# Patient Record
Sex: Female | Born: 1980
Health system: Southern US, Community
[De-identification: ages and names within clinical notes are randomized; demographics above are authoritative.]

## PROBLEM LIST (undated history)

## (undated) DIAGNOSIS — K602 Anal fissure, unspecified: Secondary | ICD-10-CM

## (undated) DIAGNOSIS — I82409 Acute embolism and thrombosis of unspecified deep veins of unspecified lower extremity: Secondary | ICD-10-CM

## (undated) DIAGNOSIS — Z923 Personal history of irradiation: Secondary | ICD-10-CM

## (undated) DIAGNOSIS — C50919 Malignant neoplasm of unspecified site of unspecified female breast: Secondary | ICD-10-CM

## (undated) DIAGNOSIS — F419 Anxiety disorder, unspecified: Secondary | ICD-10-CM

## (undated) HISTORY — DX: Anxiety disorder, unspecified: F41.9

## (undated) HISTORY — PX: NO PAST SURGERIES: SHX2092

## (undated) HISTORY — DX: Anal fissure, unspecified: K60.2

## (undated) HISTORY — PX: BREAST LUMPECTOMY: SHX2

## (undated) HISTORY — DX: Malignant neoplasm of unspecified site of unspecified female breast: C50.919

---

## 2002-05-19 ENCOUNTER — Emergency Department (HOSPITAL_COMMUNITY): Admission: EM | Admit: 2002-05-19 | Discharge: 2002-05-19 | Payer: Self-pay | Admitting: Emergency Medicine

## 2002-05-19 ENCOUNTER — Encounter: Payer: Self-pay | Admitting: Emergency Medicine

## 2006-05-26 ENCOUNTER — Emergency Department (HOSPITAL_COMMUNITY): Admission: EM | Admit: 2006-05-26 | Discharge: 2006-05-26 | Payer: Self-pay | Admitting: Emergency Medicine

## 2009-04-02 ENCOUNTER — Encounter: Admission: RE | Admit: 2009-04-02 | Discharge: 2009-04-02 | Payer: Self-pay | Admitting: Family Medicine

## 2010-11-07 ENCOUNTER — Ambulatory Visit (HOSPITAL_COMMUNITY)
Admission: EM | Admit: 2010-11-07 | Discharge: 2010-11-07 | Disposition: A | Discharge status: Home / Self Care | Payer: Federal, State, Local not specified - PPO | Source: Ambulatory Visit | Attending: Family Medicine | Admitting: Family Medicine

## 2010-11-07 DIAGNOSIS — M79609 Pain in unspecified limb: Secondary | ICD-10-CM | POA: Insufficient documentation

## 2010-11-09 ENCOUNTER — Encounter (HOSPITAL_COMMUNITY): Payer: Federal, State, Local not specified - PPO

## 2012-05-15 ENCOUNTER — Other Ambulatory Visit: Payer: Self-pay | Admitting: Internal Medicine

## 2012-05-20 ENCOUNTER — Ambulatory Visit (INDEPENDENT_AMBULATORY_CARE_PROVIDER_SITE_OTHER): Payer: Federal, State, Local not specified - PPO | Admitting: Family Medicine

## 2012-05-20 ENCOUNTER — Ambulatory Visit: Payer: Federal, State, Local not specified - PPO

## 2012-05-20 VITALS — BP 110/72 | HR 89 | Temp 97.9°F | Resp 17 | Ht 62.0 in | Wt 170.0 lb

## 2012-05-20 DIAGNOSIS — R05 Cough: Secondary | ICD-10-CM

## 2012-05-20 DIAGNOSIS — R059 Cough, unspecified: Secondary | ICD-10-CM

## 2012-05-20 DIAGNOSIS — J40 Bronchitis, not specified as acute or chronic: Secondary | ICD-10-CM

## 2012-05-20 DIAGNOSIS — R0602 Shortness of breath: Secondary | ICD-10-CM

## 2012-05-20 DIAGNOSIS — J040 Acute laryngitis: Secondary | ICD-10-CM

## 2012-05-20 LAB — POCT CBC
Granulocyte percent: 58.9 %G (ref 37–80)
HCT, POC: 44.6 % (ref 37.7–47.9)
Hemoglobin: 13.9 g/dL (ref 12.2–16.2)
Lymph, poc: 4.8 — AB (ref 0.6–3.4)
MCH, POC: 28.5 pg (ref 27–31.2)
MCHC: 31.2 g/dL — AB (ref 31.8–35.4)
MCV: 91.3 fL (ref 80–97)
MID (cbc): 0.8 (ref 0–0.9)
MPV: 8.1 fL (ref 0–99.8)
POC Granulocyte: 8 — AB (ref 2–6.9)
POC LYMPH PERCENT: 35 %L (ref 10–50)
POC MID %: 6.1 %M (ref 0–12)
Platelet Count, POC: 520 10*3/uL — AB (ref 142–424)
RBC: 4.88 M/uL (ref 4.04–5.48)
RDW, POC: 13.8 %
WBC: 13.6 10*3/uL — AB (ref 4.6–10.2)

## 2012-05-20 LAB — D-DIMER, QUANTITATIVE: D-Dimer, Quant: 0.27 ug/mL-FEU (ref 0.00–0.48)

## 2012-05-20 MED ORDER — AZITHROMYCIN 500 MG PO TABS: 500.0000 mg | ORAL_TABLET | Freq: Every day | ORAL | Status: DC

## 2012-05-20 MED ORDER — HYDROCODONE-HOMATROPINE 5-1.5 MG/5ML PO SYRP: ORAL_SOLUTION | ORAL | Status: DC

## 2012-05-20 MED ORDER — ALBUTEROL SULFATE HFA 108 (90 BASE) MCG/ACT IN AERS: 2.0000 | INHALATION_SPRAY | RESPIRATORY_TRACT | Status: DC | PRN

## 2012-05-20 NOTE — Progress Notes (Signed)
Patient ID: Cheyenne [name] MRN: [id number], DOB: 12-11-1980, 31 y.o. Date of Encounter: 05/20/2012, 10:31 AM  Primary Physician: No primary provider on file.  Chief Complaint: URI, cough, and nasal congestion for three days  HPI: 31 y.o. year old female with history below presents with three day history of nasal congestion, laryngitis, and cough. Afebrile, no chills through out. Patient has recently flown to and from [facility] with each flight lasting about 2.5 hours prior to the development of her above symptoms. While she was in [facility] walking around she noticed that she was short of breath. She did not seek evaluation for this while there. Since returning from her trip her shortness of breath has improved. Her cough is mostly nonproductive, but sounds productive. It is worse at night when she lays down. She denies any wheezing, dyspnea, chest pain, palpitations, tachycardia, or edema. She did lose her voice several days prior, then this improved, but has once again decreased. She states that her worst symptoms are her decreased voice and cough.   Of note, in April 2012 she was evaluated in our office for a "knot of her right ankle and calf." At this time she was found to have a D-dimer of 2.12, and an LE doppler that indicated superficial thrombosis in the greater saphenous vein, beginning at the ankle and extending into the popliteal fossa. She was treated with ASA 81 mg. She does mention to me for the past 2 weeks, which is prior to her plane travel, that her right calf has been sore. No injury or trauma. No swelling or erythema. She does take OCP daily. She does have a remote history of smoking tobacco in her late teens until age 69, when she would smoke socially. She has not smoked since. No known history of cancer or surgical procedures.  Reviewed hypercoagulation panel done in 2012 that did not show identifiable cause.     No past medical history on file.   Home Meds: Prior to  Admission medications   Medication Sig Start Date End Date Taking? Authorizing Provider  guaiFENesin (MUCINEX) 600 MG 12 hr tablet Take 1,200 mg by mouth 2 (two) times daily.   Yes Historical Provider, MD  guaiFENesin (ROBITUSSIN) 100 MG/5ML liquid Take 200 mg by mouth 3 (three) times daily as needed.   Yes Historical Provider, MD  OCP         Allergies: No Known Allergies  History   Social History  . Marital Status: Single    Spouse Name: N/A    Number of Children: N/A  . Years of Education: N/A   Occupational History  . Not on file.   Social History Main Topics  . Smoking status: Never Smoker   . Smokeless tobacco: Not on file  . Alcohol Use: Not on file  . Drug Use: Not on file  . Sexually Active: Not on file   Other Topics Concern  . Not on file   Social History Narrative  . No narrative on file     Review of Systems: Constitutional: negative for chills, fever, night sweats, weight changes, or fatigue  HEENT: negative for vision changes, or hearing loss Cardiovascular: negative for chest pain or palpitations Respiratory: negative for hemoptysis, dyspnea, or wheezing Abdominal: negative for abdominal pain, nausea, vomiting, diarrhea, or constipation Dermatological: negative for rash Neurologic: negative for headache, dizziness, or syncope All other systems reviewed and are otherwise negative with the exception to those above and in the HPI.   Physical  Exam: Blood pressure 110/72, pulse 89, temperature 97.9 F (36.6 C), temperature source Oral, resp. rate 17, height 5\' 2"  (1.575 m), weight 170 lb (77.111 kg), SpO2 99.00%., Body mass index is 31.09 kg/(m^2). General: Well developed, well nourished, in no acute distress. Head: Normocephalic, atraumatic, eyes without discharge, sclera non-icteric, nares are without discharge. Bilateral auditory canals clear, TM's are without perforation, pearly grey and translucent with reflective cone of light bilaterally. Bilateral  maxillary sinus TTP. Oral cavity moist, posterior pharynx without exudate, erythema, peritonsillar abscess, or post nasal drip. Uvula midline.  Neck: Supple. No thyromegaly. Full ROM. No lymphadenopathy. Lungs: Clear bilaterally to auscultation without wheezes, rales, or rhonchi. Breathing is unlabored. Heart: RRR with S1 S2. No murmurs, rubs, or gallops appreciated. Msk:  Strength and tone normal for age. Extremities/Skin: Bilateral calves 41 cm 7 cm below patella. Bilateral calves supple. No cording. No erythema. Warm and dry. No clubbing or cyanosis. No edema. No rashes or suspicious lesions. Neuro: Alert and oriented X 3. Moves all extremities spontaneously. Gait is normal. CNII-XII grossly in tact. Psych:  Responds to questions appropriately with a normal affect.   Labs: Results for orders placed in visit on 05/20/12  POCT CBC      Component Value Range   WBC 13.6 (*) 4.6 - 10.2 K/uL   Lymph, poc 4.8 (*) 0.6 - 3.4   POC LYMPH PERCENT 35.0  10 - 50 %L   MID (cbc) 0.8  0 - 0.9   POC MID % 6.1  0 - 12 %M   POC Granulocyte 8.0 (*) 2 - 6.9   Granulocyte percent 58.9  37 - 80 %G   RBC 4.88  4.04 - 5.48 M/uL   Hemoglobin 13.9  12.2 - 16.2 g/dL   HCT, POC 96.0  45.4 - 47.9 %   MCV 91.3  80 - 97 fL   MCH, POC 28.5  27 - 31.2 pg   MCHC 31.2 (*) 31.8 - 35.4 g/dL   RDW, POC 09.8     Platelet Count, POC 520 (*) 142 - 424 K/uL   MPV 8.1  0 - 99.8 fL   D-dimer STAT with call report.  [facility] reading (PRIMARY) by  Dr. Patsy Lager. Negative   ASSESSMENT AND PLAN:  31 y.o. year old female with bronchitis, cough, and laryngitis. Rule out for DVT/PE. 1. Bronchitis/cough/laryngitis -Azithromycin 500 mg 1 po daily #5 no RF -Hycodan #4oz 1 tsp po q 4-6 hours prn cough no RF SED -Proventil 2 puffs inhaled q 4-6 hours prn #1 RF 1 -Voice rest -Mucinex -Rest  -Fluids -RTC precautions  2. Rule out DVT/PE -Check STAT D-dimer -If elevated patient will need to go the the emergency room for lower  extremity ultrasound/dopplers and chest CT. -She has been given RTC/ER/911 precautions -STAT D-dimer 0.27, gave patient results. She will continue above treatment plan. She has been given 911 precautions.   3. Patient discussed with Dr. Patsy Lager. Agrees with the above assessment and plan.    Signed, Eula Listen, PA-C 05/20/2012 10:31 AM

## 2012-10-02 ENCOUNTER — Telehealth: Payer: Self-pay

## 2012-10-02 NOTE — Telephone Encounter (Signed)
The following email was submitted to your website from [facility] I was prescribed medication for my bronchitis last time I vistited your office.  The doctor had told me if I needed a refill then I could just call and he could do it without me having to come back in.  Well I'm having trouble with my bronchitis again can I get this refilled?  [facility] [address] if he need to call it in. My telephone number is [phone]. Thanks in Advance.  Date of birth: Nov 26, 1980

## 2012-10-02 NOTE — Telephone Encounter (Signed)
Patient advised to try Mucinex bid for the cough for couple days and if this is not helpful she is to return to clinic.

## 2012-10-04 ENCOUNTER — Ambulatory Visit (INDEPENDENT_AMBULATORY_CARE_PROVIDER_SITE_OTHER): Payer: Federal, State, Local not specified - PPO | Admitting: Physician Assistant

## 2012-10-04 VITALS — BP 122/72 | HR 91 | Temp 98.4°F | Resp 16 | Ht 61.5 in | Wt 180.8 lb

## 2012-10-04 DIAGNOSIS — R062 Wheezing: Secondary | ICD-10-CM

## 2012-10-04 DIAGNOSIS — J209 Acute bronchitis, unspecified: Secondary | ICD-10-CM

## 2012-10-04 MED ORDER — AZITHROMYCIN 250 MG PO TABS: ORAL_TABLET | ORAL | Status: DC

## 2012-10-04 MED ORDER — PREDNISONE 20 MG PO TABS: ORAL_TABLET | ORAL | Status: DC

## 2012-10-04 MED ORDER — FLUCONAZOLE 150 MG PO TABS: 150.0000 mg | ORAL_TABLET | Freq: Once | ORAL | Status: DC

## 2012-10-04 MED ORDER — ALBUTEROL SULFATE (2.5 MG/3ML) 0.083% IN NEBU
2.5000 mg | INHALATION_SOLUTION | Freq: Once | RESPIRATORY_TRACT | Status: AC
  Administered 2012-10-04: 2.5 mg via RESPIRATORY_TRACT

## 2012-10-04 NOTE — Progress Notes (Signed)
  Subjective:    Patient ID: Cheyenne [name], female    DOB: May 11, 1981, 32 y.o.   MRN: [id number]  HPI 32 year old female presents with 1 week history of cough, nasal congestion, wheezing, and some SOB during coughing fits.  States she has a history of "bronchitis" and uses albuterol as needed but has not diagnosis of asthma.  She has been using her albuterol 1-2 times daily. This does seem to help slightly.  Denies fever, chills, nausea, vomiting, sore throat, otalgia, headache, or dizziness.   She is otherwise healthy with no other concerns today.      Review of Systems  Constitutional: Negative for fever and chills.  HENT: Positive for congestion and rhinorrhea. Negative for ear pain, sore throat and sinus pressure.   Respiratory: Positive for cough and wheezing. Negative for chest tightness.   Cardiovascular: Negative for chest pain.  Gastrointestinal: Negative for nausea, vomiting and abdominal pain.  Allergic/Immunologic: Positive for environmental allergies (pollen).  Neurological: Negative for dizziness and headaches.       Objective:   Physical Exam  Constitutional: She is oriented to person, place, and time. She appears well-developed and well-nourished.  HENT:  Head: Normocephalic and atraumatic.  Right Ear: Hearing, tympanic membrane, external ear and ear canal normal.  Left Ear: Hearing, tympanic membrane, external ear and ear canal normal.  Mouth/Throat: Uvula is midline, oropharynx is clear and moist and mucous membranes are normal. No oropharyngeal exudate.  Eyes: Conjunctivae are normal.  Neck: Normal range of motion. Neck supple.  Cardiovascular: Normal rate, regular rhythm and normal heart sounds.   Pulmonary/Chest: Effort normal. She has wheezes (slight wheezes).  Lymphadenopathy:    She has no cervical adenopathy.  Neurological: She is alert and oriented to person, place, and time.  Psychiatric: She has a normal mood and affect. Her behavior is normal.  Judgment and thought content normal.          Assessment & Plan:  1. Acute bronchitis - Plan: predniSONE (DELTASONE) 20 MG tablet, azithromycin (ZITHROMAX) 250 MG tablet  -Will go ahead and cover with Zpack  -Zyrtec daily to help with drainage  2. Wheezing - Plan: albuterol (PROVENTIL) (2.5 MG/3ML) 0.083% nebulizer solution 2.5 mg  -Continue albuterol q4-6 hours prn wheezing  -If increased use of albuterol, RTC for further evaluation   -Follow up if symptoms worsen or fail to improve.

## 2013-04-09 ENCOUNTER — Other Ambulatory Visit: Payer: Self-pay | Admitting: Physician Assistant

## 2013-04-10 ENCOUNTER — Ambulatory Visit: Payer: Federal, State, Local not specified - PPO

## 2013-04-10 ENCOUNTER — Ambulatory Visit (INDEPENDENT_AMBULATORY_CARE_PROVIDER_SITE_OTHER): Payer: Federal, State, Local not specified - PPO | Admitting: Internal Medicine

## 2013-04-10 VITALS — BP 110/62 | HR 86 | Temp 98.4°F | Resp 18 | Ht 62.0 in | Wt 187.0 lb

## 2013-04-10 DIAGNOSIS — R0781 Pleurodynia: Secondary | ICD-10-CM

## 2013-04-10 DIAGNOSIS — R05 Cough: Secondary | ICD-10-CM

## 2013-04-10 DIAGNOSIS — R059 Cough, unspecified: Secondary | ICD-10-CM

## 2013-04-10 DIAGNOSIS — J209 Acute bronchitis, unspecified: Secondary | ICD-10-CM

## 2013-04-10 DIAGNOSIS — R071 Chest pain on breathing: Secondary | ICD-10-CM

## 2013-04-10 DIAGNOSIS — D72829 Elevated white blood cell count, unspecified: Secondary | ICD-10-CM

## 2013-04-10 LAB — POCT CBC
HCT, POC: 42.6 % (ref 37.7–47.9)
Hemoglobin: 13.7 g/dL (ref 12.2–16.2)
Lymph, poc: 5.3 — AB (ref 0.6–3.4)
MCH, POC: 28.8 pg (ref 27–31.2)
MCHC: 32.2 g/dL (ref 31.8–35.4)
MCV: 89.6 fL (ref 80–97)
MPV: 8.6 fL (ref 0–99.8)
RBC: 4.76 M/uL (ref 4.04–5.48)
WBC: 14.8 10*3/uL — AB (ref 4.6–10.2)

## 2013-04-10 MED ORDER — AZITHROMYCIN 250 MG PO TABS: ORAL_TABLET | ORAL | Status: DC

## 2013-04-10 MED ORDER — HYDROCODONE-ACETAMINOPHEN 7.5-325 MG/15ML PO SOLN: 5.0000 mL | Freq: Four times a day (QID) | ORAL | Status: DC | PRN

## 2013-04-10 MED ORDER — IBUPROFEN 600 MG PO TABS: 600.0000 mg | ORAL_TABLET | Freq: Three times a day (TID) | ORAL | Status: DC | PRN

## 2013-04-10 NOTE — Progress Notes (Signed)
  Subjective:    Patient ID: Cheyenne [name], female    DOB: 1981-06-14, 32 y.o.   MRN: [id number]  HPI    Review of Systems     Objective:   Physical Exam        Assessment & Plan:

## 2013-04-10 NOTE — Patient Instructions (Signed)
Pleurisy Pleurisy is an inflammation and swelling of the lining of the lungs. It usually is the result of an underlying infection or other disease. Because of this inflammation, it hurts to breathe. It is aggravated by coughing or deep breathing. The primary goal in treating pleurisy is to diagnose and treat the condition that caused it.  HOME CARE INSTRUCTIONS   Only take over-the-counter or prescription medicines for pain, discomfort, or fever as directed by your caregiver.  If medications which kill germs (antibiotics) were prescribed, take the entire course. Even if you are feeling better, you need to take them.  Use a cool mist vaporizer to help loosen secretions. This is so the secretions can be coughed up more easily. SEEK MEDICAL CARE IF:   Your pain is not controlled with medication or is increasing.  You have an increase inpus like (purulent) secretions brought up with coughing. SEEK IMMEDIATE MEDICAL CARE IF:   You have blue or dark lips, fingernails, or toenails.  You begin coughing up blood.  You have increased difficulty breathing.  You have continuing pain unrelieved by medicine or lasting more than 1 week.  You have pain that radiates into your neck, arms, or jaw.  You develop increased shortness of breath or wheezing.  You develop a fever, rash, vomiting, fainting, or other serious complaints. Document Released: 07/19/2005 Document Revised: 10/11/2011 Document Reviewed: 02/17/2007 [facility] Patient Information 2014 [place], [place]. Bronchitis Bronchitis is the body's way of reacting to injury and/or infection (inflammation) of the bronchi. Bronchi are the air tubes that extend from the windpipe into the lungs. If the inflammation becomes severe, it may cause shortness of breath. CAUSES  Inflammation may be caused by:  A virus.  Germs (bacteria).  Dust.  Allergens.  Pollutants and many other irritants. The cells lining the bronchial tree are covered with  tiny hairs (cilia). These constantly beat upward, away from the lungs, toward the mouth. This keeps the lungs free of pollutants. When these cells become too irritated and are unable to do their job, mucus begins to develop. This causes the characteristic cough of bronchitis. The cough clears the lungs when the cilia are unable to do their job. Without either of these protective mechanisms, the mucus would settle in the lungs. Then you would develop pneumonia. Smoking is a common cause of bronchitis and can contribute to pneumonia. Stopping this habit is the single most important thing you can do to help yourself. TREATMENT   Your caregiver may prescribe an antibiotic if the cough is caused by bacteria. Also, medicines that open up your airways make it easier to breathe. Your caregiver may also recommend or prescribe an expectorant. It will loosen the mucus to be coughed up. Only take over-the-counter or prescription medicines for pain, discomfort, or fever as directed by your caregiver.  Removing whatever causes the problem (smoking, for example) is critical to preventing the problem from getting worse.  Cough suppressants may be prescribed for relief of cough symptoms.  Inhaled medicines may be prescribed to help with symptoms now and to help prevent problems from returning.  For those with recurrent (chronic) bronchitis, there may be a need for steroid medicines. SEEK IMMEDIATE MEDICAL CARE IF:   During treatment, you develop more pus-like mucus (purulent sputum).  You have a fever.  Your baby is older than 3 months with a rectal temperature of 102 F (38.9 C) or higher.  Your baby is 52 months old or younger with a rectal temperature of 100.4 F (  38 C) or higher.  You become progressively more ill.  You have increased difficulty breathing, wheezing, or shortness of breath. It is necessary to seek immediate medical care if you are elderly or sick from any other disease. MAKE SURE YOU:     Understand these instructions.  Will watch your condition.  Will get help right away if you are not doing well or get worse. Document Released: 07/19/2005 Document Revised: 10/11/2011 Document Reviewed: 05/28/2008 [facility] Patient Information 2014 [place], [place].

## 2013-04-10 NOTE — Progress Notes (Signed)
  Subjective:    Patient ID: Cheyenne [name], female    DOB: [birth year], 32 y.o.   MRN: [id number]  HPI Patient presents with congestion in chest.  Complains of pain in left side of chest for two days. Coughing last night, minimal sputum No smoke, hx of bronchospasm. No sob, doe but pain with full breath.   Review of Systems     Objective:   Physical Exam  Vitals reviewed. Constitutional: She is oriented to person, place, and time. She appears well-developed and well-nourished. No distress.  HENT:  Head: Normocephalic.  Right Ear: External ear normal.  Left Ear: External ear normal.  Nose: Mucosal edema and rhinorrhea present.  Mouth/Throat: Oropharynx is clear and moist.  Eyes: EOM are normal.  Neck: Normal range of motion.  Cardiovascular: Normal rate and normal heart sounds.   Pulmonary/Chest: Effort normal and breath sounds normal. No respiratory distress. She has no wheezes. She has no rales. She exhibits tenderness.  Musculoskeletal: Normal range of motion.  Neurological: She is alert and oriented to person, place, and time. She exhibits normal muscle tone. Coordination normal.  Psychiatric: She has a normal mood and affect.     Results for orders placed in visit on 04/10/13  POCT CBC      Result Value Range   WBC 14.8 (*) 4.6 - 10.2 K/uL   Lymph, poc 5.3 (*) 0.6 - 3.4   POC LYMPH PERCENT 35.6  10 - 50 %L   MID (cbc) 0.9  0 - 0.9   POC MID % 6.1  0 - 12 %M   POC Granulocyte 8.6 (*) 2 - 6.9   Granulocyte percent 58.3  37 - 80 %G   RBC 4.76  4.04 - 5.48 M/uL   Hemoglobin 13.7  12.2 - 16.2 g/dL   HCT, POC 82.9  56.2 - 47.9 %   MCV 89.6  80 - 97 fL   MCH, POC 28.8  27 - 31.2 pg   MCHC 32.2  31.8 - 35.4 g/dL   RDW, POC 13.0     Platelet Count, POC 436 (*) 142 - 424 K/uL   MPV 8.6  0 - 99.8 fL   [facility] reading (PRIMARY) by  Dr Perrin Maltese cxr clear, no pneumothorax, no infiltrate.       Assessment & Plan:  Pleurisy/Bronchitis Zpak/Lortab elix/Ibuprofen

## 2013-04-16 ENCOUNTER — Telehealth: Payer: Self-pay

## 2013-04-16 NOTE — Telephone Encounter (Signed)
PT STATES SHE WOULD LIKE TO HAVE ANOTHER KIND OF MEDICATION CALLED IN FOR HER BRONCHITIS PLEASE CALL [phone]   [facility] [address]

## 2013-04-16 NOTE — Telephone Encounter (Signed)
Noted.  Forward to Dr. Perrin Maltese, [place] Childes.

## 2013-04-16 NOTE — Telephone Encounter (Signed)
Pleurisy/Bronchitis  Zpak/Lortab elix/Ibuprofen Called her, she states she is improving but still coughing. I have advised her the Zpack will continue to work, even though she has finished the course. I have encouraged her to continue the Mucinex. She has plenty of cough meds. She is encouraged to give this more time and to come in if worse. She agrees. To you FYI

## 2013-04-22 NOTE — Telephone Encounter (Signed)
rtc 

## 2013-09-17 ENCOUNTER — Ambulatory Visit (INDEPENDENT_AMBULATORY_CARE_PROVIDER_SITE_OTHER): Payer: Federal, State, Local not specified - PPO | Admitting: Physician Assistant

## 2013-09-17 VITALS — BP 100/62 | HR 98 | Temp 98.0°F | Resp 18 | Ht 61.0 in | Wt 182.0 lb

## 2013-09-17 DIAGNOSIS — R0602 Shortness of breath: Secondary | ICD-10-CM

## 2013-09-17 DIAGNOSIS — B9789 Other viral agents as the cause of diseases classified elsewhere: Principal | ICD-10-CM

## 2013-09-17 DIAGNOSIS — J40 Bronchitis, not specified as acute or chronic: Secondary | ICD-10-CM

## 2013-09-17 DIAGNOSIS — R05 Cough: Secondary | ICD-10-CM

## 2013-09-17 DIAGNOSIS — J069 Acute upper respiratory infection, unspecified: Secondary | ICD-10-CM

## 2013-09-17 DIAGNOSIS — R059 Cough, unspecified: Secondary | ICD-10-CM

## 2013-09-17 MED ORDER — ALBUTEROL SULFATE HFA 108 (90 BASE) MCG/ACT IN AERS: 2.0000 | INHALATION_SPRAY | RESPIRATORY_TRACT | Status: DC | PRN

## 2013-09-17 MED ORDER — HYDROCOD POLST-CHLORPHEN POLST 10-8 MG/5ML PO LQCR: 5.0000 mL | Freq: Two times a day (BID) | ORAL | Status: DC | PRN

## 2013-09-17 MED ORDER — MUCINEX DM MAXIMUM STRENGTH 60-1200 MG PO TB12: 1.0000 | ORAL_TABLET | Freq: Two times a day (BID) | ORAL | Status: DC

## 2013-09-17 NOTE — Progress Notes (Signed)
   Subjective:    Patient ID: Cheyenne [name], female    DOB: 21-Dec-1980, 33 y.o.   MRN: [id number]  HPI Patient reports a 4 day history of non-productive cough, chest congestion, shortness of breath, rhinorrhea, headache, chills, and fatigue.  She denies post nasal drip, fever, eye drainage, ear pain/pressure, sore throat, sinus pressure/pain.  She reports a history of bronchitis, usually gets it once or twice per year, last bout Sept 2014.  She denies a history of asthma but reports wheezing and SOB with respiratory illnesses, no problems otherwise.  She has been taking mucinex, hycodan and albuterol.  She has only taken the albuterol once and does not feel it helped.  She is taking the mucinex with sutafed.  She is out of hycodan   Review of Systems  Constitutional: Positive for chills and fatigue. Negative for fever and diaphoresis.  HENT: Positive for rhinorrhea. Negative for congestion, ear discharge, ear pain, postnasal drip, sinus pressure and sore throat.   Respiratory: Positive for cough (non-productive) and shortness of breath. Negative for chest tightness and wheezing.   Gastrointestinal: Negative.   Musculoskeletal: Negative for myalgias.  Neurological: Positive for headaches.      Objective:   Physical Exam  Constitutional: She is oriented to person, place, and time. She appears well-developed and well-nourished. No distress.  HENT:  Head: Normocephalic and atraumatic.  Right Ear: Tympanic membrane normal.  Left Ear: Tympanic membrane normal.  Nose: Mucosal edema and rhinorrhea present. Right sinus exhibits no maxillary sinus tenderness and no frontal sinus tenderness. Left sinus exhibits no maxillary sinus tenderness and no frontal sinus tenderness.  Mouth/Throat: Oropharynx is clear and moist.  Eyes: Conjunctivae and EOM are normal. Pupils are equal, round, and reactive to light.  Neck: Normal range of motion. Neck supple.  Cardiovascular: Normal rate, regular rhythm  and normal heart sounds.   Pulmonary/Chest: Effort normal and breath sounds normal. No accessory muscle usage. No respiratory distress. She has no wheezes. She has no rhonchi. She has no rales.  Lymphadenopathy:       Head (right side): No tonsillar, no preauricular, no posterior auricular and no occipital adenopathy present.       Head (left side): No tonsillar, no preauricular, no posterior auricular and no occipital adenopathy present.  Neurological: She is alert and oriented to person, place, and time.  Skin: Skin is warm and dry.  Psychiatric: She has a normal mood and affect. Her behavior is normal. Judgment and thought content normal.       Assessment & Plan:   1. Viral URI with cough -   - Dextromethorphan-Guaifenesin (MUCINEX DM MAXIMUM STRENGTH) 60-1200 MG TB12; Take 1 tablet by mouth every 12 (twelve) hours.  Dispense: 14 each; Refill: 1  2. Shortness of breath  - albuterol (PROVENTIL HFA;VENTOLIN HFA) 108 (90 BASE) MCG/ACT inhaler; Inhale 2 puffs into the lungs every 4 (four) hours as needed for wheezing.  Dispense: 1 Inhaler; Refill: 1  3. Cough  - chlorpheniramine-HYDROcodone (TUSSIONEX PENNKINETIC ER) 10-8 MG/5ML LQCR; Take 5 mLs by mouth every 12 (twelve) hours as needed for cough (cough).  Dispense: 70 mL; Refill: 0 - albuterol (PROVENTIL HFA;VENTOLIN HFA) 108 (90 BASE) MCG/ACT inhaler; Inhale 2 puffs into the lungs every 4 (four) hours as needed for wheezing.  Dispense: 1 Inhaler; Refill: 1  History of bronchitis and reactive airway disease.  No evidence at this time of infection.  Will treat symptomatically.

## 2013-09-18 ENCOUNTER — Telehealth: Payer: Self-pay

## 2013-09-18 NOTE — Progress Notes (Signed)
I was directly involved with the patient's care and agree with the physical, diagnosis and treatment plan.  She states that she is mainly here because she cannot sleep due to the cough and she ran out of her cough meds last pm.  I agree with symptomatic care unless her symptoms change and then she will contact the office for further instruction.

## 2013-09-18 NOTE — Telephone Encounter (Signed)
Patient needs a return to work note. States she would like to be at least through tomorrow.  [phone]

## 2013-09-19 NOTE — Telephone Encounter (Signed)
Spoke to patient, printed out of work note, mailing to home address per patient request.

## 2013-09-20 NOTE — Telephone Encounter (Signed)
Pt would now like for the note to be faxed. attn Mrs.Corinna Capra (time keeper will put this in her file) Fax: [phone]

## 2013-09-20 NOTE — Telephone Encounter (Signed)
Spoke to patient and informed her the work note had already been mailed to her home per her request,  she understood and said thank you.

## 2014-01-08 ENCOUNTER — Ambulatory Visit (INDEPENDENT_AMBULATORY_CARE_PROVIDER_SITE_OTHER): Payer: Federal, State, Local not specified - PPO | Admitting: Family Medicine

## 2014-01-08 VITALS — BP 120/72 | HR 100 | Temp 98.8°F | Resp 18 | Ht 61.5 in | Wt 187.6 lb

## 2014-01-08 DIAGNOSIS — L500 Allergic urticaria: Secondary | ICD-10-CM

## 2014-01-08 DIAGNOSIS — L259 Unspecified contact dermatitis, unspecified cause: Secondary | ICD-10-CM

## 2014-01-08 DIAGNOSIS — L239 Allergic contact dermatitis, unspecified cause: Secondary | ICD-10-CM

## 2014-01-08 MED ORDER — RANITIDINE HCL 150 MG PO TABS: 150.0000 mg | ORAL_TABLET | Freq: Two times a day (BID) | ORAL | Status: DC

## 2014-01-08 MED ORDER — HYDROXYZINE HCL 25 MG PO TABS: 12.5000 mg | ORAL_TABLET | Freq: Three times a day (TID) | ORAL | Status: DC | PRN

## 2014-01-08 MED ORDER — CETIRIZINE HCL 10 MG PO TABS: 10.0000 mg | ORAL_TABLET | Freq: Every day | ORAL | Status: DC

## 2014-01-08 NOTE — Progress Notes (Signed)
Subjective:   This chart was scribed for Delman Cheadle, MD, by Neta Ehlers, ED Scribe. This patient's care was started at 9:47 am.    Patient ID: Cheyenne [name], female    DOB: [birth year], 33 y.o.   MRN: [id number]  Chief Complaint  Patient presents with  . Dermatitis    arms and legs, redness and itching x 3 days    HPI  HPI Comments: Cheyenne [name] is a 33 y.o. female who presents to [facility] complaining of a rash which began approximately three days ago and which has been associated with itching. The rash is to her upper and lower extremities bilaterally. She has used both oral and topical Benadryl without resolution. The pt denies the rash to her back or neck. She reports she recently returned from a visit to [place], and she voices a h/o similar symptoms the last time she visited [place]; she stayed in a hotel in [place]. She used sun-block in [place] only to her extremities,  And she states she occasionally uses body wash to her extremities only.   Past Medical History  Diagnosis Date  . Anxiety     Current Outpatient Prescriptions on File Prior to Visit  Medication Sig Dispense Refill  . albuterol (PROVENTIL HFA;VENTOLIN HFA) 108 (90 BASE) MCG/ACT inhaler Inhale 2 puffs into the lungs every 4 (four) hours as needed for wheezing.  1 Inhaler  1  . guaiFENesin (MUCINEX) 600 MG 12 hr tablet Take 1,200 mg by mouth 2 (two) times daily.      Marland Kitchen ibuprofen (ADVIL,MOTRIN) 600 MG tablet Take 1 tablet (600 mg total) by mouth every 8 (eight) hours as needed for pain.  30 tablet  0  . PRESCRIPTION MEDICATION Birth control pill       No current facility-administered medications on file prior to visit.   No Known Allergies   Review of Systems  Constitutional: Negative for fever, chills and diaphoresis.  Musculoskeletal: Negative for arthralgias and joint swelling.  Skin: Positive for color change and rash. Negative for pallor and wound.  Allergic/Immunologic: Positive for environmental  allergies. Negative for food allergies and immunocompromised state.  Hematological: Negative for adenopathy. Does not bruise/bleed easily.  Psychiatric/Behavioral: Positive for sleep disturbance.   Triage Vitals: BP 120/72  Pulse 100  Temp(Src) 98.8 F (37.1 C) (Oral)  Resp 18  Ht 5' 1.5" (1.562 m)  Wt 187 lb 9.6 oz (85.095 kg)  BMI 34.88 kg/m2  SpO2 99%     Objective:   Physical Exam  Nursing note and vitals reviewed. Constitutional: She is oriented to person, place, and time. She appears well-developed and well-nourished. No distress.  HENT:  Head: Normocephalic and atraumatic.  Eyes: Conjunctivae and EOM are normal.  Neck: Normal range of motion. No tracheal deviation present.  Cardiovascular: Normal rate, regular rhythm, S1 normal, S2 normal, normal heart sounds and intact distal pulses.  Exam reveals no friction rub.   No murmur heard. Pulmonary/Chest: Effort normal and breath sounds normal. No respiratory distress. She has no decreased breath sounds. She has no wheezes. She has no rhonchi. She has no rales.  Musculoskeletal: Normal range of motion.  Neurological: She is alert and oriented to person, place, and time.  Skin: Skin is warm and dry. There is erythema.  Urticarial, erythematous papules most on left medial shin with multiple excoriations over anterior shins.  Blanching, macular, erythematous lesions, potentially urticarial, over right temple, chin, and neck.  Minimal lesions to anterior lower extremities.   Psychiatric: She has  a normal mood and affect. Her behavior is normal.      Assessment & Plan:   Informed pt to utilize below medications for a minimum of two weeks until the urticaria is completely resolved. Provided pt refills to start in case of recurrent. Also advised pt to use ice/topical Benadryl as needed.   Allergic urticaria  Allergic dermatitis  Meds ordered this encounter  Medications  . cetirizine (ZYRTEC) 10 MG tablet    Sig: Take 1 tablet  (10 mg total) by mouth daily.    Dispense:  30 tablet    Refill:  1  . ranitidine (ZANTAC) 150 MG tablet    Sig: Take 1 tablet (150 mg total) by mouth 2 (two) times daily.    Dispense:  60 tablet    Refill:  1  . hydrOXYzine (ATARAX/VISTARIL) 25 MG tablet    Sig: Take 0.5-1 tablets (12.5-25 mg total) by mouth every 8 (eight) hours as needed for itching.    Dispense:  60 tablet    Refill:  1    I personally performed the services described in this documentation, which was scribed in my presence. The recorded information has been reviewed and considered, and addended by me as needed.  Delman Cheadle, MD MPH

## 2014-01-08 NOTE — Patient Instructions (Signed)
Hives Hives are itchy, red, swollen areas of the skin. They can vary in size and location on your body. Hives can come and go for hours or several days (acute hives) or for several weeks (chronic hives). Hives do not spread from person to person (noncontagious). They may get worse with scratching, exercise, and emotional stress. CAUSES   Allergic reaction to food, additives, or drugs.  Infections, including the common cold.  Illness, such as vasculitis, lupus, or thyroid disease.  Exposure to sunlight, heat, or cold.  Exercise.  Stress.  Contact with chemicals. SYMPTOMS   Red or white swollen patches on the skin. The patches may change size, shape, and location quickly and repeatedly.  Itching.  Swelling of the hands, feet, and face. This may occur if hives develop deeper in the skin. DIAGNOSIS  Your caregiver can usually tell what is wrong by performing a physical exam. Skin or blood tests may also be done to determine the cause of your hives. In some cases, the cause cannot be determined. TREATMENT  Mild cases usually get better with medicines such as antihistamines. Severe cases may require an emergency epinephrine injection. If the cause of your hives is known, treatment includes avoiding that trigger.  HOME CARE INSTRUCTIONS   Avoid causes that trigger your hives.  Take antihistamines as directed by your caregiver to reduce the severity of your hives. Non-sedating or low-sedating antihistamines are usually recommended. Do not drive while taking an antihistamine.  Take any other medicines prescribed for itching as directed by your caregiver.  Wear loose-fitting clothing.  Keep all follow-up appointments as directed by your caregiver. SEEK MEDICAL CARE IF:   You have persistent or severe itching that is not relieved with medicine.  You have painful or swollen joints. SEEK IMMEDIATE MEDICAL CARE IF:   You have a fever.  Your tongue or lips are swollen.  You have  trouble breathing or swallowing.  You feel tightness in the throat or chest.  You have abdominal pain. These problems may be the first sign of a life-threatening allergic reaction. Call your local emergency services (911 in [place] MAKE SURE YOU:   Understand these instructions.  Will watch your condition.  Will get help right away if you are not doing well or get worse. Document Released: 07/19/2005 Document Revised: 01/18/2012 Document Reviewed: 10/12/2011 [facility] Patient Information 2014 [place]. Contact Dermatitis Contact dermatitis is a reaction to certain substances that touch the skin. Contact dermatitis can be either irritant contact dermatitis or allergic contact dermatitis. Irritant contact dermatitis does not require previous exposure to the substance for a reaction to occur.Allergic contact dermatitis only occurs if you have been exposed to the substance before. Upon a repeat exposure, your body reacts to the substance.  CAUSES  Many substances can cause contact dermatitis. Irritant dermatitis is most commonly caused by repeated exposure to mildly irritating substances, such as:  Makeup.  Soaps.  Detergents.  Bleaches.  Acids.  Metal salts, such as nickel. Allergic contact dermatitis is most commonly caused by exposure to:  Poisonous plants.  Chemicals (deodorants, shampoos).  Jewelry.  Latex.  Neomycin in triple antibiotic cream.  Preservatives in products, including clothing. SYMPTOMS  The area of skin that is exposed may develop:  Dryness or flaking.  Redness.  Cracks.  Itching.  Pain or a burning sensation.  Blisters. With allergic contact dermatitis, there may also be swelling in areas such as the eyelids, mouth, or genitals.  DIAGNOSIS  Your caregiver can usually tell what  the problem is by doing a physical exam. In cases where the cause is uncertain and an allergic contact dermatitis is suspected, a patch skin test may be  performed to help determine the cause of your dermatitis. TREATMENT Treatment includes protecting the skin from further contact with the irritating substance by avoiding that substance if possible. Barrier creams, powders, and gloves may be helpful. Your caregiver may also recommend:  Steroid creams or ointments applied 2 times daily. For best results, soak the rash area in cool water for 20 minutes. Then apply the medicine. Cover the area with a plastic wrap. You can store the steroid cream in the refrigerator for a "chilly" effect on your rash. That may decrease itching. Oral steroid medicines may be needed in more severe cases.  Antibiotics or antibacterial ointments if a skin infection is present.  Antihistamine lotion or an antihistamine taken by mouth to ease itching.  Lubricants to keep moisture in your skin.  Burow's solution to reduce redness and soreness or to dry a weeping rash. Mix one packet or tablet of solution in 2 cups cool water. Dip a clean washcloth in the mixture, wring it out a bit, and put it on the affected area. Leave the cloth in place for 30 minutes. Do this as often as possible throughout the day.  Taking several cornstarch or baking soda baths daily if the area is too large to cover with a washcloth. Harsh chemicals, such as alkalis or acids, can cause skin damage that is like a burn. You should flush your skin for 15 to 20 minutes with cold water after such an exposure. You should also seek immediate medical care after exposure. Bandages (dressings), antibiotics, and pain medicine may be needed for severely irritated skin.  HOME CARE INSTRUCTIONS  Avoid the substance that caused your reaction.  Keep the area of skin that is affected away from hot water, soap, sunlight, chemicals, acidic substances, or anything else that would irritate your skin.  Do not scratch the rash. Scratching may cause the rash to become infected.  You may take cool baths to help stop the  itching.  Only take over-the-counter or prescription medicines as directed by your caregiver.  See your caregiver for follow-up care as directed to make sure your skin is healing properly. SEEK MEDICAL CARE IF:   Your condition is not better after 3 days of treatment.  You seem to be getting worse.  You see signs of infection such as swelling, tenderness, redness, soreness, or warmth in the affected area.  You have any problems related to your medicines. Document Released: 07/16/2000 Document Revised: 10/11/2011 Document Reviewed: 12/22/2010 [facility] Patient Information 2014 [place], [place].

## 2014-03-14 ENCOUNTER — Ambulatory Visit (INDEPENDENT_AMBULATORY_CARE_PROVIDER_SITE_OTHER): Payer: Federal, State, Local not specified - PPO | Admitting: Family Medicine

## 2014-03-14 VITALS — BP 122/82 | HR 89 | Temp 98.5°F | Resp 18 | Ht 61.5 in | Wt 193.4 lb

## 2014-03-14 DIAGNOSIS — J0100 Acute maxillary sinusitis, unspecified: Secondary | ICD-10-CM

## 2014-03-14 DIAGNOSIS — J029 Acute pharyngitis, unspecified: Secondary | ICD-10-CM

## 2014-03-14 LAB — POCT RAPID STREP A (OFFICE): Rapid Strep A Screen: NEGATIVE

## 2014-03-14 MED ORDER — FLUCONAZOLE 150 MG PO TABS: 150.0000 mg | ORAL_TABLET | Freq: Once | ORAL | Status: DC

## 2014-03-14 MED ORDER — HYDROCOD POLST-CHLORPHEN POLST 10-8 MG/5ML PO LQCR: 5.0000 mL | Freq: Two times a day (BID) | ORAL | Status: DC | PRN

## 2014-03-14 MED ORDER — AMOXICILLIN 500 MG PO CAPS: 1000.0000 mg | ORAL_CAPSULE | Freq: Three times a day (TID) | ORAL | Status: DC

## 2014-03-14 MED ORDER — MAGIC MOUTHWASH W/LIDOCAINE: 10.0000 mL | ORAL | Status: DC | PRN

## 2014-03-14 NOTE — Progress Notes (Signed)
Subjective:  This chart was scribed for Delman Cheadle, MD by Ladene Artist, ED Scribe. The patient was seen in room 1. Patient's care was started at 10:22 AM.   Patient ID: Cheyenne [name], female    DOB: Nov 26, 1980, 33 y.o.   MRN: [id number]  Chief Complaint  Patient presents with  . Sore Throat    Says the back of her throat feels tight and swollen, since sunday night  . Cough   HPI HPI Comments  Cheyenne [name] is a 33 y.o. female who presents to the [facility] complaining of constant sore throat onset 4 days ago. Pt states that she returned from [place] 4 days ago before symptoms began. She reports associated dry cough, itchy throat, rhinorrhea, L upper gum pain prior to departure, HA that has resolved, R ear pain that has resolved, 1 episode of SOB 2 days ago. She denies fever, chills, sinus pressure, tinnitus. Pt reports sleep disturbances due to dental pain. She has tried Mucinex, Fast Relief sinus medication, Claritin with no relief. She has also tried hydrocodone cough syrup with temporary relief.   Dentist: [facility]; next appointment today, 03/14/14, at 4 PM for a follow-up  Past Medical History  Diagnosis Date  . Anxiety    Current Outpatient Prescriptions on File Prior to Visit  Medication Sig Dispense Refill  . albuterol (PROVENTIL HFA;VENTOLIN HFA) 108 (90 BASE) MCG/ACT inhaler Inhale 2 puffs into the lungs every 4 (four) hours as needed for wheezing.  1 Inhaler  1  . ibuprofen (ADVIL,MOTRIN) 600 MG tablet Take 1 tablet (600 mg total) by mouth every 8 (eight) hours as needed for pain.  30 tablet  0   No current facility-administered medications on file prior to visit.   No Known Allergies  Review of Systems  Constitutional: Negative for fever and chills.  HENT: Positive for dental problem, ear pain (resolved), rhinorrhea and sore throat. Negative for sinus pressure and tinnitus.   Respiratory: Positive for cough and shortness of breath.     Neurological: Positive for headaches (resolved).  Psychiatric/Behavioral: Positive for sleep disturbance.   Triage Vitals: BP 122/82  Pulse 89  Temp(Src) 98.5 F (36.9 C) (Oral)  Resp 18  Ht 5' 1.5" (1.562 m)  Wt 193 lb 6.4 oz (87.726 kg)  BMI 35.96 kg/m2  SpO2 98%    Objective:   Physical Exam  Nursing note and vitals reviewed. Constitutional: She is oriented to person, place, and time. She appears well-developed and well-nourished.  HENT:  Head: Normocephalic and atraumatic.  Nose: Mucosal edema present. Left sinus exhibits maxillary sinus tenderness (tender to palpation).  Mouth/Throat: No oropharyngeal exudate, posterior oropharyngeal edema or posterior oropharyngeal erythema.  Ears: TMs not visible due to cerumen Mouth/Throat: L tonsil at 2+, R at 1+  Eyes: Conjunctivae and EOM are normal.  Neck: Neck supple. No mass and no thyromegaly present.  Cardiovascular: Normal rate, regular rhythm, S1 normal, S2 normal and normal heart sounds.   Pulmonary/Chest: Effort normal and breath sounds normal.  Musculoskeletal: Normal range of motion.  Lymphadenopathy:       Head (right side): Submandibular adenopathy present. No preauricular and no posterior auricular adenopathy present.       Head (left side): Submandibular adenopathy present. No preauricular and no posterior auricular adenopathy present.    She has cervical adenopathy (anterior, bilaterally).       Right cervical: No posterior cervical adenopathy present.      Left cervical: No posterior cervical adenopathy  present.       Right: No supraclavicular adenopathy present.       Left: No supraclavicular adenopathy present.  Neurological: She is alert and oriented to person, place, and time.  Skin: Skin is warm and dry.  Psychiatric: She has a normal mood and affect. Her behavior is normal.   Results for orders placed in visit on 03/14/14  POCT RAPID STREP A (OFFICE)      Result Value Ref Range   Rapid Strep A Screen  Negative  Negative      Assessment & Plan:   Acute pharyngitis, unspecified pharyngitis type - Plan: POCT rapid strep A, Throat culture ([facility])  Acute maxillary sinusitis, recurrence not specified Does get yeast infections after antibiotics so requested a diflucan. Sent to pharmacy.   Meds ordered this encounter  Medications  . guaiFENesin (MUCINEX) 600 MG 12 hr tablet    Sig: Take by mouth 2 (two) times daily.  Marland Kitchen PRESCRIPTION MEDICATION    Sig: Birth control  . amoxicillin (AMOXIL) 500 MG capsule    Sig: Take 2 capsules (1,000 mg total) by mouth 3 (three) times daily.    Dispense:  60 capsule    Refill:  0  . chlorpheniramine-HYDROcodone (TUSSIONEX PENNKINETIC ER) 10-8 MG/5ML LQCR    Sig: Take 5 mLs by mouth every 12 (twelve) hours as needed.    Dispense:  120 mL    Refill:  0  . Alum & Mag Hydroxide-Simeth (MAGIC MOUTHWASH W/LIDOCAINE) SOLN    Sig: Take 10 mLs by mouth every 2 (two) hours as needed for mouth pain.    Dispense:  360 mL    Refill:  0    Use pharmacy formulary and mix 2:1 w/ viscous lidocaine, thanks.  . fluconazole (DIFLUCAN) 150 MG tablet    Sig: Take 1 tablet (150 mg total) by mouth once. After antibiotic course is complete. Repeat if needed if symptoms persist for >3d.    Dispense:  1 tablet    Refill:  1    I personally performed the services described in this documentation, which was scribed in my presence. The recorded information has been reviewed and considered, and addended by me as needed.  Delman Cheadle, MD MPH

## 2014-03-14 NOTE — Patient Instructions (Signed)

## 2014-03-16 LAB — CULTURE, GROUP A STREP: Organism ID, Bacteria: NORMAL

## 2014-03-18 ENCOUNTER — Telehealth: Payer: Self-pay

## 2014-03-18 NOTE — Telephone Encounter (Signed)
If the antibiotic is not working, that means that the infection is likely viral - an antibiotic will not help congestion at all - treats bacteria but will not help decrease the mucous at all. Esp since no bacteria was found in throat. If she is getting worse, may need a cortisone shot or a course of prednisone to improve swelling, pressure, congestion, pain but need to be seen in office for this since it can suppress the immune system.

## 2014-03-18 NOTE — Telephone Encounter (Signed)
Pt states her "sinus" area where the Dr. Lillette Boxer on her is swollen and sore. She states that she has more congestion in her nasal area. Her throat is better. She feels like she is getting worse instead of better. She will try Musinex today. She has not been using it since her visit here. She wants to see about using a different abx. She does not feel the one prescribed it working. Advised pt to give the abx a little time to work. She states she only has 5 days left and is not working. She wants something else called out.

## 2014-03-18 NOTE — Telephone Encounter (Signed)
Pt states she is not much better,L side of face hurts and is swollen,please advise   Best phone [address]

## 2014-03-19 NOTE — Telephone Encounter (Signed)
Spoke with pt. She will RTC 

## 2014-04-12 ENCOUNTER — Other Ambulatory Visit: Payer: Self-pay | Admitting: Obstetrics and Gynecology

## 2014-04-12 DIAGNOSIS — N63 Unspecified lump in unspecified breast: Secondary | ICD-10-CM

## 2014-04-16 ENCOUNTER — Ambulatory Visit
Admission: RE | Admit: 2014-04-16 | Discharge: 2014-04-16 | Disposition: A | Discharge status: Home / Self Care | Payer: Federal, State, Local not specified - PPO | Source: Ambulatory Visit | Attending: Obstetrics and Gynecology | Admitting: Obstetrics and Gynecology

## 2014-04-16 ENCOUNTER — Encounter (INDEPENDENT_AMBULATORY_CARE_PROVIDER_SITE_OTHER): Payer: Self-pay

## 2014-04-16 DIAGNOSIS — N63 Unspecified lump in unspecified breast: Secondary | ICD-10-CM

## 2014-08-15 ENCOUNTER — Ambulatory Visit (INDEPENDENT_AMBULATORY_CARE_PROVIDER_SITE_OTHER): Payer: Federal, State, Local not specified - PPO | Admitting: Family Medicine

## 2014-08-15 ENCOUNTER — Ambulatory Visit (INDEPENDENT_AMBULATORY_CARE_PROVIDER_SITE_OTHER): Payer: Federal, State, Local not specified - PPO

## 2014-08-15 VITALS — BP 112/60 | HR 79 | Temp 98.6°F | Resp 16 | Ht 61.5 in | Wt 197.0 lb

## 2014-08-15 DIAGNOSIS — S90122A Contusion of left lesser toe(s) without damage to nail, initial encounter: Secondary | ICD-10-CM

## 2014-08-15 DIAGNOSIS — M79675 Pain in left toe(s): Secondary | ICD-10-CM

## 2014-08-15 NOTE — Progress Notes (Signed)
[facility] [address], [place] [address] [phone]  Date:  08/15/2014   Name:  Cheyenne [name]   DOB:  1980-11-20   MRN:  [id number]  PCP:  No PCP Per Patient    Chief Complaint: Toe Pain   History of Present Illness:  Cheyenne [name] is a 34 y.o. very pleasant female patient who presents with the following:  She is here today with a right 4th toe injury that occurred a couple of days ago- she walked into a suitcase.  LMP- she uses continuouus OCP.    She is otherwise unhurt Wonders if the toe is broken- it is still sore  There are no active problems to display for this patient.   Past Medical History  Diagnosis Date  . Anxiety     History reviewed. No pertinent past surgical history.  History  Substance Use Topics  . Smoking status: Never Smoker   . Smokeless tobacco: Not on file  . Alcohol Use: 0.5 oz/week    1 drink(s) per week    Family History  Problem Relation Age of Onset  . Hypertension Mother     No Known Allergies  Medication list has been reviewed and updated.  Current Outpatient Prescriptions on File Prior to Visit  Medication Sig Dispense Refill  . albuterol (PROVENTIL HFA;VENTOLIN HFA) 108 (90 BASE) MCG/ACT inhaler Inhale 2 puffs into the lungs every 4 (four) hours as needed for wheezing. 1 Inhaler 1  . fluconazole (DIFLUCAN) 150 MG tablet Take 1 tablet (150 mg total) by mouth once. After antibiotic course is complete. Repeat if needed if symptoms persist for >3d. 1 tablet 1  . guaiFENesin (MUCINEX) 600 MG 12 hr tablet Take by mouth 2 (two) times daily.    Marland Kitchen ibuprofen (ADVIL,MOTRIN) 600 MG tablet Take 1 tablet (600 mg total) by mouth every 8 (eight) hours as needed for pain. 30 tablet 0  . PRESCRIPTION MEDICATION Birth control    . Alum & Mag Hydroxide-Simeth (MAGIC MOUTHWASH W/LIDOCAINE) SOLN Take 10 mLs by mouth every 2 (two) hours as needed for mouth pain. (Patient not taking: Reported on 08/15/2014) 360 mL 0  .  amoxicillin (AMOXIL) 500 MG capsule Take 2 capsules (1,000 mg total) by mouth 3 (three) times daily. (Patient not taking: Reported on 08/15/2014) 60 capsule 0  . chlorpheniramine-HYDROcodone (TUSSIONEX PENNKINETIC ER) 10-8 MG/5ML LQCR Take 5 mLs by mouth every 12 (twelve) hours as needed. (Patient not taking: Reported on 08/15/2014) 120 mL 0   No current facility-administered medications on file prior to visit.    Review of Systems:  As per HPI- otherwise negative.   Physical Examination: Filed Vitals:   08/15/14 1046  BP: 112/60  Pulse: 79  Temp: 98.6 F (37 C)  Resp: 16   Filed Vitals:   08/15/14 1046  Height: 5' 1.5" (1.562 m)  Weight: 197 lb (89.359 kg)   Body mass index is 36.62 kg/(m^2). Ideal Body Weight: Weight in (lb) to have BMI = 25: 134.2  GEN: WDWN, NAD, Non-toxic, A & O x 3, obese, looks well HEENT: Atraumatic, Normocephalic. Neck supple. No masses, No LAD. Ears and Nose: No external deformity. CV: RRR, No M/G/R. No JVD. No thrill. No extra heart sounds. PULM: CTA B, no wheezes, crackles, rhonchi. No retractions. No resp. distress. No accessory muscle use. EXTR: No c/c/e NEURO Normal gait.  PSYCH: Normally interactive. Conversant. Not depressed or anxious appearing.  Calm demeanor.  Left foot:left 4th toe is bruised and  tender. Mild tenderness over dorsum of foot.  Toe is NV intact  [facility] reading (PRIMARY) by  Dr. Lorelei Pont. Left foot: possible non- displaced fracture of distal phalanx of 4th toe.    LEFT FOOT - COMPLETE 3+ VIEW  COMPARISON: None.  FINDINGS: There is an oblique, nondisplaced fracture through the base of the fourth toe distal phalanx which involves the articular surface. There is no dislocation. Bone mineralization appears normal. Joint spaces are preserved. No radiopaque foreign body.  IMPRESSION: Nondisplaced fracture of the fourth toe distal phalanx.  Buddy taped 3rd and 4th toes  Assessment and Plan: Pain in toe of left foot -  Plan: DG Foot Complete Left  Contusion, toe, left, initial encounter - Plan: DG Foot Complete Left  Possible non- displaced fracture of distal left 4the toe.  Buddy taped and placed in post-op shoe.  I will give her a call with OR report.  Called around 4:45 pm- indeed there is a fracture.  No change to plan. She will follow- up if pain not better in the next 2-3 weeks- Sooner if worse.     Signed Lamar Blinks, MD

## 2014-08-15 NOTE — Patient Instructions (Signed)
I will call with your x-ray reportt.  There may be a mild break in your toe.  "buddy tape" it to the next toe and wear a supportive shoe or your velcro shoe Let me know if your toe is not less painful soon!

## 2014-12-24 ENCOUNTER — Ambulatory Visit (INDEPENDENT_AMBULATORY_CARE_PROVIDER_SITE_OTHER): Payer: Federal, State, Local not specified - PPO | Admitting: Physician Assistant

## 2014-12-24 ENCOUNTER — Encounter: Payer: Self-pay | Admitting: Physician Assistant

## 2014-12-24 VITALS — BP 112/80 | HR 87 | Temp 98.8°F | Resp 16 | Ht 61.25 in | Wt 210.0 lb

## 2014-12-24 DIAGNOSIS — Z23 Encounter for immunization: Secondary | ICD-10-CM | POA: Diagnosis not present

## 2014-12-24 DIAGNOSIS — Z1329 Encounter for screening for other suspected endocrine disorder: Secondary | ICD-10-CM | POA: Diagnosis not present

## 2014-12-24 DIAGNOSIS — Z1389 Encounter for screening for other disorder: Secondary | ICD-10-CM

## 2014-12-24 DIAGNOSIS — R0602 Shortness of breath: Secondary | ICD-10-CM | POA: Diagnosis not present

## 2014-12-24 DIAGNOSIS — R05 Cough: Secondary | ICD-10-CM

## 2014-12-24 DIAGNOSIS — Z0000 Encounter for general adult medical examination without abnormal findings: Secondary | ICD-10-CM | POA: Diagnosis not present

## 2014-12-24 DIAGNOSIS — F411 Generalized anxiety disorder: Secondary | ICD-10-CM | POA: Diagnosis not present

## 2014-12-24 DIAGNOSIS — R059 Cough, unspecified: Secondary | ICD-10-CM

## 2014-12-24 DIAGNOSIS — Z130 Encounter for screening for diseases of the blood and blood-forming organs and certain disorders involving the immune mechanism: Secondary | ICD-10-CM | POA: Diagnosis not present

## 2014-12-24 DIAGNOSIS — Z13220 Encounter for screening for lipoid disorders: Secondary | ICD-10-CM

## 2014-12-24 DIAGNOSIS — R062 Wheezing: Secondary | ICD-10-CM

## 2014-12-24 DIAGNOSIS — R3915 Urgency of urination: Secondary | ICD-10-CM

## 2014-12-24 LAB — POCT URINALYSIS DIPSTICK
BILIRUBIN UA: NEGATIVE
Glucose, UA: NEGATIVE
Ketones, UA: NEGATIVE
Leukocytes, UA: NEGATIVE
Nitrite, UA: NEGATIVE
PROTEIN UA: 100
Spec Grav, UA: 1.025
UROBILINOGEN UA: 0.2
pH, UA: 6

## 2014-12-24 LAB — POCT UA - MICROSCOPIC ONLY
Casts, Ur, LPF, POC: NEGATIVE
Crystals, Ur, HPF, POC: NEGATIVE
Epithelial cells, urine per micros: NEGATIVE
Mucus, UA: NEGATIVE
Yeast, UA: NEGATIVE

## 2014-12-24 LAB — COMPREHENSIVE METABOLIC PANEL
ALBUMIN: 3.8 g/dL (ref 3.5–5.2)
ALT: 18 U/L (ref 0–35)
AST: 16 U/L (ref 0–37)
Alkaline Phosphatase: 62 U/L (ref 39–117)
BUN: 15 mg/dL (ref 6–23)
CHLORIDE: 105 meq/L (ref 96–112)
CO2: 23 mEq/L (ref 19–32)
CREATININE: 0.83 mg/dL (ref 0.50–1.10)
Calcium: 8.6 mg/dL (ref 8.4–10.5)
GLUCOSE: 96 mg/dL (ref 70–99)
Potassium: 4.3 mEq/L (ref 3.5–5.3)
SODIUM: 138 meq/L (ref 135–145)
TOTAL PROTEIN: 6.6 g/dL (ref 6.0–8.3)
Total Bilirubin: 0.4 mg/dL (ref 0.2–1.2)

## 2014-12-24 LAB — CBC
HCT: 39.2 % (ref 36.0–46.0)
HEMOGLOBIN: 12.6 g/dL (ref 12.0–15.0)
MCH: 27.3 pg (ref 26.0–34.0)
MCHC: 32.1 g/dL (ref 30.0–36.0)
MCV: 84.8 fL (ref 78.0–100.0)
MPV: 9.3 fL (ref 8.6–12.4)
PLATELETS: 433 10*3/uL — AB (ref 150–400)
RBC: 4.62 MIL/uL (ref 3.87–5.11)
RDW: 14 % (ref 11.5–15.5)
WBC: 11 10*3/uL — ABNORMAL HIGH (ref 4.0–10.5)

## 2014-12-24 LAB — LIPID PANEL
CHOLESTEROL: 192 mg/dL (ref 0–200)
HDL: 46 mg/dL (ref >=46)
LDL Cholesterol: 125 mg/dL — ABNORMAL HIGH (ref 0–99)
Total CHOL/HDL Ratio: 4.2 Ratio
Triglycerides: 104 mg/dL (ref <150)
VLDL: 21 mg/dL (ref 0–40)

## 2014-12-24 LAB — TSH: TSH: 2.501 u[IU]/mL (ref 0.350–4.500)

## 2014-12-24 MED ORDER — ALPRAZOLAM 0.5 MG PO TABS: 0.5000 mg | ORAL_TABLET | Freq: Every evening | ORAL | Status: DC | PRN

## 2014-12-24 MED ORDER — ALBUTEROL SULFATE HFA 108 (90 BASE) MCG/ACT IN AERS: 2.0000 | INHALATION_SPRAY | RESPIRATORY_TRACT | Status: DC | PRN

## 2014-12-24 NOTE — Progress Notes (Signed)
   Subjective:    Patient ID: Cheyenne [name], female    DOB: 07-19-1981, 34 y.o.   MRN: [id number]  HPI    Review of Systems  Constitutional: Positive for fatigue.  HENT: Negative.   Eyes: Positive for redness and itching.  Respiratory: Positive for shortness of breath.   Cardiovascular: Negative.   Gastrointestinal: Negative.   Endocrine: Negative.   Genitourinary: Negative.   Musculoskeletal: Negative.   Skin: Negative.   Allergic/Immunologic: Positive for environmental allergies.  Neurological: Negative.   Hematological: Negative.   Psychiatric/Behavioral: Negative.        Objective:   Physical Exam        Assessment & Plan:

## 2014-12-24 NOTE — Patient Instructions (Signed)
We are checking labs today and will be in touch with you with those results.  You received a TDap vaccine today. We checked your lung function today while you're not sick to establish a baseline.  We flushed out your left ear today.  I've refilled your xanax and albuterol today. Remember there are better benzo medications that are better for the long term than xanax.  Please come back to see Cheyenne [name] with any other issues.   Health Maintenance Adopting a healthy lifestyle and getting preventive care can go a long way to promote health and wellness. Talk with your health care provider about what schedule of regular examinations is right for you. This is a good chance for you to check in with your provider about disease prevention and staying healthy. In between checkups, there are plenty of things you can do on your own. Experts have done a lot of research about which lifestyle changes and preventive measures are most likely to keep you healthy. Ask your health care provider for more information. WEIGHT AND DIET  Eat a healthy diet  Be sure to include plenty of vegetables, fruits, low-fat dairy products, and lean protein.  Do not eat a lot of foods high in solid fats, added sugars, or salt.  Get regular exercise. This is one of the most important things you can do for your health.  Most adults should exercise for at least 150 minutes each week. The exercise should increase your heart rate and make you sweat (moderate-intensity exercise).  Most adults should also do strengthening exercises at least twice a week. This is in addition to the moderate-intensity exercise.  Maintain a healthy weight  Body mass index (BMI) is a measurement that can be used to identify possible weight problems. It estimates body fat based on height and weight. Your health care provider can help determine your BMI and help you achieve or maintain a healthy weight.  For females 40 years of age and older:   A BMI below  18.5 is considered underweight.  A BMI of 18.5 to 24.9 is normal.  A BMI of 25 to 29.9 is considered overweight.  A BMI of 30 and above is considered obese.  Watch levels of cholesterol and blood lipids  You should start having your blood tested for lipids and cholesterol at 33 years of age, then have this test every 5 years.  You may need to have your cholesterol levels checked more often if:  Your lipid or cholesterol levels are high.  You are older than 33 years of age.  You are at high risk for heart disease.  CANCER SCREENING   Lung Cancer  Lung cancer screening is recommended for adults 18-68 years old who are at high risk for lung cancer because of a history of smoking.  A yearly low-dose CT scan of the lungs is recommended for people who:  Currently smoke.  Have quit within the past 15 years.  Have at least a 30-pack-year history of smoking. A pack year is smoking an average of one pack of cigarettes a day for 1 year.  Yearly screening should continue until it has been 15 years since you quit.  Yearly screening should stop if you develop a health problem that would prevent you from having lung cancer treatment.  Breast Cancer  Practice breast self-awareness. This means understanding how your breasts normally appear and feel.  It also means doing regular breast self-exams. Let your health care provider know about any changes,  no matter how small.  If you are in your 20s or 30s, you should have a clinical breast exam (CBE) by a health care provider every 1-3 years as part of a regular health exam.  If you are 87 or older, have a CBE every year. Also consider having a breast X-ray (mammogram) every year.  If you have a family history of breast cancer, talk to your health care provider about genetic screening.  If you are at high risk for breast cancer, talk to your health care provider about having an MRI and a mammogram every year.  Breast cancer gene (BRCA)  assessment is recommended for women who have family members with BRCA-related cancers. BRCA-related cancers include:  Breast.  Ovarian.  Tubal.  Peritoneal cancers.  Results of the assessment will determine the need for genetic counseling and BRCA1 and BRCA2 testing. Cervical Cancer Routine pelvic examinations to screen for cervical cancer are no longer recommended for nonpregnant women who are considered low risk for cancer of the pelvic organs (ovaries, uterus, and vagina) and who do not have symptoms. A pelvic examination may be necessary if you have symptoms including those associated with pelvic infections. Ask your health care provider if a screening pelvic exam is right for you.   The Pap test is the screening test for cervical cancer for women who are considered at risk.  If you had a hysterectomy for a problem that was not cancer or a condition that could lead to cancer, then you no longer need Pap tests.  If you are older than 65 years, and you have had normal Pap tests for the past 10 years, you no longer need to have Pap tests.  If you have had past treatment for cervical cancer or a condition that could lead to cancer, you need Pap tests and screening for cancer for at least 20 years after your treatment.  If you no longer get a Pap test, assess your risk factors if they change (such as having a new sexual partner). This can affect whether you should start being screened again.  Some women have medical problems that increase their chance of getting cervical cancer. If this is the case for you, your health care provider may recommend more frequent screening and Pap tests.  The human papillomavirus (HPV) test is another test that may be used for cervical cancer screening. The HPV test looks for the virus that can cause cell changes in the cervix. The cells collected during the Pap test can be tested for HPV.  The HPV test can be used to screen women 64 years of age and older.  Getting tested for HPV can extend the interval between normal Pap tests from three to five years.  An HPV test also should be used to screen women of any age who have unclear Pap test results.  After 33 years of age, women should have HPV testing as often as Pap tests.  Colorectal Cancer  This type of cancer can be detected and often prevented.  Routine colorectal cancer screening usually begins at 33 years of age and continues through 33 years of age.  Your health care provider may recommend screening at an earlier age if you have risk factors for colon cancer.  Your health care provider may also recommend using home test kits to check for hidden blood in the stool.  A small camera at the end of a tube can be used to examine your colon directly (sigmoidoscopy or colonoscopy). This  is done to check for the earliest forms of colorectal cancer.  Routine screening usually begins at age 9.  Direct examination of the colon should be repeated every 5-10 years through 33 years of age. However, you may need to be screened more often if early forms of precancerous polyps or small growths are found. Skin Cancer  Check your skin from head to toe regularly.  Tell your health care provider about any new moles or changes in moles, especially if there is a change in a mole's shape or color.  Also tell your health care provider if you have a mole that is larger than the size of a pencil eraser.  Always use sunscreen. Apply sunscreen liberally and repeatedly throughout the day.  Protect yourself by wearing long sleeves, pants, a wide-brimmed hat, and sunglasses whenever you are outside. HEART DISEASE, DIABETES, AND HIGH BLOOD PRESSURE   Have your blood pressure checked at least every 1-2 years. High blood pressure causes heart disease and increases the risk of stroke.  If you are between 19 years and 34 years old, ask your health care provider if you should take aspirin to prevent  strokes.  Have regular diabetes screenings. This involves taking a blood sample to check your fasting blood sugar level.  If you are at a normal weight and have a low risk for diabetes, have this test once every three years after 33 years of age.  If you are overweight and have a high risk for diabetes, consider being tested at a younger age or more often. PREVENTING INFECTION  Hepatitis B  If you have a higher risk for hepatitis B, you should be screened for this virus. You are considered at high risk for hepatitis B if:  You were born in a country where hepatitis B is common. Ask your health care provider which countries are considered high risk.  Your parents were born in a high-risk country, and you have not been immunized against hepatitis B (hepatitis B vaccine).  You have HIV or AIDS.  You use needles to inject street drugs.  You live with someone who has hepatitis B.  You have had sex with someone who has hepatitis B.  You get hemodialysis treatment.  You take certain medicines for conditions, including cancer, organ transplantation, and autoimmune conditions. Hepatitis C  Blood testing is recommended for:  Everyone born from [birth year] through 1964.  Anyone with known risk factors for hepatitis C. Sexually transmitted infections (STIs)  You should be screened for sexually transmitted infections (STIs) including gonorrhea and chlamydia if:  You are sexually active and are younger than 33 years of age.  You are older than 33 years of age and your health care provider tells you that you are at risk for this type of infection.  Your sexual activity has changed since you were last screened and you are at an increased risk for chlamydia or gonorrhea. Ask your health care provider if you are at risk.  If you do not have HIV, but are at risk, it may be recommended that you take a prescription medicine daily to prevent HIV infection. This is called pre-exposure prophylaxis  (PrEP). You are considered at risk if:  You are sexually active and do not regularly use condoms or know the HIV status of your partner(s).  You take drugs by injection.  You are sexually active with a partner who has HIV. Talk with your health care provider about whether you are at high risk of being  infected with HIV. If you choose to begin PrEP, you should first be tested for HIV. You should then be tested every 3 months for as long as you are taking PrEP.  PREGNANCY   If you are premenopausal and you may become pregnant, ask your health care provider about preconception counseling.  If you may become pregnant, take 400 to 800 micrograms (mcg) of folic acid every day.  If you want to prevent pregnancy, talk to your health care provider about birth control (contraception). OSTEOPOROSIS AND MENOPAUSE   Osteoporosis is a disease in which the bones lose minerals and strength with aging. This can result in serious bone fractures. Your risk for osteoporosis can be identified using a bone density scan.  If you are 53 years of age or older, or if you are at risk for osteoporosis and fractures, ask your health care provider if you should be screened.  Ask your health care provider whether you should take a calcium or vitamin D supplement to lower your risk for osteoporosis.  Menopause may have certain physical symptoms and risks.  Hormone replacement therapy may reduce some of these symptoms and risks. Talk to your health care provider about whether hormone replacement therapy is right for you.  HOME CARE INSTRUCTIONS   Schedule regular health, dental, and eye exams.  Stay current with your immunizations.   Do not use any tobacco products including cigarettes, chewing tobacco, or electronic cigarettes.  If you are pregnant, do not drink alcohol.  If you are breastfeeding, limit how much and how often you drink alcohol.  Limit alcohol intake to no more than 1 drink per day for  nonpregnant women. One drink equals 12 ounces of beer, 5 ounces of wine, or 1 ounces of hard liquor.  Do not use street drugs.  Do not share needles.  Ask your health care provider for help if you need support or information about quitting drugs.  Tell your health care provider if you often feel depressed.  Tell your health care provider if you have ever been abused or do not feel safe at home. Document Released: 02/01/2011 Document Revised: 12/03/2013 Document Reviewed: 06/20/2013 [facility] Patient Information 2015 [place], [place]. This information is not intended to replace advice given to you by your health care provider. Make sure you discuss any questions you have with your health care provider.

## 2014-12-24 NOTE — Progress Notes (Signed)
Subjective:    Patient ID: Cheyenne [name], female    DOB: 1980/10/14, 34 y.o.   MRN: [id number]  Chief Complaint  Patient presents with  . Annual Exam    no pap  . Medication Refill    Albuterol inh   There are no active problems to display for this patient.  Prior to Admission medications   Medication Sig Start Date End Date Taking? Authorizing Provider  albuterol (PROVENTIL HFA;VENTOLIN HFA) 108 (90 BASE) MCG/ACT inhaler Inhale 2 puffs into the lungs every 4 (four) hours as needed for wheezing. 12/24/14  Yes Raelyn Ensign, PA  ALPRAZolam (XANAX) 0.5 MG tablet Take 1 tablet (0.5 mg total) by mouth at bedtime as needed for anxiety. 12/24/14  Yes Luisantonio Adinolfi, PA  desonide (DESOWEN) 0.05 % lotion Apply topically 2 (two) times daily.   Yes Historical Provider, MD  guaiFENesin (MUCINEX) 600 MG 12 hr tablet Take by mouth 2 (two) times daily.   Yes Historical Provider, MD  ibuprofen (ADVIL,MOTRIN) 600 MG tablet Take 1 tablet (600 mg total) by mouth every 8 (eight) hours as needed for pain. 04/10/13  Yes Jonita Albee, MD  Norethindrone-Eth Estradiol (NORTREL 0.5/35, 28, PO) Take by mouth.   Yes Historical Provider, MD   Medications, allergies, past medical history, surgical history, family history, social history and problem list reviewed and updated.  HPI  3 yof presents for cpe.   Dentist: Sees them 1-2 x year. Eye: Sees them yearly, wears eye glasses. Diet: No specific diet. Exercise: None. Due to lack of interest. Drugs, cigs: None. Alcohol: Socially.   Denies any issues, pains, complaints. She does mention slight increase urinary urgency while at work but thinks this may be due to her drinking lots of water/caffeine and not getting many bathroom breaks. Denies dysuria, freq.   Has hx freq bronchitis infxns. Has never been a smoker. Works indoors. She uses albuterol occasionally when has bronchitis attacks. Approx every 1-2 months. She does not have a known hx asthma. Does not feel  like she wheezes btwn bronchitis episodes.   She asks for xanax refill. Has been on for anxiety for several yrs. Initially prescribed from psych yrs ago. Has been being refilled by her gyn. Most of her anxiety is through her job at [employer]. Denies anxiety most days of the week, denies it affecting her daily life.   Unsure of last tdap vaccine.  Not due for pap, had normal pap earlier this year with her gyn. Had abn mass left breast with gyn --> normal mammo earlier this year per pt. Pt states she had neg std screening at gyn earlier this year, declines today.   Review of Systems Fatigue: states this has been present past few days since working in yard Eye redness, eye itching, seasonal allergies: Every year around this time. Takes claritin daily.  Otherwise negative per ros sheet.      Objective:   Physical Exam  Constitutional: She is oriented to person, place, and time. She appears well-developed and well-nourished.  Non-toxic appearance. She does not have a sickly appearance. She does not appear ill. No distress.  BP 112/80 mmHg  Pulse 87  Temp(Src) 98.8 F (37.1 C) (Oral)  Resp 16  Ht 5' 1.25" (1.556 m)  Wt 210 lb (95.255 kg)  BMI 39.34 kg/m2  SpO2 98%   HENT:  Right Ear: Tympanic membrane normal.  Left Ear: Tympanic membrane normal.  Mouth/Throat: Uvula is midline and oropharynx is clear and moist.  Left ear canal impacted. Flushed in clinic --> TM normal afterward.   Eyes: Conjunctivae and EOM are normal. Pupils are equal, round, and reactive to light.  Neck: Trachea normal and normal range of motion. Carotid bruit is not present. No thyroid mass and no thyromegaly present.  Cardiovascular: Normal rate, regular rhythm and normal heart sounds.  Exam reveals no gallop.   No murmur heard. Pulses:      Dorsalis pedis pulses are 2+ on the right side, and 2+ on the left side.       Posterior tibial pulses are 2+ on the right side, and 2+ on the left side.    Pulmonary/Chest: Effort normal and breath sounds normal. She has no decreased breath sounds. She has no wheezes. She has no rhonchi. She has no rales.  Abdominal: Soft. Normal appearance and bowel sounds are normal. There is no hepatosplenomegaly. There is no tenderness. There is no rigidity, no rebound, no guarding, no CVA tenderness, no tenderness at McBurney's point and negative Murphy's sign.  Musculoskeletal: Normal range of motion.  Lymphadenopathy:       Head (right side): No submental, no submandibular and no tonsillar adenopathy present.       Head (left side): No submental, no submandibular and no tonsillar adenopathy present.    She has no cervical adenopathy.  Neurological: She is alert and oriented to person, place, and time. She has normal strength. No cranial nerve deficit or sensory deficit. She displays a negative Romberg sign.  Skin: Skin is warm and dry. No rash noted.  Psychiatric: She has a normal mood and affect. Her speech is normal and behavior is normal.   Spirometry in clinic: FVC 101% pred FEV1 102% pred FEV1/FVC 101%     Assessment & Plan:   16 yof presents for cpe.   Annual physical exam --normal exam, vitals --no pap due today, no breast exam as she had at gyn few months ago followed by normal mammo  Wheezing - Plan: PFT PULM FXN SPIROMETRY (94010), albuterol (PROVENTIL HFA;VENTOLIN HFA) 108 (90 BASE) MCG/ACT inhaler --hx freq bronchitis infxns, hx intermittent albuterol usage, no formal diagnosis of asthma --lung sounds normal in clinic but she feels she has been wheezing at home past few days --spirometry normal today, can serve as baseline for future --albuterol refilled  Anxiety state - Plan: ALPRAZolam (XANAX) 0.5 MG tablet --refilled xanax for pt --discussed option of switching to klonopin in future, she is agreeable to this  Need for Tdap vaccination - Plan: Tdap vaccine greater than or equal to 7yo IM  Screening for deficiency anemia -  Plan: CBC  Urinary urgency - Plan: POCT urinalysis dipstick, POCT UA - Microscopic Only  Screening for nephropathy - Plan: Comprehensive metabolic panel  Screening for thyroid disorder - Plan: TSH  Screening for hypercholesterolemia - Plan: Lipid panel   Donnajean Lopes, PA-C Physician Assistant-Certified [facility]  12/24/2014 9:37 PM

## 2014-12-28 ENCOUNTER — Encounter: Payer: Self-pay | Admitting: Physician Assistant

## 2014-12-28 DIAGNOSIS — J309 Allergic rhinitis, unspecified: Secondary | ICD-10-CM

## 2014-12-30 MED ORDER — CETIRIZINE HCL 10 MG PO TABS: 10.0000 mg | ORAL_TABLET | Freq: Every day | ORAL | Status: DC

## 2015-01-03 ENCOUNTER — Encounter: Payer: Self-pay | Admitting: Physician Assistant

## 2015-04-22 ENCOUNTER — Ambulatory Visit (INDEPENDENT_AMBULATORY_CARE_PROVIDER_SITE_OTHER): Payer: Federal, State, Local not specified - PPO | Admitting: Family Medicine

## 2015-04-22 VITALS — BP 134/86 | HR 98 | Temp 98.0°F | Resp 17 | Ht <= 58 in | Wt 209.0 lb

## 2015-04-22 DIAGNOSIS — J029 Acute pharyngitis, unspecified: Secondary | ICD-10-CM

## 2015-04-22 LAB — POCT RAPID STREP A (OFFICE): RAPID STREP A SCREEN: NEGATIVE

## 2015-04-22 MED ORDER — FIRST-DUKES MOUTHWASH MT SUSP: 5.0000 mL | OROMUCOSAL | Status: DC | PRN

## 2015-04-22 NOTE — Patient Instructions (Signed)
Your rapid strep test is negative, we will do a culture to double check You can take ibuprofen 400-600 mg every 8 hours for pain   Pharyngitis Pharyngitis is redness, pain, and swelling (inflammation) of your pharynx.  CAUSES  Pharyngitis is usually caused by infection. Most of the time, these infections are from viruses (viral) and are part of a cold. However, sometimes pharyngitis is caused by bacteria (bacterial). Pharyngitis can also be caused by allergies. Viral pharyngitis may be spread from person to person by coughing, sneezing, and personal items or utensils (cups, forks, spoons, toothbrushes). Bacterial pharyngitis may be spread from person to person by more intimate contact, such as kissing.  SIGNS AND SYMPTOMS  Symptoms of pharyngitis include:   Sore throat.   Tiredness (fatigue).   Low-grade fever.   Headache.  Joint pain and muscle aches.  Skin rashes.  Swollen lymph nodes.  Plaque-like film on throat or tonsils (often seen with bacterial pharyngitis). DIAGNOSIS  Your health care provider will ask you questions about your illness and your symptoms. Your medical history, along with a physical exam, is often all that is needed to diagnose pharyngitis. Sometimes, a rapid strep test is done. Other lab tests may also be done, depending on the suspected cause.  TREATMENT  Viral pharyngitis will usually get better in 3-4 days without the use of medicine. Bacterial pharyngitis is treated with medicines that kill germs (antibiotics).  HOME CARE INSTRUCTIONS   Drink enough water and fluids to keep your urine clear or pale yellow.   Only take over-the-counter or prescription medicines as directed by your health care provider:   If you are prescribed antibiotics, make sure you finish them even if you start to feel better.   Do not take aspirin.   Get lots of rest.   Gargle with 8 oz of salt water ( tsp of salt per 1 qt of water) as often as every 1-2 hours to  soothe your throat.   Throat lozenges (if you are not at risk for choking) or sprays may be used to soothe your throat. SEEK MEDICAL CARE IF:   You have large, tender lumps in your neck.  You have a rash.  You cough up green, yellow-brown, or bloody spit. SEEK IMMEDIATE MEDICAL CARE IF:   Your neck becomes stiff.  You drool or are unable to swallow liquids.  You vomit or are unable to keep medicines or liquids down.  You have severe pain that does not go away with the use of recommended medicines.  You have trouble breathing (not caused by a stuffy nose). MAKE SURE YOU:   Understand these instructions.  Will watch your condition.  Will get help right away if you are not doing well or get worse. Document Released: 07/19/2005 Document Revised: 05/09/2013 Document Reviewed: 03/26/2013 [facility] Patient Information 2015 [place], [place]. This information is not intended to replace advice given to you by your health care provider. Make sure you discuss any questions you have with your health care provider.

## 2015-04-22 NOTE — Progress Notes (Signed)
   Subjective:    Patient ID: Cheyenne [name], female    DOB: 10/26/1980, 34 y.o.   MRN: [id number]  HPI Patient awoke this morning at 1 am with a sore, swollen throat. No runny  Nose, no ear pain, no cough, + sneezing, no fever. Took Benadryl at 1 am and it helped some, but after it wore off, she had more pain. Was able to eat a twinkie and drink liquids today. No sick contacts.   Past Medical History  Diagnosis Date  . Anxiety   No past surgical history on file. Family History  Problem Relation Age of Onset  . Hypertension Mother    Social History  Substance Use Topics  . Smoking status: Never Smoker   . Smokeless tobacco: None  . Alcohol Use: 0.6 oz/week    1 Standard drinks or equivalent per week     Comment: social drinker, 1 a month    Review of Systems Per HPI    Objective:   Physical Exam  Constitutional: She is oriented to person, place, and time. She appears well-developed and well-nourished. No distress.  HENT:  Head: Normocephalic and atraumatic.  Right Ear: Tympanic membrane, external ear and ear canal normal.  Left Ear: Tympanic membrane, external ear and ear canal normal.  Nose: Nose normal.  Mouth/Throat: No oropharyngeal exudate, posterior oropharyngeal edema or posterior oropharyngeal erythema.    Eyes: Conjunctivae are normal.  Cardiovascular: Normal rate, regular rhythm and normal heart sounds.   Pulmonary/Chest: Effort normal and breath sounds normal.  Lymphadenopathy:    She has no cervical adenopathy.  Neurological: She is alert and oriented to person, place, and time.  Skin: Skin is warm and dry. She is not diaphoretic.  Psychiatric: She has a normal mood and affect. Her behavior is normal. Judgment and thought content normal.  Vitals reviewed.  BP 134/86 mmHg  Pulse 98  Temp(Src) 98 F (36.7 C) (Oral)  Resp 17  Ht 1' (0.305 m)  Wt 209 lb (94.802 kg)  BMI 1019.10 kg/m2  SpO2 99% Wt Readings from Last 3 Encounters:  04/22/15 209 lb  (94.802 kg)  12/24/14 210 lb (95.255 kg)  08/15/14 197 lb (89.359 kg)   Results for orders placed or performed in visit on 04/22/15  POCT rapid strep A  Result Value Ref Range   Rapid Strep A Screen Negative Negative      Assessment & Plan:  1. Acute pharyngitis, unspecified pharyngitis type - suspect viral cause with negative rapid strep - Provided written and verbal information regarding diagnosis and treatment. - POCT rapid strep A - Culture, Group A Strep - Diphenhyd-Hydrocort-Nystatin (FIRST-DUKES MOUTHWASH) SUSP; Use as directed 5 mLs in the mouth or throat every 2 (two) hours as needed.  Dispense: 237 mL; Refill: 0 - RTC precautions- fever, worsening pain, SOB, difficulty swallowing liquids or solids, no improvement in 4-5 days.   Clarene Reamer, FNP-BC  [facility], [facility]  04/24/2015 9:51 PM

## 2015-04-23 ENCOUNTER — Encounter: Payer: Self-pay | Admitting: Family Medicine

## 2015-04-24 ENCOUNTER — Encounter: Payer: Self-pay | Admitting: Family Medicine

## 2015-04-25 ENCOUNTER — Encounter: Payer: Self-pay | Admitting: Family Medicine

## 2015-04-25 LAB — CULTURE, GROUP A STREP: ORGANISM ID, BACTERIA: NORMAL

## 2015-04-26 ENCOUNTER — Other Ambulatory Visit: Payer: Self-pay | Admitting: Family Medicine

## 2015-04-26 MED ORDER — DESONIDE 0.05 % EX LOTN: TOPICAL_LOTION | Freq: Two times a day (BID) | CUTANEOUS | Status: DC

## 2015-05-06 ENCOUNTER — Encounter: Payer: Self-pay | Admitting: Family Medicine

## 2015-05-07 ENCOUNTER — Other Ambulatory Visit: Payer: Self-pay | Admitting: Family Medicine

## 2015-05-07 MED ORDER — DESONIDE 0.05 % EX LOTN: TOPICAL_LOTION | Freq: Two times a day (BID) | CUTANEOUS | Status: DC

## 2015-06-19 ENCOUNTER — Encounter: Payer: Self-pay | Admitting: Family Medicine

## 2015-06-21 ENCOUNTER — Encounter: Payer: Self-pay | Admitting: Family Medicine

## 2015-09-01 ENCOUNTER — Ambulatory Visit (INDEPENDENT_AMBULATORY_CARE_PROVIDER_SITE_OTHER): Payer: Federal, State, Local not specified - PPO | Admitting: Family Medicine

## 2015-09-01 ENCOUNTER — Telehealth: Payer: Self-pay

## 2015-09-01 ENCOUNTER — Encounter: Payer: Self-pay | Admitting: Family Medicine

## 2015-09-01 VITALS — BP 119/74 | HR 87 | Temp 98.4°F | Resp 16 | Ht 63.0 in | Wt 205.0 lb

## 2015-09-01 DIAGNOSIS — F411 Generalized anxiety disorder: Secondary | ICD-10-CM | POA: Diagnosis not present

## 2015-09-01 DIAGNOSIS — R05 Cough: Secondary | ICD-10-CM | POA: Diagnosis not present

## 2015-09-01 DIAGNOSIS — R059 Cough, unspecified: Secondary | ICD-10-CM

## 2015-09-01 DIAGNOSIS — J069 Acute upper respiratory infection, unspecified: Secondary | ICD-10-CM

## 2015-09-01 DIAGNOSIS — R110 Nausea: Secondary | ICD-10-CM

## 2015-09-01 MED ORDER — HYDROCODONE-HOMATROPINE 5-1.5 MG/5ML PO SYRP: ORAL_SOLUTION | ORAL | Status: DC

## 2015-09-01 MED ORDER — ONDANSETRON 4 MG PO TBDP: 4.0000 mg | ORAL_TABLET | Freq: Three times a day (TID) | ORAL | Status: DC | PRN

## 2015-09-01 MED ORDER — ALPRAZOLAM 0.5 MG PO TABS: 0.5000 mg | ORAL_TABLET | Freq: Two times a day (BID) | ORAL | Status: DC | PRN

## 2015-09-01 NOTE — Patient Instructions (Signed)
Saline nasal spray atleast 4 times per day, over the counter mucinex or mucinex DM during the day for cough, hydrocodone cough syrup at nigh if needed.  Drink plenty of fluids.   Return to clinic if worsening.  Schedule appointment to discuss anxiety and xanax further and to establish with new primary provider to fill this medicine in the future.  Return to the clinic or go to the nearest emergency room if any of your symptoms worsen or new symptoms occur.  Upper Respiratory Infection, Adult Most upper respiratory infections (URIs) are a viral infection of the air passages leading to the lungs. A URI affects the nose, throat, and upper air passages. The most common type of URI is nasopharyngitis and is typically referred to as "the common cold." URIs run their course and usually go away on their own. Most of the time, a URI does not require medical attention, but sometimes a bacterial infection in the upper airways can follow a viral infection. This is called a secondary infection. Sinus and middle ear infections are common types of secondary upper respiratory infections. Bacterial pneumonia can also complicate a URI. A URI can worsen asthma and chronic obstructive pulmonary disease (COPD). Sometimes, these complications can require emergency medical care and may be life threatening.  CAUSES Almost all URIs are caused by viruses. A virus is a type of germ and can spread from one person to another.  RISKS FACTORS You may be at risk for a URI if:   You smoke.   You have chronic heart or lung disease.  You have a weakened defense (immune) system.   You are very young or very old.   You have nasal allergies or asthma.  You work in crowded or poorly ventilated areas.  You work in health care facilities or schools. SIGNS AND SYMPTOMS  Symptoms typically develop 2-3 days after you come in contact with a cold virus. Most viral URIs last 7-10 days. However, viral URIs from the influenza virus  (flu virus) can last 14-18 days and are typically more severe. Symptoms may include:   Runny or stuffy (congested) nose.   Sneezing.   Cough.   Sore throat.   Headache.   Fatigue.   Fever.   Loss of appetite.   Pain in your forehead, behind your eyes, and over your cheekbones (sinus pain).  Muscle aches.  DIAGNOSIS  Your health care provider may diagnose a URI by:  Physical exam.  Tests to check that your symptoms are not due to another condition such as:  Strep throat.  Sinusitis.  Pneumonia.  Asthma. TREATMENT  A URI goes away on its own with time. It cannot be cured with medicines, but medicines may be prescribed or recommended to relieve symptoms. Medicines may help:  Reduce your fever.  Reduce your cough.  Relieve nasal congestion. HOME CARE INSTRUCTIONS   Take medicines only as directed by your health care provider.   Gargle warm saltwater or take cough drops to comfort your throat as directed by your health care provider.  Use a warm mist humidifier or inhale steam from a shower to increase air moisture. This may make it easier to breathe.  Drink enough fluid to keep your urine clear or pale yellow.   Eat soups and other clear broths and maintain good nutrition.   Rest as needed.   Return to work when your temperature has returned to normal or as your health care provider advises. You may need to stay home longer  to avoid infecting others. You can also use a face mask and careful hand washing to prevent spread of the virus.  Increase the usage of your inhaler if you have asthma.   Do not use any tobacco products, including cigarettes, chewing tobacco, or electronic cigarettes. If you need help quitting, ask your health care provider. PREVENTION  The best way to protect yourself from getting a cold is to practice good hygiene.   Avoid oral or hand contact with people with cold symptoms.   Wash your hands often if contact occurs.   There is no clear evidence that vitamin C, vitamin E, echinacea, or exercise reduces the chance of developing a cold. However, it is always recommended to get plenty of rest, exercise, and practice good nutrition.  SEEK MEDICAL CARE IF:   You are getting worse rather than better.   Your symptoms are not controlled by medicine.   You have chills.  You have worsening shortness of breath.  You have brown or red mucus.  You have yellow or brown nasal discharge.  You have pain in your face, especially when you bend forward.  You have a fever.  You have swollen neck glands.  You have pain while swallowing.  You have white areas in the back of your throat. SEEK IMMEDIATE MEDICAL CARE IF:   You have severe or persistent:  Headache.  Ear pain.  Sinus pain.  Chest pain.  You have chronic lung disease and any of the following:  Wheezing.  Prolonged cough.  Coughing up blood.  A change in your usual mucus.  You have a stiff neck.  You have changes in your:  Vision.  Hearing.  Thinking.  Mood. MAKE SURE YOU:   Understand these instructions.  Will watch your condition.  Will get help right away if you are not doing well or get worse.   This information is not intended to replace advice given to you by your health care provider. Make sure you discuss any questions you have with your health care provider.   Document Released: 01/12/2001 Document Revised: 12/03/2014 Document Reviewed: 10/24/2013 Elsevier Interactive Patient Education 2016 [place] for Prescribing Controlled Substances (Revised 06/2012) 1. Prescriptions for controlled substances will be filled by ONE provider at [facility] with whom you have established and developed a plan for your care, including follow-up. 2. You are encouraged to schedule an appointment with your prescriber at our appointment center for follow-up visits whenever possible. 3. If you request a prescription  for the controlled substance while at [facility] for an acute problem (with someone other than your regular prescriber), you MAY be given a ONE-TIME prescription for a 30-day supply of the controlled substance, to allow time for you to return to see your regular prescriber for additional prescriptions.

## 2015-09-01 NOTE — Progress Notes (Signed)
Subjective:  By signing my name below, I, Moises Blood, attest that this documentation has been prepared under the direction and in the presence of Merri Ray, MD. Electronically Signed: Moises Blood, [place]. 09/01/2015 , 9:05 AM .  Patient was seen in Room 26 .   Patient ID: Cheyenne [name], female    DOB: September 30, 1980, 35 y.o.   MRN: [id number] Chief Complaint  Patient presents with  . Cough    x 3 days  . Sore Throat   HPI Cheyenne [name] is a 35 y.o. female  Pt states that she started feeling sick 3 days. She notes having a root canal done 5-6 days ago and her face swelled up afterwards. She went back to her dentist and was given clindamycin 300mg  qid 3 days ago. She started feeling dizzy, nauseous, fatigue and cold symptoms which include sore throat, cough and congestion after taking the medication. She mentions cough keeping her up at night. She denies taking medication for this. She denies vomiting, fever and anymore facial swelling. She notes having bronchitis in the past.   Xanax She takes xanax 0.5mg  for anxiety. She states taking it as needed when she goes to work due to anxiety. This was prescribed by Araceli Bouche, PA. On average, she takes 3 tablets a week. She informs that she's almost out of this medication.   There are no active problems to display for this patient.  Past Medical History  Diagnosis Date  . Anxiety    No past surgical history on file. No Known Allergies Prior to Admission medications   Medication Sig Start Date End Date Taking? Authorizing Provider  albuterol (PROVENTIL HFA;VENTOLIN HFA) 108 (90 BASE) MCG/ACT inhaler Inhale 2 puffs into the lungs every 4 (four) hours as needed for wheezing. 12/24/14   Araceli Bouche, PA  ALPRAZolam Duanne Moron) 0.5 MG tablet Take 1 tablet (0.5 mg total) by mouth at bedtime as needed for anxiety. 12/24/14   Araceli Bouche, PA  cetirizine (ZYRTEC) 10 MG tablet Take 1 tablet (10 mg total) by mouth daily. 12/30/14   Todd  McVeigh, PA  desonide (DESOWEN) 0.05 % lotion Apply topically 2 (two) times daily. 05/07/15   Elby Beck, FNP  Diphenhyd-Hydrocort-Nystatin (FIRST-DUKES MOUTHWASH) SUSP Use as directed 5 mLs in the mouth or throat every 2 (two) hours as needed. 04/22/15   Elby Beck, FNP  guaiFENesin (MUCINEX) 600 MG 12 hr tablet Take by mouth 2 (two) times daily.    Historical Provider, MD  ibuprofen (ADVIL,MOTRIN) 600 MG tablet Take 1 tablet (600 mg total) by mouth every 8 (eight) hours as needed for pain. 04/10/13   Orma Flaming, MD  Norethindrone-Eth Estradiol (NORTREL 0.5/35, 28, PO) Take by mouth.    Historical Provider, MD   Social History   Social History  . Marital Status: Single    Spouse Name: N/A  . Number of Children: N/A  . Years of Education: N/A   Occupational History  . Claims Rep    Social History Main Topics  . Smoking status: Never Smoker   . Smokeless tobacco: Not on file  . Alcohol Use: 0.6 oz/week    1 Standard drinks or equivalent per week     Comment: social drinker, 1 a month  . Drug Use: No  . Sexual Activity: Yes   Other Topics Concern  . Not on file   Social History Narrative   Married. Education: [employer]. Exercise: No.   Review of Systems  Constitutional: Positive for fatigue. Negative  for fever and chills.  HENT: Positive for congestion and sore throat. Negative for dental problem and facial swelling.   Respiratory: Positive for cough.   Gastrointestinal: Positive for nausea. Negative for vomiting, diarrhea and constipation.  Neurological: Positive for dizziness. Negative for light-headedness.       Objective:   Physical Exam  Constitutional: She is oriented to person, place, and time. She appears well-developed and well-nourished. No distress.  HENT:  Head: Normocephalic and atraumatic.  Right Ear: Hearing, tympanic membrane, external ear and ear canal normal.  Left Ear: Hearing, tympanic membrane, external ear and ear canal normal.  Nose:  Nose normal.  Mouth/Throat: Oropharynx is clear and moist. No oropharyngeal exudate.  Slight cerumen in right canal, but TM was visualized pearly gray  Eyes: Conjunctivae and EOM are normal. Pupils are equal, round, and reactive to light.  Cardiovascular: Normal rate, regular rhythm, normal heart sounds and intact distal pulses.   No murmur heard. Pulmonary/Chest: Effort normal and breath sounds normal. No respiratory distress. She has no wheezes. She has no rhonchi.  Lymphadenopathy:    She has no cervical adenopathy.  Neurological: She is alert and oriented to person, place, and time.  Skin: Skin is warm and dry. No rash noted.  Psychiatric: She has a normal mood and affect. Her behavior is normal.  Vitals reviewed.   Filed Vitals:   09/01/15 0851  BP: 119/74  Pulse: 87  Temp: 98.4 F (36.9 C)  Resp: 16  Height: 5\' 3"  (1.6 m)  Weight: 205 lb (92.987 kg)  SpO2: 97%      Assessment & Plan:   Cheyenne [name] is a 35 y.o. female Anxiety state - Plan: ALPRAZolam (XANAX) 0.5 MG tablet  - intermittent xanax use. refilled temporarily, but discussed need for follow up to discuss further.    Acute upper respiratory infection - Plan: HYDROcodone-homatropine (HYCODAN) 5-1.5 MG/5ML syrup Cough - Plan: HYDROcodone-homatropine (HYCODAN) 5-1.5 MG/5ML syrup  - suspected viral URI. Reassuring exam.  Already on Clindamycin for dental issue.   -mucinex, saline ns, hycodan cough syrup and sx care discussed. rtc precautions.   Nausea without vomiting - Plan: ondansetron (ZOFRAN ODT) 4 MG disintegrating tablet  -may be due to clindamycin. Improved. zofran if needed short course, but if worsens/persists - call dentist to discuss further.   Meds ordered this encounter  Medications  . HYDROcodone-homatropine (HYCODAN) 5-1.5 MG/5ML syrup    Sig: 31m by mouth a bedtime as needed for cough.    Dispense:  120 mL    Refill:  0  . ondansetron (ZOFRAN ODT) 4 MG disintegrating tablet    Sig: Take 1  tablet (4 mg total) by mouth every 8 (eight) hours as needed for nausea or vomiting.    Dispense:  5 tablet    Refill:  0  . ALPRAZolam (XANAX) 0.5 MG tablet    Sig: Take 1 tablet (0.5 mg total) by mouth 2 (two) times daily as needed for anxiety.    Dispense:  10 tablet    Refill:  0   Patient Instructions  Saline nasal spray atleast 4 times per day, over the counter mucinex or mucinex DM during the day for cough, hydrocodone cough syrup at nigh if needed.  Drink plenty of fluids.   Return to clinic if worsening.  Schedule appointment to discuss anxiety and xanax further and to establish with new primary provider to fill this medicine in the future.  Return to the clinic or go to the nearest emergency room  if any of your symptoms worsen or new symptoms occur.  Upper Respiratory Infection, Adult Most upper respiratory infections (URIs) are a viral infection of the air passages leading to the lungs. A URI affects the nose, throat, and upper air passages. The most common type of URI is nasopharyngitis and is typically referred to as "the common cold." URIs run their course and usually go away on their own. Most of the time, a URI does not require medical attention, but sometimes a bacterial infection in the upper airways can follow a viral infection. This is called a secondary infection. Sinus and middle ear infections are common types of secondary upper respiratory infections. Bacterial pneumonia can also complicate a URI. A URI can worsen asthma and chronic obstructive pulmonary disease (COPD). Sometimes, these complications can require emergency medical care and may be life threatening.  CAUSES Almost all URIs are caused by viruses. A virus is a type of germ and can spread from one person to another.  RISKS FACTORS You may be at risk for a URI if:   You smoke.   You have chronic heart or lung disease.  You have a weakened defense (immune) system.   You are very young or very old.    You have nasal allergies or asthma.  You work in crowded or poorly ventilated areas.  You work in health care facilities or schools. SIGNS AND SYMPTOMS  Symptoms typically develop 2-3 days after you come in contact with a cold virus. Most viral URIs last 7-10 days. However, viral URIs from the influenza virus (flu virus) can last 14-18 days and are typically more severe. Symptoms may include:   Runny or stuffy (congested) nose.   Sneezing.   Cough.   Sore throat.   Headache.   Fatigue.   Fever.   Loss of appetite.   Pain in your forehead, behind your eyes, and over your cheekbones (sinus pain).  Muscle aches.  DIAGNOSIS  Your health care provider may diagnose a URI by:  Physical exam.  Tests to check that your symptoms are not due to another condition such as:  Strep throat.  Sinusitis.  Pneumonia.  Asthma. TREATMENT  A URI goes away on its own with time. It cannot be cured with medicines, but medicines may be prescribed or recommended to relieve symptoms. Medicines may help:  Reduce your fever.  Reduce your cough.  Relieve nasal congestion. HOME CARE INSTRUCTIONS   Take medicines only as directed by your health care provider.   Gargle warm saltwater or take cough drops to comfort your throat as directed by your health care provider.  Use a warm mist humidifier or inhale steam from a shower to increase air moisture. This may make it easier to breathe.  Drink enough fluid to keep your urine clear or pale yellow.   Eat soups and other clear broths and maintain good nutrition.   Rest as needed.   Return to work when your temperature has returned to normal or as your health care provider advises. You may need to stay home longer to avoid infecting others. You can also use a face mask and careful hand washing to prevent spread of the virus.  Increase the usage of your inhaler if you have asthma.   Do not use any tobacco products,  including cigarettes, chewing tobacco, or electronic cigarettes. If you need help quitting, ask your health care provider. PREVENTION  The best way to protect yourself from getting a cold is to practice good  hygiene.   Avoid oral or hand contact with people with cold symptoms.   Wash your hands often if contact occurs.  There is no clear evidence that vitamin C, vitamin E, echinacea, or exercise reduces the chance of developing a cold. However, it is always recommended to get plenty of rest, exercise, and practice good nutrition.  SEEK MEDICAL CARE IF:   You are getting worse rather than better.   Your symptoms are not controlled by medicine.   You have chills.  You have worsening shortness of breath.  You have brown or red mucus.  You have yellow or brown nasal discharge.  You have pain in your face, especially when you bend forward.  You have a fever.  You have swollen neck glands.  You have pain while swallowing.  You have white areas in the back of your throat. SEEK IMMEDIATE MEDICAL CARE IF:   You have severe or persistent:  Headache.  Ear pain.  Sinus pain.  Chest pain.  You have chronic lung disease and any of the following:  Wheezing.  Prolonged cough.  Coughing up blood.  A change in your usual mucus.  You have a stiff neck.  You have changes in your:  Vision.  Hearing.  Thinking.  Mood. MAKE SURE YOU:   Understand these instructions.  Will watch your condition.  Will get help right away if you are not doing well or get worse.   This information is not intended to replace advice given to you by your health care provider. Make sure you discuss any questions you have with your health care provider.   Document Released: 01/12/2001 Document Revised: 12/03/2014 Document Reviewed: 10/24/2013 Elsevier Interactive Patient Education 2016 [place] for Prescribing Controlled Substances (Revised  06/2012) 1. Prescriptions for controlled substances will be filled by ONE provider at [facility] with whom you have established and developed a plan for your care, including follow-up. 2. You are encouraged to schedule an appointment with your prescriber at our appointment center for follow-up visits whenever possible. 3. If you request a prescription for the controlled substance while at [facility] for an acute problem (with someone other than your regular prescriber), you MAY be given a ONE-TIME prescription for a 30-day supply of the controlled substance, to allow time for you to return to see your regular prescriber for additional prescriptions.        I personally performed the services described in this documentation, which was scribed in my presence. The recorded information has been reviewed and considered, and addended by me as needed.

## 2015-09-01 NOTE — Telephone Encounter (Signed)
Letter done

## 2015-09-01 NOTE — Telephone Encounter (Signed)
Pt was seen today and forgot to get a work note for being out today and would like to have it mailed to her   Best number [phone]

## 2015-09-12 ENCOUNTER — Other Ambulatory Visit: Payer: Self-pay | Admitting: Obstetrics

## 2015-11-06 DIAGNOSIS — S9031XA Contusion of right foot, initial encounter: Secondary | ICD-10-CM | POA: Diagnosis not present

## 2015-11-16 ENCOUNTER — Encounter: Payer: Self-pay | Admitting: Family Medicine

## 2015-11-16 DIAGNOSIS — J309 Allergic rhinitis, unspecified: Secondary | ICD-10-CM

## 2015-11-17 ENCOUNTER — Other Ambulatory Visit: Payer: Self-pay | Admitting: Family Medicine

## 2015-11-22 MED ORDER — HYDROXYZINE HCL 25 MG PO TABS: 25.0000 mg | ORAL_TABLET | Freq: Three times a day (TID) | ORAL | Status: DC | PRN

## 2015-11-22 MED ORDER — CETIRIZINE HCL 10 MG PO TABS: 10.0000 mg | ORAL_TABLET | Freq: Every day | ORAL | Status: DC

## 2015-11-28 DIAGNOSIS — S96819A Strain of other specified muscles and tendons at ankle and foot level, unspecified foot, initial encounter: Secondary | ICD-10-CM | POA: Diagnosis not present

## 2015-12-11 DIAGNOSIS — J018 Other acute sinusitis: Secondary | ICD-10-CM | POA: Diagnosis not present

## 2015-12-30 DIAGNOSIS — K080 Exfoliation of teeth due to systemic causes: Secondary | ICD-10-CM | POA: Diagnosis not present

## 2016-01-09 DIAGNOSIS — S96811D Strain of other specified muscles and tendons at ankle and foot level, right foot, subsequent encounter: Secondary | ICD-10-CM | POA: Diagnosis not present

## 2016-01-14 ENCOUNTER — Encounter: Payer: Self-pay | Admitting: Family Medicine

## 2016-03-30 DIAGNOSIS — L810 Postinflammatory hyperpigmentation: Secondary | ICD-10-CM | POA: Diagnosis not present

## 2016-03-30 DIAGNOSIS — R21 Rash and other nonspecific skin eruption: Secondary | ICD-10-CM | POA: Diagnosis not present

## 2016-06-04 ENCOUNTER — Ambulatory Visit (INDEPENDENT_AMBULATORY_CARE_PROVIDER_SITE_OTHER): Payer: Federal, State, Local not specified - PPO | Admitting: Sports Medicine

## 2016-06-04 ENCOUNTER — Encounter (INDEPENDENT_AMBULATORY_CARE_PROVIDER_SITE_OTHER): Payer: Self-pay | Admitting: Sports Medicine

## 2016-06-04 VITALS — BP 135/82 | HR 99 | Ht 63.0 in | Wt 200.0 lb

## 2016-06-04 DIAGNOSIS — M722 Plantar fascial fibromatosis: Secondary | ICD-10-CM | POA: Diagnosis not present

## 2016-06-04 NOTE — Patient Instructions (Signed)
[email or web address]

## 2016-06-04 NOTE — Progress Notes (Signed)
Cheyenne [name] - 35 y.o. female MRN [id number]  Date of birth: 04-27-1981  Office Visit Note: Visit Date: 06/04/2016 PCP: No PCP Per Patient Referred by: No ref. provider found  Subjective: Chief Complaint  Patient presents with  . Left Foot - Pain  . Follow-up   HPI: Patient states she is here for an injection in left foot maybe?  Having problem on/off, but not hurting severely today however it was yesterday. To pain is focally located along the plantar aspect of the heel. She responded well previously to an injection on the right & would like this performed on the left. She has tried Alfredson exercises but has backed off on the slightly..    ROS Otherwise per HPI.  Assessment & Plan: Visit Diagnoses:  1. Plantar fasciitis, left     Plan: Findings:  Left-sided plantar fascial injection performed today patient's request. Continue Alfredson exercises, compression & cushioning.    Meds & Orders: No orders of the defined types were placed in this encounter.   Orders Placed This Encounter  Procedures  . Foot Injection    Follow-up: Return if symptoms worsen or fail to improve.   Procedures: Plantar Fasciitis Date/Time: 06/04/2016 4:28 PM Performed by: Teresa Coombs D Authorized by: Teresa Coombs D   Indications:  Fasciitis and pain Condition: Plantar Fasciitis   Location: left plantar fascia muscle   Needle Size:  22 G Approach: medial. Medications:  1 mL bupivacaine 0.5 %; 1 mL lidocaine 1 %; 40 mg methylPREDNISolone acetate 40 MG/ML Comments: The patient's clinical condition is marked by substantial pain and/or significant functional disability. Other conservative therapy has not provided relief, is contraindicated, or not appropriate. There is a reasonable likelihood that injection will significantly improve the patient's pain and/or functional impairment.  After discussing the risks, benefits and expected outcomes of the injection and all questions were reviewed  and answered, the patient wished to undergo the above named procedure.  Verbal consent was obtained. The target sight was prepped with alcohol scrub. Local anesthesia was obtained with ethyl chloride.  Under real-time ultrasound guidance, Injection of the target structure was performed using the above needle and medications under sterile technique. Band-Aid was applied. The patient tolerated this procedure well with no immediate complications. Post injection instructions were provided.        No notes on file   Clinical History: No specialty comments available.  She reports that she has never smoked. She does not have any smokeless tobacco history on file. No results for input(s): HGBA1C, LABURIC in the last 8760 hours.  Objective:  VS:  HT:5\' 3"  (160 cm)   WT:200 lb (90.7 kg)  BMI:35.5    BP:135/82  HR:99bpm  TEMP: ( )  RESP:  Physical Exam  Ortho Exam Imaging: No results found.  Past Medical/Family/Surgical/Social History: Medications & Allergies reviewed per EMR There are no active problems to display for this patient.  Past Medical History:  Diagnosis Date  . Anxiety    Family History  Problem Relation Age of Onset  . Hypertension Mother    No past surgical history on file. Social History   Occupational History  . Claims Rep    Social History Main Topics  . Smoking status: Never Smoker  . Smokeless tobacco: Not on file  . Alcohol use 0.6 oz/week    1 Standard drinks or equivalent per week     Comment: social drinker, 1 a month  . Drug use: No  . Sexual activity: Yes

## 2016-06-07 ENCOUNTER — Telehealth (INDEPENDENT_AMBULATORY_CARE_PROVIDER_SITE_OTHER): Payer: Self-pay | Admitting: Sports Medicine

## 2016-06-07 NOTE — Telephone Encounter (Signed)
Patient wants to know if she can get a  Muscle relaxer to help with the tightness in her left calf muscle.  Pharmacy: [place]: [phone]

## 2016-06-08 MED ORDER — LIDOCAINE HCL 1 % IJ SOLN
1.0000 mL | INTRAMUSCULAR | Status: AC | PRN
  Administered 2016-06-04: 1 mL

## 2016-06-08 MED ORDER — BUPIVACAINE HCL 0.5 % IJ SOLN
1.0000 mL | INTRAMUSCULAR | Status: AC | PRN
  Administered 2016-06-04: 1 mL

## 2016-06-08 MED ORDER — METHYLPREDNISOLONE ACETATE 40 MG/ML IJ SUSP
40.0000 mg | INTRAMUSCULAR | Status: AC | PRN
  Administered 2016-06-04: 40 mg

## 2016-07-28 DIAGNOSIS — K080 Exfoliation of teeth due to systemic causes: Secondary | ICD-10-CM | POA: Diagnosis not present

## 2016-08-06 DIAGNOSIS — K080 Exfoliation of teeth due to systemic causes: Secondary | ICD-10-CM | POA: Diagnosis not present

## 2016-08-26 DIAGNOSIS — Z113 Encounter for screening for infections with a predominantly sexual mode of transmission: Secondary | ICD-10-CM | POA: Diagnosis not present

## 2016-08-26 DIAGNOSIS — Z6838 Body mass index (BMI) 38.0-38.9, adult: Secondary | ICD-10-CM | POA: Diagnosis not present

## 2016-08-26 DIAGNOSIS — Z1159 Encounter for screening for other viral diseases: Secondary | ICD-10-CM | POA: Diagnosis not present

## 2016-08-26 DIAGNOSIS — Z114 Encounter for screening for human immunodeficiency virus [HIV]: Secondary | ICD-10-CM | POA: Diagnosis not present

## 2016-08-26 DIAGNOSIS — Z1151 Encounter for screening for human papillomavirus (HPV): Secondary | ICD-10-CM | POA: Diagnosis not present

## 2016-08-26 DIAGNOSIS — Z01419 Encounter for gynecological examination (general) (routine) without abnormal findings: Secondary | ICD-10-CM | POA: Diagnosis not present

## 2016-08-26 DIAGNOSIS — Z319 Encounter for procreative management, unspecified: Secondary | ICD-10-CM | POA: Diagnosis not present

## 2016-10-18 DIAGNOSIS — J310 Chronic rhinitis: Secondary | ICD-10-CM | POA: Diagnosis not present

## 2017-01-27 DIAGNOSIS — K080 Exfoliation of teeth due to systemic causes: Secondary | ICD-10-CM | POA: Diagnosis not present

## 2017-01-28 ENCOUNTER — Other Ambulatory Visit: Payer: Self-pay | Admitting: Family Medicine

## 2017-01-28 DIAGNOSIS — J309 Allergic rhinitis, unspecified: Secondary | ICD-10-CM

## 2017-02-01 ENCOUNTER — Other Ambulatory Visit: Payer: Self-pay | Admitting: Family Medicine

## 2017-02-01 DIAGNOSIS — J309 Allergic rhinitis, unspecified: Secondary | ICD-10-CM

## 2017-04-15 ENCOUNTER — Other Ambulatory Visit: Payer: Self-pay | Admitting: Family Medicine

## 2017-04-15 DIAGNOSIS — J309 Allergic rhinitis, unspecified: Secondary | ICD-10-CM

## 2017-07-08 DIAGNOSIS — H40033 Anatomical narrow angle, bilateral: Secondary | ICD-10-CM | POA: Diagnosis not present

## 2017-07-08 DIAGNOSIS — H00021 Hordeolum internum right upper eyelid: Secondary | ICD-10-CM | POA: Diagnosis not present

## 2017-08-08 DIAGNOSIS — K080 Exfoliation of teeth due to systemic causes: Secondary | ICD-10-CM | POA: Diagnosis not present

## 2017-08-30 DIAGNOSIS — Z6839 Body mass index (BMI) 39.0-39.9, adult: Secondary | ICD-10-CM | POA: Diagnosis not present

## 2017-08-30 DIAGNOSIS — Z01419 Encounter for gynecological examination (general) (routine) without abnormal findings: Secondary | ICD-10-CM | POA: Diagnosis not present

## 2018-01-20 DIAGNOSIS — K1121 Acute sialoadenitis: Secondary | ICD-10-CM | POA: Diagnosis not present

## 2018-02-13 DIAGNOSIS — K080 Exfoliation of teeth due to systemic causes: Secondary | ICD-10-CM | POA: Diagnosis not present

## 2018-03-17 ENCOUNTER — Ambulatory Visit (INDEPENDENT_AMBULATORY_CARE_PROVIDER_SITE_OTHER): Payer: Federal, State, Local not specified - PPO | Admitting: Family Medicine

## 2018-03-17 ENCOUNTER — Encounter (INDEPENDENT_AMBULATORY_CARE_PROVIDER_SITE_OTHER): Payer: Self-pay | Admitting: Family Medicine

## 2018-03-17 ENCOUNTER — Ambulatory Visit (INDEPENDENT_AMBULATORY_CARE_PROVIDER_SITE_OTHER): Payer: Self-pay

## 2018-03-17 DIAGNOSIS — M79671 Pain in right foot: Secondary | ICD-10-CM

## 2018-03-17 MED ORDER — VITAMIN D-3 125 MCG (5000 UT) PO TABS: 1.0000 | ORAL_TABLET | Freq: Every day | ORAL | 3 refills | Status: DC

## 2018-03-17 MED ORDER — ETODOLAC 500 MG PO TABS: 500.0000 mg | ORAL_TABLET | Freq: Two times a day (BID) | ORAL | 3 refills | Status: DC | PRN

## 2018-03-17 NOTE — Progress Notes (Signed)
   Office Visit Note   Patient: Cheyenne [name]           Date of Birth: 10/14/1980           MRN: [id number] Visit Date: 03/17/2018 Requested by: No referring provider defined for this encounter. PCP: Patient, No Pcp Per  Subjective: Chief Complaint  Patient presents with  . Right Foot - Pain    NKI - pain top of foot intermittently over the past 2 weeks    HPI: She is a 37 year old with right foot pain.  Lateral pain for the past couple weeks, no definite injury.  Pain with weightbearing or pivoting.  She has not noticed any swelling, numbness or tingling.  No change in her activity level or foot care.  She has a history of plantar fasciitis on the left, no right-sided problems.              ROS: Otherwise negative.  Objective: Vital Signs: There were no vitals taken for this visit.  Physical Exam:  Right foot: No swelling or bruising.  Point tender at the distal fourth metatarsal.  No pain with flexion or extension of the toes against resistance.  No pain on the plantar aspect of her foot.  Imaging: 3 view right foot x-rays show no significant arthritis, no obvious stress fracture.  Assessment & Plan: 1.  Right foot pain, concerning for fourth metatarsal stress fracture -Activity modification, anti-inflammatories and vitamin D.  X-rays again in 2 to 3 weeks if pain has persisted.   Follow-Up Instructions: No follow-ups on file.     Procedures: None today.   PMFS History: There are no active problems to display for this patient.  Past Medical History:  Diagnosis Date  . Anxiety     Family History  Problem Relation Age of Onset  . Hypertension Mother     History reviewed. No pertinent surgical history. Social History   Occupational History  . Occupation: Claims Rep  Tobacco Use  . Smoking status: Never Smoker  Substance and Sexual Activity  . Alcohol use: Yes    Alcohol/week: 1.0 standard drinks    Types: 1 Standard drinks or equivalent per week   Comment: social drinker, 1 a month  . Drug use: No  . Sexual activity: Yes

## 2018-04-24 ENCOUNTER — Ambulatory Visit (INDEPENDENT_AMBULATORY_CARE_PROVIDER_SITE_OTHER): Payer: Federal, State, Local not specified - PPO | Admitting: Family Medicine

## 2018-04-24 ENCOUNTER — Ambulatory Visit (INDEPENDENT_AMBULATORY_CARE_PROVIDER_SITE_OTHER): Payer: Self-pay

## 2018-04-24 ENCOUNTER — Encounter (INDEPENDENT_AMBULATORY_CARE_PROVIDER_SITE_OTHER): Payer: Self-pay | Admitting: Family Medicine

## 2018-04-24 ENCOUNTER — Telehealth (INDEPENDENT_AMBULATORY_CARE_PROVIDER_SITE_OTHER): Payer: Self-pay | Admitting: Family Medicine

## 2018-04-24 DIAGNOSIS — M79671 Pain in right foot: Secondary | ICD-10-CM | POA: Diagnosis not present

## 2018-04-24 NOTE — Telephone Encounter (Signed)
Patient left a voicemail - said she was seen today, she is requesting copies of her x-rays to be emailed if possible ? Email: [email or web address]  Patients # [phone]

## 2018-04-24 NOTE — Progress Notes (Signed)
   Office Visit Note   Patient: Cheyenne [name]           Date of Birth: 04/28/1981           MRN: [id number] Visit Date: 04/24/2018 Requested by: No referring provider defined for this encounter. PCP: Patient, No Pcp Per  Subjective: Chief Complaint  Patient presents with  . Right Foot - Follow-up    HPI: She is here with persistent right foot pain.  No change since last visit.  Pain across the dorsum of her foot.  Hurts her mostly when walking.              ROS: Noncontributory  Objective: Vital Signs: There were no vitals taken for this visit.  Physical Exam:  Right foot: No bruising or swelling.  Today is tender at the distal fourth metatarsal, midshaft fifth metatarsal, and distal second metatarsal from the dorsal approach.  No significant plantar pain.  No pain with flexion or extension of the toes against resistance.  Imaging: 3 view right foot x-rays: Normal anatomic alignment, no obvious stress fracture.    Assessment & Plan: 1.  Persistent right foot pain, almost 2 months, etiology uncertain. -Fracture boot during activity for the next few weeks.  If still having pain at that point, then MRI scan.   Follow-Up Instructions: No follow-ups on file.       Procedures: None today.   PMFS History: There are no active problems to display for this patient.  Past Medical History:  Diagnosis Date  . Anxiety     Family History  Problem Relation Age of Onset  . Hypertension Mother     No past surgical history on file. Social History   Occupational History  . Occupation: Claims Rep  Tobacco Use  . Smoking status: Never Smoker  Substance and Sexual Activity  . Alcohol use: Yes    Alcohol/week: 1.0 standard drinks    Types: 1 Standard drinks or equivalent per week    Comment: social drinker, 1 a month  . Drug use: No  . Sexual activity: Yes

## 2018-04-25 NOTE — Telephone Encounter (Signed)
Can you call patient to discuss options?  CD vs paper copy. Thanks

## 2018-04-25 NOTE — Telephone Encounter (Signed)
I called patient and discussed options for obtaining her x-rays. Patient wanted paper copies mailed to her house. I placed printed copies in the box on 9/24 to be put in mail.

## 2018-04-27 ENCOUNTER — Encounter (INDEPENDENT_AMBULATORY_CARE_PROVIDER_SITE_OTHER): Payer: Self-pay | Admitting: Family Medicine

## 2018-04-27 MED ORDER — TIZANIDINE HCL 2 MG PO TABS: 2.0000 mg | ORAL_TABLET | Freq: Every evening | ORAL | 1 refills | Status: DC | PRN

## 2018-04-27 MED ORDER — IBUPROFEN 800 MG PO TABS: 800.0000 mg | ORAL_TABLET | Freq: Three times a day (TID) | ORAL | 3 refills | Status: DC | PRN

## 2018-04-28 ENCOUNTER — Other Ambulatory Visit (INDEPENDENT_AMBULATORY_CARE_PROVIDER_SITE_OTHER): Payer: Self-pay | Admitting: Family Medicine

## 2018-04-28 DIAGNOSIS — M79671 Pain in right foot: Secondary | ICD-10-CM

## 2018-05-02 DIAGNOSIS — R600 Localized edema: Secondary | ICD-10-CM | POA: Insufficient documentation

## 2018-05-05 ENCOUNTER — Telehealth (INDEPENDENT_AMBULATORY_CARE_PROVIDER_SITE_OTHER): Payer: Self-pay

## 2018-05-05 NOTE — Telephone Encounter (Signed)
-----   Message from Eunice Blase, MD sent at 05/05/2018  7:44 AM EDT ----- Regarding: MRI approval Pt is asking about status of MRI scheduling/approval.

## 2018-05-05 NOTE — Telephone Encounter (Signed)
Talked with patient and she has an appointment on Saturday, 05/13/2018 for MRI.

## 2018-05-13 ENCOUNTER — Ambulatory Visit
Admission: RE | Admit: 2018-05-13 | Discharge: 2018-05-13 | Disposition: A | Discharge status: Home / Self Care | Payer: Federal, State, Local not specified - PPO | Source: Ambulatory Visit | Attending: Family Medicine | Admitting: Family Medicine

## 2018-05-13 DIAGNOSIS — M79671 Pain in right foot: Secondary | ICD-10-CM

## 2018-05-13 DIAGNOSIS — M7989 Other specified soft tissue disorders: Secondary | ICD-10-CM | POA: Diagnosis not present

## 2018-05-15 ENCOUNTER — Telehealth (INDEPENDENT_AMBULATORY_CARE_PROVIDER_SITE_OTHER): Payer: Self-pay | Admitting: Family Medicine

## 2018-05-15 ENCOUNTER — Encounter (INDEPENDENT_AMBULATORY_CARE_PROVIDER_SITE_OTHER): Payer: Self-pay | Admitting: Family Medicine

## 2018-05-15 NOTE — Telephone Encounter (Signed)
MRI does not show a stress fracture or anything that might require surgery.  There is some mild soft tissue swelling, but nothing to really explain the pain.

## 2018-05-19 ENCOUNTER — Other Ambulatory Visit: Payer: Self-pay

## 2018-05-19 ENCOUNTER — Encounter: Payer: Self-pay | Admitting: Physician Assistant

## 2018-05-19 ENCOUNTER — Ambulatory Visit: Payer: Federal, State, Local not specified - PPO | Admitting: Physician Assistant

## 2018-05-19 VITALS — BP 127/83 | HR 80 | Temp 98.2°F | Resp 16 | Ht 61.0 in | Wt 217.8 lb

## 2018-05-19 DIAGNOSIS — M79671 Pain in right foot: Secondary | ICD-10-CM | POA: Diagnosis not present

## 2018-05-19 DIAGNOSIS — K5900 Constipation, unspecified: Secondary | ICD-10-CM

## 2018-05-19 MED ORDER — POLYETHYLENE GLYCOL 3350 17 GM/SCOOP PO POWD: 17.0000 g | Freq: Two times a day (BID) | ORAL | 1 refills | Status: DC | PRN

## 2018-05-19 NOTE — Progress Notes (Signed)
Cheyenne [name]  MRN: [id number] DOB: 10-27-1980  PCP: Patient, No Pcp Per  Subjective:  Pt is a 37 year old female who presents to clinic for several complaints.   Right foot pain, per pt had an MRI on last Saturday, diagnosed with" fluid on foot". foot is still hurting. She was told that she needs to go to PT, referral has already been made. She is using ice. Feels about 70% improvement compared to 2 months ago. She would like second opinion.   Abdominal pain lower under the left side. Hurts when she moves. "feels like gas" No pain while remaining still. She endorses mild constipation recently. "I guess I have been eating more cheeseburgers" No exercise since foot started hurting two months ago.  Denies fever, chills, nausea, vomiting, diarrhea.   Review of Systems  Constitutional: Negative for appetite change, chills, diaphoresis, fatigue and fever.  Gastrointestinal: Positive for abdominal pain (left lower), blood in stool and constipation. Negative for anal bleeding, diarrhea, nausea, rectal pain and vomiting.  Musculoskeletal: Positive for arthralgias (right foot). Negative for gait problem and joint swelling.  Skin: Negative.     There are no active problems to display for this patient.   Current Outpatient Medications on File Prior to Visit  Medication Sig Dispense Refill  . ALAYCEN 1/35 tablet TAKE 1 (ONE) TABLET DAILY, CONTINUOUSLY  4  . albuterol (PROVENTIL HFA;VENTOLIN HFA) 108 (90 BASE) MCG/ACT inhaler Inhale 2 puffs into the lungs every 4 (four) hours as needed for wheezing. 1 Inhaler 2  . ALPRAZolam (XANAX) 0.5 MG tablet Take 1 tablet (0.5 mg total) by mouth 2 (two) times daily as needed for anxiety. 10 tablet 0  . Cholecalciferol (VITAMIN D-3) 5000 units TABS Take 1 tablet by mouth daily. 90 tablet 3  . desonide (DESOWEN) 0.05 % lotion Apply topically 2 (two) times daily. 118 mL 1  . etodolac (LODINE) 500 MG tablet Take 1 tablet (500 mg total) by mouth 2 (two) times  daily as needed. 60 tablet 3  . ibuprofen (ADVIL,MOTRIN) 800 MG tablet Take 1 tablet (800 mg total) by mouth every 8 (eight) hours as needed. 90 tablet 3  . tiZANidine (ZANAFLEX) 2 MG tablet Take 1-2 tablets (2-4 mg total) by mouth at bedtime as needed for muscle spasms. 60 tablet 1  . cetirizine (ZYRTEC) 10 MG tablet Take 1 tablet (10 mg total) by mouth daily. 30 tablet 11  . Diphenhyd-Hydrocort-Nystatin (FIRST-DUKES MOUTHWASH) SUSP Use as directed 5 mLs in the mouth or throat every 2 (two) hours as needed. (Patient not taking: Reported on 06/04/2016) 237 mL 0  . guaiFENesin (MUCINEX) 600 MG 12 hr tablet Take by mouth 2 (two) times daily.    Marland Kitchen HYDROcodone-homatropine (HYCODAN) 5-1.5 MG/5ML syrup 24m by mouth a bedtime as needed for cough. (Patient not taking: Reported on 06/04/2016) 120 mL 0  . hydrOXYzine (ATARAX/VISTARIL) 25 MG tablet Take 1 tablet (25 mg total) by mouth 3 (three) times daily as needed for itching. (Patient not taking: Reported on 06/04/2016) 30 tablet 2  . ibuprofen (ADVIL,MOTRIN) 600 MG tablet Take 1 tablet (600 mg total) by mouth every 8 (eight) hours as needed for pain. 30 tablet 0  . Norethindrone-Eth Estradiol (NORTREL 0.5/35, 28, PO) Take by mouth.    . ondansetron (ZOFRAN ODT) 4 MG disintegrating tablet Take 1 tablet (4 mg total) by mouth every 8 (eight) hours as needed for nausea or vomiting. (Patient not taking: Reported on 06/04/2016) 5 tablet 0   No current facility-administered  medications on file prior to visit.     No Known Allergies   Objective:  BP 127/83 (BP Location: Right Arm, Patient Position: Sitting, Cuff Size: Large)   Pulse 80   Temp 98.2 F (36.8 C) (Oral)   Resp 16   Ht 5\' 1"  (1.549 m)   Wt 217 lb 12.8 oz (98.8 kg)   SpO2 99%   BMI 41.15 kg/m   Physical Exam  Constitutional: She appears well-developed and well-nourished.  Non-toxic appearance. She does not appear ill.  Genitourinary: Rectal exam shows external hemorrhoid (6 o'clock position.  non thrombosed). Rectal exam shows no fissure and no tenderness.  Musculoskeletal:       Right foot: There is tenderness (mild) and swelling (mild). There is normal range of motion, no bony tenderness, no crepitus and no deformity.       Feet:  Vitals reviewed.  XR right foot 04/24/2018  Normal anatomic alignment, no obvious stress fracture.   MR right foot 05/13/2018 IMPRESSION: 1. Mild dorsolateral soft tissue swelling. No acute abnormality or explanation for the patient's pain. Assessment and Plan :  1. Constipation, unspecified constipation type - Pt endorses left lower quadrant abdominal pain x 2 days. PE is unremarkable. No red flags. Vitals are stable. Not suspicious for diverticulitis or appy. Plan to treat for constipation. Advised exercise, fiber and fluids. RTC if no improvement or worsening symptoms, consider imaging - polyethylene glycol powder (GLYCOLAX/MIRALAX) powder; Take 17 g by mouth 2 (two) times daily as needed.  Dispense: 578 g; Refill: 1  2. Right foot pain - Ambulatory referral to [place], PA-C  [facility] 05/19/2018 9:54 AM  Please note: Portions of this report may have been transcribed using dragon voice recognition software. Every effort was made to ensure accuracy; however, inadvertent computerized transcription errors may be present.

## 2018-05-19 NOTE — Patient Instructions (Addendum)
You will receive a phone call to schedule an appointment with sports medicine for your foot.    For gentle treatment of constipation: Use Miralax 1-2 capfuls a day until your stools are soft and regular and then decrease the usage - you can use this daily   For constipation  Look up "6 Reasons to Care about [place]" at [employer] (WindowBlog.ch)    1) Water: Make sure you are drinking enough water daily - about 1-3 liters. 2) Fiber: Make sure you are getting enough fiber in your diet - this will make you regular - you can eat high fiber foods or use metamucil as a supplement - it is really important to drink enough water when using fiber supplements. Foods that have a lot of fiber include vegetables, fruits, beans, nuts, oatmeal, and some breads and cereals. You can tell how much fiber is in a food by reading the nutrition label. Doctors recommend eating 25 to 36 grams of fiber each day. 3) Fitness: Increasing your physical activity will help increase the natural movement of your bowels. Try to get 20-30 minutes of exercise daily.  4) Use a 9" stool in front of your toilet to rest your feet on while you have a bowel movement. This will loosen your rectal muscles to help your stool come out easier and prevent straining.   ?Refrain from straining or lingering (eg, reading) on the toilet.  ?If possible, patients should avoid medications that can cause constipation or diarrhea. ?Limit intake of fatty foods and alcohol, which can exacerbate constipation.   Thank you for coming in today. I hope you feel we met your needs.  Feel free to call PCP if you have any questions or further requests.  Please consider signing up for MyChart if you do not already have it, as this is a great way to communicate with me.  Best,  Whitney McVey, PA-C   IF you received an x-ray today, you will receive an invoice from [facility]. Please contact [facility]  Radiology at [phone] with questions or concerns regarding your invoice.   IF you received labwork today, you will receive an invoice from [facility]. Please contact [facility] at [phone] with questions or concerns regarding your invoice.   Our billing staff will not be able to assist you with questions regarding bills from these companies.  You will be contacted with the lab results as soon as they are available. The fastest way to get your results is to activate your My Chart account. Instructions are located on the last page of this paperwork. If you have not heard from [place] regarding the results in 2 weeks, please contact this office.

## 2018-05-22 ENCOUNTER — Other Ambulatory Visit (INDEPENDENT_AMBULATORY_CARE_PROVIDER_SITE_OTHER): Payer: Self-pay | Admitting: Family Medicine

## 2018-05-24 ENCOUNTER — Other Ambulatory Visit: Payer: Self-pay | Admitting: Physician Assistant

## 2018-05-24 DIAGNOSIS — R05 Cough: Secondary | ICD-10-CM

## 2018-05-24 DIAGNOSIS — R059 Cough, unspecified: Secondary | ICD-10-CM

## 2018-05-24 DIAGNOSIS — R0602 Shortness of breath: Secondary | ICD-10-CM

## 2018-05-24 DIAGNOSIS — R062 Wheezing: Secondary | ICD-10-CM

## 2018-05-24 MED ORDER — ALBUTEROL SULFATE HFA 108 (90 BASE) MCG/ACT IN AERS: 2.0000 | INHALATION_SPRAY | RESPIRATORY_TRACT | 2 refills | Status: DC | PRN

## 2018-05-26 ENCOUNTER — Encounter: Payer: Self-pay | Admitting: General Practice

## 2018-05-28 ENCOUNTER — Other Ambulatory Visit (INDEPENDENT_AMBULATORY_CARE_PROVIDER_SITE_OTHER): Payer: Self-pay | Admitting: Family Medicine

## 2018-05-29 ENCOUNTER — Other Ambulatory Visit (INDEPENDENT_AMBULATORY_CARE_PROVIDER_SITE_OTHER): Payer: Self-pay | Admitting: Family Medicine

## 2018-05-29 MED ORDER — TIZANIDINE HCL 2 MG PO TABS: 2.0000 mg | ORAL_TABLET | Freq: Every evening | ORAL | 1 refills | Status: DC | PRN

## 2018-06-01 ENCOUNTER — Encounter: Payer: Self-pay | Admitting: Physician Assistant

## 2018-06-02 ENCOUNTER — Ambulatory Visit: Payer: Federal, State, Local not specified - PPO | Admitting: Family Medicine

## 2018-06-05 ENCOUNTER — Other Ambulatory Visit: Payer: Self-pay | Admitting: Physician Assistant

## 2018-06-05 DIAGNOSIS — R21 Rash and other nonspecific skin eruption: Secondary | ICD-10-CM

## 2018-06-05 MED ORDER — DESONIDE 0.05 % EX LOTN: TOPICAL_LOTION | Freq: Two times a day (BID) | CUTANEOUS | 1 refills | Status: DC

## 2018-06-08 DIAGNOSIS — M79671 Pain in right foot: Secondary | ICD-10-CM | POA: Diagnosis not present

## 2018-06-09 ENCOUNTER — Encounter: Payer: Self-pay | Admitting: Physician Assistant

## 2018-06-12 ENCOUNTER — Other Ambulatory Visit: Payer: Self-pay | Admitting: Physician Assistant

## 2018-06-12 DIAGNOSIS — R21 Rash and other nonspecific skin eruption: Secondary | ICD-10-CM

## 2018-06-12 DIAGNOSIS — M79671 Pain in right foot: Secondary | ICD-10-CM | POA: Diagnosis not present

## 2018-06-12 MED ORDER — DESONIDE 0.05 % EX CREA: TOPICAL_CREAM | Freq: Two times a day (BID) | CUTANEOUS | 1 refills | Status: AC

## 2018-06-14 ENCOUNTER — Ambulatory Visit: Payer: Federal, State, Local not specified - PPO | Admitting: Sports Medicine

## 2018-06-14 ENCOUNTER — Ambulatory Visit: Payer: Self-pay

## 2018-06-14 ENCOUNTER — Encounter: Payer: Self-pay | Admitting: Sports Medicine

## 2018-06-14 VITALS — BP 122/82 | HR 80 | Ht 61.0 in | Wt 214.8 lb

## 2018-06-14 DIAGNOSIS — M25571 Pain in right ankle and joints of right foot: Secondary | ICD-10-CM | POA: Diagnosis not present

## 2018-06-14 DIAGNOSIS — M79671 Pain in right foot: Secondary | ICD-10-CM

## 2018-06-14 NOTE — Progress Notes (Signed)
Cheyenne [name]. , [facility]  Cheyenne [name] - 37 y.o. female MRN [id number]  Date of birth: 03-Jun-1981  Visit Date: 06/14/2018  PCP: Patient, No Pcp Per   Referred by: No ref. provider found   Scribe(s) for today's visit: Josepha Pigg, CMA  SUBJECTIVE:  Cheyenne [name] is here for Follow-up (L foot pain - last seen at [facility] in 2017. )   HPI: 06/14/2018: Her L foot pain symptoms INITIALLY: Began about 3 months ago and MOI is unknown.  Described as moderate burning and cramping, nonradiating Worsened with prolonged periods of standing, standing on toes Improved with rest sometimes.  Additional associated symptoms include: Pain started as cramping on the top of the foot. Pain comes and goes. Pain is located on the top of the foot and lateral aspect of the foot. She has noticed increased tightness in the calf. She notes some redness but thinks that's because they've been "working on it". She denies increased warmth. She has hx of DVT in R leg. She does note some swelling in the foot but denies swelling around the ankle.     At this time symptoms show no change compared to onset. She has been to PT, first visit was this past Thursday, [facility]. She has tried icing her foot, rolling her foot and leg with a tennis ball, and stretching the calves. She gets some relief after using tennis ball for her leg. She has taken IBU 800 mg and Tizanidine with minimal relief. She has taken Etodolac with no relief. She is also taking Vit D 5,000 IU daily. She has also tried rest and elevation with no relief.   She has XR x 2 of the L foot and MRI 05/13/2018.    REVIEW OF SYSTEMS: Denies night time disturbances. Denies fevers, chills, or night sweats. Denies unexplained weight loss. Denies personal history of cancer. Denies changes in bowel or bladder habits. Denies recent unreported falls. Denies new or  worsening dyspnea or wheezing. Denies headaches or dizziness.  Denies numbness, tingling or weakness in the extremities.  Denies dizziness or presyncopal episodes Reports lower extremity edema    HISTORY:  Prior history reviewed and updated per electronic medical record.  Social History   Occupational History  . Occupation: Claims Rep  Tobacco Use  . Smoking status: Never Smoker  . Smokeless tobacco: Never Used  Substance and Sexual Activity  . Alcohol use: Yes    Alcohol/week: 1.0 standard drinks    Types: 1 Standard drinks or equivalent per week    Comment: social drinker, 1 a month  . Drug use: No  . Sexual activity: Yes   Social History   Social History Narrative   Married. Education: [employer]. Exercise: No.     DATA OBTAINED & REVIEWED:  No results for input(s): HGBA1C, LABURIC, CREATINE in the last 8760 hours. No problems updated. Marland Kitchen MRI of the foot on 05/13/2018: Effectively normal with only a small amount of subcutaneous edema.   OBJECTIVE:  VS:  HT:5\' 1"  (154.9 cm)   WT:214 lb 12.8 oz (97.4 kg)  BMI:40.61    BP:122/82  HR:80bpm  TEMP: ( )  RESP:98 %   PHYSICAL EXAM: CONSTITUTIONAL: Well-developed, Well-nourished and In no acute distress PSYCHIATRIC: Alert & appropriately interactive. and Not depressed or anxious appearing. RESPIRATORY: No increased work of breathing and Trachea Midline EYES: Pupils are equal., EOM intact without nystagmus. and No scleral icterus.  VASCULAR EXAM: Warm  and well perfused Pulses: DP Pulses: Bilaterally normal and symmetric PT Pulses: Bilaterally normal and symmetric Edema: 1+ pitting edema But focally along the medial aspect of the tibia and foot.  No focal calf swelling.  Calves are symmetric. NEURO: unremarkable  MSK Exam: Right leg  Well aligned, large soft tissue envelope. No overlying skin changes. No focal bony tenderness   RANGE OF MOTION & STRENGTH  Normal, non-painful Ankle dorsiflexion and plantarflexion.   Knee flexion extension   SPECIALITY TESTING:  No significant pain with straight leg raise or popliteal compression test.  Lower externally strength is intact.  No significant pain with calf squeeze.  Ligamentously stable knee and normal ligamentously stable ankle with some midfoot stiffness that articulates spontaneously with midfoot motion.     ASSESSMENT   1. Arthralgia of right foot   2. Right foot pain     PLAN:  Pertinent additional documentation may be included in corresponding procedure notes, imaging studies, problem based documentation and patient instructions.  Procedures:  . None  Medications:  No orders of the defined types were placed in this encounter.  Discussion/Instructions: No problem-specific Assessment & Plan notes found for this encounter.  . No obvious findings of pain and swelling but does appear that she has a distribution that is likely reflective of a lymphatic injury at the midportion of the shin which could be secondary to indirect trauma to this area such as kicking her shin while active or walking.  Given the overall reassuring findings and slow but steady improvement with physical therapy recommend she continue doing these treatments and follow-up for reevaluation. . Compression socks will be beneficial to helping with the pain and swelling. . Continue working on body habitus . Discussed red flag symptoms that warrant earlier emergent evaluation and patient voices understanding. . Activity modifications and the importance of avoiding exacerbating activities (limiting pain to no more than a 4 / 10 during or following activity) recommended and discussed. . >50% of this 25 minutes minute visit spent in direct patient counseling and/or coordination of care. Discussion was focused on education regarding the in discussing the pathoetiology and anticipated clinical course of the above condition.   Follow-up:  . Return in about 4 weeks (around 07/12/2018) for  repeat clinical exam.  . If any lack of improvement: consider further diagnostic evaluation with Formal venous Doppler if any worsening pain or swelling     CMA/ATC served as scribe during this visit. History, Physical, and Plan performed by medical provider. Documentation and orders reviewed and attested to.      Gerda Diss, [facility] Physician

## 2018-06-14 NOTE — Progress Notes (Signed)
LIMITED MSK ULTRASOUND OF Right tibia Images were obtained and interpreted by myself, Teresa Coombs, DO  Images have been saved and stored to PACS system. Images obtained on: GE S7 Ultrasound machine  FINDINGS:   Small amount of subcutaneous edema just distal to the middle of the tibia along the medial border tracking down into the dorsum of the foot.  At the level severely has been varicose again on tissues show no overt vascular findings.  At the level of the knee the popliteal vessels are compressible and patent.  Otherwise no focal findings appreciated.  IMPRESSION:  1. Subcutaneous edema tracking from the middle of the medial tibia possibly reflective of direct trauma with lymphatic damage without evidence of obvious blood clot or lymphadenopathy.

## 2018-06-14 NOTE — Patient Instructions (Addendum)
Look into getting compression socks over-the-counter to be worn when you are awake.  Should continue to improve over the next several weeks.  Also

## 2018-06-15 DIAGNOSIS — M79671 Pain in right foot: Secondary | ICD-10-CM | POA: Diagnosis not present

## 2018-07-12 ENCOUNTER — Encounter: Payer: Self-pay | Admitting: Sports Medicine

## 2018-07-12 ENCOUNTER — Ambulatory Visit: Payer: Federal, State, Local not specified - PPO | Admitting: Sports Medicine

## 2018-07-12 VITALS — BP 120/74 | HR 108 | Ht 61.0 in | Wt 215.6 lb

## 2018-07-12 DIAGNOSIS — M25571 Pain in right ankle and joints of right foot: Secondary | ICD-10-CM

## 2018-07-12 DIAGNOSIS — M79671 Pain in right foot: Secondary | ICD-10-CM | POA: Diagnosis not present

## 2018-08-21 DIAGNOSIS — K080 Exfoliation of teeth due to systemic causes: Secondary | ICD-10-CM | POA: Diagnosis not present

## 2018-09-08 NOTE — Progress Notes (Signed)
Cheyenne [name]. [place], [place] at [facility] [phone]  Cheyenne [name] - 38 y.o. female MRN [id number]  Date of birth: 05/24/1981  Visit Date: 07/12/2018  PCP: Patient, No Pcp Per   Referred by: No ref. provider found  SUBJECTIVE:   Chief Complaint  Patient presents with  . f/u R foot pain    Sx are improving. She stopped PT around Thanksgiving. She has been doing HEP and rolling feet. Takes IBU prn. D/c Vit D and Tizanidine. She has been wearing compression with some relief.     HPI: Patient is here for follow-up of her right foot.  She overall is happy with her progress and has been able to discontinue medications.  She has been working with Rexene Agent previously but has been able to progress to just doing home exercises.  She has been rolling her feet and performing calf stretching using compression socks and ibuprofen.  Symptoms are minimal at this time.  REVIEW OF SYSTEMS: No significant nighttime awakenings due to this issue. Denies fevers, chills, recent weight gain or weight loss.  No night sweats.  Pt denies any change in bowel or bladder habits, muscle weakness, numbness or falls associated with this pain. Otherwise 12 point review of systems performed and is negative   HISTORY:  Prior history reviewed and updated per electronic medical record.  There are no active problems to display for this patient.  Social History   Occupational History  . Occupation: Claims Rep  Tobacco Use  . Smoking status: Never Smoker  . Smokeless tobacco: Never Used  Substance and Sexual Activity  . Alcohol use: Yes    Alcohol/week: 1.0 standard drinks    Types: 1 Standard drinks or equivalent per week    Comment: social drinker, 1 a month  . Drug use: No  . Sexual activity: Yes   Social History   Social History Narrative   Married. Education: [employer]. Exercise: No.     OBJECTIVE:  VS:  HT:5\' 1"  (154.9 cm)   WT:215 lb 9.6 oz  (97.8 kg)  BMI:40.76    BP:120/74  HR:(!) 108bpm  TEMP: ( )  RESP:97 %   PHYSICAL EXAM: Adult female.  In no acute distress.  Alert and appropriate. She has minimal pain with palpation of the plantar aspect of her foot.  She has minimal pain palpation around the ankle.  There is no appreciable swelling.  Intrinsic foot and ankle strength is intact.   ASSESSMENT:   1. Arthralgia of right foot   2. Right foot pain     PROCEDURES:  None  PLAN:  Pertinent additional documentation may be included in corresponding procedure notes, imaging studies, problem based documentation and patient instructions.  No problem-specific Assessment & Plan notes found for this encounter.   Overall she has made good progress.  No focal symptoms at this time.  I will have her continue with the home therapeutic exercises to resolution and intermittently to reduce her risk of recurrence.  Continue previously prescribed home exercise program.   Activity modifications and the importance of avoiding exacerbating activities (limiting pain to no more than a 4 / 10 during or following activity) recommended and discussed.  Discussed red flag symptoms that warrant earlier emergent evaluation and patient voices understanding.   Return if symptoms worsen or fail to improve.          Cheyenne [name], [facility] Phys[facility]

## 2018-09-18 DIAGNOSIS — Z6841 Body Mass Index (BMI) 40.0 and over, adult: Secondary | ICD-10-CM | POA: Diagnosis not present

## 2018-09-18 DIAGNOSIS — Z01419 Encounter for gynecological examination (general) (routine) without abnormal findings: Secondary | ICD-10-CM | POA: Diagnosis not present

## 2018-09-18 DIAGNOSIS — Z1151 Encounter for screening for human papillomavirus (HPV): Secondary | ICD-10-CM | POA: Diagnosis not present

## 2018-10-13 ENCOUNTER — Encounter: Payer: Self-pay | Admitting: Physician Assistant

## 2018-10-16 ENCOUNTER — Other Ambulatory Visit: Payer: Self-pay | Admitting: *Deleted

## 2018-10-16 DIAGNOSIS — R05 Cough: Secondary | ICD-10-CM

## 2018-10-16 DIAGNOSIS — Z9109 Other allergy status, other than to drugs and biological substances: Secondary | ICD-10-CM

## 2018-10-16 DIAGNOSIS — R059 Cough, unspecified: Secondary | ICD-10-CM

## 2018-10-16 MED ORDER — CETIRIZINE HCL 10 MG PO TABS: 10.0000 mg | ORAL_TABLET | Freq: Every day | ORAL | 0 refills | Status: DC

## 2018-10-18 ENCOUNTER — Ambulatory Visit: Payer: Self-pay

## 2018-10-18 NOTE — Telephone Encounter (Signed)
Patient called in with c/o "wheezing, SOB." She says "right now, I'm fine. No problems. I just have bronchitis and use inhalers. The inhalers seem to make my chest tight when I used it, so I didn't know if it was a reaction or not. I sent a MyChart message to the office and was told by Almyra Free to schedule an appointment to be seen." I asked when did the breathing problem start, she says "a couple of days ago and it was moderate when it happens." I asked about other symptoms, she denies fever, cough, chest pain, runny nose, dizziness. I asked about travel and exposure, she says "I have not traveled and have not been around anyone who has, that I know of, and not exposed to anyone with coronavirus, that I know of." According to protocol, see PCP within 3 days, appointment scheduled for tomorrow, 10/19/18 at 1500 with Dr. Nolon Rod, care advice given, patient verbalized understanding.  Reason for Disposition . [1] MODERATE longstanding difficulty breathing (e.g., speaks in phrases, SOB even at rest, pulse 100-120) AND [2] SAME as normal  Answer Assessment - Initial Assessment Questions 1. RESPIRATORY STATUS: "Describe your breathing?" (e.g., wheezing, shortness of breath, unable to speak, severe coughing)      Wheezing, SOB (none right now) 2. ONSET: "When did this breathing problem begin?"      A couple of days ago 3. PATTERN "Does the difficult breathing come and go, or has it been constant since it started?"      Comes and goes 4. SEVERITY: "How bad is your breathing?" (e.g., mild, moderate, severe)    - MILD: No SOB at rest, mild SOB with walking, speaks normally in sentences, can lay down, no retractions, pulse < 100.    - MODERATE: SOB at rest, SOB with minimal exertion and prefers to sit, cannot lie down flat, speaks in phrases, mild retractions, audible wheezing, pulse 100-120.    - SEVERE: Very SOB at rest, speaks in single words, struggling to breathe, sitting hunched forward, retractions, pulse >  120      Moderate when it happens, but no SOB at present 5. RECURRENT SYMPTOM: "Have you had difficulty breathing before?" If so, ask: "When was the last time?" and "What happened that time?"      Yes, bronchitis on inhalers prn 6. CARDIAC HISTORY: "Do you have any history of heart disease?" (e.g., heart attack, angina, bypass surgery, angioplasty)      No 7. LUNG HISTORY: "Do you have any history of lung disease?"  (e.g., pulmonary embolus, asthma, emphysema)     Bronchitis 8. CAUSE: "What do you think is causing the breathing problem?"      Bronchitis flaring up 9. OTHER SYMPTOMS: "Do you have any other symptoms? (e.g., dizziness, runny nose, cough, chest pain, fever)     No right now, did have a stuffy nose 10. PREGNANCY: "Is there any chance you are pregnant?" "When was your last menstrual period?"       No, on continuous birth control 87. TRAVEL: "Have you traveled out of the country in the last month?" (e.g., travel history, exposures)       No, no exposure to COVID-19 or high risk areas  Protocols used: BREATHING DIFFICULTY-A-AH

## 2018-10-19 ENCOUNTER — Other Ambulatory Visit: Payer: Self-pay

## 2018-10-19 ENCOUNTER — Ambulatory Visit: Payer: BLUE CROSS/BLUE SHIELD | Admitting: Family Medicine

## 2018-10-19 ENCOUNTER — Encounter: Payer: Self-pay | Admitting: Family Medicine

## 2018-10-19 VITALS — BP 118/78 | HR 105 | Temp 98.6°F | Resp 16 | Ht 61.0 in | Wt 219.0 lb

## 2018-10-19 DIAGNOSIS — J069 Acute upper respiratory infection, unspecified: Secondary | ICD-10-CM | POA: Diagnosis not present

## 2018-10-19 DIAGNOSIS — R05 Cough: Secondary | ICD-10-CM | POA: Diagnosis not present

## 2018-10-19 DIAGNOSIS — R062 Wheezing: Secondary | ICD-10-CM | POA: Diagnosis not present

## 2018-10-19 DIAGNOSIS — R059 Cough, unspecified: Secondary | ICD-10-CM

## 2018-10-19 MED ORDER — CARBAMIDE PEROXIDE 6.5 % OT SOLN: 5.0000 [drp] | Freq: Two times a day (BID) | OTIC | 0 refills | Status: DC

## 2018-10-19 MED ORDER — AZITHROMYCIN 250 MG PO TABS: ORAL_TABLET | ORAL | 0 refills | Status: DC

## 2018-10-19 NOTE — Progress Notes (Signed)
Established Patient Office Visit  Subjective:  Patient ID: Cheyenne [name], female    DOB: [birth year]  Age: 38 y.o. MRN: [id number]  CC:  Chief Complaint  Patient presents with  . Cough    with some SHOB x 1 week     HPI Cheyenne [name] presents for   Patient reports that she she was coughing for the last 4 days but has not coughed today She reports that she has been wheezing intermittently every day She denies a history of asthma She denies fevers She reports feeling shortness of breath when she coughs She states that the cough is dry She denies recent travel She denies sick contacts She denies nausea or vomiting  No rashes  She sleeps with pillow to prop up her head She takes otc allergy med and an albuterol inhaler that she was prescribed from previous episode of bronchitis. Last episode was 1-2 years ago. She has not had any water to drink today She used her albuterol about an hour ago    Past Medical History:  Diagnosis Date  . Anxiety     History reviewed. No pertinent surgical history.  Family History  Problem Relation Age of Onset  . Hypertension Mother     Social History   Socioeconomic History  . Marital status: Single    Spouse name: Not on file  . Number of children: Not on file  . Years of education: Not on file  . Highest education level: Not on file  Occupational History  . Occupation: Claims Rep  Social Needs  . Financial resource strain: Not on file  . Food insecurity:    Worry: Not on file    Inability: Not on file  . Transportation needs:    Medical: Not on file    Non-medical: Not on file  Tobacco Use  . Smoking status: Never Smoker  . Smokeless tobacco: Never Used  Substance and Sexual Activity  . Alcohol use: Yes    Alcohol/week: 1.0 standard drinks    Types: 1 Standard drinks or equivalent per week    Comment: social drinker, 1 a month  . Drug use: No  . Sexual activity: Yes  Lifestyle  . Physical activity:    Days  per week: Not on file    Minutes per session: Not on file  . Stress: Not on file  Relationships  . Social connections:    Talks on phone: Not on file    Gets together: Not on file    Attends religious service: Not on file    Active member of club or organization: Not on file    Attends meetings of clubs or organizations: Not on file    Relationship status: Not on file  . Intimate partner violence:    Fear of current or ex partner: Not on file    Emotionally abused: Not on file    Physically abused: Not on file    Forced sexual activity: Not on file  Other Topics Concern  . Not on file  Social History Narrative   Married. Education: [employer]. Exercise: No.    Outpatient Medications Prior to Visit  Medication Sig Dispense Refill  . ALAYCEN 1/35 tablet TAKE 1 (ONE) TABLET DAILY, CONTINUOUSLY  4  . albuterol (PROVENTIL HFA;VENTOLIN HFA) 108 (90 Base) MCG/ACT inhaler Inhale 2 puffs into the lungs every 4 (four) hours as needed for wheezing. 1 Inhaler 2  . ALPRAZolam (XANAX) 0.5 MG tablet Take 1 tablet (0.5 mg total) by mouth  2 (two) times daily as needed for anxiety. 10 tablet 0  . cetirizine (ZYRTEC) 10 MG tablet Take 1 tablet (10 mg total) by mouth daily. 90 tablet 0  . desonide (DESOWEN) 0.05 % cream Apply topically 2 (two) times daily. 30 g 1  . ibuprofen (ADVIL,MOTRIN) 800 MG tablet Take 1 tablet (800 mg total) by mouth every 8 (eight) hours as needed. 90 tablet 3  . polyethylene glycol powder (GLYCOLAX/MIRALAX) powder Take 17 g by mouth 2 (two) times daily as needed. 578 g 1  . Cholecalciferol (VITAMIN D-3) 5000 units TABS Take 1 tablet by mouth daily. 90 tablet 3   No facility-administered medications prior to visit.     No Known Allergies  ROS Review of Systems Review of Systems  Constitutional: Negative for activity change, appetite change, chills and fever.  HENT: Negative for congestion, nosebleeds, trouble swallowing and voice change.   Respiratory: Negative for  cough, shortness of breath and wheezing.   Gastrointestinal: Negative for diarrhea, nausea and vomiting.  Genitourinary: Negative for difficulty urinating, dysuria, flank pain and hematuria.  Musculoskeletal: Negative for back pain, joint swelling and neck pain.  Neurological: Negative for dizziness, speech difficulty, light-headedness and numbness.  See HPI. All other review of systems negative.     Objective:    Physical Exam  BP 118/78   Pulse (!) 105   Temp 98.6 F (37 C) (Oral)   Resp 16   Ht 5\' 1"  (1.549 m)   Wt 219 lb (99.3 kg)   SpO2 98%   BMI 41.38 kg/m  Wt Readings from Last 3 Encounters:  10/19/18 219 lb (99.3 kg)  07/12/18 215 lb 9.6 oz (97.8 kg)  06/14/18 214 lb 12.8 oz (97.4 kg)   General: alert, oriented, in NAD Head: normocephalic, atraumatic, no sinus tenderness Eyes: EOM intact, no scleral icterus or conjunctival injection Ears: tm obscured bilaterally by impacted cerumen Nose: mucosa nonerythematous, nonedematous Throat: no pharyngeal exudate or erythema Lymph: no posterior auricular, submental or cervical lymph adenopathy Heart: no murmurs, sinus tachycardia Lungs: clear to auscultation bilaterally, no wheezing    Health Maintenance Due  Topic Date Due  . HIV Screening  08/15/1996  . PAP SMEAR-Modifier  09/02/2017    There are no preventive care reminders to display for this patient.  Lab Results  Component Value Date   TSH 2.501 12/24/2014   Lab Results  Component Value Date   WBC 11.0 (H) 12/24/2014   HGB 12.6 12/24/2014   HCT 39.2 12/24/2014   MCV 84.8 12/24/2014   PLT 433 (H) 12/24/2014   Lab Results  Component Value Date   NA 138 12/24/2014   K 4.3 12/24/2014   CO2 23 12/24/2014   GLUCOSE 96 12/24/2014   BUN 15 12/24/2014   CREATININE 0.83 12/24/2014   BILITOT 0.4 12/24/2014   ALKPHOS 62 12/24/2014   AST 16 12/24/2014   ALT 18 12/24/2014   PROT 6.6 12/24/2014   ALBUMIN 3.8 12/24/2014   CALCIUM 8.6 12/24/2014   Lab  Results  Component Value Date   CHOL 192 12/24/2014   Lab Results  Component Value Date   HDL 46 12/24/2014   Lab Results  Component Value Date   LDLCALC 125 (H) 12/24/2014   Lab Results  Component Value Date   TRIG 104 12/24/2014   Lab Results  Component Value Date   CHOLHDL 4.2 12/24/2014   No results found for: HGBA1C    Assessment & Plan:   Problem List Items Addressed This  Visit    None    Visit Diagnoses    Cough    -  Primary   Wheezing       Acute URI         Acute Upper Respiratory infection Discussed that she should work from home if she can Will give zpak to take should her symptoms progress to become more of a acute bronchitis/sinusitis She was given a handout to describe symptoms She should follow up prn Symptomatic treatment advised   No orders of the defined types were placed in this encounter.   Follow-up: No follow-ups on file.    Forrest Moron, MD

## 2018-10-19 NOTE — Patient Instructions (Addendum)
If you have lab work done today you will be contacted with your lab results within the next 2 weeks.  If you have not heard from [place] then please contact us. The fastest way to get your results is to register for My Chart.   IF you received an x-ray today, you will receive an invoice from [facility]. Please contact [facility] at [phone] with questions or concerns regarding your invoice.   IF you received labwork today, you will receive an invoice from [place]. Please contact [facility] at [phone] with questions or concerns regarding your invoice.   Our billing staff will not be able to assist you with questions regarding bills from these companies.  You will be contacted with the lab results as soon as they are available. The fastest way to get your results is to activate your My Chart account. Instructions are located on the last page of this paperwork. If you have not heard from [place] regarding the results in 2 weeks, please contact this office.      Upper Respiratory Infection, Adult An upper respiratory infection (URI) is a common viral infection of the nose, throat, and upper air passages that lead to the lungs. The most common type of URI is the common cold. URIs usually get better on their own, without medical treatment. What are the causes? A URI is caused by a virus. You may catch a virus by:  Breathing in droplets from an infected person's cough or sneeze.  Touching something that has been exposed to the virus (contaminated) and then touching your mouth, nose, or eyes. What increases the risk? You are more likely to get a URI if:  You are very young or very old.  It is autumn or winter.  You have close contact with others, such as at a daycare, school, or health care facility.  You smoke.  You have long-term (chronic) heart or lung disease.  You have a weakened disease-fighting (immune) system.  You have nasal allergies or asthma.  You  are experiencing a lot of stress.  You work in an area that has poor air circulation.  You have poor nutrition. What are the signs or symptoms? A URI usually involves some of the following symptoms:  Runny or stuffy (congested) nose.  Sneezing.  Cough.  Sore throat.  Headache.  Fatigue.  Fever.  Loss of appetite.  Pain in your forehead, behind your eyes, and over your cheekbones (sinus pain).  Muscle aches.  Redness or irritation of the eyes.  Pressure in the ears or face. How is this diagnosed? This condition may be diagnosed based on your medical history and symptoms, and a physical exam. Your health care provider may use a cotton swab to take a mucus sample from your nose (nasal swab). This sample can be tested to determine what virus is causing the illness. How is this treated? URIs usually get better on their own within 7-10 days. You can take steps at home to relieve your symptoms. Medicines cannot cure URIs, but your health care provider may recommend certain medicines to help relieve symptoms, such as:  Over-the-counter cold medicines.  Cough suppressants. Coughing is a type of defense against infection that helps to clear the respiratory system, so take these medicines only as recommended by your health care provider.  Fever-reducing medicines. Follow these instructions at home: Activity  Rest as needed.  If you have a fever, stay home from work or school until your fever is  gone or until your health care provider says you are no longer contagious. Your health care provider may have you wear a face mask to prevent your infection from spreading. Relieving symptoms  Gargle with a salt-water mixture 3-4 times a day or as needed. To make a salt-water mixture, completely dissolve -1 tsp of salt in 1 cup of warm water.  Use a cool-mist humidifier to add moisture to the air. This can help you breathe more easily. Eating and drinking   Drink enough fluid to  keep your urine pale yellow.  Eat soups and other clear broths. General instructions   Take over-the-counter and prescription medicines only as told by your health care provider. These include cold medicines, fever reducers, and cough suppressants.  Do not use any products that contain nicotine or tobacco, such as cigarettes and e-cigarettes. If you need help quitting, ask your health care provider.  Stay away from secondhand smoke.  Stay up to date on all immunizations, including the yearly (annual) flu vaccine.  Keep all follow-up visits as told by your health care provider. This is important. How to prevent the spread of infection to others   URIs can be passed from person to person (are contagious). To prevent the infection from spreading: ? Wash your hands often with soap and water. If soap and water are not available, use hand sanitizer. ? Avoid touching your mouth, face, eyes, or nose. ? Cough or sneeze into a tissue or your sleeve or elbow instead of into your hand or into the air. Contact a health care provider if:  You are getting worse instead of better.  You have a fever or chills.  Your mucus is brown or red.  You have yellow or brown discharge coming from your nose.  You have pain in your face, especially when you bend forward.  You have swollen neck glands.  You have pain while swallowing.  You have white areas in the back of your throat. Get help right away if:  You have shortness of breath that gets worse.  You have severe or persistent: ? Headache. ? Ear pain. ? Sinus pain. ? Chest pain.  You have chronic lung disease along with any of the following: ? Wheezing. ? Prolonged cough. ? Coughing up blood. ? A change in your usual mucus.  You have a stiff neck.  You have changes in your: ? Vision. ? Hearing. ? Thinking. ? Mood. Summary  An upper respiratory infection (URI) is a common infection of the nose, throat, and upper air passages  that lead to the lungs.  A URI is caused by a virus.  URIs usually get better on their own within 7-10 days.  Medicines cannot cure URIs, but your health care provider may recommend certain medicines to help relieve symptoms. This information is not intended to replace advice given to you by your health care provider. Make sure you discuss any questions you have with your health care provider. Document Released: 01/12/2001 Document Revised: 03/04/2017 Document Reviewed: 03/04/2017 Elsevier Interactive Patient Education  2019 [employer]  Bronchospasm, Adult  Bronchospasm is a tightening of the airways going into the lungs. During an episode, it may be harder to breathe. You may cough, and you may make a whistling sound when you breathe (wheeze). This condition often affects people with asthma. What are the causes? This condition is caused by swelling and irritation in the airways. It can be triggered by:  An infection (common).  Seasonal allergies.  An  allergic reaction.  Exercise.  Irritants. These include pollution, cigarette smoke, strong odors, aerosol sprays, and paint fumes.  Weather changes. Winds increase molds and pollens in the air. Cold air may cause swelling.  Stress and emotional upset. What are the signs or symptoms? Symptoms of this condition include:  Wheezing. If the episode was triggered by an allergy, wheezing may start right away or hours later.  Nighttime coughing.  Frequent or severe coughing with a simple cold.  Chest tightness.  Shortness of breath.  Decreased ability to exercise. How is this diagnosed? This condition is usually diagnosed with a review of your medical history and a physical exam. Tests, such as lung function tests, are sometimes done to look for other conditions. The need for a chest X-ray depends on where the wheezing occurs and whether it is the first time you have wheezed. How is this treated? This condition may be treated  with:  Inhaled medicines. These open up the airways and help you breathe. They can be taken with an inhaler or a nebulizer device.  Corticosteroid medicines. These may be given for severe bronchospasm, usually when it is associated with asthma.  Avoiding triggers, such as irritants, infection, or allergies. Follow these instructions at home: Medicines  Take over-the-counter and prescription medicines only as told by your health care provider.  If you need to use an inhaler or nebulizer to take your medicine, ask your health care provider to explain how to use it correctly. If you were given a spacer, always use it with your inhaler. Lifestyle  Reduce the number of triggers in your home. To do this: ? Change your heating and air conditioning filter at least once a month. ? Limit your use of fireplaces and wood stoves. ? Do not smoke. Do not allow smoking in your home. ? Avoid using perfumes and fragrances. ? Get rid of pests, such as roaches and mice, and their droppings. ? Remove any mold from your home. ? Keep your house clean and dust free. Use unscented cleaning products. ? Replace carpet with wood, tile, or vinyl flooring. Carpet can trap dander and dust. ? Use allergy-proof pillows, mattress covers, and box spring covers. ? Wash bed sheets and blankets every week in hot water. Dry them in a dryer. ? Use blankets that are made of polyester or cotton. ? Wash your hands often. ? Do not allow pets in your bedroom.  Avoid breathing in cold air when you exercise. General instructions  Have a plan for seeking medical care. Know when to call your health care provider and local emergency services, and where to get emergency care.  Stay up to date on your immunizations.  When you have an episode of bronchospasm, stay calm. Try to relax and breathe more slowly.  If you have asthma, make sure you have an asthma action plan.  Keep all follow-up visits as told by your health care  provider. This is important. Contact a health care provider if:  You have muscle aches.  You have chest pain.  The mucus that you cough up (sputum) changes from clear or white to yellow, green, gray, or bloody.  You have a fever.  Your sputum gets thicker. Get help right away if:  Your wheezing and coughing get worse, even after you take your prescribed medicines.  It gets even harder to breathe.  You develop severe chest pain. Summary  Bronchospasm is a tightening of the airways going into the lungs.  During an episode of  bronchospasm, you may have a harder time breathing. You may cough and make a whistling sound when you breathe (wheeze).  Avoid exposure to triggers such as smoke, dust, mold, animal dander, and fragrances.  When you have an episode of bronchospasm, stay calm. Try to relax and breathe more slowly. This information is not intended to replace advice given to you by your health care provider. Make sure you discuss any questions you have with your health care provider. Document Released: 07/22/2003 Document Revised: 07/15/2016 Document Reviewed: 07/15/2016 Elsevier Interactive Patient Education  2019 Else[employer] Inc.  Sinusitis, Adult Sinusitis is inflammation of your sinuses. Sinuses are hollow spaces in the bones around your face. Your sinuses are located:  Around your eyes.  In the middle of your forehead.  Behind your nose.  In your cheekbones. Mucus normally drains out of your sinuses. When your nasal tissues become inflamed or swollen, mucus can become trapped or blocked. This allows bacteria, viruses, and fungi to grow, which leads to infection. Most infections of the sinuses are caused by a virus. Sinusitis can develop quickly. It can last for up to 4 weeks (acute) or for more than 12 weeks (chronic). Sinusitis often develops after a cold. What are the causes? This condition is caused by anything that creates swelling in the sinuses or stops mucus from  draining. This includes:  Allergies.  Asthma.  Infection from bacteria or viruses.  Deformities or blockages in your nose or sinuses.  Abnormal growths in the nose (nasal polyps).  Pollutants, such as chemicals or irritants in the air.  Infection from fungi (rare). What increases the risk? You are more likely to develop this condition if you:  Have a weak body defense system (immune system).  Do a lot of swimming or diving.  Overuse nasal sprays.  Smoke. What are the signs or symptoms? The main symptoms of this condition are pain and a feeling of pressure around the affected sinuses. Other symptoms include:  Stuffy nose or congestion.  Thick drainage from your nose.  Swelling and warmth over the affected sinuses.  Headache.  Upper toothache.  A cough that may get worse at night.  Extra mucus that collects in the throat or the back of the nose (postnasal drip).  Decreased sense of smell and taste.  Fatigue.  A fever.  Sore throat.  Bad breath. How is this diagnosed? This condition is diagnosed based on:  Your symptoms.  Your medical history.  A physical exam.  Tests to find out if your condition is acute or chronic. This may include: ? Checking your nose for nasal polyps. ? Viewing your sinuses using a device that has a light (endoscope). ? Testing for allergies or bacteria. ? Imaging tests, such as an MRI or CT scan. In rare cases, a bone biopsy may be done to rule out more serious types of fungal sinus disease. How is this treated? Treatment for sinusitis depends on the cause and whether your condition is chronic or acute.  If caused by a virus, your symptoms should go away on their own within 10 days. You may be given medicines to relieve symptoms. They include: ? Medicines that shrink swollen nasal passages (topical intranasal decongestants). ? Medicines that treat allergies (antihistamines). ? A spray that eases inflammation of the nostrils  (topical intranasal corticosteroids). ? Rinses that help get rid of thick mucus in your nose (nasal saline washes).  If caused by bacteria, your health care provider may recommend waiting to see if your  symptoms improve. Most bacterial infections will get better without antibiotic medicine. You may be given antibiotics if you have: ? A severe infection. ? A weak immune system.  If caused by narrow nasal passages or nasal polyps, you may need to have surgery. Follow these instructions at home: Medicines  Take, use, or apply over-the-counter and prescription medicines only as told by your health care provider. These may include nasal sprays.  If you were prescribed an antibiotic medicine, take it as told by your health care provider. Do not stop taking the antibiotic even if you start to feel better. Hydrate and humidify   Drink enough fluid to keep your urine pale yellow. Staying hydrated will help to thin your mucus.  Use a cool mist humidifier to keep the humidity level in your home above 50%.  Inhale steam for 10-15 minutes, 3-4 times a day, or as told by your health care provider. You can do this in the bathroom while a hot shower is running.  Limit your exposure to cool or dry air. Rest  Rest as much as possible.  Sleep with your head raised (elevated).  Make sure you get enough sleep each night. General instructions   Apply a warm, moist washcloth to your face 3-4 times a day or as told by your health care provider. This will help with discomfort.  Wash your hands often with soap and water to reduce your exposure to germs. If soap and water are not available, use hand sanitizer.  Do not smoke. Avoid being around people who are smoking (secondhand smoke).  Keep all follow-up visits as told by your health care provider. This is important. Contact a health care provider if:  You have a fever.  Your symptoms get worse.  Your symptoms do not improve within 10 days. Get  help right away if:  You have a severe headache.  You have persistent vomiting.  You have severe pain or swelling around your face or eyes.  You have vision problems.  You develop confusion.  Your neck is stiff.  You have trouble breathing. Summary  Sinusitis is soreness and inflammation of your sinuses. Sinuses are hollow spaces in the bones around your face.  This condition is caused by nasal tissues that become inflamed or swollen. The swelling traps or blocks the flow of mucus. This allows bacteria, viruses, and fungi to grow, which leads to infection.  If you were prescribed an antibiotic medicine, take it as told by your health care provider. Do not stop taking the antibiotic even if you start to feel better.  Keep all follow-up visits as told by your health care provider. This is important. This information is not intended to replace advice given to you by your health care provider. Make sure you discuss any questions you have with your health care provider. Document Released: 07/19/2005 Document Revised: 12/19/2017 Document Reviewed: 12/19/2017 Elsevier Interactive Patient Education  2019 [employer].

## 2018-11-06 ENCOUNTER — Telehealth (INDEPENDENT_AMBULATORY_CARE_PROVIDER_SITE_OTHER): Payer: Self-pay | Admitting: Radiology

## 2018-11-06 MED ORDER — IBUPROFEN 800 MG PO TABS: 800.0000 mg | ORAL_TABLET | Freq: Three times a day (TID) | ORAL | 3 refills | Status: DC | PRN

## 2018-11-06 NOTE — Telephone Encounter (Signed)
Sent!

## 2018-11-06 NOTE — Telephone Encounter (Signed)
Pharmacy request   Ibuprofen 800mg  tablet Qty.90 3 refills Sig: take 1 tablet by mouth every 8 hrs as needed  Pharmacy comments  this prescription is valid only if transmitted by means of a facsimile machine

## 2018-12-12 ENCOUNTER — Other Ambulatory Visit: Payer: Self-pay | Admitting: Physician Assistant

## 2018-12-12 DIAGNOSIS — R21 Rash and other nonspecific skin eruption: Secondary | ICD-10-CM

## 2018-12-15 DIAGNOSIS — M62838 Other muscle spasm: Secondary | ICD-10-CM | POA: Diagnosis not present

## 2018-12-15 DIAGNOSIS — M549 Dorsalgia, unspecified: Secondary | ICD-10-CM | POA: Diagnosis not present

## 2018-12-19 ENCOUNTER — Ambulatory Visit: Payer: Federal, State, Local not specified - PPO | Admitting: Family Medicine

## 2019-02-12 ENCOUNTER — Encounter: Payer: Self-pay | Admitting: Family Medicine

## 2019-02-12 ENCOUNTER — Ambulatory Visit (INDEPENDENT_AMBULATORY_CARE_PROVIDER_SITE_OTHER): Payer: Federal, State, Local not specified - PPO | Admitting: Family Medicine

## 2019-02-12 ENCOUNTER — Other Ambulatory Visit: Payer: Self-pay

## 2019-02-12 DIAGNOSIS — E6609 Other obesity due to excess calories: Secondary | ICD-10-CM

## 2019-02-12 DIAGNOSIS — L304 Erythema intertrigo: Secondary | ICD-10-CM | POA: Diagnosis not present

## 2019-02-12 DIAGNOSIS — R609 Edema, unspecified: Secondary | ICD-10-CM

## 2019-02-12 NOTE — Progress Notes (Signed)
Virtual Visit via Video Note  I connected with Cheyenne [name] on 02/12/19 at  4:30 PM EDT by a video enabled telemedicine application and verified that I am speaking with the correct person using two identifiers.  Location patient: home Location provider:work or home office Persons participating in the virtual visit: patient, provider  I discussed the limitations of evaluation and management by telemedicine and the availability of in person appointments. The patient expressed understanding and agreed to proceed.   HPI: Pt is a 38 yo female with plantar fasciitis seen today for est. Care.  Previously seen at [facility].  Acute concern: -notes swelling in feet and L hands after walking -may occur 1 hr after walking. -has spider veins on legs -legs gets tight, occassionally has pain -hands feel tight, rings difficult to get off. -has compression socks, but notes edema worse if wears them prior to walking. -denies joint pain, fever, SOB, CP, h/o autoimmune d/o -mom, sister, and grandmother have "issues with swelling". -pt seen by [facility], Dr. Paulla Fore for L foot pain.  Went to PT. -pt eating fast food  Chafing: -noticed last wk on thighs -caused abrasion of skin.   -used neosporin.  Allergies: NKDA  Past Surgical Hx: none  Social Hx:  Pt is married to her wife, Natividad Brood.  Pt works at the [employer].  Pt endorses social EtOH use.  Pt denies tobacco and drug use.  Family Medical Hx: Mom- LE edema, OA, DM, HTN Dad- HTN Sis-edema, DM MGM-edema, DM, HTN,   ROS: See pertinent positives and negatives per HPI.  Past Medical History:  Diagnosis Date  . Anxiety     No past surgical history on file.  Family History  Problem Relation Age of Onset  . Hypertension Mother      Current Outpatient Medications:  .  ALAYCEN 1/35 tablet, TAKE 1 (ONE) TABLET DAILY, CONTINUOUSLY, Disp: , Rfl: 4 .  albuterol (PROVENTIL HFA;VENTOLIN HFA) 108 (90 Base) MCG/ACT  inhaler, Inhale 2 puffs into the lungs every 4 (four) hours as needed for wheezing., Disp: 1 Inhaler, Rfl: 2 .  ALPRAZolam (XANAX) 0.5 MG tablet, Take 1 tablet (0.5 mg total) by mouth 2 (two) times daily as needed for anxiety., Disp: 10 tablet, Rfl: 0 .  azithromycin (ZITHROMAX) 250 MG tablet, Take 2 tabs on day 1, then one tablet each day after, Disp: 6 tablet, Rfl: 0 .  carbamide peroxide (DEBROX) 6.5 % OTIC solution, Place 5 drops into both ears 2 (two) times daily., Disp: 15 mL, Rfl: 0 .  cetirizine (ZYRTEC) 10 MG tablet, Take 1 tablet (10 mg total) by mouth daily., Disp: 90 tablet, Rfl: 0 .  desonide (DESOWEN) 0.05 % cream, Apply topically 2 (two) times daily., Disp: 30 g, Rfl: 1 .  ibuprofen (ADVIL,MOTRIN) 800 MG tablet, Take 1 tablet (800 mg total) by mouth every 8 (eight) hours as needed., Disp: 90 tablet, Rfl: 3 .  polyethylene glycol powder (GLYCOLAX/MIRALAX) powder, Take 17 g by mouth 2 (two) times daily as needed., Disp: 578 g, Rfl: 1  EXAM:  VITALS per patient if applicable:  GENERAL: alert, oriented, appears well and in no acute distress  HEENT: atraumatic, conjunctiva clear, no obvious abnormalities on inspection of external nose and ears  NECK: normal movements of the head and neck  LUNGS: on inspection no signs of respiratory distress, breathing rate appears normal, no obvious gross SOB, gasping or wheezing  CV: no obvious cyanosis  MS: moves all visible extremities without noticeable abnormality  PSYCH/NEURO: pleasant and cooperative, no obvious depression or anxiety, speech and thought processing grossly intact  ASSESSMENT AND PLAN:  Discussed the following assessment and plan:  Peripheral edema  -discussed various causes. -will obtain labs.   -Given claudication consider referral to vascular for ABIs to r/o PVD -discussed lifestyle modifications: sodium reduction, elevating LEs, TED hose, compression socks -consider autoimmune w/up  - Plan: Comprehensive  metabolic panel, CBC with Differential/Platelet, Brain Natriuretic Peptide, Hemoglobin A1c, Lipid panel  Chafing -discussed weight loss, avoiding tight clothing -ok to use ice for pain, edema  Obesity due to excess calories, unspecified classification, unspecified whether serious comorbidity present  -discussed lifestyle modifications - Plan: Hemoglobin A1c, Lipid panel  F/u prn in the next 2 wks   I discussed the assessment and treatment plan with the patient. The patient was provided an opportunity to ask questions and all were answered. The patient agreed with the plan and demonstrated an understanding of the instructions.   The patient was advised to call back or seek an in-person evaluation if the symptoms worsen or if the condition fails to improve as anticipated.  Billie Ruddy, MD

## 2019-02-27 ENCOUNTER — Other Ambulatory Visit: Payer: Self-pay

## 2019-02-27 ENCOUNTER — Other Ambulatory Visit (INDEPENDENT_AMBULATORY_CARE_PROVIDER_SITE_OTHER): Payer: Federal, State, Local not specified - PPO

## 2019-02-27 ENCOUNTER — Telehealth: Payer: Self-pay | Admitting: Family Medicine

## 2019-02-27 DIAGNOSIS — R600 Localized edema: Secondary | ICD-10-CM

## 2019-02-27 DIAGNOSIS — R609 Edema, unspecified: Secondary | ICD-10-CM

## 2019-02-27 DIAGNOSIS — E6609 Other obesity due to excess calories: Secondary | ICD-10-CM | POA: Diagnosis not present

## 2019-02-27 LAB — CBC WITH DIFFERENTIAL/PLATELET
Basophils Absolute: 0.1 10*3/uL (ref 0.0–0.1)
Basophils Relative: 1.1 % (ref 0.0–3.0)
Eosinophils Absolute: 0.3 10*3/uL (ref 0.0–0.7)
Eosinophils Relative: 3.4 % (ref 0.0–5.0)
HCT: 37.9 % (ref 36.0–46.0)
Hemoglobin: 12.5 g/dL (ref 12.0–15.0)
Lymphocytes Relative: 36.2 % (ref 12.0–46.0)
Lymphs Abs: 3.7 10*3/uL (ref 0.7–4.0)
MCHC: 33 g/dL (ref 30.0–36.0)
MCV: 87 fl (ref 78.0–100.0)
Monocytes Absolute: 0.6 10*3/uL (ref 0.1–1.0)
Monocytes Relative: 5.6 % (ref 3.0–12.0)
Neutro Abs: 5.5 10*3/uL (ref 1.4–7.7)
Neutrophils Relative %: 53.7 % (ref 43.0–77.0)
Platelets: 405 10*3/uL — ABNORMAL HIGH (ref 150.0–400.0)
RBC: 4.36 Mil/uL (ref 3.87–5.11)
RDW: 13.9 % (ref 11.5–15.5)
WBC: 10.2 10*3/uL (ref 4.0–10.5)

## 2019-02-27 LAB — LIPID PANEL
Cholesterol: 204 mg/dL — ABNORMAL HIGH (ref 0–200)
HDL: 48 mg/dL (ref >39.00)
LDL Cholesterol: 141 mg/dL — ABNORMAL HIGH (ref 0–99)
NonHDL: 156.33
Total CHOL/HDL Ratio: 4
Triglycerides: 76 mg/dL (ref 0.0–149.0)
VLDL: 15.2 mg/dL (ref 0.0–40.0)

## 2019-02-27 LAB — COMPREHENSIVE METABOLIC PANEL
ALT: 34 U/L (ref 0–35)
AST: 27 U/L (ref 0–37)
Albumin: 4.1 g/dL (ref 3.5–5.2)
Alkaline Phosphatase: 84 U/L (ref 39–117)
BUN: 12 mg/dL (ref 6–23)
CO2: 29 mEq/L (ref 19–32)
Calcium: 9.3 mg/dL (ref 8.4–10.5)
Chloride: 103 mEq/L (ref 96–112)
Creatinine, Ser: 0.84 mg/dL (ref 0.40–1.20)
GFR: 92.04 mL/min (ref >60.00)
Glucose, Bld: 101 mg/dL — ABNORMAL HIGH (ref 70–99)
Potassium: 3.7 mEq/L (ref 3.5–5.1)
Sodium: 139 mEq/L (ref 135–145)
Total Bilirubin: 0.4 mg/dL (ref 0.2–1.2)
Total Protein: 7.3 g/dL (ref 6.0–8.3)

## 2019-02-27 LAB — HEMOGLOBIN A1C: Hgb A1c MFr Bld: 5.9 % (ref 4.6–6.5)

## 2019-02-27 LAB — BRAIN NATRIURETIC PEPTIDE: Pro B Natriuretic peptide (BNP): 9 pg/mL (ref 0.0–100.0)

## 2019-02-27 NOTE — Telephone Encounter (Signed)
Dr. Banks please advise  

## 2019-02-27 NOTE — Telephone Encounter (Signed)
The patient came in today for labs and was thinking that she was supposed to have Blood Pressure cuffs put on her legs while she was her today. Cheyenne [name] and I did not see that in the notes. I suggested that she can do a virtual visit with Dr. Volanda Napoleon and talk with her about the blood pressure cuffs on her legs and she stated that she does not have a blood pressure. Is it possible to send in a Rx to her Pharmacy for a blood pressure cuff?  Please advise

## 2019-02-28 NOTE — Telephone Encounter (Signed)
Discussed placing referral to vascular for ABIs.  Ok with rx for bp cuff, however not sure if insurance will cover costs.

## 2019-03-05 ENCOUNTER — Encounter: Payer: Self-pay | Admitting: Family Medicine

## 2019-03-06 ENCOUNTER — Other Ambulatory Visit: Payer: Self-pay | Admitting: *Deleted

## 2019-03-06 DIAGNOSIS — R609 Edema, unspecified: Secondary | ICD-10-CM

## 2019-03-06 MED ORDER — BLOOD PRESSURE CUFF MISC: 1.0000 [IU] | 0 refills | Status: DC | PRN

## 2019-03-08 ENCOUNTER — Encounter: Payer: Self-pay | Admitting: Family Medicine

## 2019-04-03 NOTE — Telephone Encounter (Signed)
Edema on legs added to pt family history

## 2019-04-04 ENCOUNTER — Encounter: Payer: Self-pay | Admitting: Family Medicine

## 2019-04-11 NOTE — Telephone Encounter (Signed)
LVM for pt with Dr Volanda Napoleon advise

## 2019-05-22 ENCOUNTER — Encounter: Payer: Self-pay | Admitting: Family Medicine

## 2019-05-28 NOTE — Telephone Encounter (Signed)
Please advise if pt needs to set up a visit

## 2019-06-22 NOTE — Telephone Encounter (Signed)
Left a detailed voicemail for pt to return my call in the office and schedule a virtual/in office visit for her issue with Hemorrhoids. I also sent a MyChart message to pt

## 2019-08-06 ENCOUNTER — Encounter: Payer: Self-pay | Admitting: Family Medicine

## 2019-08-06 ENCOUNTER — Ambulatory Visit (INDEPENDENT_AMBULATORY_CARE_PROVIDER_SITE_OTHER): Payer: Federal, State, Local not specified - PPO | Admitting: Family Medicine

## 2019-08-06 ENCOUNTER — Other Ambulatory Visit: Payer: Self-pay

## 2019-08-06 DIAGNOSIS — M722 Plantar fascial fibromatosis: Secondary | ICD-10-CM | POA: Insufficient documentation

## 2019-08-06 DIAGNOSIS — M542 Cervicalgia: Secondary | ICD-10-CM | POA: Diagnosis not present

## 2019-08-06 DIAGNOSIS — F419 Anxiety disorder, unspecified: Secondary | ICD-10-CM | POA: Insufficient documentation

## 2019-08-06 MED ORDER — TIZANIDINE HCL 2 MG PO TABS: 2.0000 mg | ORAL_TABLET | Freq: Four times a day (QID) | ORAL | 1 refills | Status: DC | PRN

## 2019-08-06 MED ORDER — NABUMETONE 750 MG PO TABS: 750.0000 mg | ORAL_TABLET | Freq: Two times a day (BID) | ORAL | 6 refills | Status: DC | PRN

## 2019-08-06 NOTE — Progress Notes (Signed)
   Office Visit Note   Patient: Cheyenne [name]           Date of Birth: 08/02/1981           MRN: [id number] Visit Date: 08/06/2019 Requested by: Billie Ruddy, MD [place],   [id number] PCP: Billie Ruddy, MD  Subjective: Chief Complaint  Patient presents with  . Neck - Pain    Pain started in the posterior neck 2 months ago. NKI, but types all day. No longer hurts now (x approximately 1 month).   . Right Upper Arm - Pain    Pain in the upper arm started  1 month ago (after the neck stopped hurting). Tingling pain is constant x 2 weeks. Pain in the right hand with writing/holding a fork. No weakness/no dropping objects. Right-hand dominant.    HPI: She is here with neck and right arm pain.  Symptoms started about 2 months ago in the posterior neck, and then 1 month ago from the trapezius down into the arm.  She gets a tingling pain, no weakness.  She has tried ibuprofen with minimal relief.  She is right-hand dominant.               ROS: No fever or chills.  All other systems were reviewed and are negative.  Objective: Vital Signs: There were no vitals taken for this visit.  Physical Exam:  General:  Alert and oriented, in no acute distress. Pulm:  Breathing unlabored. Psy:  Normal mood, congruent affect. Skin: No rash on the skin. Neck: She has good range of motion but some pain with looking upward.  Spurling's test negative.  Deltoid, rotator cuff, biceps, triceps, wrist and intrinsic hand strength are normal.  She has positive Tinel's at the carpal tunnel but negative Phalen's test.  Imaging: None today.  Assessment & Plan: 1.  Neck and right arm pain with radicular symptoms, nonfocal neurologic exam. -Trial of Relafen and Zanaflex as needed.  She is not interested in physical therapy right now but that would be an option if she fails to improve.  X-rays and MRI scan if she fails conservative management.     Procedures: No procedures  performed  No notes on file     PMFS History: Patient Active Problem List   Diagnosis Date Noted  . Anxiety 08/06/2019  . Plantar fasciitis 08/06/2019  . Fluid collection (edema) in the arms, legs, hands and feet 05/02/2018   Past Medical History:  Diagnosis Date  . Anxiety     Family History  Problem Relation Age of Onset  . Hypertension Mother   . Edema Mother     History reviewed. No pertinent surgical history. Social History   Occupational History  . Occupation: Claims Rep  Tobacco Use  . Smoking status: Never Smoker  . Smokeless tobacco: Never Used  Substance and Sexual Activity  . Alcohol use: Yes    Alcohol/week: 1.0 standard drinks    Types: 1 Standard drinks or equivalent per week    Comment: social drinker, 1 a month  . Drug use: No  . Sexual activity: Yes

## 2019-08-26 ENCOUNTER — Encounter: Payer: Self-pay | Admitting: Family Medicine

## 2019-08-27 MED ORDER — DICLOFENAC SODIUM 75 MG PO TBEC: 75.0000 mg | DELAYED_RELEASE_TABLET | Freq: Two times a day (BID) | ORAL | 3 refills | Status: DC | PRN

## 2019-08-28 MED ORDER — PREDNISONE 10 MG PO TABS: ORAL_TABLET | ORAL | 0 refills | Status: DC

## 2019-08-28 NOTE — Addendum Note (Signed)
Addended by: Hortencia Pilar on: 08/28/2019 07:58 AM   Modules accepted: Orders

## 2019-09-07 ENCOUNTER — Encounter: Payer: Self-pay | Admitting: Family Medicine

## 2019-09-07 DIAGNOSIS — M542 Cervicalgia: Secondary | ICD-10-CM

## 2019-10-04 ENCOUNTER — Other Ambulatory Visit: Payer: Self-pay

## 2019-10-04 ENCOUNTER — Encounter: Payer: Self-pay | Admitting: Physical Therapy

## 2019-10-04 ENCOUNTER — Ambulatory Visit (INDEPENDENT_AMBULATORY_CARE_PROVIDER_SITE_OTHER): Payer: Federal, State, Local not specified - PPO | Admitting: Physical Therapy

## 2019-10-04 DIAGNOSIS — M542 Cervicalgia: Secondary | ICD-10-CM | POA: Diagnosis not present

## 2019-10-04 DIAGNOSIS — Z6841 Body Mass Index (BMI) 40.0 and over, adult: Secondary | ICD-10-CM | POA: Diagnosis not present

## 2019-10-04 DIAGNOSIS — Z1151 Encounter for screening for human papillomavirus (HPV): Secondary | ICD-10-CM | POA: Diagnosis not present

## 2019-10-04 DIAGNOSIS — M6281 Muscle weakness (generalized): Secondary | ICD-10-CM

## 2019-10-04 DIAGNOSIS — L709 Acne, unspecified: Secondary | ICD-10-CM | POA: Diagnosis not present

## 2019-10-04 DIAGNOSIS — Z01419 Encounter for gynecological examination (general) (routine) without abnormal findings: Secondary | ICD-10-CM | POA: Diagnosis not present

## 2019-10-04 DIAGNOSIS — M79601 Pain in right arm: Secondary | ICD-10-CM | POA: Diagnosis not present

## 2019-10-04 NOTE — Therapy (Signed)
[facility] Physical Therapy [address] [place], [place], [address] Phone: [phone]   Fax:  [phone]  Physical Therapy Evaluation  Patient Details  Name: Cheyenne [name] MRN: [id number] Date of Birth: 1980-08-11 Referring Provider (PT): Eunice Blase, MD   Encounter Date: 10/04/2019  PT End of Session - 10/04/19 1500    Visit Number  1    Number of Visits  12    Date for PT Re-Evaluation  11/15/19    PT Start Time  [id number]    PT Stop Time  1450    PT Time Calculation (min)  45 min    Activity Tolerance  Patient tolerated treatment well    Behavior During Therapy  [facility] for tasks assessed/performed       Past Medical History:  Diagnosis Date  . Anxiety     History reviewed. No pertinent surgical history.  There were no vitals filed for this visit.   Subjective Assessment - 10/04/19 1407    Subjective  Pain started in the posterior neck 2 months ago. NKI, but types all day.She gets a tingling pain, with some weakness in her Rt hand with opening jars.   She has tried meds with minimal relief.  She is right-hand dominant. She is unsure if the neck pain is connected to the hand as sometimes the pain runs down her arm and sometimes she only feels it in the hand. She does not know any certain movements that provoke her hand numbness but does report if it is cold or she lays on that side it happens more    Diagnostic tests  no recent imaging    Patient Stated Goals  get rid of the pain    Currently in Pain?  Yes    Pain Score  2     Pain Location  Neck    Pain Orientation  Posterior   around C7-T1   Pain Descriptors / Indicators  Aching;Tingling    Pain Type  Acute pain    Pain Radiating Towards  into her left hand    Pain Onset  More than a month ago    Pain Frequency  Intermittent    Pain Relieving Factors  nothing         [facility] PT Assessment - 10/04/19 0001      Assessment   Medical Diagnosis  cervicalgia with Left radiculopathy    Referring Provider (PT)  Hilts, Michael, MD    Onset Date/Surgical Date  --   2 month onset of pain   Hand Dominance  Right    Next MD Visit  nothing scheduled    Prior Therapy  none recent      Precautions   Precautions  None      Restrictions   Weight Bearing Restrictions  No      Balance Screen   Has the patient fallen in the past 6 months  No      [place] residence      Prior Function   Level of Independence  Independent    Vocation  Full time employment    Vocation Requirements  typeing    Leisure  yard yardwork      Cognition   Overall Cognitive Status  Within Functional Limits for tasks assessed      Sensation   Light Touch  Appears Intact   equal on both sides with dermatomes     Coordination   Gross Motor Movements are Fluid  and Coordinated  Yes      Posture/Postural Control   Posture Comments  rounded shoulders, Thoracic kyphosis      ROM / Strength   AROM / PROM / Strength  AROM;Strength      AROM   Overall AROM Comments  shoulder ROM WFL    AROM Assessment Site  Cervical    Cervical Flexion  75%    Cervical Extension  WNL    Cervical - Right Side Bend  50%    Cervical - Left Side Bend  50%    Cervical - Right Rotation  50%    Cervical - Left Rotation  50%      Strength   Overall Strength Comments  UE strength 5/5 bilat except for grip strength limited to 38.5 lbs on Rt compared to 55 lbs on Lt and she is Rt hand dominant      Palpation   Palpation comment  TTP C7-T2 and this caused Rt UE radicular symptoms      Special Tests   Other special tests  + spurlings test, radiculopathy with cervical PAM testing C7-T2, radiculopathy with manual traction, negative Upper limb tension testing      Transfers   Transfers  Independent with all Transfers                Objective measurements completed on examination: See above findings.      [facility] Adult PT Treatment/Exercise - 10/04/19 0001       Modalities   Modalities  Moist Heat      Moist Heat Therapy   Number Minutes Moist Heat  10 Minutes    Moist Heat Location  Cervical;Shoulder   with education            PT Education - 10/04/19 1500    Education Details  HEP, POC, exam findings    Person(s) Educated  Patient    Methods  Explanation;Demonstration;Verbal cues;Handout    Comprehension  Verbalized understanding;Need further instruction          PT Long Term Goals - 10/04/19 1509      PT LONG TERM GOAL #1   Title  Pt will be I and compliant with HEP. (target for all goals 6 weeks 11/15/19)    Time  6    Period  Weeks    Status  New      PT LONG TERM GOAL #2   Title  Pt will improve cervical ROM to WNL    Status  New      PT LONG TERM GOAL #3   Title  Pt will no longer report radicular symptoms in her Rt hand more than 10-20% of the time    Status  New      PT LONG TERM GOAL #4   Title  Pt will improve Rt grip strength to at least 50 lbs.    Status  New             Plan - 10/04/19 1501    Clinical Impression Statement  Pt presents with subacute cervicagia with Rt UE radiculopathy likely due to bulging disc. She has overall decreased grip strength in her Rt hand, decreased neck ROM, and increased pain in her neck and down her Rt arm in radial nerve distribution. She will benefit from skilled PT to address her deficits. If she does not improve in 2- 3 weeks will refer back to MD for further evaluation/diagnostic testing    Examination-Activity Limitations  Lift;Sleep;Carry;Reach Overhead  Examination-Participation Restrictions  Cleaning;Driving;Yard Work    [employer] officer  Evolving/Moderate complexity    Clinical Decision Making  Moderate    Rehab Potential  Good    PT Frequency  2x / week    PT Duration  6 weeks    PT Treatment/Interventions  Cryotherapy;Electrical Stimulation;Iontophoresis 4mg /ml Dexamethasone;Moist Heat;Traction;Ultrasound;Therapeutic  activities;Therapeutic exercise;Neuromuscular re-education;Manual techniques;Passive range of motion;Dry needling;Joint Manipulations    PT Next Visit Plan  review and update HEP PRN    PT Home Exercise Plan  Access Code: [email or web address]    Consulted and Agree with Plan of Care  Patient       Patient will benefit from skilled therapeutic intervention in order to improve the following deficits and impairments:  Decreased activity tolerance, Decreased mobility, Decreased range of motion, Decreased strength, Increased muscle spasms, Pain, Postural dysfunction  Visit Diagnosis: Cervicalgia  Pain in right arm  Muscle weakness (generalized)     Problem List Patient Active Problem List   Diagnosis Date Noted  . Anxiety 08/06/2019  . Plantar fasciitis 08/06/2019  . Fluid collection (edema) in the arms, legs, hands and feet 05/02/2018    Debbe Odea, PT,DPT 10/04/2019, 3:11 PM  [facility] Physical Therapy [address] [place], [place], [address] Phone: [phone]   Fax:  [phone]  Name: Cheyenne [name] MRN: [id number] Date of Birth: April 27, 1981

## 2019-10-04 NOTE — Addendum Note (Signed)
Addended by: Debbe Odea on: 10/04/2019 03:12 PM   Modules accepted: Orders

## 2019-10-04 NOTE — Patient Instructions (Signed)
Access Code: [email or web address]  URL: [email or web address]  Date: 10/04/2019  Prepared by: Elsie Ra   Exercises  Seated Levator Scapulae Stretch - 3 sets - 30 hold - 2x daily - 6x weekly  Seated Cervical Sidebending Stretch - 3 sets - 30 hold - 2x daily - 6x weekly  Seated Cervical Retraction - 10 reps - 3 sets - 2x daily - 6x weekly  Seated Thoracic Extension with Hands Behind Neck - 10 reps - 1-2 sets - 5 sec hold - 2x daily - 6x weekly  Doorway Pec Stretch at 90 Degrees Abduction - 3 sets - 30 hold - 2x daily - 6x weekly  Standing Shoulder Row with Anchored Resistance - 10 reps - 2-3 sets - 2x daily - 6x weekly

## 2019-10-16 ENCOUNTER — Other Ambulatory Visit: Payer: Self-pay

## 2019-10-16 ENCOUNTER — Encounter: Payer: Self-pay | Admitting: Physical Therapy

## 2019-10-16 ENCOUNTER — Ambulatory Visit: Payer: Federal, State, Local not specified - PPO | Admitting: Physical Therapy

## 2019-10-16 DIAGNOSIS — M6281 Muscle weakness (generalized): Secondary | ICD-10-CM | POA: Diagnosis not present

## 2019-10-16 DIAGNOSIS — M542 Cervicalgia: Secondary | ICD-10-CM

## 2019-10-16 DIAGNOSIS — M79601 Pain in right arm: Secondary | ICD-10-CM

## 2019-10-16 NOTE — Therapy (Signed)
[facility] Physical Therapy [address] [place], [place], [address] Phone: [phone]   Fax:  [phone]  Physical Therapy Treatment  Patient Details  Name: Cheyenne [name] MRN: [id number] Date of Birth: March 31, 1981 Referring Provider (PT): Eunice Blase, MD   Encounter Date: 10/16/2019  PT End of Session - 10/16/19 1305    Visit Number  2    Number of Visits  12    Date for PT Re-Evaluation  11/15/19    PT Start Time  0930    PT Stop Time  1010    PT Time Calculation (min)  40 min    Activity Tolerance  Patient tolerated treatment well    Behavior During Therapy  [facility] for tasks assessed/performed       Past Medical History:  Diagnosis Date  . Anxiety     History reviewed. No pertinent surgical history.  There were no vitals filed for this visit.  Subjective Assessment - 10/16/19 0932    Subjective  feels 2 exercises increase numbness in Rt hand - expressed upper trap and levator stretch    Diagnostic tests  no recent imaging    Patient Stated Goals  get rid of the pain    Currently in Pain?  No/denies   just tingling this morning - none currently   Pain Onset  More than a month ago                       [facility] Adult PT Treatment/Exercise - 10/16/19 0934      Exercises   Exercises  Neck      Neck Exercises: Machines for Strengthening   UBE (Upper Arm Bike)  L2.5 x 5 min (2.5' each direction)      Neck Exercises: Seated   Neck Retraction  10 reps;10 secs    Other Seated Exercise  thoracic extensioin 10 x 5 sec hold      Manual Therapy   Manual Therapy  Joint mobilization;Soft tissue mobilization    Manual therapy comments  skilled palpation and monitoring of soft tissue during DN    Joint Mobilization  CPA grades 1-2 C5-7    Soft tissue mobilization  bil cervical paraspinals into upper traps      Neck Exercises: Stretches   Other Neck Stretches  mid doorway stretch 3x30 sec       Trigger Point Dry Needling -  10/16/19 1304    Consent Given?  Yes    Education Handout Provided  Yes    Muscles Treated Head and Neck  Splenius capitus;Semispinalis capitus;Cervical multifidi    Splenius capitus Response  Twitch reponse elicited;Palpable increased muscle length    Semispinalis capitus Response  Twitch reponse elicited;Palpable increased muscle length    Cervical multifidi Response  Twitch reponse elicited;Palpable increased muscle length                PT Long Term Goals - 10/04/19 1509      PT LONG TERM GOAL #1   Title  Pt will be I and compliant with HEP. (target for all goals 6 weeks 11/15/19)    Time  6    Period  Weeks    Status  New      PT LONG TERM GOAL #2   Title  Pt will improve cervical ROM to WNL    Status  New      PT LONG TERM GOAL #3   Title  Pt will no longer report radicular symptoms in  her Rt hand more than 10-20% of the time    Status  New      PT LONG TERM GOAL #4   Title  Pt will improve Rt grip strength to at least 50 lbs.    Status  New            Plan - 10/16/19 1305    Clinical Impression Statement  Pt with decreased symptoms following DN and manual therapy today.  Advised to hold off on exercises that increase numbness for now, and will monitor for when we can work these back into HEP.  No goals met as only 2nd visit.    Examination-Activity Limitations  Lift;Sleep;Carry;Reach Overhead    Examination-Participation Restrictions  Cleaning;Driving;Yard Work    Stability/Clinical Decision Making  Evolving/Moderate complexity    Rehab Potential  Good    PT Frequency  2x / week    PT Duration  6 weeks    PT Treatment/Interventions  Cryotherapy;Electrical Stimulation;Iontophoresis 66m/ml Dexamethasone;Moist Heat;Traction;Ultrasound;Therapeutic activities;Therapeutic exercise;Neuromuscular re-education;Manual techniques;Passive range of motion;Dry needling;Joint Manipulations    PT Next Visit Plan  assess response to DN, manual/modalities/DN PRN, consider  traction?    PT Home Exercise Plan  Access Code: [email or web address]    Consulted and Agree with Plan of Care  Patient       Patient will benefit from skilled therapeutic intervention in order to improve the following deficits and impairments:  Decreased activity tolerance, Decreased mobility, Decreased range of motion, Decreased strength, Increased muscle spasms, Pain, Postural dysfunction  Visit Diagnosis: Cervicalgia  Pain in right arm  Muscle weakness (generalized)     Problem List Patient Active Problem List   Diagnosis Date Noted  . Anxiety 08/06/2019  . Plantar fasciitis 08/06/2019  . Fluid collection (edema) in the arms, legs, hands and feet 05/02/2018      SLaureen Abrahams PT, DPT 10/16/19 1:09 PM     [facility] [address] [place] [address]Phone: [phone]  Fax:  [phone] Name: Cheyenne BeaudinMRN: [id number] of Birth: 103/04/1981

## 2019-10-16 NOTE — Patient Instructions (Signed)

## 2019-10-19 ENCOUNTER — Encounter: Payer: Self-pay | Admitting: Rehabilitative and Restorative Service Providers"

## 2019-10-19 ENCOUNTER — Other Ambulatory Visit: Payer: Self-pay

## 2019-10-19 ENCOUNTER — Ambulatory Visit: Payer: Federal, State, Local not specified - PPO | Admitting: Rehabilitative and Restorative Service Providers"

## 2019-10-19 DIAGNOSIS — M542 Cervicalgia: Secondary | ICD-10-CM | POA: Diagnosis not present

## 2019-10-19 DIAGNOSIS — M6281 Muscle weakness (generalized): Secondary | ICD-10-CM | POA: Diagnosis not present

## 2019-10-19 DIAGNOSIS — M79601 Pain in right arm: Secondary | ICD-10-CM

## 2019-10-19 NOTE — Therapy (Signed)
[facility] Physical Therapy [address] [place], [place], [address] Phone: [phone]   Fax:  [phone]  Physical Therapy Treatment  Patient Details  Name: Cheyenne [name] MRN: [id number] Date of Birth: [birth year] Referring Provider (PT): Eunice Blase, MD   Encounter Date: 10/19/2019  PT End of Session - 10/19/19 1520    Visit Number  3    Number of Visits  12    Date for PT Re-Evaluation  11/15/19    PT Start Time  [id number]    PT Stop Time  1528    PT Time Calculation (min)  41 min    Activity Tolerance  Patient tolerated treatment well    Behavior During Therapy  [facility] for tasks assessed/performed       Past Medical History:  Diagnosis Date  . Anxiety     History reviewed. No pertinent surgical history.  There were no vitals filed for this visit.  Subjective Assessment - 10/19/19 1458    Subjective  Pt. indicated feeling progress in symptoms since last visit.  Some mild complaints upon arrival indicated in tingling.    Diagnostic tests  no recent imaging    Patient Stated Goals  get rid of the pain    Currently in Pain?  Yes    Pain Score  4     Pain Location  Arm    Pain Orientation  Right    Pain Descriptors / Indicators  Tingling    Pain Onset  More than a month ago                       [facility] Adult PT Treatment/Exercise - 10/19/19 0001      Neck Exercises: Machines for Strengthening   UBE (Upper Arm Bike)  Lvl 1.5 3 mins each way, then 2 mins each way after manual      Neck Exercises: Standing   Other Standing Exercises  Tband rows, gh ext green 3 x 10 each      Neck Exercises: Seated   Other Seated Exercise  cross arm stretch 15 sec x 5 Rt UE      Moist Heat Therapy   Number Minutes Moist Heat  5 Minutes   c exercise   Moist Heat Location  Shoulder      Manual Therapy   Soft tissue mobilization  Active compression to Rt infraspintus       Trigger Point Dry Needling - 10/19/19 0001    Consent Given?  Yes     Education Handout Provided  Previously provided    Muscles Treated Upper Quadrant  Infraspinatus   Rt   Infraspinatus Response  Twitch response elicited           PT Education - 10/19/19 1459    Education Details  Cues for intervention at times.    Person(s) Educated  Patient    Methods  Explanation;Demonstration    Comprehension  Verbalized understanding          PT Long Term Goals - 10/04/19 1509      PT LONG TERM GOAL #1   Title  Pt will be I and compliant with HEP. (target for all goals 6 weeks 11/15/19)    Time  6    Period  Weeks    Status  New      PT LONG TERM GOAL #2   Title  Pt will improve cervical ROM to WNL    Status  New  PT LONG TERM GOAL #3   Title  Pt will no longer report radicular symptoms in her Rt hand more than 10-20% of the time    Status  New      PT LONG TERM GOAL #4   Title  Pt will improve Rt grip strength to at least 50 lbs.    Status  New            Plan - 10/19/19 1519    Clinical Impression Statement  Indications of concordant symptoms from Rt infraspintus to Rt UE at this time.  Pt. reported mild reduction in symptoms post manual intervention today.  Continued skilled PT services advised.    Examination-Activity Limitations  Lift;Sleep;Carry;Reach Overhead    Examination-Participation Restrictions  Cleaning;Driving;Yard Work    Stability/Clinical Decision Making  Evolving/Moderate complexity    Rehab Potential  Good    PT Frequency  2x / week    PT Duration  6 weeks    PT Treatment/Interventions  Cryotherapy;Electrical Stimulation;Iontophoresis 4mg /ml Dexamethasone;Moist Heat;Traction;Ultrasound;Therapeutic activities;Therapeutic exercise;Neuromuscular re-education;Manual techniques;Passive range of motion;Dry needling;Joint Manipulations    PT Next Visit Plan  Reassess and utlize DN to affected areas, continue to improve postural control.    PT Home Exercise Plan  Access Code: [email or web address]    Consulted and Agree with Plan  of Care  Patient       Patient will benefit from skilled therapeutic intervention in order to improve the following deficits and impairments:  Decreased activity tolerance, Decreased mobility, Decreased range of motion, Decreased strength, Increased muscle spasms, Pain, Postural dysfunction  Visit Diagnosis: Cervicalgia  Pain in right arm  Muscle weakness (generalized)     Problem List Patient Active Problem List   Diagnosis Date Noted  . Anxiety 08/06/2019  . Plantar fasciitis 08/06/2019  . Fluid collection (edema) in the arms, legs, hands and feet 05/02/2018   Scot Jun, PT, DPT, OCS, ATC 10/19/19  3:23 PM    [facility] [address] [place], [place], [address] Phone: [phone]   Fax:  [phone]  Name: Cheyenne [name] MRN: [id number] Date of Birth: 21-May-1981

## 2019-10-22 ENCOUNTER — Encounter: Payer: Federal, State, Local not specified - PPO | Admitting: Physical Therapy

## 2019-10-24 ENCOUNTER — Ambulatory Visit: Payer: Federal, State, Local not specified - PPO | Admitting: Rehabilitative and Restorative Service Providers"

## 2019-10-24 ENCOUNTER — Other Ambulatory Visit: Payer: Self-pay

## 2019-10-24 DIAGNOSIS — M542 Cervicalgia: Secondary | ICD-10-CM

## 2019-10-24 DIAGNOSIS — M79601 Pain in right arm: Secondary | ICD-10-CM | POA: Diagnosis not present

## 2019-10-24 DIAGNOSIS — M6281 Muscle weakness (generalized): Secondary | ICD-10-CM

## 2019-10-24 NOTE — Therapy (Addendum)
[facility] Physical Therapy [address] [place], [place], [address] Phone: [phone]   Fax:  [phone]  Physical Therapy Treatment/Discharge  Patient Details  Name: Cheyenne [name] MRN: [id number] Date of Birth: 06-Dec-1980 Referring Provider (PT): Eunice Blase, MD   Encounter Date: 10/24/2019  PT End of Session - 10/24/19 1448    Visit Number  4    Number of Visits  12    Date for PT Re-Evaluation  11/15/19    PT Start Time  2423    PT Stop Time  1524    PT Time Calculation (min)  39 min    Activity Tolerance  Patient tolerated treatment well    Behavior During Therapy  [facility] for tasks assessed/performed       Past Medical History:  Diagnosis Date  . Anxiety     No past surgical history on file.  There were no vitals filed for this visit.                    [facility] Adult PT Treatment/Exercise - 10/24/19 0001      Neck Exercises: Machines for Strengthening   UBE (Upper Arm Bike)  lvl 1.5 5 mins fwd/back each      Neck Exercises: Standing   Other Standing Exercises  Tband rows, gh ext green 3 x 10 each    Other Standing Exercises  UE ER in neutral c green band 3 x 10 bilateral      Neck Exercises: Seated   Other Seated Exercise  cross arm stretch 15 sec x 5 Rt UE      Moist Heat Therapy   Number Minutes Moist Heat  5 Minutes   c stretches   Moist Heat Location  Shoulder      Manual Therapy   Soft tissue mobilization  Active compression to Rt infraspintus       Trigger Point Dry Needling - 10/24/19 0001    Consent Given?  Yes    Education Handout Provided  Previously provided    Muscles Treated Upper Quadrant  Infraspinatus    Infraspinatus Response  Twitch response elicited                PT Long Term Goals - 10/04/19 1509      PT LONG TERM GOAL #1   Title  Pt will be I and compliant with HEP. (target for all goals 6 weeks 11/15/19)    Time  6    Period  Weeks    Status  New      PT LONG TERM GOAL  #2   Title  Pt will improve cervical ROM to WNL    Status  New      PT LONG TERM GOAL #3   Title  Pt will no longer report radicular symptoms in her Rt hand more than 10-20% of the time    Status  New      PT LONG TERM GOAL #4   Title  Pt will improve Rt grip strength to at least 50 lbs.    Status  New              Patient will benefit from skilled therapeutic intervention in order to improve the following deficits and impairments:     Visit Diagnosis: Cervicalgia  Pain in right arm  Muscle weakness (generalized)     Problem List Patient Active Problem List   Diagnosis Date Noted  . Anxiety 08/06/2019  . Plantar fasciitis 08/06/2019  .  Fluid collection (edema) in the arms, legs, hands and feet 05/02/2018   Scot Jun, PT, DPT, OCS, ATC 10/24/19  3:16 PM   PHYSICAL THERAPY DISCHARGE SUMMARY  Visits from Start of Care: 4  Current functional level related to goals / functional outcomes: See note   Remaining deficits: See note   Education / Equipment: hep Plan: Patient agrees to discharge.  Patient goals were partially met. Patient is being discharged due to not returning since the last visit.  ?????      Scot Jun, PT, DPT, OCS, ATC 12/27/19  9:20 AM      [facility] Physical Therapy [address] [place], [place], [address] Phone: [phone]   Fax:  [phone]  Name: Cheyenne [name] MRN: [id number] Date of Birth: 1980/10/17

## 2019-10-29 ENCOUNTER — Encounter: Payer: Federal, State, Local not specified - PPO | Admitting: Physical Therapy

## 2019-10-31 ENCOUNTER — Encounter: Payer: Federal, State, Local not specified - PPO | Admitting: Physical Therapy

## 2019-11-05 ENCOUNTER — Encounter: Payer: Federal, State, Local not specified - PPO | Admitting: Rehabilitative and Restorative Service Providers"

## 2019-11-07 ENCOUNTER — Encounter: Payer: Federal, State, Local not specified - PPO | Admitting: Rehabilitative and Restorative Service Providers"

## 2019-11-12 ENCOUNTER — Encounter: Payer: Federal, State, Local not specified - PPO | Admitting: Rehabilitative and Restorative Service Providers"

## 2019-11-14 ENCOUNTER — Encounter: Payer: Federal, State, Local not specified - PPO | Admitting: Rehabilitative and Restorative Service Providers"

## 2020-01-24 ENCOUNTER — Telehealth: Payer: Self-pay | Admitting: Family Medicine

## 2020-01-24 NOTE — Telephone Encounter (Signed)
Pharmacy called and is needing a refill on this medication:  cetirizine (ZYRTEC) 10 MG tablet [[id number]]  Please advise.

## 2020-01-24 NOTE — Telephone Encounter (Signed)
Pharmacy has been contacted on the denied request and a message has been sent to the pt to schedule an appt for more refills.

## 2020-02-17 ENCOUNTER — Encounter: Payer: Self-pay | Admitting: Family Medicine

## 2020-02-17 DIAGNOSIS — M542 Cervicalgia: Secondary | ICD-10-CM

## 2020-02-18 NOTE — Addendum Note (Signed)
Addended by: Hortencia Pilar on: 02/18/2020 12:54 PM   Modules accepted: Orders

## 2020-03-21 ENCOUNTER — Telehealth: Payer: Self-pay | Admitting: Family Medicine

## 2020-03-21 ENCOUNTER — Ambulatory Visit
Admission: RE | Admit: 2020-03-21 | Discharge: 2020-03-21 | Disposition: A | Discharge status: Home / Self Care | Payer: Federal, State, Local not specified - PPO | Source: Ambulatory Visit | Attending: Family Medicine | Admitting: Family Medicine

## 2020-03-21 DIAGNOSIS — M542 Cervicalgia: Secondary | ICD-10-CM

## 2020-03-21 DIAGNOSIS — M4802 Spinal stenosis, cervical region: Secondary | ICD-10-CM | POA: Diagnosis not present

## 2020-03-21 DIAGNOSIS — M5020 Other cervical disc displacement, unspecified cervical region: Secondary | ICD-10-CM

## 2020-03-21 NOTE — Telephone Encounter (Signed)
MRI shows a right-sided disc herniation at C6-7 which is compressing the right C7 nerve root.

## 2020-03-21 NOTE — Addendum Note (Signed)
Addended by: Hortencia Pilar on: 03/21/2020 03:05 PM   Modules accepted: Orders

## 2020-03-25 ENCOUNTER — Other Ambulatory Visit: Payer: Self-pay | Admitting: Physical Medicine and Rehabilitation

## 2020-03-25 DIAGNOSIS — F411 Generalized anxiety disorder: Secondary | ICD-10-CM

## 2020-03-25 MED ORDER — DIAZEPAM 5 MG PO TABS
ORAL_TABLET | ORAL | 0 refills | Status: DC
Start: 2020-03-25 — End: 2020-04-23

## 2020-03-25 NOTE — Progress Notes (Signed)
Pre-procedure diazepam ordered for pre-operative anxiety.  

## 2020-04-01 ENCOUNTER — Other Ambulatory Visit: Payer: Self-pay

## 2020-04-02 ENCOUNTER — Encounter: Payer: Self-pay | Admitting: Gastroenterology

## 2020-04-02 ENCOUNTER — Encounter: Payer: Self-pay | Admitting: Family Medicine

## 2020-04-02 ENCOUNTER — Ambulatory Visit (INDEPENDENT_AMBULATORY_CARE_PROVIDER_SITE_OTHER): Payer: Federal, State, Local not specified - PPO | Admitting: Family Medicine

## 2020-04-02 VITALS — BP 116/78 | HR 80 | Temp 98.1°F | Ht 61.0 in | Wt 188.8 lb

## 2020-04-02 DIAGNOSIS — Z0000 Encounter for general adult medical examination without abnormal findings: Secondary | ICD-10-CM | POA: Diagnosis not present

## 2020-04-02 DIAGNOSIS — M5412 Radiculopathy, cervical region: Secondary | ICD-10-CM

## 2020-04-02 DIAGNOSIS — E782 Mixed hyperlipidemia: Secondary | ICD-10-CM

## 2020-04-02 DIAGNOSIS — R600 Localized edema: Secondary | ICD-10-CM | POA: Diagnosis not present

## 2020-04-02 DIAGNOSIS — K649 Unspecified hemorrhoids: Secondary | ICD-10-CM

## 2020-04-02 DIAGNOSIS — L910 Hypertrophic scar: Secondary | ICD-10-CM

## 2020-04-02 DIAGNOSIS — R7303 Prediabetes: Secondary | ICD-10-CM

## 2020-04-02 DIAGNOSIS — F419 Anxiety disorder, unspecified: Secondary | ICD-10-CM | POA: Diagnosis not present

## 2020-04-02 DIAGNOSIS — L819 Disorder of pigmentation, unspecified: Secondary | ICD-10-CM | POA: Diagnosis not present

## 2020-04-02 MED ORDER — HYDROCORTISONE (PERIANAL) 2.5 % EX CREA: 1.0000 "application " | TOPICAL_CREAM | Freq: Two times a day (BID) | CUTANEOUS | 0 refills | Status: DC

## 2020-04-02 MED ORDER — HYDROQUINONE 4 % EX CREA: TOPICAL_CREAM | CUTANEOUS | 0 refills | Status: DC

## 2020-04-02 NOTE — Progress Notes (Signed)
Subjective:     Cheyenne [name] is a 39 y.o. female and is here for a comprehensive physical exam. The patient reports problems - Skin changes, hemorrhoids, anxiety, RUE numbness/tingling. Patient notes mild discoloration of b/l ankles. Patient also with dark marks on lower extremities 2/2 picking at sores and bumps. Patient states in the past she was given a cream to start with an H and would like a refill to help fade the spots. Patient also notes a bump on right upper thigh that has been there for a while and will not go away. Patient seen by Dr. Derry Lory and Dr. Ernestina Patches, Ortho for cervical radiculopathy. Patient states she was told she has a bulging disc at C6-7 which is causing RUE numbness/tingling. Patient has steroid injection tomorrow. Was advised she may need surgery. Patient inquires about second opinion.  Patient mentions history of hemorrhoids with bleeding. Blood on tissue and in stools. Patient tried OTC Preparation H and wipes. Denies constipation. Drinking 6-8 bottles of water per day. Walking for exercise per wife. In the past took MiraLAX.  Patient notes increased anxiety. Was given a prescription for Xanax by previous providers which she takes infrequently. Patient notes increased anxiety while at work. Currently working from home which helps. Patient tried counseling in the past but did not feel like she benefited from it. Patient denies issues with sleep or energy.  Patient endorses Pap 09/2019. Patient worried about hemoglobin A1c. Previously noted is prediabetic. Endorses increased sweets and carbohydrate intake.  Social History   Socioeconomic History  . Marital status: Married    Spouse name: Not on file  . Number of children: Not on file  . Years of education: Not on file  . Highest education level: Not on file  Occupational History  . Occupation: Claims Rep  Tobacco Use  . Smoking status: Never Smoker  . Smokeless tobacco: Never Used  Substance and Sexual Activity  .  Alcohol use: Yes    Alcohol/week: 1.0 standard drink    Types: 1 Standard drinks or equivalent per week    Comment: social drinker, 1 a month  . Drug use: No  . Sexual activity: Yes  Other Topics Concern  . Not on file  Social History Narrative   Married. Education: [employer]. Exercise: No.   Social Determinants of Health   Financial Resource Strain:   . Difficulty of Paying Living Expenses: Not on file  Food Insecurity:   . Worried About Charity fundraiser in the Last Year: Not on file  . Ran Out of Food in the Last Year: Not on file  Transportation Needs:   . Lack of Transportation (Medical): Not on file  . Lack of Transportation (Non-Medical): Not on file  Physical Activity:   . Days of Exercise per Week: Not on file  . Minutes of Exercise per Session: Not on file  Stress:   . Feeling of Stress : Not on file  Social Connections:   . Frequency of Communication with Friends and Family: Not on file  . Frequency of Social Gatherings with Friends and Family: Not on file  . Attends Religious Services: Not on file  . Active Member of Clubs or Organizations: Not on file  . Attends Archivist Meetings: Not on file  . Marital Status: Not on file  Intimate Partner Violence:   . Fear of Current or Ex-Partner: Not on file  . Emotionally Abused: Not on file  . Physically Abused: Not on file  . Sexually  Abused: Not on file   Health Maintenance  Topic Date Due  . Hepatitis C Screening  Never done  . COVID-19 Vaccine (1) Never done  . HIV Screening  Never done  . INFLUENZA VACCINE  03/02/2020  . PAP SMEAR-Modifier  09/27/2022  . TETANUS/TDAP  12/23/2024    The following portions of the patient's history were reviewed and updated as appropriate: allergies, current medications, past family history, past medical history, past social history, past surgical history and problem list.  Review of Systems Pertinent items noted in HPI and remainder of comprehensive ROS otherwise  negative.   Objective:    BP 116/78 (BP Location: Left Arm, Patient Position: Sitting, Cuff Size: Large)   Pulse 80   Temp 98.1 F (36.7 C) (Oral)   Ht _0  (1.549 m)   Wt 188 lb 12.8 oz (85.6 kg)   LMP 11/29/2019 (Approximate)   SpO2 97%   BMI 35.67 kg/m  General appearance: alert, cooperative and no distress Head: Normocephalic, without obvious abnormality, atraumatic Eyes: conjunctivae/corneas clear. PERRL, EOM's intact. Fundi benign. Ears: normal TM's and external ear canals both ears Nose: Nares normal. Septum midline. Mucosa normal. No drainage or sinus tenderness. Throat: lips, mucosa, and tongue normal; teeth and gums normal Neck: no adenopathy, no carotid bruit, no JVD, supple, symmetrical, trachea midline and thyroid not enlarged, symmetric, no tenderness/mass/nodules Lungs: clear to auscultation bilaterally Heart: regular rate and rhythm, S1, S2 normal, no murmur, click, rub or gallop Abdomen: soft, non-tender; bowel sounds normal; no masses,  no organomegaly Extremities: extremities normal, atraumatic, no cyanosis or edema Pulses: 2+ and symmetric Skin: Skin color, texture, turgor normal.  Warm, dry, intact.  Hyperpigmentation of bilateral lower extremities/scarring.  Mild hyperpigmentation/chronic edematous skin changes in bilateral ankles.  A keloid less than 1 cm on left forearm and anterior right upper thigh Lymph nodes: Cervical, supraclavicular, and axillary nodes normal. Neurologic: Alert and oriented X 3, normal strength and tone. Normal symmetric reflexes. Normal coordination and gait    Assessment:    Healthy female exam with multiple complaints     Plan:     Anticipatory guidance given including wearing seatbelts, smoke detectors in the home, increasing physical activity, increasing p.o. intake of water and vegetables. -will obtain labs -Pap done 09/2019 per pt -will obtain labs -given handout -next CPE in 1 yr. See After Visit Summary for Counseling  Recommendations    Keloid -two lesions, one on left forearm and right upper thigh -Pt advised to avoid picking/irritating skin  Hyperpigmentation of skin  -2/2 patient picking at skin -Consider counseling - Plan: hydroquinone 4 % cream  Bilateral lower extremity edema  -Likely 2/2 gravity dependent edema -Discussed supportive care including elevating lower extremities, reducing sodium intake. -Consider compression socks - Plan: CMP with eGFR([facility])  Anxiety -PHQ-9 score 0 -GAD-7 score 6 -Patient strongly encouraged to consider counseling -Given handout  - Plan: TSH, T4, free, hydrocortisone (ANUSOL-HC) 2.5 % rectal cream  Hemorrhoids, unspecified hemorrhoid type  -Discussed supportive care -Given precautions -given handout - Plan: Ambulatory referral to Gastroenterology, hydrocortisone (ANUSOL-HC) 2.5 % rectal cream  Mixed hyperlipidemia  - Plan: Lipid panel  Prediabetes  -Hemoglobin A1c 5.9% on 02/27/2019 -Lifestyle modification strongly encouraged - Plan: Hemoglobin A1c  Cervical radiculopathy -Continue injections -Continue follow-up with Dr. Ernestina Patches and Dr. Junius Roads - Plan: Vitamin B12, Folate  F/u prn  Grier Mitts, MD

## 2020-04-02 NOTE — Patient Instructions (Addendum)
Preventive Care 21-39 Years Old, Female Preventive care refers to visits with your health care provider and lifestyle choices that can promote health and wellness. This includes:  A yearly physical exam. This may also be called an annual well check.  Regular dental visits and eye exams.  Immunizations.  Screening for certain conditions.  Healthy lifestyle choices, such as eating a healthy diet, getting regular exercise, not using drugs or products that contain nicotine and tobacco, and limiting alcohol use. What can I expect for my preventive care visit? Physical exam Your health care provider will check your:  Height and weight. This may be used to calculate body mass index (BMI), which tells if you are at a healthy weight.  Heart rate and blood pressure.  Skin for abnormal spots. Counseling Your health care provider may ask you questions about your:  Alcohol, tobacco, and drug use.  Emotional well-being.  Home and relationship well-being.  Sexual activity.  Eating habits.  Work and work environment.  Method of birth control.  Menstrual cycle.  Pregnancy history. What immunizations do I need?  Influenza (flu) vaccine  This is recommended every year. Tetanus, diphtheria, and pertussis (Tdap) vaccine  You may need a Td booster every 10 years. Varicella (chickenpox) vaccine  You may need this if you have not been vaccinated. Human papillomavirus (HPV) vaccine  If recommended by your health care provider, you may need three doses over 6 months. Measles, mumps, and rubella (MMR) vaccine  You may need at least one dose of MMR. You may also need a second dose. Meningococcal conjugate (MenACWY) vaccine  One dose is recommended if you are age 19-21 years and a first-year college student living in a residence hall, or if you have one of several medical conditions. You may also need additional booster doses. Pneumococcal conjugate (PCV13) vaccine  You may need  this if you have certain conditions and were not previously vaccinated. Pneumococcal polysaccharide (PPSV23) vaccine  You may need one or two doses if you smoke cigarettes or if you have certain conditions. Hepatitis A vaccine  You may need this if you have certain conditions or if you travel or work in places where you may be exposed to hepatitis A. Hepatitis B vaccine  You may need this if you have certain conditions or if you travel or work in places where you may be exposed to hepatitis B. Haemophilus influenzae type b (Hib) vaccine  You may need this if you have certain conditions. You may receive vaccines as individual doses or as more than one vaccine together in one shot (combination vaccines). Talk with your health care provider about the risks and benefits of combination vaccines. What tests do I need?  Blood tests  Lipid and cholesterol levels. These may be checked every 5 years starting at age 20.  Hepatitis C test.  Hepatitis B test. Screening  Diabetes screening. This is done by checking your blood sugar (glucose) after you have not eaten for a while (fasting).  Sexually transmitted disease (STD) testing.  BRCA-related cancer screening. This may be done if you have a family history of breast, ovarian, tubal, or peritoneal cancers.  Pelvic exam and Pap test. This may be done every 3 years starting at age 21. Starting at age 30, this may be done every 5 years if you have a Pap test in combination with an HPV test. Talk with your health care provider about your test results, treatment options, and if necessary, the need for more tests.   Follow these instructions at home: Eating and drinking   Eat a diet that includes fresh fruits and vegetables, whole grains, lean protein, and low-fat dairy.  Take vitamin and mineral supplements as recommended by your health care provider.  Do not drink alcohol if: ? Your health care provider tells you not to drink. ? You are  pregnant, may be pregnant, or are planning to become pregnant.  If you drink alcohol: ? Limit how much you have to 0-1 drink a day. ? Be aware of how much alcohol is in your drink. In the [place] one drink equals one 12 oz bottle of beer (355 mL), one 5 oz glass of wine (148 mL), or one 1 oz glass of hard liquor (44 mL). Lifestyle  Take daily care of your teeth and gums.  Stay active. Exercise for at least 30 minutes on 5 or more days each week.  Do not use any products that contain nicotine or tobacco, such as cigarettes, e-cigarettes, and chewing tobacco. If you need help quitting, ask your health care provider.  If you are sexually active, practice safe sex. Use a condom or other form of birth control (contraception) in order to prevent pregnancy and STIs (sexually transmitted infections). If you plan to become pregnant, see your health care provider for a preconception visit. What's next?  Visit your health care provider once a year for a well check visit.  Ask your health care provider how often you should have your eyes and teeth checked.  Stay up to date on all vaccines. This information is not intended to replace advice given to you by your health care provider. Make sure you discuss any questions you have with your health care provider. Document Revised: 03/30/2018 Document Reviewed: 03/30/2018 Elsevier Patient Education  2020 [place].  Prediabetes Eating Plan Prediabetes is a condition that causes blood sugar (glucose) levels to be higher than normal. This increases the risk for developing diabetes. In order to prevent diabetes from developing, your health care provider may recommend a diet and other lifestyle changes to help you:  Control your blood glucose levels.  Improve your cholesterol levels.  Manage your blood pressure. Your health care provider may recommend working with a diet and nutrition specialist (dietitian) to make a meal plan that is best for  you. What are tips for following this plan? Lifestyle  Set weight loss goals with the help of your health care team. It is recommended that most people with prediabetes lose 7% of their current body weight.  Exercise for at least 30 minutes at least 5 days a week.  Attend a support group or seek ongoing support from a mental health counselor.  Take over-the-counter and prescription medicines only as told by your health care provider. Reading food labels  Read food labels to check the amount of fat, salt (sodium), and sugar in prepackaged foods. Avoid foods that have: ? Saturated fats. ? Trans fats. ? Added sugars.  Avoid foods that have more than 300 milligrams (mg) of sodium per serving. Limit your daily sodium intake to less than 2,300 mg each day. Shopping  Avoid buying pre-made and processed foods. Cooking  Cook with olive oil. Do not use butter, lard, or ghee.  Bake, broil, grill, or boil foods. Avoid frying. Meal planning   Work with your dietitian to develop an eating plan that is right for you. This may include: ? Tracking how many calories you take in. Use a food diary, notebook, or mobile application  to track what you eat at each meal. ? Using the glycemic index (GI) to plan your meals. The index tells you how quickly a food will raise your blood glucose. Choose low-GI foods. These foods take a longer time to raise blood glucose.  Consider following a Mediterranean diet. This diet includes: ? Several servings each day of fresh fruits and vegetables. ? Eating fish at least twice a week. ? Several servings each day of whole grains, beans, nuts, and seeds. ? Using olive oil instead of other fats. ? Moderate alcohol consumption. ? Eating small amounts of red meat and whole-fat dairy.  If you have high blood pressure, you may need to limit your sodium intake or follow a diet such as the DASH eating plan. DASH is an eating plan that aims to lower high blood  pressure. What foods are recommended? The items listed below may not be a complete list. Talk with your dietitian about what dietary choices are best for you. Grains Whole grains, such as whole-wheat or whole-grain breads, crackers, cereals, and pasta. Unsweetened oatmeal. Bulgur. Barley. Quinoa. Brown rice. Corn or whole-wheat flour tortillas or taco shells. Vegetables Lettuce. Spinach. Peas. Beets. Cauliflower. Cabbage. Broccoli. Carrots. Tomatoes. Squash. Eggplant. Herbs. Peppers. Onions. Cucumbers. Brussels sprouts. Fruits Berries. Bananas. Apples. Oranges. Grapes. Papaya. Mango. Pomegranate. Kiwi. Grapefruit. Cherries. Meats and other protein foods Seafood. Poultry without skin. Lean cuts of pork and beef. Tofu. Eggs. Nuts. Beans. Dairy Low-fat or fat-free dairy products, such as yogurt, cottage cheese, and cheese. Beverages Water. Tea. Coffee. Sugar-free or diet soda. Seltzer water. Lowfat or no-fat milk. Milk alternatives, such as soy or almond milk. Fats and oils Olive oil. Canola oil. Sunflower oil. Grapeseed oil. Avocado. Walnuts. Sweets and desserts Sugar-free or low-fat pudding. Sugar-free or low-fat ice cream and other frozen treats. Seasoning and other foods Herbs. Sodium-free spices. Mustard. Relish. Low-fat, low-sugar ketchup. Low-fat, low-sugar barbecue sauce. Low-fat or fat-free mayonnaise. What foods are not recommended? The items listed below may not be a complete list. Talk with your dietitian about what dietary choices are best for you. Grains Refined white flour and flour products, such as bread, pasta, snack foods, and cereals. Vegetables Canned vegetables. Frozen vegetables with butter or cream sauce. Fruits Fruits canned with syrup. Meats and other protein foods Fatty cuts of meat. Poultry with skin. Breaded or fried meat. Processed meats. Dairy Full-fat yogurt, cheese, or milk. Beverages Sweetened drinks, such as sweet iced tea and soda. Fats and  oils Butter. Lard. Ghee. Sweets and desserts Baked goods, such as cake, cupcakes, pastries, cookies, and cheesecake. Seasoning and other foods Spice mixes with added salt. Ketchup. Barbecue sauce. Mayonnaise. Summary  To prevent diabetes from developing, you may need to make diet and other lifestyle changes to help control blood sugar, improve cholesterol levels, and manage your blood pressure.  Set weight loss goals with the help of your health care team. It is recommended that most people with prediabetes lose 7 percent of their current body weight.  Consider following a Mediterranean diet that includes plenty of fresh fruits and vegetables, whole grains, beans, nuts, seeds, fish, lean meat, low-fat dairy, and healthy oils. This information is not intended to replace advice given to you by your health care provider. Make sure you discuss any questions you have with your health care provider. Document Revised: 11/10/2018 Document Reviewed: 09/22/2016 Elsevier Patient Education  2020 [place].  Nonsurgical Procedures for Hemorrhoids  Nonsurgical procedures can be used to treat hemorrhoids. Hemorrhoids are swollen veins  that are inside the rectum (internal hemorrhoids) or around the anus (external hemorrhoids). They are caused by increased pressure in the anal area. This pressure may result from straining to have a bowel movement (constipation), diarrhea, pregnancy, obesity, anal sex, or sitting for long periods of time. Hemorrhoids can cause symptoms such as pain and bleeding. Various procedures may be done if diet changes, lifestyle changes, and other home treatments do not help your symptoms. Some of these procedures do not involve surgery. Tell a health care provider about:  Any allergies you have.  All medicines you are taking, including vitamins, herbs, eye drops, creams, and over-the-counter medicines.  Any problems you or family members have had with anesthetic  medicines.  Any blood disorders you have.  Any surgeries you have had.  Any medical conditions you have.  Whether you are pregnant or may be pregnant. What are the risks? Generally, this is a safe procedure. However, problems may occur, including:  Infection.  Bleeding.  Pain. What happens before the procedure?  Ask your health care provider about: ? Changing or stopping your regular medicines. This is especially important if you are taking diabetes medicines or blood thinners. ? Taking medicines such as aspirin and ibuprofen. These medicines can thin your blood. Do not take these medicines unless your health care provider tells you to take them. ? Taking over-the-counter medicines, vitamins, herbs, and supplements.  Follow instructions from your health care provider about eating or drinking restrictions.  You may need to have a procedure to examine the inside of your colon with a scope (colonoscopy). Your health care provider may do this to make sure that there are no other causes for your bleeding or pain. What happens during the procedure?   Your health care provider will clean your rectal area with a rinsing solution.  A lubricating jelly may be placed into your rectum. The jelly may contain a medicine to numb the area (local anesthetic).  Your health care provider will insert a short scope (anoscope) into your rectum to examine the hemorrhoids.  One of the following techniques will be used: ? Rubber band ligation. Your health care provider will place medical instruments through the scope to put rubber bands around the base of your hemorrhoids. The bands will cut off the blood supply to the hemorrhoids. The hemorrhoids will fall off after several days. ? Sclerotherapy. Your health care provider will inject medicine through the scope into your hemorrhoids. This will cause them to shrink and dry up. ? Infrared coagulation. Your health care provider will shine a type of light  through the scope onto your hemorrhoids. This light will generate energy (infrared radiation). It will cause the hemorrhoids to scar and then fall off. Each of these procedures may vary among health care providers and hospitals. What happens after the procedure?  You will be monitored to make sure that you have no bleeding.  Return to your normal activities as told by your health care provider. Summary  Hemorrhoids are swollen veins that are inside the rectum (internal hemorrhoids) or around the anus (external hemorrhoids).  Nonsurgical procedures can be used to treat hemorrhoids.  Rubber band ligation, sclerotherapy, or infrared coagulation may be used if dietary and lifestyle changes do not cause your hemorrhoids to go away.  Before the procedure, ask your health care provider about changing or stopping your regular medicines. This information is not intended to replace advice given to you by your health care provider. Make sure you discuss any questions  you have with your health care provider. Document Revised: 12/27/2018 Document Reviewed: 01/09/2018 Elsevier Patient Education  [place], Adult After being diagnosed with an anxiety disorder, you may be relieved to know why you have felt or behaved a certain way. You may also feel overwhelmed about the treatment ahead and what it will mean for your life. With care and support, you can manage this condition and recover from it. How to manage lifestyle changes Managing stress and anxiety  Stress is your body's reaction to life changes and events, both good and bad. Most stress will last just a few hours, but stress can be ongoing and can lead to more than just stress. Although stress can play a major role in anxiety, it is not the same as anxiety. Stress is usually caused by something external, such as a deadline, test, or competition. Stress normally passes after the triggering event has ended.  Anxiety is  caused by something internal, such as imagining a terrible outcome or worrying that something will go wrong that will devastate you. Anxiety often does not go away even after the triggering event is over, and it can become long-term (chronic) worry. It is important to understand the differences between stress and anxiety and to manage your stress effectively so that it does not lead to an anxious response. Talk with your health care provider or a counselor to learn more about reducing anxiety and stress. He or she may suggest tension reduction techniques, such as:  Music therapy. This can include creating or listening to music that you enjoy and that inspires you.  Mindfulness-based meditation. This involves being aware of your normal breaths while not trying to control your breathing. It can be done while sitting or walking.  Centering prayer. This involves focusing on a word, phrase, or sacred image that means something to you and brings you peace.  Deep breathing. To do this, expand your stomach and inhale slowly through your nose. Hold your breath for 3-5 seconds. Then exhale slowly, letting your stomach muscles relax.  Self-talk. This involves identifying thought patterns that lead to anxiety reactions and changing those patterns.  Muscle relaxation. This involves tensing muscles and then relaxing them. Choose a tension reduction technique that suits your lifestyle and personality. These techniques take time and practice. Set aside 5-15 minutes a day to do them. Therapists can offer counseling and training in these techniques. The training to help with anxiety may be covered by some insurance plans. Other things you can do to manage stress and anxiety include:  Keeping a stress/anxiety diary. This can help you learn what triggers your reaction and then learn ways to manage your response.  Thinking about how you react to certain situations. You may not be able to control everything, but you can  control your response.  Making time for activities that help you relax and not feeling guilty about spending your time in this way.  Visual imagery and yoga can help you stay calm and relax.  Medicines Medicines can help ease symptoms. Medicines for anxiety include:  Anti-anxiety drugs.  Antidepressants. Medicines are often used as a primary treatment for anxiety disorder. Medicines will be prescribed by a health care provider. When used together, medicines, psychotherapy, and tension reduction techniques may be the most effective treatment. Relationships Relationships can play a big part in helping you recover. Try to spend more time connecting with trusted friends and family members. Consider going to couples counseling, taking family education  classes, or going to family therapy. Therapy can help you and others better understand your condition. How to recognize changes in your anxiety Everyone responds differently to treatment for anxiety. Recovery from anxiety happens when symptoms decrease and stop interfering with your daily activities at home or work. This may mean that you will start to:  Have better concentration and focus. Worry will interfere less in your daily thinking.  Sleep better.  Be less irritable.  Have more energy.  Have improved memory. It is important to recognize when your condition is getting worse. Contact your health care provider if your symptoms interfere with home or work and you feel like your condition is not improving. Follow these instructions at home: Activity  Exercise. Most adults should do the following: ? Exercise for at least 150 minutes each week. The exercise should increase your heart rate and make you sweat (moderate-intensity exercise). ? Strengthening exercises at least twice a week.  Get the right amount and quality of sleep. Most adults need 7-9 hours of sleep each night. Lifestyle   Eat a healthy diet that includes plenty of  vegetables, fruits, whole grains, low-fat dairy products, and lean protein. Do not eat a lot of foods that are high in solid fats, added sugars, or salt.  Make choices that simplify your life.  Do not use any products that contain nicotine or tobacco, such as cigarettes, e-cigarettes, and chewing tobacco. If you need help quitting, ask your health care provider.  Avoid caffeine, alcohol, and certain over-the-counter cold medicines. These may make you feel worse. Ask your pharmacist which medicines to avoid. General instructions  Take over-the-counter and prescription medicines only as told by your health care provider.  Keep all follow-up visits as told by your health care provider. This is important. Where to find support You can get help and support from these sources:  Self-help groups.  Online and [employer].  A trusted spiritual leader.  Couples counseling.  Family education classes.  Family therapy. Where to find more information You may find that joining a support group helps you deal with your anxiety. The following sources can help you locate counselors or support groups near you:  [place]: www.mentalhealthamerica.net  Anxiety and Depression Association of [place] (ADAA): https://www.clark.net/  National Alliance on Mental Illness (NAMI): www.nami.org Contact a health care provider if you:  Have a hard time staying focused or finishing daily tasks.  Spend many hours a day feeling worried about everyday life.  Become exhausted by worry.  Start to have headaches, feel tense, or have nausea.  Urinate more than normal.  Have diarrhea. Get help right away if you have:  A racing heart and shortness of breath.  Thoughts of hurting yourself or others. If you ever feel like you may hurt yourself or others, or have thoughts about taking your own life, get help right away. You can go to your nearest emergency department or call:  Your local  emergency services (911 in the [place]  A suicide crisis helpline, such as the [place] at (785)170-7819. This is open 24 hours a day. Summary  Taking steps to learn and use tension reduction techniques can help calm you and help prevent triggering an anxiety reaction.  When used together, medicines, psychotherapy, and tension reduction techniques may be the most effective treatment.  Family, friends, and partners can play a big part in helping you recover from an anxiety disorder. This information is not intended to replace advice given  to you by your health care provider. Make sure you discuss any questions you have with your health care provider. Document Revised: 12/19/2018 Document Reviewed: 12/19/2018 Elsevier Patient Education  [place].  Hemorrhoids Hemorrhoids are swollen veins that may develop:  In the butt (rectum). These are called internal hemorrhoids.  Around the opening of the butt (anus). These are called external hemorrhoids. Hemorrhoids can cause pain, itching, or bleeding. Most of the time, they do not cause serious problems. They usually get better with diet changes, lifestyle changes, and other home treatments. What are the causes? This condition may be caused by:  Having trouble pooping (constipation).  Pushing hard (straining) to poop.  Watery poop (diarrhea).  Pregnancy.  Being very overweight (obese).  Sitting for long periods of time.  Heavy lifting or other activity that causes you to strain.  Anal sex.  Riding a bike for a long period of time. What are the signs or symptoms? Symptoms of this condition include:  Pain.  Itching or soreness in the butt.  Bleeding from the butt.  Leaking poop.  Swelling in the area.  One or more lumps around the opening of your butt. How is this diagnosed? A doctor can often diagnose this condition by looking at the affected area. The doctor may also:  Do an exam  that involves feeling the area with a gloved hand (digital rectal exam).  Examine the area inside your butt using a small tube (anoscope).  Order blood tests. This may be done if you have lost a lot of blood.  Have you get a test that involves looking inside the colon using a flexible tube with a camera on the end (sigmoidoscopy or colonoscopy). How is this treated? This condition can usually be treated at home. Your doctor may tell you to change what you eat, make lifestyle changes, or try home treatments. If these do not help, procedures can be done to remove the hemorrhoids or make them smaller. These may involve:  Placing rubber bands at the base of the hemorrhoids to cut off their blood supply.  Injecting medicine into the hemorrhoids to shrink them.  Shining a type of light energy onto the hemorrhoids to cause them to fall off.  Doing surgery to remove the hemorrhoids or cut off their blood supply. Follow these instructions at home: Eating and drinking   Eat foods that have a lot of fiber in them. These include whole grains, beans, nuts, fruits, and vegetables.  Ask your doctor about taking products that have added fiber (fibersupplements).  Reduce the amount of fat in your diet. You can do this by: ? Eating low-fat dairy products. ? Eating less red meat. ? Avoiding processed foods.  Drink enough fluid to keep your pee (urine) pale yellow. Managing pain and swelling   Take a warm-water bath (sitz bath) for 20 minutes to ease pain. Do this 3-4 times a day. You may do this in a bathtub or using a portable sitz bath that fits over the toilet.  If told, put ice on the painful area. It may be helpful to use ice between your warm baths. ? Put ice in a plastic bag. ? Place a towel between your skin and the bag. ? Leave the ice on for 20 minutes, 2-3 times a day. General instructions  Take over-the-counter and prescription medicines only as told by your doctor. ? Medicated  creams and medicines may be used as told.  Exercise often. Ask your doctor how much  and what kind of exercise is best for you.  Go to the bathroom when you have the urge to poop. Do not wait.  Avoid pushing too hard when you poop.  Keep your butt dry and clean. Use wet toilet paper or moist towelettes after pooping.  Do not sit on the toilet for a long time.  Keep all follow-up visits as told by your doctor. This is important. Contact a doctor if you:  Have pain and swelling that do not get better with treatment or medicine.  Have trouble pooping.  Cannot poop.  Have pain or swelling outside the area of the hemorrhoids. Get help right away if you have:  Bleeding that will not stop. Summary  Hemorrhoids are swollen veins in the butt or around the opening of the butt.  They can cause pain, itching, or bleeding.  Eat foods that have a lot of fiber in them. These include whole grains, beans, nuts, fruits, and vegetables.  Take a warm-water bath (sitz bath) for 20 minutes to ease pain. Do this 3-4 times a day. This information is not intended to replace advice given to you by your health care provider. Make sure you discuss any questions you have with your health care provider. Document Revised: 07/27/2018 Document Reviewed: 12/08/2017 Elsevier Patient Education  [place].

## 2020-04-03 ENCOUNTER — Ambulatory Visit: Payer: Self-pay

## 2020-04-03 ENCOUNTER — Encounter: Payer: Self-pay | Admitting: Family Medicine

## 2020-04-03 ENCOUNTER — Other Ambulatory Visit: Payer: Self-pay

## 2020-04-03 ENCOUNTER — Encounter: Payer: Self-pay | Admitting: Physical Medicine and Rehabilitation

## 2020-04-03 ENCOUNTER — Ambulatory Visit (INDEPENDENT_AMBULATORY_CARE_PROVIDER_SITE_OTHER): Payer: Federal, State, Local not specified - PPO | Admitting: Physical Medicine and Rehabilitation

## 2020-04-03 VITALS — BP 127/80 | HR 89

## 2020-04-03 DIAGNOSIS — M5010 Cervical disc disorder with radiculopathy, unspecified cervical region: Secondary | ICD-10-CM

## 2020-04-03 DIAGNOSIS — M5412 Radiculopathy, cervical region: Secondary | ICD-10-CM | POA: Diagnosis not present

## 2020-04-03 LAB — CBC WITH DIFFERENTIAL/PLATELET
Absolute Monocytes: 440 cells/uL (ref 200–950)
Basophils Absolute: 66 cells/uL (ref 0–200)
Basophils Relative: 0.6 %
Eosinophils Absolute: 264 cells/uL (ref 15–500)
Eosinophils Relative: 2.4 %
HCT: 40.1 % (ref 35.0–45.0)
Hemoglobin: 13 g/dL (ref 11.7–15.5)
Lymphs Abs: 3377 cells/uL (ref 850–3900)
MCH: 28.6 pg (ref 27.0–33.0)
MCHC: 32.4 g/dL (ref 32.0–36.0)
MCV: 88.1 fL (ref 80.0–100.0)
MPV: 10.5 fL (ref 7.5–12.5)
Monocytes Relative: 4 %
Neutro Abs: 6853 cells/uL (ref 1500–7800)
Neutrophils Relative %: 62.3 %
Platelets: 466 10*3/uL — ABNORMAL HIGH (ref 140–400)
RBC: 4.55 10*6/uL (ref 3.80–5.10)
RDW: 12.7 % (ref 11.0–15.0)
Total Lymphocyte: 30.7 %
WBC: 11 10*3/uL — ABNORMAL HIGH (ref 3.8–10.8)

## 2020-04-03 LAB — COMPLETE METABOLIC PANEL WITH GFR
AG Ratio: 1.4 (calc) (ref 1.0–2.5)
ALT: 17 U/L (ref 6–29)
AST: 17 U/L (ref 10–30)
Albumin: 4.1 g/dL (ref 3.6–5.1)
Alkaline phosphatase (APISO): 64 U/L (ref 31–125)
BUN: 10 mg/dL (ref 7–25)
CO2: 25 mmol/L (ref 20–32)
Calcium: 9.2 mg/dL (ref 8.6–10.2)
Chloride: 103 mmol/L (ref 98–110)
Creat: 0.88 mg/dL (ref 0.50–1.10)
GFR, Est African American: 97 mL/min/{1.73_m2} (ref >=60)
GFR, Est Non African American: 83 mL/min/{1.73_m2} (ref >=60)
Globulin: 3 g/dL (calc) (ref 1.9–3.7)
Glucose, Bld: 93 mg/dL (ref 65–99)
Potassium: 4.1 mmol/L (ref 3.5–5.3)
Sodium: 137 mmol/L (ref 135–146)
Total Bilirubin: 0.8 mg/dL (ref 0.2–1.2)
Total Protein: 7.1 g/dL (ref 6.1–8.1)

## 2020-04-03 LAB — HEMOGLOBIN A1C
Hgb A1c MFr Bld: 5.6 % of total Hgb (ref <5.7)
Mean Plasma Glucose: 114 (calc)
eAG (mmol/L): 6.3 (calc)

## 2020-04-03 LAB — LIPID PANEL
Cholesterol: 213 mg/dL — ABNORMAL HIGH (ref <200)
HDL: 46 mg/dL — ABNORMAL LOW (ref >=50)
LDL Cholesterol (Calc): 140 mg/dL (calc) — ABNORMAL HIGH
Non-HDL Cholesterol (Calc): 167 mg/dL (calc) — ABNORMAL HIGH (ref <130)
Total CHOL/HDL Ratio: 4.6 (calc) (ref <5.0)
Triglycerides: 144 mg/dL (ref <150)

## 2020-04-03 LAB — VITAMIN B12: Vitamin B-12: 326 pg/mL (ref 200–1100)

## 2020-04-03 LAB — TSH: TSH: 2.17 mIU/L

## 2020-04-03 LAB — T4, FREE: Free T4: 1.3 ng/dL (ref 0.8–1.8)

## 2020-04-03 LAB — FOLATE: Folate: 12.5 ng/mL

## 2020-04-03 MED ORDER — METHYLPREDNISOLONE ACETATE 80 MG/ML IJ SUSP
80.0000 mg | Freq: Once | INTRAMUSCULAR | Status: AC
  Administered 2020-04-03: 80 mg

## 2020-04-03 NOTE — Progress Notes (Signed)
Pt state that her right shoulder pain. Pt state it hurts more when her laying down for bed. Pt state switching position helps with the pain.  Numeric Pain Rating Scale and Functional Assessment Average Pain 3   In the last MONTH (on 0-10 scale) has pain interfered with the following?  1. General activity like being  able to carry out your everyday physical activities such as walking, climbing stairs, carrying groceries, or moving a chair?  Rating(7)   +Driver, -BT, -Dye Allergies.

## 2020-04-03 NOTE — Progress Notes (Signed)
Cheyenne [name] - 39 y.o. female MRN [id number]  Date of birth: [birth year]  Office Visit Note: Visit Date: 04/03/2020 PCP: Billie Ruddy, MD Referred by: Billie Ruddy, MD  Subjective: Chief Complaint  Patient presents with  . Right Shoulder - Pain   HPI:  Cheyenne [name] is a 39 y.o. female who comes in today at the request of Dr. Eunice Blase for planned Right C7-T1 Cervical epidural steroid injection with fluoroscopic guidance.  The patient has failed conservative care including home exercise, medications, time and activity modification.  This injection will be diagnostic and hopefully therapeutic.  Please see requesting physician notes for further details and justification.  MRI reviewed with images and spine model.  MRI reviewed in the note below.    ROS Otherwise per HPI.  Assessment & Plan: Visit Diagnoses:  1. Cervical radiculopathy   2. Cervical disc disorder with radiculopathy     Plan: No additional findings.   Meds & Orders:  Meds ordered this encounter  Medications  . methylPREDNISolone acetate (DEPO-MEDROL) injection 80 mg    Orders Placed This Encounter  Procedures  . XR C-ARM NO REPORT  . Epidural Steroid injection    Follow-up: Return in about 2 weeks (around 04/17/2020) for Possible repeat.   Procedures: No procedures performed  Cervical Epidural Steroid Injection - Interlaminar Approach with Fluoroscopic Guidance  Patient: Cheyenne [name]      Date of Birth: 26-Jun-1981 MRN: [id number] PCP: Billie Ruddy, MD      Visit Date: 04/03/2020   Universal Protocol:    Date/Time: 09/02/218:17 AM  Consent Given By: the patient  Position: PRONE  Additional Comments: Vital signs were monitored before and after the procedure. Patient was prepped and draped in the usual sterile fashion. The correct patient, procedure, and site was verified.   Injection Procedure Details:  Procedure Site One Meds Administered:  Meds ordered this  encounter  Medications  . methylPREDNISolone acetate (DEPO-MEDROL) injection 80 mg     Laterality: Right  Location/Site: C7-T1  Needle size: 20 G  Needle type: Touhy  Needle Placement: Paramedian epidural space  Findings:  -Comments: Excellent flow of contrast into the epidural space.  Procedure Details: Using a paramedian approach from the side mentioned above, the region overlying the inferior lamina was localized under fluoroscopic visualization and the soft tissues overlying this structure were infiltrated with 4 ml. of 1% Lidocaine without Epinephrine. A # 20 gauge, Tuohy needle was inserted into the epidural space using a paramedian approach.  The epidural space was localized using loss of resistance along with lateral and contralateral oblique bi-planar fluoroscopic views.  After negative aspirate for air, blood, and CSF, a 2 ml. volume of Isovue-250 was injected into the epidural space and the flow of contrast was observed. Radiographs were obtained for documentation purposes.   The injectate was administered into the level noted above.  Additional Comments:  The patient tolerated the procedure well Dressing: 2 x 2 sterile gauze and Band-Aid    Post-procedure details: Patient was observed during the procedure. Post-procedure instructions were reviewed.  Patient left the clinic in stable condition.     Clinical History: Neck pain with right shoulder and arm pain  EXAM: MRI CERVICAL SPINE WITHOUT CONTRAST  TECHNIQUE: Multiplanar, multisequence MR imaging of the cervical spine was performed. No intravenous contrast was administered.  COMPARISON:  None.  FINDINGS: Alignment: Normal  Vertebrae: Normal bone marrow.  Negative for fracture or mass.  Cord: Normal spinal cord signal. No cord lesion  or cord compression.  Posterior Fossa, vertebral arteries, paraspinal tissues: Negative  Disc levels:  C2-3: Negative  C3-4: Mild disc degeneration  and mild uncinate spurring. No significant stenosis  C4-5: Negative  C5-6: Mild disc degeneration and mild uncinate spurring. Mild right foraminal narrowing.  C6-7: Moderate to large disc protrusion on the right. Probable extruded disc fragment extending into the foramen with compression of the right C7 nerve root. Mild associated uncinate spurring. Mild cord flattening on the right and mild spinal stenosis.  C7-T1: Negative  IMPRESSION: Moderate to large extruded disc fragment on the right at C6-7 extending into the foramen and compressing the right C7 nerve root. Mild spinal stenosis.   Electronically Signed   By: Franchot Gallo M.D.   On: 03/21/2020 13:11     Objective:  VS:  HT:    WT:   BMI:     BP:127/80  HR:89bpm  TEMP: ( )  RESP:  Physical Exam Vitals and nursing note reviewed.  Constitutional:      General: She is not in acute distress.    Appearance: Normal appearance. She is not ill-appearing.  HENT:     Head: Normocephalic and atraumatic.     Right Ear: External ear normal.     Left Ear: External ear normal.  Eyes:     Extraocular Movements: Extraocular movements intact.  Cardiovascular:     Rate and Rhythm: Normal rate.     Pulses: Normal pulses.  Musculoskeletal:     Cervical back: Tenderness present. No rigidity.     Right lower leg: No edema.     Left lower leg: No edema.     Comments: Patient has good strength in the upper extremities including 5 out of 5 strength in wrist extension long finger flexion and APB.  There is no atrophy of the hands intrinsically.  There is a negative Hoffmann's test.   Lymphadenopathy:     Cervical: No cervical adenopathy.  Skin:    Findings: No erythema, lesion or rash.  Neurological:     General: No focal deficit present.     Mental Status: She is alert and oriented to person, place, and time.     Sensory: No sensory deficit.     Motor: No weakness or abnormal muscle tone.     Coordination:  Coordination normal.  Psychiatric:        Mood and Affect: Mood normal.        Behavior: Behavior normal.      Imaging: No results found.

## 2020-04-03 NOTE — Procedures (Signed)
Cervical Epidural Steroid Injection - Interlaminar Approach with Fluoroscopic Guidance  Patient: Cheyenne [name]      Date of Birth: 29-May-1981 MRN: [id number] PCP: Billie Ruddy, MD      Visit Date: 04/03/2020   Universal Protocol:    Date/Time: 09/02/218:17 AM  Consent Given By: the patient  Position: PRONE  Additional Comments: Vital signs were monitored before and after the procedure. Patient was prepped and draped in the usual sterile fashion. The correct patient, procedure, and site was verified.   Injection Procedure Details:  Procedure Site One Meds Administered:  Meds ordered this encounter  Medications  . methylPREDNISolone acetate (DEPO-MEDROL) injection 80 mg     Laterality: Right  Location/Site: C7-T1  Needle size: 20 G  Needle type: Touhy  Needle Placement: Paramedian epidural space  Findings:  -Comments: Excellent flow of contrast into the epidural space.  Procedure Details: Using a paramedian approach from the side mentioned above, the region overlying the inferior lamina was localized under fluoroscopic visualization and the soft tissues overlying this structure were infiltrated with 4 ml. of 1% Lidocaine without Epinephrine. A # 20 gauge, Tuohy needle was inserted into the epidural space using a paramedian approach.  The epidural space was localized using loss of resistance along with lateral and contralateral oblique bi-planar fluoroscopic views.  After negative aspirate for air, blood, and CSF, a 2 ml. volume of Isovue-250 was injected into the epidural space and the flow of contrast was observed. Radiographs were obtained for documentation purposes.   The injectate was administered into the level noted above.  Additional Comments:  The patient tolerated the procedure well Dressing: 2 x 2 sterile gauze and Band-Aid    Post-procedure details: Patient was observed during the procedure. Post-procedure instructions were reviewed.  Patient  left the clinic in stable condition.

## 2020-04-04 MED ORDER — IBUPROFEN 800 MG PO TABS: 800.0000 mg | ORAL_TABLET | Freq: Three times a day (TID) | ORAL | 3 refills | Status: DC | PRN

## 2020-04-21 ENCOUNTER — Encounter: Payer: Self-pay | Admitting: Physical Medicine and Rehabilitation

## 2020-04-21 ENCOUNTER — Telehealth: Payer: Self-pay | Admitting: Physical Medicine and Rehabilitation

## 2020-04-21 NOTE — Telephone Encounter (Signed)
Pt called stating on her EOB her injection with Dr.Newton from 21-Apr-2020 was coded as a surgery and the pt would like a CB to further discuss wether this is correct or not.  [phone]

## 2020-04-21 NOTE — Telephone Encounter (Signed)
Can you check this? 

## 2020-04-22 ENCOUNTER — Other Ambulatory Visit: Payer: Self-pay | Admitting: Physical Medicine and Rehabilitation

## 2020-04-23 ENCOUNTER — Other Ambulatory Visit: Payer: Self-pay

## 2020-04-23 ENCOUNTER — Ambulatory Visit (INDEPENDENT_AMBULATORY_CARE_PROVIDER_SITE_OTHER): Payer: Federal, State, Local not specified - PPO | Admitting: Physical Medicine and Rehabilitation

## 2020-04-23 ENCOUNTER — Ambulatory Visit: Payer: Self-pay

## 2020-04-23 ENCOUNTER — Encounter: Payer: Self-pay | Admitting: Physical Medicine and Rehabilitation

## 2020-04-23 VITALS — BP 131/80 | HR 99

## 2020-04-23 DIAGNOSIS — M5010 Cervical disc disorder with radiculopathy, unspecified cervical region: Secondary | ICD-10-CM | POA: Diagnosis not present

## 2020-04-23 DIAGNOSIS — M5412 Radiculopathy, cervical region: Secondary | ICD-10-CM

## 2020-04-23 MED ORDER — METHYLPREDNISOLONE ACETATE 80 MG/ML IJ SUSP
80.0000 mg | Freq: Once | INTRAMUSCULAR | Status: AC
  Administered 2020-04-23: 80 mg

## 2020-04-23 NOTE — Telephone Encounter (Signed)
Please advise 

## 2020-04-23 NOTE — Progress Notes (Signed)
Pt state neck pain. Pt state sitting in a chair she feel little pain. Pt state when she takes her blood pressure on her left arm she feels tingling in her right arm. Pt has hx of inj on 04/03/20 pt state that the inj was great but lately she has been feel tingling here and there.  Numeric Pain Rating Scale and Functional Assessment Average Pain 2   In the last MONTH (on 0-10 scale) has pain interfered with the following?  1. General activity like being  able to carry out your everyday physical activities such as walking, climbing stairs, carrying groceries, or moving a chair?  Rating(5)   +Driver, -BT, -Dye Allergies.

## 2020-04-28 NOTE — Progress Notes (Signed)
Cheyenne [name] - 39 y.o. female MRN [id number]  Date of birth: 09/08/1980  Office Visit Note: Visit Date: 04/23/2020 PCP: Billie Ruddy, MD Referred by: Billie Ruddy, MD  Subjective: Chief Complaint  Patient presents with  . Neck - Pain   HPI:  Cheyenne [name] is a 39 y.o. female who comes in today for planned repeat Right C7-T1 Cervical epidural steroid injection with fluoroscopic guidance.  The patient has failed conservative care including home exercise, medications, time and activity modification.  This injection will be diagnostic and hopefully therapeutic.  Please see requesting physician notes for further details and justification. Patient received more than 50% pain relief from prior injection.   Referring: Dr. Eunice Blase   Patient is still having symptoms likely from this fairly large disc protrusion with may be extruded fragment on the right.  This is at C6-7.  She did well with epidural injection a few weeks ago on 9-21.  Her average pain is dropped quite a bit but she still having symptoms and we are going to repeat the injection x1.  Will probably just watch her and she can follow-up with Dr. Eunice Blase for more conservative continued care.  Depending on flareups obviously repeating the injection can be done.  If just not obtaining long-term relief she may wish to undergo ACDF.  ROS Otherwise per HPI.  Assessment & Plan: Visit Diagnoses:  1. Cervical radiculopathy   2. Cervical disc disorder with radiculopathy     Plan: No additional findings.   Meds & Orders:  Meds ordered this encounter  Medications  . methylPREDNISolone acetate (DEPO-MEDROL) injection 80 mg    Orders Placed This Encounter  Procedures  . XR C-ARM NO REPORT  . Epidural Steroid injection    Follow-up: Return if symptoms worsen or fail to improve.   Procedures: No procedures performed  Cervical Epidural Steroid Injection - Interlaminar Approach with Fluoroscopic  Guidance  Patient: Cheyenne [name]      Date of Birth: April 04, 1981 MRN: [id number] PCP: Billie Ruddy, MD      Visit Date: 04/23/2020   Universal Protocol:    Date/Time: 09/27/215:47 AM  Consent Given By: the patient  Position: PRONE  Additional Comments: Vital signs were monitored before and after the procedure. Patient was prepped and draped in the usual sterile fashion. The correct patient, procedure, and site was verified.   Injection Procedure Details:  Procedure Site One Meds Administered:  Meds ordered this encounter  Medications  . methylPREDNISolone acetate (DEPO-MEDROL) injection 80 mg     Laterality: Right  Location/Site: C7-T1  Needle size: 20 G  Needle type: Touhy  Needle Placement: Paramedian epidural space  Findings:  -Comments: Excellent flow of contrast into the epidural space.  Procedure Details: Using a paramedian approach from the side mentioned above, the region overlying the inferior lamina was localized under fluoroscopic visualization and the soft tissues overlying this structure were infiltrated with 4 ml. of 1% Lidocaine without Epinephrine. A # 20 gauge, Tuohy needle was inserted into the epidural space using a paramedian approach.  The epidural space was localized using loss of resistance along with lateral and contralateral oblique bi-planar fluoroscopic views.  After negative aspirate for air, blood, and CSF, a 2 ml. volume of Isovue-250 was injected into the epidural space and the flow of contrast was observed. Radiographs were obtained for documentation purposes.   The injectate was administered into the level noted above.  Additional Comments:  The patient tolerated the procedure well Dressing:  2 x 2 sterile gauze and Band-Aid    Post-procedure details: Patient was observed during the procedure. Post-procedure instructions were reviewed.  Patient left the clinic in stable condition.     Clinical History: Neck pain with  right shoulder and arm pain  EXAM: MRI CERVICAL SPINE WITHOUT CONTRAST  TECHNIQUE: Multiplanar, multisequence MR imaging of the cervical spine was performed. No intravenous contrast was administered.  COMPARISON:  None.  FINDINGS: Alignment: Normal  Vertebrae: Normal bone marrow.  Negative for fracture or mass.  Cord: Normal spinal cord signal. No cord lesion or cord compression.  Posterior Fossa, vertebral arteries, paraspinal tissues: Negative  Disc levels:  C2-3: Negative  C3-4: Mild disc degeneration and mild uncinate spurring. No significant stenosis  C4-5: Negative  C5-6: Mild disc degeneration and mild uncinate spurring. Mild right foraminal narrowing.  C6-7: Moderate to large disc protrusion on the right. Probable extruded disc fragment extending into the foramen with compression of the right C7 nerve root. Mild associated uncinate spurring. Mild cord flattening on the right and mild spinal stenosis.  C7-T1: Negative  IMPRESSION: Moderate to large extruded disc fragment on the right at C6-7 extending into the foramen and compressing the right C7 nerve root. Mild spinal stenosis.   Electronically Signed   By: Franchot Gallo M.D.   On: 03/21/2020 13:11     Objective:  VS:  HT:    WT:   BMI:     BP:131/80  HR:99bpm  TEMP: ( )  RESP:  Physical Exam Vitals and nursing note reviewed.  Constitutional:      General: She is not in acute distress.    Appearance: Normal appearance. She is not ill-appearing.  HENT:     Head: Normocephalic and atraumatic.     Right Ear: External ear normal.     Left Ear: External ear normal.  Eyes:     Extraocular Movements: Extraocular movements intact.  Cardiovascular:     Rate and Rhythm: Normal rate.     Pulses: Normal pulses.  Musculoskeletal:     Cervical back: Tenderness present. No rigidity.     Right lower leg: No edema.     Left lower leg: No edema.     Comments: Patient has good  strength in the upper extremities including 5 out of 5 strength in wrist extension long finger flexion and APB.  There is no atrophy of the hands intrinsically.  There is a negative Hoffmann's test.   Lymphadenopathy:     Cervical: No cervical adenopathy.  Skin:    Findings: No erythema, lesion or rash.  Neurological:     General: No focal deficit present.     Mental Status: She is alert and oriented to person, place, and time.     Sensory: Sensory deficit present.     Motor: No weakness or abnormal muscle tone.     Coordination: Coordination normal.  Psychiatric:        Mood and Affect: Mood normal.        Behavior: Behavior normal.      Imaging: No results found.

## 2020-04-28 NOTE — Procedures (Signed)
Cervical Epidural Steroid Injection - Interlaminar Approach with Fluoroscopic Guidance  Patient: Cheyenne [name]      Date of Birth: 1980/08/10 MRN: [id number] PCP: Billie Ruddy, MD      Visit Date: 04/23/2020   Universal Protocol:    Date/Time: 09/27/215:47 AM  Consent Given By: the patient  Position: PRONE  Additional Comments: Vital signs were monitored before and after the procedure. Patient was prepped and draped in the usual sterile fashion. The correct patient, procedure, and site was verified.   Injection Procedure Details:  Procedure Site One Meds Administered:  Meds ordered this encounter  Medications  . methylPREDNISolone acetate (DEPO-MEDROL) injection 80 mg     Laterality: Right  Location/Site: C7-T1  Needle size: 20 G  Needle type: Touhy  Needle Placement: Paramedian epidural space  Findings:  -Comments: Excellent flow of contrast into the epidural space.  Procedure Details: Using a paramedian approach from the side mentioned above, the region overlying the inferior lamina was localized under fluoroscopic visualization and the soft tissues overlying this structure were infiltrated with 4 ml. of 1% Lidocaine without Epinephrine. A # 20 gauge, Tuohy needle was inserted into the epidural space using a paramedian approach.  The epidural space was localized using loss of resistance along with lateral and contralateral oblique bi-planar fluoroscopic views.  After negative aspirate for air, blood, and CSF, a 2 ml. volume of Isovue-250 was injected into the epidural space and the flow of contrast was observed. Radiographs were obtained for documentation purposes.   The injectate was administered into the level noted above.  Additional Comments:  The patient tolerated the procedure well Dressing: 2 x 2 sterile gauze and Band-Aid    Post-procedure details: Patient was observed during the procedure. Post-procedure instructions were reviewed.  Patient  left the clinic in stable condition.

## 2020-04-30 ENCOUNTER — Ambulatory Visit: Payer: Federal, State, Local not specified - PPO | Admitting: Physical Medicine and Rehabilitation

## 2020-05-27 ENCOUNTER — Telehealth: Payer: Self-pay | Admitting: Physical Medicine and Rehabilitation

## 2020-06-02 ENCOUNTER — Ambulatory Visit: Payer: Federal, State, Local not specified - PPO | Admitting: Gastroenterology

## 2020-06-02 ENCOUNTER — Encounter: Payer: Self-pay | Admitting: Gastroenterology

## 2020-06-02 VITALS — BP 116/68 | HR 88 | Ht 62.0 in | Wt 188.0 lb

## 2020-06-02 DIAGNOSIS — K625 Hemorrhage of anus and rectum: Secondary | ICD-10-CM | POA: Diagnosis not present

## 2020-06-02 DIAGNOSIS — K602 Anal fissure, unspecified: Secondary | ICD-10-CM

## 2020-06-02 MED ORDER — DILTIAZEM GEL 2 %: CUTANEOUS | 0 refills | Status: DC

## 2020-06-02 NOTE — Patient Instructions (Addendum)
We have sent a prescription for Diltiazem 2% gel to [facility] for you. Using your index finger, you should apply a small amount of medication inside the rectum up to your first knuckle/joint twice daily x 4 weeks.  [facility]'s information is below: Address: [address], [place], [place] [address]  [phone]  *Please DO NOT go directly from our office to pick up this medication! Give the pharmacy 1 day to process the prescription as this is compounded and takes time to make. ___________________________________________________________ Please follow up with Dr Havery Moros on 07/08/20 @ 11:20 am ___________________________________________________________  We have given you some samples of [employer] to take. ____________________________________________________________ If you are age 39 or younger, your body mass index should be between 19-25. Your Body mass index is 34.39 kg/m. If this is out of the aformentioned range listed, please consider follow up with your Primary Care Provider.  ___________________________________________________________ Due to recent changes in healthcare laws, you may see the results of your imaging and laboratory studies on MyChart before your provider has had a chance to review them.  We understand that in some cases there may be results that are confusing or concerning to you. Not all laboratory results come back in the same time frame and the provider may be waiting for multiple results in order to interpret others.  Please give [place] 48 hours in order for your provider to thoroughly review all the results before contacting the office for clarification of your results.

## 2020-06-02 NOTE — Progress Notes (Signed)
HPI :  39 year old female without any significant medical history, referred here by Dr. Grier Mitts for rectal bleeding.  She states for the past year she has had intermittent blood in her stool as well as on the toilet paper.  She notices blood in the stool infrequently, however she mostly has blood noted on the toilet paper when wiping herself after bowel movement.  She denies any perianal pain or rectal pain when this happens.  She denies any constipation or diarrhea.  No straining.  She denies any eating problems.  No family history of colon cancer or IBD.  She states she may have had a colonoscopy as a child but it was a very long time ago.  She is not really been on any bowel regimen.  She has been given some Anusol cream for which she is taken for the past month or so which has not helped at all.  Otherwise denies any complaints today.    Past Medical History:  Diagnosis Date  . Anxiety      Past Surgical History:  Procedure Laterality Date  . NO PAST SURGERIES     Family History  Problem Relation Age of Onset  . Hypertension Mother   . Edema Mother   . Diabetes Mother   . Colon cancer Neg Hx   . Esophageal cancer Neg Hx   . Rectal cancer Neg Hx    Social History   Tobacco Use  . Smoking status: Never Smoker  . Smokeless tobacco: Never Used  Vaping Use  . Vaping Use: Never used  Substance Use Topics  . Alcohol use: Yes    Alcohol/week: 1.0 standard drink    Types: 1 Standard drinks or equivalent per week    Comment: social drinker, 1 a month  . Drug use: No   Current Outpatient Medications  Medication Sig Dispense Refill  . ALAYCEN 1/35 tablet TAKE 1 (ONE) TABLET DAILY, CONTINUOUSLY  4  . albuterol (PROVENTIL HFA;VENTOLIN HFA) 108 (90 Base) MCG/ACT inhaler Inhale 2 puffs into the lungs every 4 (four) hours as needed for wheezing. 1 Inhaler 2  . ALPRAZolam (XANAX) 0.5 MG tablet Take 1 tablet (0.5 mg total) by mouth 2 (two) times daily as needed for anxiety.  10 tablet 0  . carbamide peroxide (DEBROX) 6.5 % OTIC solution Place 5 drops into both ears 2 (two) times daily. 15 mL 0  . cetirizine (ZYRTEC) 10 MG tablet Take 1 tablet (10 mg total) by mouth daily. 90 tablet 0  . desonide (DESOWEN) 0.05 % cream Apply topically 2 (two) times daily. 30 g 1  . hydrocortisone (ANUSOL-HC) 2.5 % rectal cream Place 1 application rectally 2 (two) times daily. 30 g 0  . hydroquinone 4 % cream APPLY TO DARK SPOTS AT BEDTIME 28.35 g 0  . ibuprofen (ADVIL) 800 MG tablet Take 1 tablet (800 mg total) by mouth every 8 (eight) hours as needed. 90 tablet 3  . polyethylene glycol powder (GLYCOLAX/MIRALAX) powder Take 17 g by mouth 2 (two) times daily as needed. 578 g 1   No current facility-administered medications for this visit.   No Known Allergies   Review of Systems: All systems reviewed and negative except where noted in HPI.   Lab Results  Component Value Date   WBC 11.0 (H) 04/02/2020   HGB 13.0 04/02/2020   HCT 40.1 04/02/2020   MCV 88.1 04/02/2020   PLT 466 (H) 04/02/2020    Lab Results  Component Value Date  CREATININE 0.88 04/02/2020   BUN 10 04/02/2020   NA 137 04/02/2020   K 4.1 04/02/2020   CL 103 04/02/2020   CO2 25 04/02/2020    Lab Results  Component Value Date   ALT 17 04/02/2020   AST 17 04/02/2020   ALKPHOS 84 02/27/2019   BILITOT 0.8 04/02/2020     Physical Exam: BP 116/68   Pulse 88   Ht 5\' 2"  (1.575 m)   Wt 188 lb (85.3 kg)   BMI 34.39 kg/m  Constitutional: Pleasant,well-developed, female in no acute distress. HEENT: Normocephalic and atraumatic. Conjunctivae are normal. No scleral icterus. Neck supple.  Cardiovascular: Normal rate, regular rhythm.  Pulmonary/chest: Effort normal and breath sounds normal. No wheezing, rales or rhonchi. Abdominal: Soft, nondistended, nontender.  There are no masses palpable. No hepatomegaly. DRE - CMA Dotty Smith as standby - anterior midline anal fissure, no mass lesions, anoscopy  deferred due to obvious fissure Extremities: no edema Lymphadenopathy: No cervical adenopathy noted. Neurological: Alert and oriented to person place and time. Skin: Skin is warm and dry. No rashes noted. Psychiatric: Normal mood and affect. Behavior is normal.   ASSESSMENT AND PLAN: 39 year old female here for new patient assessment the following:  Rectal bleeding / Anal fissure -she has a clear fissure on DRE today which is the likely cause of her symptoms, interestingly she does not have much pain with this which is a bit unusual.  I discussed treatment options with her.  Recommend topical diltiazem ointment, apply pea-sized amount 3 times a day for the next 4 weeks or so.  Also recommend daily fiber supplementation.  I would like to see her back in the office in 6 weeks or so for reassessment, scheduled this for her today.  If her symptoms resolve with this intervention then we can likely monitor moving forward.  If she still has symptoms or if the fissure fails to heal then we will recommend a colonoscopy to further evaluate it and ensure nothing else going on.  She agreed with the plan, all questions answered.  Rossmoyne Cellar, MD [facility]  CC: Billie Ruddy, MD

## 2020-07-08 ENCOUNTER — Encounter: Payer: Self-pay | Admitting: Gastroenterology

## 2020-07-08 ENCOUNTER — Ambulatory Visit: Payer: Federal, State, Local not specified - PPO | Admitting: Gastroenterology

## 2020-07-08 ENCOUNTER — Other Ambulatory Visit: Payer: Self-pay | Admitting: Gastroenterology

## 2020-07-08 VITALS — BP 130/60 | HR 84 | Ht 61.0 in | Wt 195.4 lb

## 2020-07-08 DIAGNOSIS — K602 Anal fissure, unspecified: Secondary | ICD-10-CM

## 2020-07-08 DIAGNOSIS — K625 Hemorrhage of anus and rectum: Secondary | ICD-10-CM

## 2020-07-08 MED ORDER — FIBER CHOICE 1.5 G PO CHEW: CHEWABLE_TABLET | ORAL | Status: DC

## 2020-07-08 MED ORDER — SUTAB 1479-225-188 MG PO TABS: 1.0000 | ORAL_TABLET | Freq: Once | ORAL | 0 refills | Status: AC

## 2020-07-08 NOTE — Patient Instructions (Addendum)
  If you are age 38 or younger, your body mass index should be between 19-25. Your Body mass index is 36.92 kg/m. If this is out of the aformentioned range listed, please consider follow up with your Primary Care Provider.   You have been scheduled for a colonoscopy. Please follow written instructions given to you at your visit today.  Please pick up your prep supplies at the pharmacy within the next 1-3 days. If you use inhalers (even only as needed), please bring them with you on the day of your procedure.  We have given you samples of the following medication to take: Fiber Choice Chews: Take as directed   Continue diltiazem. Please let us know if you need a refill:  [phone].   Thank you for entrusting me with your care and for choosing [facility], Dr. Efland Cellar

## 2020-07-08 NOTE — Progress Notes (Signed)
HPI :  39 year old female here for a follow-up visit for history of rectal bleeding with anal fissure.  I saw her at the beginning of November with intermittent rectal bleeding.  She had a large anterior midline anal fissure at that time.  We gave her topical diltiazem ointment with lidocaine to use 3 times a day and asked her to follow-up with [place].  We also recommended a daily fiber supplement.  She states she has been using the ointment for the most part 3 times daily along with taking a daily fiber supplement.  She states her bleeding is significantly better.  She has no obvious blood in the toilet but continues to have some blood in the toilet paper at times with wiping.  Symptoms overall improved but not yet resolved.  She denies any rectal pains.  No abdominal pains.  Denies any straining for the most part with her bowel habits, that is pretty rare.  Again no family history of colon cancer or IBD.  She previously was given a trial of Anusol cream which did not help at all prior to her visit with me.   Past Medical History:  Diagnosis Date  . Anal fissure   . Anxiety      Past Surgical History:  Procedure Laterality Date  . NO PAST SURGERIES     Family History  Problem Relation Age of Onset  . Hypertension Mother   . Edema Mother   . Diabetes Mother   . Colon cancer Neg Hx   . Esophageal cancer Neg Hx   . Rectal cancer Neg Hx    Social History   Tobacco Use  . Smoking status: Never Smoker  . Smokeless tobacco: Never Used  Vaping Use  . Vaping Use: Never used  Substance Use Topics  . Alcohol use: Yes    Alcohol/week: 1.0 standard drink    Types: 1 Standard drinks or equivalent per week    Comment: social drinker, 1 a month  . Drug use: No   Current Outpatient Medications  Medication Sig Dispense Refill  . ALAYCEN 1/35 tablet TAKE 1 (ONE) TABLET DAILY, CONTINUOUSLY  4  . albuterol (PROVENTIL HFA;VENTOLIN HFA) 108 (90 Base) MCG/ACT inhaler Inhale 2 puffs into the lungs  every 4 (four) hours as needed for wheezing. 1 Inhaler 2  . ALPRAZolam (XANAX) 0.5 MG tablet Take 1 tablet (0.5 mg total) by mouth 2 (two) times daily as needed for anxiety. 10 tablet 0  . carbamide peroxide (DEBROX) 6.5 % OTIC solution Place 5 drops into both ears 2 (two) times daily. 15 mL 0  . cetirizine (ZYRTEC) 10 MG tablet Take 1 tablet (10 mg total) by mouth daily. 90 tablet 0  . desonide (DESOWEN) 0.05 % cream Apply topically 2 (two) times daily. 30 g 1  . diltiazem 2 % GEL Using your index finger, apply a small amount of medication inside the rectum up to your first knuckle/joint three times daily x 4 weeks or until healed. 30 g 0  . hydroquinone 4 % cream APPLY TO DARK SPOTS AT BEDTIME 28.35 g 0  . ibuprofen (ADVIL) 800 MG tablet Take 1 tablet (800 mg total) by mouth every 8 (eight) hours as needed. 90 tablet 3  . polyethylene glycol powder (GLYCOLAX/MIRALAX) powder Take 17 g by mouth 2 (two) times daily as needed. 578 g 1   No current facility-administered medications for this visit.   No Known Allergies   Review of Systems: All systems reviewed and negative  except where noted in HPI.   Lab Results  Component Value Date   WBC 11.0 (H) 04/02/2020   HGB 13.0 04/02/2020   HCT 40.1 04/02/2020   MCV 88.1 04/02/2020   PLT 466 (H) 04/02/2020    Lab Results  Component Value Date   CREATININE 0.88 04/02/2020   BUN 10 04/02/2020   NA 137 04/02/2020   K 4.1 04/02/2020   CL 103 04/02/2020   CO2 25 04/02/2020    Lab Results  Component Value Date   ALT 17 04/02/2020   AST 17 04/02/2020   ALKPHOS 84 02/27/2019   BILITOT 0.8 04/02/2020     Physical Exam: BP 130/60 (BP Location: Left Arm, Patient Position: Sitting, Cuff Size: Normal)   Pulse 84   Ht 5\' 1"  (1.549 m) Comment: height measured without shoes  Wt 195 lb 6 oz (88.6 kg)   BMI 36.92 kg/m  Constitutional: Pleasant,well-developed, female in no acute distress. DRE - CMA Tia Alert as standby - large anterior  midline anal fissure with some purulence noted in the area but no obvious fistula, is not improved compared to prior exam, looks the same Neurological: Alert and oriented to person place and time. Skin: Skin is warm and dry. No rashes noted. Psychiatric: Normal mood and affect. Behavior is normal.   ASSESSMENT AND PLAN: 39 year old female here for reassessment the following issues:  Rectal bleeding / anal fissure - patient had an obvious anal fissure at the last office visit thought to be the cause of her bleeding symptoms.  Treated with diltiazem ointment and fiber supplementation.  While she symptomatically has had less bleeding, symptoms have not completely resolved.  On exam today she has a large anterior midline anal fissure with some purulent drainage but no obvious fistulous tract.  It does not look improved at all compared to the last exam.  I discussed the situation with her.  I think she warrants an endoscopic evaluation at this time given persistent fissure despite appropriate therapy, ensure no evidence of Crohn's disease and exclude other rectal pathology.  We discussed whether or not she wanted to have a full colonoscopy versus flexible sigmoidoscopy.  After discussion of options and risks and benefits of each, her preference is to do full colonoscopy with ileal intubation.  She will continue fiber supplementation and diltiazem ointment 3 times daily in the interim as this has provided some benefit symptomatically.  Will await her colonoscopy with further recommendations.  If she develops pain, purulent drainage, symptoms of abscess/fistula etc., she will contact me in the interim.  She agreed with the plan, further recommendations pending result.  Shipman Cellar, MD [facility]

## 2020-07-31 DIAGNOSIS — Z20822 Contact with and (suspected) exposure to covid-19: Secondary | ICD-10-CM | POA: Diagnosis not present

## 2020-08-25 ENCOUNTER — Other Ambulatory Visit: Payer: Self-pay | Admitting: Gastroenterology

## 2020-08-25 ENCOUNTER — Ambulatory Visit (AMBULATORY_SURGERY_CENTER): Payer: Federal, State, Local not specified - PPO | Admitting: Gastroenterology

## 2020-08-25 ENCOUNTER — Other Ambulatory Visit: Payer: Self-pay

## 2020-08-25 ENCOUNTER — Encounter: Payer: Self-pay | Admitting: Gastroenterology

## 2020-08-25 VITALS — BP 130/83 | HR 81 | Temp 97.1°F | Resp 19 | Ht 61.0 in | Wt 195.0 lb

## 2020-08-25 DIAGNOSIS — K648 Other hemorrhoids: Secondary | ICD-10-CM | POA: Diagnosis not present

## 2020-08-25 DIAGNOSIS — D125 Benign neoplasm of sigmoid colon: Secondary | ICD-10-CM | POA: Diagnosis not present

## 2020-08-25 DIAGNOSIS — K602 Anal fissure, unspecified: Secondary | ICD-10-CM

## 2020-08-25 DIAGNOSIS — K625 Hemorrhage of anus and rectum: Secondary | ICD-10-CM | POA: Diagnosis not present

## 2020-08-25 MED ORDER — SODIUM CHLORIDE 0.9 % IV SOLN: 500.0000 mL | Freq: Once | INTRAVENOUS | Status: DC

## 2020-08-25 MED ORDER — AMBULATORY NON FORMULARY MEDICATION: 1 refills | Status: DC

## 2020-08-25 NOTE — Progress Notes (Signed)
Report to PACU, RN, vss, BBS= Clear.  

## 2020-08-25 NOTE — Progress Notes (Signed)
Medical history reviewed with no changes since P.V. VS assessed by J.K, RN 

## 2020-08-25 NOTE — Op Note (Signed)
[facility] Patient Name: Cheyenne [name] Procedure Date: 08/25/2020 3:11 PM MRN: [id number] Endoscopist: Viviann Spare P. Adela Lank , MD Age: 40 Referring MD:  Date of Birth: [birth year] Gender: Female Account #: [id number] Procedure:                Colonoscopy Indications:              Rectal bleeding, history of anal fissure which has                            persisted despite Diltiazem ointment Medicines:                Monitored Anesthesia Care Procedure:                Pre-Anesthesia Assessment:                           - Prior to the procedure, a History and Physical                            was performed, and patient medications and                            allergies were reviewed. The patient's tolerance of                            previous anesthesia was also reviewed. The risks                            and benefits of the procedure and the sedation                            options and risks were discussed with the patient.                            All questions were answered, and informed consent                            was obtained. Prior Anticoagulants: The patient has                            taken no previous anticoagulant or antiplatelet                            agents. ASA Grade Assessment: II - A patient with                            mild systemic disease. After reviewing the risks                            and benefits, the patient was deemed in                            satisfactory condition to undergo the procedure.  After obtaining informed consent, the colonoscope                            was passed under direct vision. Throughout the                            procedure, the patient's blood pressure, pulse, and                            oxygen saturations were monitored continuously. The                            Olympus PCF-H190DL (#[id number]) Colonoscope was                            introduced  through the anus and advanced to the the                            terminal ileum, with identification of the                            appendiceal orifice and IC valve. The colonoscopy                            was performed without difficulty. The patient                            tolerated the procedure well. The quality of the                            bowel preparation was good. The terminal ileum,                            ileocecal valve, appendiceal orifice, and rectum                            were photographed. Scope In: 3:28:00 PM Scope Out: 3:43:25 PM Scope Withdrawal Time: 0 hours 13 minutes 8 seconds  Total Procedure Duration: 0 hours 15 minutes 25 seconds  Findings:                 An anal fissure was found on perianal exam in the                            anterior midline area.                           The terminal ileum appeared normal.                           A 3 mm polyp was found in the sigmoid colon. The                            polyp was sessile. The polyp was removed with a  cold snare. Resection and retrieval were complete.                           Internal hemorrhoids were found during                            retroflexion. The hemorrhoids were small.                           The exam was otherwise without abnormality. Complications:            No immediate complications. Estimated blood loss:                            Minimal. Estimated Blood Loss:     Estimated blood loss was minimal. Impression:               - Anterior midline anal fissure found on perianal                            exam.                           - The examined portion of the ileum was normal.                           - One 3 mm polyp in the sigmoid colon, removed with                            a cold snare. Resected and retrieved.                           - Internal hemorrhoids.                           - The examination was otherwise  normal.                           No evidence of Crohn's disease. Persistent anal                            fissure is likely cause of rectal bleeding, no                            other pathology appreciated. Recommendation:           - Patient has a contact number available for                            emergencies. The signs and symptoms of potential                            delayed complications were discussed with the                            patient. Return to normal activities tomorrow.  Written discharge instructions were provided to the                            patient.                           - Resume previous diet.                           - Continue present medications.                           - Switch from diltiazem ointment to 0.125% topical                            nitroglycerin ointment PR three times daily                           - Await pathology results.                           - If symptomatic fissure persists despite topical                            nitroglycerin will consider surgical evaluation Siarra Gilkerson P. Rishith Siddoway, MD 08/25/2020 3:50:26 PM This report has been signed electronically.

## 2020-08-25 NOTE — Progress Notes (Signed)
Called to room to assist during endoscopic procedure.  Patient ID and intended procedure confirmed with present staff. Received instructions for my participation in the procedure from the performing physician.  

## 2020-08-25 NOTE — Patient Instructions (Signed)
YOU HAD AN ENDOSCOPIC PROCEDURE TODAY AT THE [facility]:   Refer to the procedure report that was given to you for any specific questions about what was found during the examination.  If the procedure report does not answer your questions, please call your gastroenterologist to clarify.  If you requested that your care partner not be given the details of your procedure findings, then the procedure report has been included in a sealed envelope for you to review at your convenience later.  YOU SHOULD EXPECT: Some feelings of bloating in the abdomen. Passage of more gas than usual.  Walking can help get rid of the air that was put into your GI tract during the procedure and reduce the bloating. If you had a lower endoscopy (such as a colonoscopy or flexible sigmoidoscopy) you may notice spotting of blood in your stool or on the toilet paper. If you underwent a bowel prep for your procedure, you may not have a normal bowel movement for a few days.  Please Note:  You might notice some irritation and congestion in your nose or some drainage.  This is from the oxygen used during your procedure.  There is no need for concern and it should clear up in a day or so.  SYMPTOMS TO REPORT IMMEDIATELY:   Following lower endoscopy (colonoscopy or flexible sigmoidoscopy):  Excessive amounts of blood in the stool  Significant tenderness or worsening of abdominal pains  Swelling of the abdomen that is new, acute  Fever of 100F or higher   Following upper endoscopy (EGD)  Vomiting of blood or coffee ground material  New chest pain or pain under the shoulder blades  Painful or persistently difficult swallowing  New shortness of breath  Fever of 100F or higher  Black, tarry-looking stools  For urgent or emergent issues, a gastroenterologist can be reached at any hour by calling [phone]. Do not use MyChart messaging for urgent concerns.    DIET:  We do recommend a small meal at first, but  then you may proceed to your regular diet.  Drink plenty of fluids but you should avoid alcoholic beverages for 24 hours.  ACTIVITY:  You should plan to take it easy for the rest of today and you should NOT DRIVE or use heavy machinery until tomorrow (because of the sedation medicines used during the test).    FOLLOW UP: Our staff will call the number listed on your records 48-72 hours following your procedure to check on you and address any questions or concerns that you may have regarding the information given to you following your procedure. If we do not reach you, we will leave a message.  We will attempt to reach you two times.  During this call, we will ask if you have developed any symptoms of COVID 19. If you develop any symptoms (ie: fever, flu-like symptoms, shortness of breath, cough etc.) before then, please call [phone].  If you test positive for Covid 19 in the 2 weeks post procedure, please call and report this information to us.    If any biopsies were taken you will be contacted by phone or by letter within the next 1-3 weeks.  Please call us at [phone] if you have not heard about the biopsies in 3 weeks.    SIGNATURES/CONFIDENTIALITY: You and/or your care partner have signed paperwork which will be entered into your electronic medical record.  These signatures attest to the fact that that the information above on   your After Visit Summary has been reviewed and is understood.  Full responsibility of the confidentiality of this discharge information lies with you and/or your care-partner. 

## 2020-08-27 ENCOUNTER — Telehealth: Payer: Self-pay | Admitting: *Deleted

## 2020-08-27 NOTE — Telephone Encounter (Signed)
  Follow up Call-  Call back number 08/25/2020  Post procedure Call Back phone  # [phone]  Permission to leave phone message Yes  Some recent data might be hidden     Patient questions:  Do you have a fever, pain , or abdominal swelling? No. Pain Score  0 *  Have you tolerated food without any problems? Yes.    Have you been able to return to your normal activities? Yes.    Do you have any questions about your discharge instructions: Diet   No. Medications  No. Follow up visit  No.  Do you have questions or concerns about your Care? Yes.     Patient feels as if she was dehydrated yesterday and was asking if it was "normal to not be peeing as much as normal". Patient instructed to increase fluid intake and to call back within 24 hours, if problem persist.   Actions: * If pain score is 4 or above: No action needed, pain <4.     1. Have you developed a fever since your procedure? no  2.   Have you had an respiratory symptoms (SOB or cough) since your procedure? no  3.   Have you tested positive for COVID 19 since your procedure no  4.   Have you had any family members/close contacts diagnosed with the COVID 19 since your procedure?  no   If yes to any of these questions please route to Joylene John, RN and Joella Prince, RN

## 2020-09-15 ENCOUNTER — Encounter: Payer: Self-pay | Admitting: Family Medicine

## 2020-10-30 ENCOUNTER — Encounter: Payer: Self-pay | Admitting: Physical Medicine and Rehabilitation

## 2020-10-30 ENCOUNTER — Other Ambulatory Visit: Payer: Self-pay | Admitting: Physical Medicine and Rehabilitation

## 2020-10-30 MED ORDER — CYCLOBENZAPRINE HCL 10 MG PO TABS: 10.0000 mg | ORAL_TABLET | Freq: Three times a day (TID) | ORAL | 0 refills | Status: DC | PRN

## 2020-10-30 NOTE — Progress Notes (Signed)
Rx for refill Flexeril

## 2020-11-10 NOTE — Telephone Encounter (Signed)
done

## 2020-11-28 DIAGNOSIS — Z01411 Encounter for gynecological examination (general) (routine) with abnormal findings: Secondary | ICD-10-CM | POA: Diagnosis not present

## 2020-11-28 DIAGNOSIS — Z124 Encounter for screening for malignant neoplasm of cervix: Secondary | ICD-10-CM | POA: Diagnosis not present

## 2020-11-28 DIAGNOSIS — Z6838 Body mass index (BMI) 38.0-38.9, adult: Secondary | ICD-10-CM | POA: Diagnosis not present

## 2020-11-28 DIAGNOSIS — Z113 Encounter for screening for infections with a predominantly sexual mode of transmission: Secondary | ICD-10-CM | POA: Diagnosis not present

## 2020-11-28 DIAGNOSIS — Z01419 Encounter for gynecological examination (general) (routine) without abnormal findings: Secondary | ICD-10-CM | POA: Diagnosis not present

## 2020-11-28 DIAGNOSIS — R7303 Prediabetes: Secondary | ICD-10-CM | POA: Diagnosis not present

## 2021-03-09 ENCOUNTER — Encounter: Payer: Self-pay | Admitting: Family Medicine

## 2021-03-12 ENCOUNTER — Encounter: Payer: Self-pay | Admitting: Family Medicine

## 2021-03-12 ENCOUNTER — Other Ambulatory Visit: Payer: Self-pay

## 2021-03-12 ENCOUNTER — Ambulatory Visit: Payer: Federal, State, Local not specified - PPO | Admitting: Family Medicine

## 2021-03-12 VITALS — BP 120/82 | HR 77 | Temp 98.3°F | Wt 201.0 lb

## 2021-03-12 DIAGNOSIS — N6012 Diffuse cystic mastopathy of left breast: Secondary | ICD-10-CM

## 2021-03-12 DIAGNOSIS — L819 Disorder of pigmentation, unspecified: Secondary | ICD-10-CM

## 2021-03-12 DIAGNOSIS — N6011 Diffuse cystic mastopathy of right breast: Secondary | ICD-10-CM

## 2021-03-12 DIAGNOSIS — R062 Wheezing: Secondary | ICD-10-CM | POA: Diagnosis not present

## 2021-03-12 DIAGNOSIS — R0602 Shortness of breath: Secondary | ICD-10-CM | POA: Diagnosis not present

## 2021-03-12 DIAGNOSIS — R059 Cough, unspecified: Secondary | ICD-10-CM | POA: Diagnosis not present

## 2021-03-12 DIAGNOSIS — R7303 Prediabetes: Secondary | ICD-10-CM | POA: Diagnosis not present

## 2021-03-12 LAB — POCT GLYCOSYLATED HEMOGLOBIN (HGB A1C): Hemoglobin A1C: 5.4 % (ref 4.0–5.6)

## 2021-03-12 MED ORDER — HYDROQUINONE 4 % EX CREA: TOPICAL_CREAM | CUTANEOUS | 0 refills | Status: DC

## 2021-03-12 MED ORDER — ALBUTEROL SULFATE HFA 108 (90 BASE) MCG/ACT IN AERS: 2.0000 | INHALATION_SPRAY | RESPIRATORY_TRACT | 5 refills | Status: AC | PRN

## 2021-03-15 ENCOUNTER — Encounter: Payer: Self-pay | Admitting: Family Medicine

## 2021-03-20 ENCOUNTER — Telehealth: Payer: Self-pay | Admitting: Family Medicine

## 2021-03-20 NOTE — Telephone Encounter (Signed)
needs clarification on an Rx hydroquinone 4 % cream to call  back at [place],

## 2021-03-20 NOTE — Telephone Encounter (Signed)
Spoke with Joellen Jersey from [facility], gave information needed for Rx.

## 2021-03-23 NOTE — Progress Notes (Signed)
Subjective:    Patient ID: Cheyenne [name], female    DOB: 09/25/1980, 40 y.o.   MRN: [id number]  Chief Complaint  Patient presents with   Breast Pain    Slight pain under left breast, comes and goes. Sometimes last 2-4 minutes, does not take anything for the pain    HPI Patient was seen today for ongoing concern. Pt endorses intermittent L breast pain, light sharp sensation at times.  Sensation has occurred in the past but most recent episode occurred several days ago over the weekend.  Patient denies nipple discharge, skin changes, nipple retraction.  No family history of early breast cancer.  Patient notes some increased stress as her wife suffered a miscarriage.  Patient inquires about refills on albuterol inhaler.  Having some shortness of breath/wheezing.  Patient also inquires about refill for hyperpigmented spots on legs.  Past Medical History:  Diagnosis Date   Anal fissure    Anxiety     No Known Allergies  ROS General: Denies fever, chills, night sweats, changes in weight, changes in appetite HEENT: Denies headaches, ear pain, changes in vision, rhinorrhea, sore throat CV: Denies CP, palpitations, SOB, orthopnea  + SOB Pulm: + SOB, wheezing, cough GI: Denies abdominal pain, nausea, vomiting, diarrhea, constipation GU: Denies dysuria, hematuria, frequency, vaginal discharge +L breast pain Msk: Denies muscle cramps, joint pains Neuro: Denies weakness, numbness, tingling Skin: Denies rashes, bruising Psych: Denies depression, anxiety, hallucinations    Objective:    Blood pressure 120/82, pulse 77, temperature 98.3 F (36.8 C), temperature source Oral, weight 201 lb (91.2 kg), SpO2 97 %.  Gen. Pleasant, well-nourished, in no distress, normal affect   HEENT: [place]/AT, face symmetric, conjunctiva clear, no scleral icterus, PERRLA, EOMI, nares patent without drainage Lungs: no accessory muscle use, CTAB, no wheezes or rales Cardiovascular: RRR, no m/r/g, no peripheral  edema GU: Normal-appearing breast bilaterally.  No peau d'orange, nipple retraction or drainage.  No masses or axillary lymphadenopathy appreciated. Musculoskeletal: No deformities, no cyanosis or clubbing, normal tone Neuro:  A&Ox3, CN II-XII intact, normal gait Skin:  Warm, no lesions/ rash   Wt Readings from Last 3 Encounters:  03/12/21 201 lb (91.2 kg)  08/25/20 195 lb (88.5 kg)  07/08/20 195 lb 6 oz (88.6 kg)    Lab Results  Component Value Date   WBC 11.0 (H) 04/02/2020   HGB 13.0 04/02/2020   HCT 40.1 04/02/2020   PLT 466 (H) 04/02/2020   GLUCOSE 93 04/02/2020   CHOL 213 (H) 04/02/2020   TRIG 144 04/02/2020   HDL 46 (L) 04/02/2020   LDLCALC 140 (H) 04/02/2020   ALT 17 04/02/2020   AST 17 04/02/2020   NA 137 04/02/2020   K 4.1 04/02/2020   CL 103 04/02/2020   CREATININE 0.88 04/02/2020   BUN 10 04/02/2020   CO2 25 04/02/2020   TSH 2.17 04/02/2020   HGBA1C 5.4 03/12/2021    Assessment/Plan:  Fibrocystic breast changes of both breasts -Discussed regular self breast exams -Given handout -For continued or worsening symptoms schedule early mammogram  Shortness of breath - Plan: albuterol (VENTOLIN HFA) 108 (90 Base) MCG/ACT inhaler  Cough -2/2 wheezing.  Likely allergic component. -Discussed using OTC antihistamines as needed - Plan: albuterol (VENTOLIN HFA) 108 (90 Base) MCG/ACT inhaler  Wheezing -Avoid triggers -Consider regular OTC antihistamine for any allergy symptoms - Plan: albuterol (VENTOLIN HFA) 108 (90 Base) MCG/ACT inhaler  Hyperpigmentation of skin  - Plan: hydroquinone 4 % cream  Prediabetes -Lifestyle modifications - Plan:  POCT glycosylated hemoglobin (Hb A1C)  F/u as needed  Grier Mitts, MD

## 2021-03-27 ENCOUNTER — Encounter: Payer: Self-pay | Admitting: Family Medicine

## 2021-05-22 ENCOUNTER — Encounter: Payer: Self-pay | Admitting: Family Medicine

## 2021-06-02 ENCOUNTER — Encounter: Payer: Self-pay | Admitting: Adult Health

## 2021-06-02 ENCOUNTER — Other Ambulatory Visit: Payer: Self-pay

## 2021-06-02 ENCOUNTER — Ambulatory Visit (INDEPENDENT_AMBULATORY_CARE_PROVIDER_SITE_OTHER): Payer: Federal, State, Local not specified - PPO | Admitting: Adult Health

## 2021-06-02 VITALS — BP 132/84 | HR 95 | Ht 62.0 in | Wt 206.0 lb

## 2021-06-02 DIAGNOSIS — F411 Generalized anxiety disorder: Secondary | ICD-10-CM

## 2021-06-02 MED ORDER — ALPRAZOLAM 0.5 MG PO TABS: ORAL_TABLET | ORAL | 2 refills | Status: DC

## 2021-06-02 NOTE — Progress Notes (Signed)
[facility] MD/PA/NP Initial Note  06/02/2021 4:48 PM [place] Cheyenne [name]  MRN:  [id number]  Chief Complaint:    HPI:   Describes mood today as "ok". Pleasant. Mood symptoms - denies depression. Feels anxious and irritable at times - usually in the work setting. Can get anxious when around a lot of people. Not sure if she's had a panic attack, but gets distressed when anxiety is high. Feels like she has anxiety every day - work related. Had been working from home for 2 and 1/2 years during Cheyenne [name]. Returned to the office in early October and is having a difficult time with managing anxiety. Has been taking Xanax 0.5mg  daily while at work. Not using while at home. Prescribed by PCP and most recently OB/GYN. Stable interest and motivation. Taking medications as prescribed.  Energy levels stable. Active, does not have a regular exercise routine. Working in the yard. Enjoys some usual interests and activities. Married 6 years. No children. No pets - 2 dogs passed away. Spending time with family. Appetite adequate. Weight gain - 206 pounds.  Sleeps well most nights. Averages 7 hours. Focus and concentration stable - "it's ok". Completing tasks. Managing aspects of household. Works full time for [employer] - 15 years. Denies SI or HI.  Denies AH or VH.  Previous medication trials:  Xanax  Visit Diagnosis:    ICD-10-CM   1. Generalized anxiety disorder  F41.1     2. Anxiety state  F41.1 ALPRAZolam (XANAX) 0.5 MG tablet    DISCONTINUED: ALPRAZolam (XANAX) 0.5 MG tablet      Past Psychiatric History: Denies psychiatric hospitalization.   Past Medical History:  Past Medical History:  Diagnosis Date   Anal fissure    Anxiety     Past Surgical History:  Procedure Laterality Date   NO PAST SURGERIES      Family Psychiatric History: Denies any family history of mental illness. Grandfather committed suicide.  Family History:  Family History  Problem Relation Age of Onset    Hypertension Mother    Edema Mother    Diabetes Mother    Colon cancer Neg Hx    Esophageal cancer Neg Hx    Rectal cancer Neg Hx     Social History:  Social History   Socioeconomic History   Marital status: Married    Spouse name: Not on file   Number of children: 0   Years of education: Not on file   Highest education level: Not on file  Occupational History   Occupation: Claims Rep  Tobacco Use   Smoking status: Never   Smokeless tobacco: Never  Vaping Use   Vaping Use: Never used  Substance and Sexual Activity   Alcohol use: Yes    Alcohol/week: 1.0 standard drink    Types: 1 Standard drinks or equivalent per week    Comment: social drinker, 1 a month   Drug use: No   Sexual activity: Yes    Partners: Female  Other Topics Concern   Not on file  Social History Narrative   Married. Education: [employer]. Exercise: No.   Social Determinants of Health   Financial Resource Strain: Not on file  Food Insecurity: Not on file  Transportation Needs: Not on file  Physical Activity: Not on file  Stress: Not on file  Social Connections: Not on file    Allergies: No Known Allergies  Metabolic Disorder Labs: Lab Results  Component Value Date   HGBA1C 5.4 03/12/2021   MPG 114 04/02/2020  No results found for: PROLACTIN Lab Results  Component Value Date   CHOL 213 (H) 04/02/2020   TRIG 144 04/02/2020   HDL 46 (L) 04/02/2020   CHOLHDL 4.6 04/02/2020   VLDL 15.2 02/27/2019   LDLCALC 140 (H) 04/02/2020   LDLCALC 141 (H) 02/27/2019   Lab Results  Component Value Date   TSH 2.17 04/02/2020   TSH 2.501 12/24/2014    Therapeutic Level Labs: No results found for: LITHIUM No results found for: VALPROATE No components found for:  CBMZ  Current Medications: Current Outpatient Medications  Medication Sig Dispense Refill   ALAYCEN 1/35 tablet TAKE 1 (ONE) TABLET DAILY, CONTINUOUSLY  4   albuterol (VENTOLIN HFA) 108 (90 Base) MCG/ACT inhaler Inhale 2 puffs into the  lungs every 4 (four) hours as needed for wheezing. 1 each 5   ALPRAZolam (XANAX) 0.5 MG tablet Take one tablet twice daily as needed for anxiety. 45 tablet 2   desonide (DESOWEN) 0.05 % cream Apply topically 2 (two) times daily. 30 g 1   hydroquinone 4 % cream APPLY TO DARK SPOTS AT BEDTIME 28.35 g 0   No current facility-administered medications for this visit.    Medication Side Effects: none  Orders placed this visit:  No orders of the defined types were placed in this encounter.   Psychiatric Specialty Exam:  Review of Systems  Musculoskeletal:  Negative for gait problem.  Neurological:  Negative for tremors.  Psychiatric/Behavioral:         Please refer to HPI   Blood pressure 132/84, pulse 95, height 5\' 2"  (1.575 m), weight 206 lb (93.4 kg).Body mass index is 37.68 kg/m.  General Appearance: Casual and Neat  Eye Contact:  Good  Speech:  Clear and Coherent and Normal Rate  Volume:  Normal  Mood:  Euthymic  Affect:  Appropriate and Congruent  Thought Process:  Coherent and Descriptions of Associations: Intact  Orientation:  Full (Time, Place, and Person)  Thought Content: Logical   Suicidal Thoughts:  No  Homicidal Thoughts:  No  Memory:  WNL  Judgement:  Good  Insight:  Good  Psychomotor Activity:  Normal  Concentration:  Concentration: Good  Recall:  Negative  Fund of Knowledge: Good  Language: Good  Assets:  Communication Skills Desire for Improvement Financial Resources/Insurance Housing Intimacy Leisure Time Physical Health Resilience Social Support Talents/Skills Transportation Vocational/Educational  ADL's:  Intact  Cognition: WNL  Prognosis:  Good   Screenings:  PHQ2-9    [place] Office Visit from 10/19/2018 in Primary [facility] at [facility] from 09/01/2015 in Primary Care at [facility] from 04/22/2015 in Primary Care at [facility] from 12/24/2014 in Primary Care at [facility] [place]  PHQ-2 Total Score 0 0 0 0      Receiving  Psychotherapy: No   Treatment Plan/Recommendations:   Plan:  PDMP reviewed  Xanax 0.5mg  daily for anxiety  Patient seen for 30 minutes and time spent discussing treatment options.  RTC 4 weeks  Patient advised to contact office with any questions, adverse effects, or acute worsening in signs and symptoms.  Discussed potential benefits, risk, and side effects of benzodiazepines to include potential risk of tolerance and dependence, as well as possible drowsiness.  Advised patient not to drive if experiencing drowsiness and to take lowest possible effective dose to minimize risk of dependence and tolerance.       Aloha Gell, NP

## 2021-06-05 ENCOUNTER — Other Ambulatory Visit: Payer: Self-pay | Admitting: Family Medicine

## 2021-06-05 DIAGNOSIS — Z1231 Encounter for screening mammogram for malignant neoplasm of breast: Secondary | ICD-10-CM

## 2021-07-02 ENCOUNTER — Other Ambulatory Visit: Payer: Self-pay | Admitting: Family Medicine

## 2021-07-02 DIAGNOSIS — N6011 Diffuse cystic mastopathy of right breast: Secondary | ICD-10-CM

## 2021-07-02 DIAGNOSIS — N644 Mastodynia: Secondary | ICD-10-CM

## 2021-07-06 ENCOUNTER — Other Ambulatory Visit: Payer: Self-pay | Admitting: Family Medicine

## 2021-07-06 DIAGNOSIS — N644 Mastodynia: Secondary | ICD-10-CM

## 2021-07-14 ENCOUNTER — Ambulatory Visit: Payer: Federal, State, Local not specified - PPO

## 2021-08-07 ENCOUNTER — Other Ambulatory Visit: Payer: Self-pay

## 2021-08-07 ENCOUNTER — Ambulatory Visit
Admission: RE | Admit: 2021-08-07 | Discharge: 2021-08-07 | Disposition: A | Discharge status: Home / Self Care | Payer: Federal, State, Local not specified - PPO | Source: Ambulatory Visit | Attending: Family Medicine | Admitting: Family Medicine

## 2021-08-07 ENCOUNTER — Telehealth: Payer: Self-pay | Admitting: Family Medicine

## 2021-08-07 ENCOUNTER — Other Ambulatory Visit: Payer: Self-pay | Admitting: Family Medicine

## 2021-08-07 DIAGNOSIS — N6324 Unspecified lump in the left breast, lower inner quadrant: Secondary | ICD-10-CM | POA: Diagnosis not present

## 2021-08-07 DIAGNOSIS — N6320 Unspecified lump in the left breast, unspecified quadrant: Secondary | ICD-10-CM

## 2021-08-07 DIAGNOSIS — R922 Inconclusive mammogram: Secondary | ICD-10-CM | POA: Diagnosis not present

## 2021-08-07 DIAGNOSIS — R599 Enlarged lymph nodes, unspecified: Secondary | ICD-10-CM

## 2021-08-07 DIAGNOSIS — R590 Localized enlarged lymph nodes: Secondary | ICD-10-CM | POA: Diagnosis not present

## 2021-08-07 DIAGNOSIS — C50312 Malignant neoplasm of lower-inner quadrant of left female breast: Secondary | ICD-10-CM | POA: Diagnosis not present

## 2021-08-07 DIAGNOSIS — Z170 Estrogen receptor positive status [ER+]: Secondary | ICD-10-CM | POA: Diagnosis not present

## 2021-08-07 DIAGNOSIS — N644 Mastodynia: Secondary | ICD-10-CM

## 2021-08-07 DIAGNOSIS — N6325 Unspecified lump in the left breast, overlapping quadrants: Secondary | ICD-10-CM | POA: Diagnosis not present

## 2021-08-07 DIAGNOSIS — N6011 Diffuse cystic mastopathy of right breast: Secondary | ICD-10-CM

## 2021-08-07 NOTE — Telephone Encounter (Signed)
Patient called by this provider to check on her status post L breast biopsy and L axillary node biopsy today.  Patient states she is doing okay.  Inquires about frequency of icing areas biopsied.  Also mentions Steri-Strip in left axilla appears to be coming off.  Advised okay/let Steri-Strips come off on their own.  Grier Mitts, MD

## 2021-08-11 ENCOUNTER — Telehealth: Payer: Self-pay | Admitting: Hematology

## 2021-08-11 NOTE — Telephone Encounter (Signed)
Spoke to patient to confirm morning clinic appointment for 1/18, packet sent via e-mail

## 2021-08-17 ENCOUNTER — Encounter: Payer: Self-pay | Admitting: *Deleted

## 2021-08-17 DIAGNOSIS — C50312 Malignant neoplasm of lower-inner quadrant of left female breast: Secondary | ICD-10-CM

## 2021-08-17 DIAGNOSIS — Z170 Estrogen receptor positive status [ER+]: Secondary | ICD-10-CM

## 2021-08-18 NOTE — Progress Notes (Signed)
Radiation Oncology         [phone] ________________________________  Multidisciplinary Breast Oncology Clinic [facility]) Initial Outpatient Consultation  Name: Cheyenne [name] MRN: [id number]  Date: 08/19/2021  DOB: Feb 23, 1981  [id number], Bettey Mare, MD  Harriette Bouillon, MD   REFERRING PHYSICIAN: Harriette Bouillon, MD  DIAGNOSIS: The encounter diagnosis was Malignant neoplasm of lower-inner quadrant of left breast in female, estrogen receptor positive (HCC).  Stage IIIA (cT2, cN1, cM0) Left Breast LIQ, Invasive Ductal Carcinoma, ER+ / PR- / Her2-, Grade 3, Functionally triple negative, Grade 3    ICD-10-CM   1. Malignant neoplasm of lower-inner quadrant of left breast in female, estrogen receptor positive (HCC)  C50.312    Z17.0       HISTORY OF PRESENT ILLNESS::Cheyenne [name] is a 42 y.o. female who is presenting to the office today for evaluation of her newly diagnosed breast cancer. She is accompanied by her wife. She is doing well overall.   The patient presented with a palpable mass in the left breast since August of 2022. Subsequently, she underwent bilateral diagnostic mammography with tomography and left breast ultrasonography at [facility] on 08/07/21 showing: a suspicious mass in the 7:30 o'clock position of the left breast. Imaging also showed left axillary adenopathy, with at least 4 abnormal lymph nodes.  Left breast biopsy at the 7:30 o'clock position on 08/07/21 showed: grade 3 invasive ductal carcinoma measuring 1.1 cm in the greatest linear extent. Left axillary lymph node biopsy showed no evidence of carcinoma. Prognostic indicators significant for: estrogen receptor, 2% positive and progesterone receptor, <1% negative. Proliferation marker Ki67 at 40%. HER2 negative.  Menarche: 41 years old LMP: date not indicated, no longer having periods Contraceptive: on the provided form she indicated "I take birth control pills continuously" HRT: not indicated on  the provided form   The patient was referred today for presentation in the multidisciplinary conference.  Radiology studies and pathology slides were presented there for review and discussion of treatment options.  A consensus was discussed regarding potential next steps.  PREVIOUS RADIATION THERAPY: No  PAST MEDICAL HISTORY:  Past Medical History:  Diagnosis Date   Anal fissure    Anxiety    Breast cancer (HCC) 08/10/21   Date diagnosis was given    PAST SURGICAL HISTORY: Past Surgical History:  Procedure Laterality Date   NO PAST SURGERIES      FAMILY HISTORY:  Family History  Problem Relation Age of Onset   Hypertension Mother    Edema Mother    Diabetes Mother    Obesity Mother    Diabetes Sister    Obesity Sister    Cancer Cousin 88       breast cancer   Cancer Cousin 33       breast cancer   Colon cancer Neg Hx    Esophageal cancer Neg Hx    Rectal cancer Neg Hx     SOCIAL HISTORY:  Social History   Socioeconomic History   Marital status: Married    Spouse name: Not on file   Number of children: 0   Years of education: Not on file   Highest education level: Not on file  Occupational History   Occupation: Claims Rep  Tobacco Use   Smoking status: Never   Smokeless tobacco: Never  Vaping Use   Vaping Use: Never used  Substance and Sexual Activity   Alcohol use: Yes    Comment: Occasionally   Drug use: No  Sexual activity: Yes    Partners: Female  Other Topics Concern   Not on file  Social History Narrative   Married. Education: [employer]. Exercise: No.   Social Determinants of Health   Financial Resource Strain: Not on file  Food Insecurity: Not on file  Transportation Needs: Not on file  Physical Activity: Not on file  Stress: Not on file  Social Connections: Not on file    ALLERGIES: No Known Allergies  MEDICATIONS:  Current Outpatient Medications  Medication Sig Dispense Refill   ALAYCEN 1/35 tablet TAKE 1 (ONE) TABLET DAILY,  CONTINUOUSLY  4   albuterol (VENTOLIN HFA) 108 (90 Base) MCG/ACT inhaler Inhale 2 puffs into the lungs every 4 (four) hours as needed for wheezing. 1 each 5   ALPRAZolam (XANAX) 0.5 MG tablet Take one tablet twice daily as needed for anxiety. 45 tablet 2   desonide (DESOWEN) 0.05 % cream Apply topically 2 (two) times daily. 30 g 1   hydroquinone 4 % cream APPLY TO DARK SPOTS AT BEDTIME 28.35 g 0   ibuprofen (ADVIL) 800 MG tablet Take 800 mg by mouth every 8 (eight) hours as needed for moderate pain.     Multiple Vitamins-Minerals (ONE A DAY IMMUNITY DEFENSE) CHEW Chew 1 tablet by mouth daily.     No current facility-administered medications for this encounter.    REVIEW OF SYSTEMS: A 10+ POINT REVIEW OF SYSTEMS WAS OBTAINED including neurology, dermatology, psychiatry, cardiac, respiratory, lymph, extremities, GI, GU, musculoskeletal, constitutional, reproductive, HEENT. On the provided form, she reports stabbing left breast and back pain, wearing glasses, blurred vision, sinus problems, nose bleeding, rectal bleeding, palpable left breast lump, and anxiety . She denies any other symptoms.    PHYSICAL EXAM:    Vitals with BMI 08/19/2021  Height 5\' 2"   Weight 205 lbs 10 oz  BMI 37.6  Systolic 141  Diastolic 74  Pulse 90    Lungs are clear to auscultation bilaterally. Heart has regular rate and rhythm. No palpable cervical, supraclavicular, or axillary adenopathy. Abdomen soft, non-tender, normal bowel sounds. Breast: Right breast is large and pendulous with no palpable mass, nipple discharge, or bleeding. Left breast with a palpable mass in the lower inner quadrant, measuring approximately 2 cm in size. No nipple discharge or bleeding. No other palpable areas or axillary lymph nodes appreciated.   KPS = 90  100 - Normal; no complaints; no evidence of disease. 90   - Able to carry on normal activity; minor signs or symptoms of disease. 80   - Normal activity with effort; some signs or  symptoms of disease. 50   - Cares for self; unable to carry on normal activity or to do active work. 60   - Requires occasional assistance, but is able to care for most of his personal needs. 50   - Requires considerable assistance and frequent medical care. 40   - Disabled; requires special care and assistance. 30   - Severely disabled; hospital admission is indicated although death not imminent. 20   - Very sick; hospital admission necessary; active supportive treatment necessary. 10   - Moribund; fatal processes progressing rapidly. 0     - Dead  Karnofsky DA, Abelmann WH, Craver LS and Burchenal [facility] [phone]) The use of the nitrogen mustards in the palliative treatment of carcinoma: with particular reference to bronchogenic carcinoma Cancer 1 [id number]  LABORATORY DATA:  Lab Results  Component Value Date   WBC 12.2 (H) 08/19/2021   HGB 13.3 08/19/2021  HCT 39.2 08/19/2021   MCV 86.3 08/19/2021   PLT 418 (H) 08/19/2021   Lab Results  Component Value Date   NA 137 08/19/2021   K 3.5 08/19/2021   CL 105 08/19/2021   CO2 24 08/19/2021   Lab Results  Component Value Date   ALT 12 08/19/2021   AST 12 (L) 08/19/2021   ALKPHOS 51 08/19/2021   BILITOT 0.4 08/19/2021    PULMONARY FUNCTION TEST:   Recent Review Flowsheet Data   There is no flowsheet data to display.     RADIOGRAPHY: US BREAST LTD UNI LEFT INC AXILLA  Result Date: 08/07/2021 CLINICAL DATA:  Palpable mass in the LEFT breast since August. EXAM: DIGITAL DIAGNOSTIC BILATERAL MAMMOGRAM WITH TOMOSYNTHESIS AND CAD; ULTRASOUND LEFT BREAST LIMITED TECHNIQUE: Bilateral digital diagnostic mammography and breast tomosynthesis was performed. The images were evaluated with computer-aided detection.; Targeted ultrasound examination of the left breast was performed. COMPARISON:  04/16/2014 ACR Breast Density Category b: There are scattered areas of fibroglandular density. FINDINGS: RIGHT breast is negative. An irregular mass in the  LOWER INNER QUADRANT of the LEFT breast is marked with a BB as palpable. No associated microcalcifications. On physical exam, I palpate a discrete superficial mobile firm mass in the 7:30 o'clock location of the LEFT breast. Targeted ultrasound is performed, showing an irregular mass with spiculated margins in the 7:30 o'clock location the LEFT breast 7 centimeters from the nipple. Mass shows no internal blood flow on Doppler evaluation. No significant posterior acoustic features. Mass measures 2.8 x 1.7 x 1.6 centimeters. Evaluation of the LEFT axilla shows at least 4 lymph nodes with abnormal morphology. Other lymph nodes with normal morphology are also present, in the mid and UPPER LEFT axilla. IMPRESSION: 1. Suspicious mass in the 7:30 o'clock location of the LEFT breast for which biopsy is recommended. 2. LEFT axillary adenopathy, with at least 4 abnormal lymph nodes. RECOMMENDATION: Recommend ultrasound-guided core biopsy of LEFT breast mass and LEFT axilla. I have discussed the findings and recommendations with the patient. If applicable, a reminder letter will be sent to the patient regarding the next appointment. BI-RADS CATEGORY  5: Highly suggestive of malignancy. Electronically Signed   By: Norva Pavlov M.D.   On: 08/07/2021 09:59  MM DIAG BREAST TOMO BILATERAL  Result Date: 08/07/2021 CLINICAL DATA:  Palpable mass in the LEFT breast since August. EXAM: DIGITAL DIAGNOSTIC BILATERAL MAMMOGRAM WITH TOMOSYNTHESIS AND CAD; ULTRASOUND LEFT BREAST LIMITED TECHNIQUE: Bilateral digital diagnostic mammography and breast tomosynthesis was performed. The images were evaluated with computer-aided detection.; Targeted ultrasound examination of the left breast was performed. COMPARISON:  04/16/2014 ACR Breast Density Category b: There are scattered areas of fibroglandular density. FINDINGS: RIGHT breast is negative. An irregular mass in the LOWER INNER QUADRANT of the LEFT breast is marked with a BB as  palpable. No associated microcalcifications. On physical exam, I palpate a discrete superficial mobile firm mass in the 7:30 o'clock location of the LEFT breast. Targeted ultrasound is performed, showing an irregular mass with spiculated margins in the 7:30 o'clock location the LEFT breast 7 centimeters from the nipple. Mass shows no internal blood flow on Doppler evaluation. No significant posterior acoustic features. Mass measures 2.8 x 1.7 x 1.6 centimeters. Evaluation of the LEFT axilla shows at least 4 lymph nodes with abnormal morphology. Other lymph nodes with normal morphology are also present, in the mid and UPPER LEFT axilla. IMPRESSION: 1. Suspicious mass in the 7:30 o'clock location of the LEFT breast for which biopsy  is recommended. 2. LEFT axillary adenopathy, with at least 4 abnormal lymph nodes. RECOMMENDATION: Recommend ultrasound-guided core biopsy of LEFT breast mass and LEFT axilla. I have discussed the findings and recommendations with the patient. If applicable, a reminder letter will be sent to the patient regarding the next appointment. BI-RADS CATEGORY  5: Highly suggestive of malignancy. Electronically Signed   By: Norva Pavlov M.D.   On: 08/07/2021 09:59  [place] AXILLARY NODE CORE BIOPSY LEFT  Addendum Date: 08/10/2021   ADDENDUM REPORT: 08/10/2021 13:41 ADDENDUM: Pathology revealed GRADE III INVASIVE DUCTAL CARCINOMA of the LEFT breast, 7:30 o'clock, (ribbon clip). This was found to be concordant by Dr. Emmaline Kluver. Pathology revealed NO CARCINOMA IDENTIFIED of the LEFT axillary lymph node, (coil clip). This was found to be concordant by Dr. Emmaline Kluver. Pathology results were discussed with the patient by telephone. The patient reported doing well after the biopsies with tenderness at the sites. Post biopsy instructions and care were reviewed and questions were answered. The patient was encouraged to call [facility] for any additional concerns.  My direct phone number was provided. The patient was referred to [facility] at [facility] on August 19, 2021. Pathology results reported by Rene Kocher, RN on 08/10/2021. Electronically Signed   By: Emmaline Kluver M.D.   On: 08/10/2021 13:41   Result Date: 08/10/2021 CLINICAL DATA:  41 year old female presenting for biopsy of a left breast mass and left axillary lymph node. EXAM: ULTRASOUND GUIDED LEFT BREAST CORE NEEDLE BIOPSY ULTRASOUND GUIDED LEFT AXILLARY BIOPSY COMPARISON:  Previous exam(s). PROCEDURE: I met with the patient and we discussed the procedure of ultrasound-guided biopsy, including benefits and alternatives. We discussed the high likelihood of a successful procedure. We discussed the risks of the procedure, including infection, bleeding, tissue injury, clip migration, and inadequate sampling. Informed written consent was given. The usual time-out protocol was performed immediately prior to the procedure. 1.  Lesion quadrant: Lower inner quadrant Using sterile technique and 1% Lidocaine as local anesthetic, under direct ultrasound visualization, a 14 gauge spring-loaded device was used to perform biopsy of a mass in the left breast at 7:30 o'clock using a inferior approach. At the conclusion of the procedure a ribbon tissue marker clip was deployed into the biopsy cavity. Follow up 2 view mammogram was performed and dictated separately. 2.  Left axilla Using sterile technique and 1% Lidocaine as local anesthetic, under direct ultrasound visualization, a 14 gauge spring-loaded device was used to perform biopsy of a left axillary lymph node using a lateral approach. At the conclusion of the procedure a coil tissue marker clip was deployed into the biopsy cavity. Follow up 2 view mammogram was performed and dictated separately. IMPRESSION: Ultrasound guided biopsy of a mass in the left breast at 7:30 o'clock and of a left axillary lymph  node. No apparent complications. Electronically Signed: By: Emmaline Kluver M.D. On: 08/07/2021 11:03  MM CLIP PLACEMENT LEFT  Result Date: 08/07/2021 CLINICAL DATA:  Post procedure mammogram for clip placement. EXAM: 3D DIAGNOSTIC LEFT MAMMOGRAM POST ULTRASOUND BIOPSY COMPARISON:  Previous exam(s). FINDINGS: 3D Mammographic images were obtained following ultrasound guided biopsy of a mass in the left breast at 7:30 o'clock. The ribbon biopsy marking clip is in expected position at the site of biopsy. 3D Mammographic images were obtained following ultrasound guided biopsy of a left axillary lymph node. The coil biopsy marking clip is in expected position at the site of  biopsy. IMPRESSION: Appropriate positioning of the ribbon shaped biopsy marking clip at the site of biopsy in the left breast at 7:30 o'clock. Appropriate positioning of the coil shaped biopsy marking clip at the site of biopsy in the left axilla. Final Assessment: Post Procedure Mammograms for Marker Placement Electronically Signed   By: Emmaline Kluver M.D.   On: 08/07/2021 10:53  [place] LT BREAST BX W LOC DEV 1ST LESION IMG BX SPEC US GUIDE  Addendum Date: 08/10/2021   ADDENDUM REPORT: 08/10/2021 13:41 ADDENDUM: Pathology revealed GRADE III INVASIVE DUCTAL CARCINOMA of the LEFT breast, 7:30 o'clock, (ribbon clip). This was found to be concordant by Dr. Emmaline Kluver. Pathology revealed NO CARCINOMA IDENTIFIED of the LEFT axillary lymph node, (coil clip). This was found to be concordant by Dr. Emmaline Kluver. Pathology results were discussed with the patient by telephone. The patient reported doing well after the biopsies with tenderness at the sites. Post biopsy instructions and care were reviewed and questions were answered. The patient was encouraged to call [facility] for any additional concerns. My direct phone number was provided. The patient was referred to [facility] at [facility] on August 19, 2021. Pathology results reported by Rene Kocher, RN on 08/10/2021. Electronically Signed   By: Emmaline Kluver M.D.   On: 08/10/2021 13:41   Result Date: 08/10/2021 CLINICAL DATA:  41 year old female presenting for biopsy of a left breast mass and left axillary lymph node. EXAM: ULTRASOUND GUIDED LEFT BREAST CORE NEEDLE BIOPSY ULTRASOUND GUIDED LEFT AXILLARY BIOPSY COMPARISON:  Previous exam(s). PROCEDURE: I met with the patient and we discussed the procedure of ultrasound-guided biopsy, including benefits and alternatives. We discussed the high likelihood of a successful procedure. We discussed the risks of the procedure, including infection, bleeding, tissue injury, clip migration, and inadequate sampling. Informed written consent was given. The usual time-out protocol was performed immediately prior to the procedure. 1.  Lesion quadrant: Lower inner quadrant Using sterile technique and 1% Lidocaine as local anesthetic, under direct ultrasound visualization, a 14 gauge spring-loaded device was used to perform biopsy of a mass in the left breast at 7:30 o'clock using a inferior approach. At the conclusion of the procedure a ribbon tissue marker clip was deployed into the biopsy cavity. Follow up 2 view mammogram was performed and dictated separately. 2.  Left axilla Using sterile technique and 1% Lidocaine as local anesthetic, under direct ultrasound visualization, a 14 gauge spring-loaded device was used to perform biopsy of a left axillary lymph node using a lateral approach. At the conclusion of the procedure a coil tissue marker clip was deployed into the biopsy cavity. Follow up 2 view mammogram was performed and dictated separately. IMPRESSION: Ultrasound guided biopsy of a mass in the left breast at 7:30 o'clock and of a left axillary lymph node. No apparent complications. Electronically Signed: By: Emmaline Kluver M.D. On: 08/07/2021 11:03      IMPRESSION: Stage IIIA (cT2, cN1, cM0) Left Breast LIQ, Invasive Ductal Carcinoma, ER+ / PR- / Her2-, Grade 3,  Functionally triple negative, Grade 3  Patient will be a good candidate for breast conservation with radiotherapy to the left breast. We discussed the general course of radiation, potential side effects, and toxicities with radiation and the patient is interested in this approach.    PLAN:  Genetics  MRI and PET Neoadjuvant chemo/port Left lumpectomy (and ? targeted axillary node dissection pending suspicious lymph node evaluation) Adjuvant  radiation  Aromatase Inhibitor    ------------------------------------------------  Billie Lade, PhD, MD  This document serves as a record of services personally performed by Antony Blackbird, MD. It was created on his behalf by Neena Rhymes, a trained medical scribe. The creation of this record is based on the scribe's personal observations and the provider's statements to them. This document has been checked and approved by the attending provider.

## 2021-08-19 ENCOUNTER — Inpatient Hospital Stay: Payer: Federal, State, Local not specified - PPO | Admitting: Licensed Clinical Social Worker

## 2021-08-19 ENCOUNTER — Ambulatory Visit: Payer: Self-pay | Admitting: Surgery

## 2021-08-19 ENCOUNTER — Inpatient Hospital Stay: Payer: Federal, State, Local not specified - PPO | Attending: Hematology

## 2021-08-19 ENCOUNTER — Encounter: Payer: Self-pay | Admitting: Physical Therapy

## 2021-08-19 ENCOUNTER — Encounter: Payer: Self-pay | Admitting: *Deleted

## 2021-08-19 ENCOUNTER — Ambulatory Visit: Payer: Federal, State, Local not specified - PPO | Attending: Surgery | Admitting: Physical Therapy

## 2021-08-19 ENCOUNTER — Other Ambulatory Visit: Payer: Self-pay

## 2021-08-19 ENCOUNTER — Ambulatory Visit (HOSPITAL_BASED_OUTPATIENT_CLINIC_OR_DEPARTMENT_OTHER): Payer: Federal, State, Local not specified - PPO | Admitting: Genetic Counselor

## 2021-08-19 ENCOUNTER — Encounter: Payer: Self-pay | Admitting: Hematology

## 2021-08-19 ENCOUNTER — Other Ambulatory Visit: Payer: Self-pay | Admitting: *Deleted

## 2021-08-19 ENCOUNTER — Inpatient Hospital Stay (HOSPITAL_BASED_OUTPATIENT_CLINIC_OR_DEPARTMENT_OTHER): Payer: Federal, State, Local not specified - PPO | Admitting: Hematology

## 2021-08-19 ENCOUNTER — Ambulatory Visit
Admission: RE | Admit: 2021-08-19 | Discharge: 2021-08-19 | Disposition: A | Discharge status: Home / Self Care | Payer: Federal, State, Local not specified - PPO | Source: Ambulatory Visit | Attending: Radiation Oncology | Admitting: Radiation Oncology

## 2021-08-19 VITALS — BP 141/74 | HR 90 | Temp 99.2°F | Resp 18 | Ht 62.0 in | Wt 205.6 lb

## 2021-08-19 DIAGNOSIS — C50312 Malignant neoplasm of lower-inner quadrant of left female breast: Secondary | ICD-10-CM | POA: Diagnosis not present

## 2021-08-19 DIAGNOSIS — Z170 Estrogen receptor positive status [ER+]: Secondary | ICD-10-CM

## 2021-08-19 DIAGNOSIS — Z803 Family history of malignant neoplasm of breast: Secondary | ICD-10-CM | POA: Insufficient documentation

## 2021-08-19 DIAGNOSIS — N644 Mastodynia: Secondary | ICD-10-CM

## 2021-08-19 DIAGNOSIS — F419 Anxiety disorder, unspecified: Secondary | ICD-10-CM

## 2021-08-19 DIAGNOSIS — Z171 Estrogen receptor negative status [ER-]: Secondary | ICD-10-CM | POA: Insufficient documentation

## 2021-08-19 DIAGNOSIS — C50912 Malignant neoplasm of unspecified site of left female breast: Secondary | ICD-10-CM | POA: Diagnosis not present

## 2021-08-19 DIAGNOSIS — R293 Abnormal posture: Secondary | ICD-10-CM | POA: Insufficient documentation

## 2021-08-19 LAB — CBC WITH DIFFERENTIAL (CANCER CENTER ONLY)
Abs Immature Granulocytes: 0.04 10*3/uL (ref 0.00–0.07)
Basophils Absolute: 0.1 10*3/uL (ref 0.0–0.1)
Basophils Relative: 1 %
Eosinophils Absolute: 0.3 10*3/uL (ref 0.0–0.5)
Eosinophils Relative: 3 %
HCT: 39.2 % (ref 36.0–46.0)
Hemoglobin: 13.3 g/dL (ref 12.0–15.0)
Immature Granulocytes: 0 %
Lymphocytes Relative: 31 %
Lymphs Abs: 3.8 10*3/uL (ref 0.7–4.0)
MCH: 29.3 pg (ref 26.0–34.0)
MCHC: 33.9 g/dL (ref 30.0–36.0)
MCV: 86.3 fL (ref 80.0–100.0)
Monocytes Absolute: 0.6 10*3/uL (ref 0.1–1.0)
Monocytes Relative: 5 %
Neutro Abs: 7.3 10*3/uL (ref 1.7–7.7)
Neutrophils Relative %: 60 %
Platelet Count: 418 10*3/uL — ABNORMAL HIGH (ref 150–400)
RBC: 4.54 MIL/uL (ref 3.87–5.11)
RDW: 13 % (ref 11.5–15.5)
WBC Count: 12.2 10*3/uL — ABNORMAL HIGH (ref 4.0–10.5)
nRBC: 0 % (ref 0.0–0.2)

## 2021-08-19 LAB — CMP (CANCER CENTER ONLY)
ALT: 12 U/L (ref 0–44)
AST: 12 U/L — ABNORMAL LOW (ref 15–41)
Albumin: 3.9 g/dL (ref 3.5–5.0)
Alkaline Phosphatase: 51 U/L (ref 38–126)
Anion gap: 8 (ref 5–15)
BUN: 13 mg/dL (ref 6–20)
CO2: 24 mmol/L (ref 22–32)
Calcium: 8.9 mg/dL (ref 8.9–10.3)
Chloride: 105 mmol/L (ref 98–111)
Creatinine: 0.9 mg/dL (ref 0.44–1.00)
GFR, Estimated: >60 mL/min (ref >60)
Glucose, Bld: 146 mg/dL — ABNORMAL HIGH (ref 70–99)
Potassium: 3.5 mmol/L (ref 3.5–5.1)
Sodium: 137 mmol/L (ref 135–145)
Total Bilirubin: 0.4 mg/dL (ref 0.3–1.2)
Total Protein: 7.2 g/dL (ref 6.5–8.1)

## 2021-08-19 LAB — GENETIC SCREENING ORDER

## 2021-08-19 MED ORDER — LIDOCAINE-PRILOCAINE 2.5-2.5 % EX CREA: TOPICAL_CREAM | CUTANEOUS | 3 refills | Status: DC

## 2021-08-19 MED ORDER — PROCHLORPERAZINE MALEATE 10 MG PO TABS: 10.0000 mg | ORAL_TABLET | Freq: Four times a day (QID) | ORAL | 1 refills | Status: DC | PRN

## 2021-08-19 MED ORDER — ONDANSETRON HCL 8 MG PO TABS: 8.0000 mg | ORAL_TABLET | Freq: Two times a day (BID) | ORAL | 1 refills | Status: DC | PRN

## 2021-08-19 NOTE — Progress Notes (Signed)
START ON PATHWAY REGIMEN - Breast     Cycles 1 through 4: A cycle is every 21 days:     Pembrolizumab      Paclitaxel      Carboplatin      Filgrastim-xxxx    Cycles 5 through 8: A cycle is every 21 days:     Pembrolizumab      Doxorubicin      Cyclophosphamide      Pegfilgrastim-xxxx   **Always confirm dose/schedule in your pharmacy ordering system**  Patient Characteristics: Preoperative or Nonsurgical Candidate (Clinical Staging), Neoadjuvant Therapy followed by Surgery, Invasive Disease, Chemotherapy, HER2 Negative/Unknown/Equivocal, ER Negative/Unknown, Platinum Therapy Indicated and Candidate for Checkpoint Inhibitor Therapeutic Status: Preoperative or Nonsurgical Candidate (Clinical Staging) AJCC M Category: cM0 AJCC Grade: G3 Breast Surgical Plan: Neoadjuvant Therapy followed by Surgery ER Status: Negative (-) AJCC 8 Stage Grouping: IIIB HER2 Status: Negative (-) AJCC T Category: cT2 AJCC N Category: cN1 PR Status: Negative (-) Intent of Therapy: Curative Intent, Discussed with Patient 

## 2021-08-19 NOTE — Therapy (Signed)
OUTPATIENT PHYSICAL THERAPY BREAST CANCER BASELINE EVALUATION   Patient Name: Cheyenne [name] MRN: [id number] DOB:05-16-1981, 41 y.o., female Today's Date: 08/19/2021   PT End of Session - 08/19/21 1136     Visit Number 1    Number of Visits 2    Date for PT Re-Evaluation 04/19/22    PT Start Time 1057    PT Stop Time 1124    PT Time Calculation (min) 27 min    Activity Tolerance Patient tolerated treatment well    Behavior During Therapy [facility] for tasks assessed/performed             Past Medical History:  Diagnosis Date   Anal fissure    Anxiety    Breast cancer ([place]) 08/10/21   Date diagnosis was given   Past Surgical History:  Procedure Laterality Date   NO PAST SURGERIES     Patient Active Problem List   Diagnosis Date Noted   Malignant neoplasm of lower-inner quadrant of left breast in female, estrogen receptor positive ([place]) 08/17/2021   Anxiety 08/06/2019   Plantar fasciitis 08/06/2019   Fluid collection (edema) in the arms, legs, hands and feet 05/02/2018    PCP: Billie Ruddy, MD  REFERRING PROVIDER: Erroll Luna, MD  REFERRING DIAG: Left breast cancer  THERAPY DIAG:  Malignant neoplasm of lower-inner quadrant of left breast in female, estrogen receptor positive ([place])  Abnormal posture  ONSET DATE: 08/07/2021  SUBJECTIVE                                                                                                                                                                                           SUBJECTIVE STATEMENT: Patient reports she is here today to be seen by her medical team for her newly diagnosed left breast cancer.   PERTINENT HISTORY:  Patient was diagnosed on 08/07/2021 with left grade III invasive ductal carcinoma breast cancer. It measures 2.8 cm and is located in the lower inner quadrant. It is weakly (2%) ER positive, PR and HER2 negative with a Ki67 of 40%.   PATIENT GOALS   reduce lymphedema risk and learn post op  HEP.   PAIN:  Are you having pain? Yes NPRS scale: 3/10 Pain location: left chest and scapula Pain orientation: Left  PAIN TYPE: pressure Pain description: constant  Aggravating factors: nothing Relieving factors: nothing  PRECAUTIONS: Active CA  WEIGHT BEARING RESTRICTIONS No  FALLS:  Has patient fallen in last 6 months? 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[employer], [employer]  LEISURE: She stays active but does not exercise  PRIOR LEVEL OF FUNCTION: Independent   OBJECTIVE  COGNITION:  Overall cognitive status: Within functional limits for tasks assessed    POSTURE:  Forward head and rounded shoulders posture  UPPER EXTREMITY AROM/PROM:  A/PROM Right 08/19/2021 Left 08/19/2021  Shoulder extension 40 22  Shoulder flexion 146 146  Shoulder abduction 153 151  Shoulder internal rotation 57 52  Shoulder external rotation 90 90    (Blank rows = not tested)    CERVICAL AROM: All within normal limits  UPPER EXTREMITY STRENGTH: WNL   LYMPHEDEMA ASSESSMENTS:   LANDMARK RIGHT 08/19/2021 LEFT 08/19/2021  10 cm proximal to olecranon process 37 38.6  Olecranon process 29.2 30.3  10 cm proximal to ulnar styloid process 25.7 26.8  Just proximal to ulnar styloid process 17.5 18.7  Across hand at thumb web space 19.8 20  At base of 2nd digit 6.3 6.3  (Blank rows = not tested)   L-DEX LYMPHEDEMA SCREENING:  The patient was assessed using the L-Dex machine today to produce a lymphedema index baseline score. The patient will be reassessed on a regular basis (typically every 3 months) to obtain new L-Dex scores. If the score is > 6.5 points away from his/her baseline score indicating onset of subclinical lymphedema, it will be recommended to wear a  compression garment for 4 weeks, 12 hours per day and then be reassessed. If the score continues to be > 6.5 points from baseline at reassessment, we will initiate lymphedema treatment. Assessing in this manner has a 95% rate of preventing clinically significant lymphedema.  L-DEX LYMPHEDEMA SCREENING Measurement Type: Unilateral L-DEX MEASUREMENT EXTREMITY: Upper Extremity POSITION : Standing DOMINANT SIDE: Right At Risk Side: Left BASELINE SCORE (UNILATERAL): 3.5  QUICK DASH SURVEY:  [name] - 08/19/21 0001     Open a tight or new jar No difficulty    Do heavy household chores (wash walls, wash floors) Moderate difficulty    Carry a shopping bag or briefcase Mild difficulty    Wash your back No difficulty    Use a knife to cut food No difficulty    Recreational activities in which you take some force or impact through your arm, shoulder, or hand (golf, hammering, tennis) Unable    During the past week, to what extent has your arm, shoulder or hand problem interfered with your normal social activities with family, friends, neighbors, or groups? Slightly    During the past week, to what extent has your arm, shoulder or hand problem limited your work or other regular daily activities Not at all    Arm, shoulder, or hand pain. None    Tingling (pins and needles) in your arm, shoulder, or hand None    Difficulty Sleeping No difficulty    DASH Score 18.18 %               PATIENT EDUCATION:  Education details: Lymphedema risk reduction and post op shoulder/posture HEP Person educated: Patient Education method: Explanation, Demonstration, Handout Education comprehension: Patient verbalized understanding and returned demonstration   HOME EXERCISE PROGRAM: Patient was instructed today in a home exercise program today for post op shoulder range of motion. These included active assist shoulder flexion in sitting, scapular retraction, wall walking with shoulder abduction, and hands  behind head external rotation.  She was encouraged to do these twice a day, holding 3 seconds  and repeating 5 times when permitted by her physician.   ASSESSMENT:  CLINICAL IMPRESSION: Patient was diagnosed on 08/07/2021 with left grade III invasive ductal carcinoma breast cancer. It measures 2.8 cm and is located in the lower inner quadrant. It is weakly (2%) ER positive, PR and HER2 negative with a Ki67 of 40%. Her multidisciplinary medical team met prior to her assessments to determine a recommended treatment plan. She is planning to have neoadjuvant chemotherapy followed by a left lumpectomy and either targeted node dissection or axillary lymph node dissection depending on MRI findings, followed by radiation. She will benefit from a post op PT reassessment to determine needs and from L-Dex screens every 3 months for 2 years to detect subclinical lymphedema.  Pt will benefit from skilled therapeutic intervention to improve on the following deficits: Decreased knowledge of precautions, impaired UE functional use, pain, decreased ROM, postural dysfunction.   PT treatment/interventions: ADL/self-care home management, pt/family education, therapeutic exercise  REHAB POTENTIAL: Excellent  CLINICAL DECISION MAKING: Stable/uncomplicated  EVALUATION COMPLEXITY: Low   GOALS: Goals reviewed with patient? YES  LONG TERM GOALS: (STG=LTG)   Name Target Date Goal status  1 Pt will be able to verbalize understanding of pertinent lymphedema risk reduction practices relevant to her dx specifically related to skin care.  Baseline:  No knowledge 08/19/2021 Achieved at eval  2 Pt will be able to return demo and/or verbalize understanding of the post op HEP related to regaining shoulder ROM. Baseline:  No knowledge 08/19/2021 Achieved at eval  3 Pt will be able to verbalize understanding of the importance of attending the post op After Breast CA Class for further lymphedema risk reduction education and  therapeutic exercise.  Baseline:  No knowledge 08/19/2021 Achieved at eval  4 Pt will demo she has regained full shoulder ROM and function post operatively compared to baselines.  Baseline: See objective measurements taken today. 02/16/2022      PLAN: PT FREQUENCY/DURATION: EVAL and 1 follow up appointment.   PLAN FOR NEXT SESSION: will reassess 3-4 weeks post op to determine needs.   Patient will follow up at outpatient cancer rehab 3-4 weeks following surgery.  If the patient requires physical therapy at that time, a specific plan will be dictated and sent to the referring physician for approval. The patient was educated today on appropriate basic range of motion exercises to begin post operatively and the importance of attending the After Breast Cancer class following surgery.  Patient was educated today on lymphedema risk reduction practices as it pertains to recommendations that will benefit the patient immediately following surgery.  She verbalized good understanding.    Physical Therapy Information for After Breast Cancer Surgery/Treatment:  Lymphedema is a swelling condition that you may be at risk for in your arm if you have lymph nodes removed from the armpit area.  After a sentinel node biopsy, the risk is approximately 5-9% and is higher after an axillary node dissection.  There is treatment available for this condition and it is not life-threatening.  Contact your physician or physical therapist with concerns. You may begin the 4 shoulder/posture exercises (see additional sheet) when permitted by your physician (typically a week after surgery).  If you have drains, you may need to wait until those are removed before beginning range of motion exercises.  A general recommendation is to not lift your arms above shoulder height until drains are removed.  These exercises should be done to your tolerance and gently.  This is not a "  no pain/no gain" type of recovery so listen to your body and  stretch into the range of motion that you can tolerate, stopping if you have pain.  If you are having immediate reconstruction, ask your plastic surgeon about doing exercises as he or she may want you to wait. We encourage you to attend the free one time ABC (After Breast Cancer) class offered by [facility].  You will learn information related to lymphedema risk, prevention and treatment and additional exercises to regain mobility following surgery.  You can call [phone] for more information.  This is offered the 1st and 3rd Monday of each month.  You only attend the class one time. While undergoing any medical procedure or treatment, try to avoid blood pressure being taken or needle sticks from occurring on the arm on the side of cancer.   This recommendation begins after surgery and continues for the rest of your life.  This may help reduce your risk of getting lymphedema (swelling in your arm). An excellent resource for those seeking information on lymphedema is the National Lymphedema Network's web site. It can be accessed at [facility] If you notice swelling in your hand, arm or breast at any time following surgery (even if it is many years from now), please contact your doctor or physical therapist to discuss this.  Lymphedema can be treated at any time but it is easier for you if it is treated early on.  If you feel like your shoulder motion is not returning to normal in a reasonable amount of time, please contact your surgeon or physical therapist.  [facility] [place], [place], [place] [phone]; [address] [place], [place] [address] ABC CLASS After Breast Cancer Class  After Breast Cancer Class is a specially designed exercise class to assist you in a safe recover after having breast cancer surgery.  In this class you will learn how to get back to full function whether your drains were just removed or if you had surgery a month ago.  This one-time class is held the  1st and 3rd Monday of every month from 11:00 a.m. until 12:00 noon at the [facility] located at [place], [place] [address]  This class is FREE and space is limited. For more information or to register for the next available class, call [phone].  Class Goals  Understand specific stretches to improve the flexibility of you chest and shoulder. Learn ways to safely strengthen your upper body and improve your posture. Understand the warning signs of infection and why you may be at risk for an arm infection. Learn about Lymphedema and prevention.  ** You do not attend this class until after surgery.  Drains must be removed to participate  Patient was instructed today in a home exercise program today for post op shoulder range of motion. These included active assist shoulder flexion in sitting, scapular retraction, wall walking with shoulder abduction, and hands behind head external rotation.  She was encouraged to do these twice a day, holding 3 seconds and repeating 5 times when permitted by her physician.  Annia Friendly, [place] 08/19/21 1:06 PM

## 2021-08-19 NOTE — Progress Notes (Addendum)
[place]   [phone] Fax:([phone] [place] Note   Patient Care Team: Billie Ruddy, MD as PCP - General (Family Medicine) Erroll Luna, MD as Consulting Physician (General Surgery) Truitt Merle, MD as Consulting Physician (Hematology) Gery Pray, MD as Consulting Physician (Radiation Oncology)  Date of Service:  08/19/2021   CHIEF COMPLAINTS/PURPOSE OF CONSULTATION:  Left Breast Cancer, functionally triple negative  REFERRING PHYSICIAN:  [facility]   ASSESSMENT & PLAN:  Cheyenne [name] is a 41 y.o. premenopausal female with  1. Malignant neoplasm of lower-inner quadrant of left breast, Stage IIIA, c(T2, N1), Functionally triple negative, Grade 3 -presented with palpable left breast mass. B/l MM and left [place] on 08/07/21 showed: 2.8 cm left breast mass at 7:30; at least 4 abnormal lymph nodes. Biopsy that day confirmed IDC in breast but node was negative. ---We discussed her imaging findings and the biopsy results in great details. -We recommend breast MRI with and without contrast for further evaluation, especially her lymph nodes.  She will likely need a second biopsy of the lymph node -Her breast cancer is ER 2% positive, PR and HER2 negative, this is similar to triple negative breast cancer.  We discussed high risk of recurrence after surgery. -given the size of her tumor and number of suspicious lymph nodes, the recommendation is for neoadjuvant chemotherapy with carbo Taxol for 12 weeks, followed by dose dense Adriamycin and Cytoxan every 2 weeks for 4 cycles, along with immunotherapy Keytruda for 1 year.  --Chemotherapy consent: Side effects including but does not not limited to, fatigue, nausea, vomiting, diarrhea, hair loss, neuropathy, fluid retention, renal and kidney dysfunction, neutropenic fever, needed for blood transfusion, bleeding, cardiomyopathy, small risk of MDS and leukemia, pneumonitis, other autoimmune  related disorders, thyroid and other endocrine organ dysfunction, skin rash, arthralgia, etc were discussed with patient in great detail. She agrees to proceed. -The goal of chemotherapy is curative  -I also recommend breast MRI, PET or CT and bone scan, baseline echocardiogram.  -She was seen by Dr. Brantley Stage today. We will plan for lumpectomy following chemotherapy. -Giving the weakly positive ER and negative PR expression in her postmenopausal status, I do not feel adjuvant endocrine therapy is beneficial. -She was also seen by radiation oncologist Dr. Sondra Come today. She will likely benefit from breast radiation if she undergo lumpectomy to decrease the risk of breast cancer.  -We also discussed the breast cancer surveillance after her surgery. She will continue annual screening mammogram, self exam, and a routine office visit with lab and exam with [place]. -I encouraged her to have healthy diet and exercise regularly.  -I recommend her to stop oral contraceptives (she uses for abdominal cramps) due to her ER positive breast cancer -We discussed chemo induced infertility, patient does not plan to have children. She is married with a female, and would not need pregnancy test during chemotherapy.  2. Social Support -she has good support from her wife. -she has a history of anxiety and is on Xanax as needed. I advised her to f/u with her counselor. -she will consider FMLA. If she chooses to work, she would like to work from home. She will bring [place] any forms she needs filled out.   PLAN:  -breast MRI and PET to be done soon -She will likely need a second biopsy of abnormal axillary lymph node -baseline echo -chemo education class -port placement  -lab, flush, f/u and chemo carbo/taxol and keytruda in 2  weeks    Oncology History Overview Note   Cancer Staging  Malignant neoplasm of lower-inner quadrant of left breast in female, estrogen receptor positive ([place]) Staging form: Breast, AJCC 8th  Edition - Clinical stage from 08/07/2021: Stage IIIA (cT2, cN1, cM0, G3, ER+, PR-, HER2-) - Signed by Truitt Merle, MD on 08/18/2021    Malignant neoplasm of lower-inner quadrant of left breast in female, estrogen receptor positive ([place])  08/07/2021 Cancer Staging   Staging form: Breast, AJCC 8th Edition - Clinical stage from 08/07/2021: Stage IIB (cT2, cN0, cM0, G3, ER+, PR-, HER2-) - Signed by Truitt Merle, MD on 08/19/2021 Stage prefix: Initial diagnosis Histologic grading system: 3 grade system    08/07/2021 Mammogram   CLINICAL DATA:  Palpable mass in the LEFT breast since August.   EXAM: DIGITAL DIAGNOSTIC BILATERAL MAMMOGRAM WITH TOMOSYNTHESIS AND CAD; ULTRASOUND LEFT BREAST LIMITED  IMPRESSION: 1. Suspicious mass in the 7:30 o'clock location of the LEFT breast for which biopsy is recommended. 2. LEFT axillary adenopathy, with at least 4 abnormal lymph nodes.   08/07/2021 Initial Biopsy   Diagnosis 1. Breast, left, needle core biopsy, 7:30, ribbon clip - INVASIVE DUCTAL CARCINOMA - SEE COMMENT 2. Lymph node, needle/core biopsy, left axilla, coil clip - NO CARCINOMA IDENTIFIED Microscopic Comment 1. based on the biopsy, the carcinoma appears Nottingham grade 3 of 3 and measures 1.1 cm in greatest linear extent.  1. PROGNOSTIC INDICATORS Results: The tumor cells are EQUIVOCAL for Her2 (2+). Her2 by FISH will be performed and results reported separately. Estrogen Receptor: 2%, POSITIVE, WEAK STAINING INTENSITY Progesterone Receptor: <1%, NEGATIVE Proliferation Marker Ki67: 40%  1. FLUORESCENCE IN-SITU HYBRIDIZATION Results: GROUP 5: HER2 **NEGATIVE**   08/17/2021 Initial Diagnosis   Malignant neoplasm of lower-inner quadrant of left breast in female, estrogen receptor positive ([place])      HISTORY OF PRESENTING ILLNESS:  Cheyenne [name] 41 y.o. female is a here because of breast cancer. The [facility]. The patient presents to the clinic today  accompanied by her wife.   She initially noticed a mass in her left breast back around September 2022. She underwent bilateral diagnostic mammography and left breast ultrasonography on 08/07/21 showing: 2.8 cm left breast mass at 7:30; at least 4 abnormal lymph nodes.  Biopsy the same day showed: invasive ductal carcinoma, grade 3. Prognostic indicators significant for: estrogen receptor, 2% weakly positive and progesterone receptor, <1% negative. Proliferation marker Ki67 at 40%. HER2 negative by FISH.   Today the patient notes they felt/feeling prior/after... -breast pain, managed with ibuprofen -intermittent back pain around left shoulder   She has a PMHx of.... -anxiety, managed with medication  Socially... -she works Insurance underwriter, specifically retirement and disability claims. -she is married with no children (notes her wife is an IVF patient, possibly planning pregnancy) -she reports two second cousins with breast cancer (one at age 63, the other at age 65 and again in her 33's), otherwise no family history.   GYN HISTORY  Menarchal: 41 years old LMP: on continuous birth control HRT: n/a GP: 0   REVIEW OF SYSTEMS:    Constitutional: Denies fevers, chills or abnormal night sweats Eyes: Denies blurriness of vision, double vision or watery eyes Ears, nose, mouth, throat, and face: Denies mucositis or sore throat Respiratory: Denies cough, dyspnea or wheezes Cardiovascular: Denies palpitation, chest discomfort or lower extremity swelling Gastrointestinal:  Denies nausea, heartburn or change in bowel habits Skin: Denies abnormal skin rashes Lymphatics: Denies new lymphadenopathy or easy  bruising Neurological:Denies numbness, tingling or new weaknesses Behavioral/Psych: Mood is stable, no new changes  All other systems were reviewed with the patient and are negative.   MEDICAL HISTORY:  Past Medical History:  Diagnosis Date   Anal fissure    Anxiety    Breast cancer ([place])  08/10/21   Date diagnosis was given    SURGICAL HISTORY: Past Surgical History:  Procedure Laterality Date   NO PAST SURGERIES      SOCIAL HISTORY: Social History   Socioeconomic History   Marital status: Married    Spouse name: Not on file   Number of children: 0   Years of education: Not on file   Highest education level: Not on file  Occupational History   Occupation: Claims Rep  Tobacco Use   Smoking status: Never   Smokeless tobacco: Never  Vaping Use   Vaping Use: Never used  Substance and Sexual Activity   Alcohol use: Yes    Comment: Occasionally   Drug use: No   Sexual activity: Yes    Partners: Female  Other Topics Concern   Not on file  Social History Narrative   Married. Education: [employer]. Exercise: No.   Social Determinants of Health   Financial Resource Strain: Not on file  Food Insecurity: No Food Insecurity   Worried About Charity fundraiser in the Last Year: Never true   Ran Out of Food in the Last Year: Never true  Transportation Needs: No Transportation Needs   Lack of Transportation (Medical): No   Lack of Transportation (Non-Medical): No  Physical Activity: Not on file  Stress: Not on file  Social Connections: Not on file  Intimate Partner Violence: Not on file    FAMILY HISTORY: Family History  Problem Relation Age of Onset   Hypertension Mother    Edema Mother    Diabetes Mother    Obesity Mother    Diabetes Sister    Obesity Sister    Cancer Cousin 46       breast cancer   Cancer Cousin 49       breast cancer   Colon cancer Neg Hx    Esophageal cancer Neg Hx    Rectal cancer Neg Hx     ALLERGIES:  has No Known Allergies.  MEDICATIONS:  Current Outpatient Medications  Medication Sig Dispense Refill   ALAYCEN 1/35 tablet TAKE 1 (ONE) TABLET DAILY, CONTINUOUSLY  4   albuterol (VENTOLIN HFA) 108 (90 Base) MCG/ACT inhaler Inhale 2 puffs into the lungs every 4 (four) hours as needed for wheezing. 1 each 5   ALPRAZolam  (XANAX) 0.5 MG tablet Take one tablet twice daily as needed for anxiety. 45 tablet 2   desonide (DESOWEN) 0.05 % cream Apply topically 2 (two) times daily. 30 g 1   hydroquinone 4 % cream APPLY TO DARK SPOTS AT BEDTIME 28.35 g 0   ibuprofen (ADVIL) 800 MG tablet Take 800 mg by mouth every 8 (eight) hours as needed for moderate pain.     Multiple Vitamins-Minerals (ONE A DAY IMMUNITY DEFENSE) CHEW Chew 1 tablet by mouth daily.     No current facility-administered medications for this visit.    PHYSICAL EXAMINATION: ECOG PERFORMANCE STATUS: 0 - Asymptomatic  Vitals:   08/19/21 0841  BP: (!) 141/74  Pulse: 90  Resp: 18  Temp: 99.2 F (37.3 C)  SpO2: 99%   Filed Weights   08/19/21 0841  Weight: 205 lb 9.6 oz (93.3 kg)  GENERAL:alert, no distress and comfortable SKIN: skin color, texture, turgor are normal, no rashes or significant lesions EYES: normal, Conjunctiva are pink and non-injected, sclera clear  NECK: supple, thyroid normal size, non-tender, without nodularity LYMPH:  no palpable lymphadenopathy in the cervical, axillary  LUNGS: clear to auscultation and percussion with normal breathing effort HEART: regular rate & rhythm and no murmurs and no lower extremity edema ABDOMEN:abdomen soft, non-tender and normal bowel sounds Musculoskeletal:no cyanosis of digits and no clubbing  NEURO: alert & oriented x 3 with fluent speech, no focal motor/sensory deficits BREAST: 3 x 3 cm at 8-9 o'clock of left breast, with post-biopsy effects.   LABORATORY DATA:  I have reviewed the data as listed CBC Latest Ref Rng & Units 08/19/2021 04/02/2020 02/27/2019  WBC 4.0 - 10.5 K/uL 12.2(H) 11.0(H) 10.2  Hemoglobin 12.0 - 15.0 g/dL 13.3 13.0 12.5  Hematocrit 36.0 - 46.0 % 39.2 40.1 37.9  Platelets 150 - 400 K/uL 418(H) 466(H) 405.0(H)    CMP Latest Ref Rng & Units 08/19/2021 04/02/2020 02/27/2019  Glucose 70 - 99 mg/dL 146(H) 93 101(H)  BUN 6 - 20 mg/dL 13 10 12   Creatinine 0.44 - 1.00 mg/dL  0.90 0.88 0.84  Sodium 135 - 145 mmol/L 137 137 139  Potassium 3.5 - 5.1 mmol/L 3.5 4.1 3.7  Chloride 98 - 111 mmol/L 105 103 103  CO2 22 - 32 mmol/L 24 25 29   Calcium 8.9 - 10.3 mg/dL 8.9 9.2 9.3  Total Protein 6.5 - 8.1 g/dL 7.2 7.1 7.3  Total Bilirubin 0.3 - 1.2 mg/dL 0.4 0.8 0.4  Alkaline Phos 38 - 126 U/L 51 - 84  AST 15 - 41 U/L 12(L) 17 27  ALT 0 - 44 U/L 12 17 34     RADIOGRAPHIC STUDIES: I have personally reviewed the radiological images as listed and agreed with the findings in the report. US BREAST LTD UNI LEFT INC AXILLA  Result Date: 08/07/2021 CLINICAL DATA:  Palpable mass in the LEFT breast since August. EXAM: DIGITAL DIAGNOSTIC BILATERAL MAMMOGRAM WITH TOMOSYNTHESIS AND CAD; ULTRASOUND LEFT BREAST LIMITED TECHNIQUE: Bilateral digital diagnostic mammography and breast tomosynthesis was performed. The images were evaluated with computer-aided detection.; Targeted ultrasound examination of the left breast was performed. COMPARISON:  04/16/2014 ACR Breast Density Category b: There are scattered areas of fibroglandular density. FINDINGS: RIGHT breast is negative. An irregular mass in the LOWER INNER QUADRANT of the LEFT breast is marked with a BB as palpable. No associated microcalcifications. On physical exam, I palpate a discrete superficial mobile firm mass in the 7:30 o'clock location of the LEFT breast. Targeted ultrasound is performed, showing an irregular mass with spiculated margins in the 7:30 o'clock location the LEFT breast 7 centimeters from the nipple. Mass shows no internal blood flow on Doppler evaluation. No significant posterior acoustic features. Mass measures 2.8 x 1.7 x 1.6 centimeters. Evaluation of the LEFT axilla shows at least 4 lymph nodes with abnormal morphology. Other lymph nodes with normal morphology are also present, in the mid and UPPER LEFT axilla. IMPRESSION: 1. Suspicious mass in the 7:30 o'clock location of the LEFT breast for which biopsy is  recommended. 2. LEFT axillary adenopathy, with at least 4 abnormal lymph nodes. RECOMMENDATION: Recommend ultrasound-guided core biopsy of LEFT breast mass and LEFT axilla. I have discussed the findings and recommendations with the patient. If applicable, a reminder letter will be sent to the patient regarding the next appointment. BI-RADS CATEGORY  5: Highly suggestive of malignancy. Electronically Signed  By: Nolon Nations M.D.   On: 08/07/2021 09:59  MM DIAG BREAST TOMO BILATERAL  Result Date: 08/07/2021 CLINICAL DATA:  Palpable mass in the LEFT breast since August. EXAM: DIGITAL DIAGNOSTIC BILATERAL MAMMOGRAM WITH TOMOSYNTHESIS AND CAD; ULTRASOUND LEFT BREAST LIMITED TECHNIQUE: Bilateral digital diagnostic mammography and breast tomosynthesis was performed. The images were evaluated with computer-aided detection.; Targeted ultrasound examination of the left breast was performed. COMPARISON:  04/16/2014 ACR Breast Density Category b: There are scattered areas of fibroglandular density. FINDINGS: RIGHT breast is negative. An irregular mass in the LOWER INNER QUADRANT of the LEFT breast is marked with a BB as palpable. No associated microcalcifications. On physical exam, I palpate a discrete superficial mobile firm mass in the 7:30 o'clock location of the LEFT breast. Targeted ultrasound is performed, showing an irregular mass with spiculated margins in the 7:30 o'clock location the LEFT breast 7 centimeters from the nipple. Mass shows no internal blood flow on Doppler evaluation. No significant posterior acoustic features. Mass measures 2.8 x 1.7 x 1.6 centimeters. Evaluation of the LEFT axilla shows at least 4 lymph nodes with abnormal morphology. Other lymph nodes with normal morphology are also present, in the mid and UPPER LEFT axilla. IMPRESSION: 1. Suspicious mass in the 7:30 o'clock location of the LEFT breast for which biopsy is recommended. 2. LEFT axillary adenopathy, with at least 4 abnormal  lymph nodes. RECOMMENDATION: Recommend ultrasound-guided core biopsy of LEFT breast mass and LEFT axilla. I have discussed the findings and recommendations with the patient. If applicable, a reminder letter will be sent to the patient regarding the next appointment. BI-RADS CATEGORY  5: Highly suggestive of malignancy. Electronically Signed   By: Nolon Nations M.D.   On: 08/07/2021 09:59  [place] AXILLARY NODE CORE BIOPSY LEFT  Addendum Date: 08/10/2021   ADDENDUM REPORT: 08/10/2021 13:41 ADDENDUM: Pathology revealed GRADE III INVASIVE DUCTAL CARCINOMA of the LEFT breast, 7:30 o'clock, (ribbon clip). This was found to be concordant by Dr. Audie Pinto. Pathology revealed NO CARCINOMA IDENTIFIED of the LEFT axillary lymph node, (coil clip). This was found to be concordant by Dr. Audie Pinto. Pathology results were discussed with the patient by telephone. The patient reported doing well after the biopsies with tenderness at the sites. Post biopsy instructions and care were reviewed and questions were answered. The patient was encouraged to call [facility] for any additional concerns. My direct phone number was provided. The patient was referred to [facility] on August 19, 2021. Pathology results reported by Terie Purser, RN on 08/10/2021. Electronically Signed   By: Audie Pinto M.D.   On: 08/10/2021 13:41   Result Date: 08/10/2021 CLINICAL DATA:  41 year old female presenting for biopsy of a left breast mass and left axillary lymph node. EXAM: ULTRASOUND GUIDED LEFT BREAST CORE NEEDLE BIOPSY ULTRASOUND GUIDED LEFT AXILLARY BIOPSY COMPARISON:  Previous exam(s). PROCEDURE: I met with the patient and we discussed the procedure of ultrasound-guided biopsy, including benefits and alternatives. We discussed the high likelihood of a successful procedure. We discussed the risks of the procedure, including  infection, bleeding, tissue injury, clip migration, and inadequate sampling. Informed written consent was given. The usual time-out protocol was performed immediately prior to the procedure. 1.  Lesion quadrant: Lower inner quadrant Using sterile technique and 1% Lidocaine as local anesthetic, under direct ultrasound visualization, a 14 gauge spring-loaded device was used to perform biopsy of a mass in the left breast  at 7:30 o'clock using a inferior approach. At the conclusion of the procedure a ribbon tissue marker clip was deployed into the biopsy cavity. Follow up 2 view mammogram was performed and dictated separately. 2.  Left axilla Using sterile technique and 1% Lidocaine as local anesthetic, under direct ultrasound visualization, a 14 gauge spring-loaded device was used to perform biopsy of a left axillary lymph node using a lateral approach. At the conclusion of the procedure a coil tissue marker clip was deployed into the biopsy cavity. Follow up 2 view mammogram was performed and dictated separately. IMPRESSION: Ultrasound guided biopsy of a mass in the left breast at 7:30 o'clock and of a left axillary lymph node. No apparent complications. Electronically Signed: By: Audie Pinto M.D. On: 08/07/2021 11:03  MM CLIP PLACEMENT LEFT  Result Date: 08/07/2021 CLINICAL DATA:  Post procedure mammogram for clip placement. EXAM: 3D DIAGNOSTIC LEFT MAMMOGRAM POST ULTRASOUND BIOPSY COMPARISON:  Previous exam(s). FINDINGS: 3D Mammographic images were obtained following ultrasound guided biopsy of a mass in the left breast at 7:30 o'clock. The ribbon biopsy marking clip is in expected position at the site of biopsy. 3D Mammographic images were obtained following ultrasound guided biopsy of a left axillary lymph node. The coil biopsy marking clip is in expected position at the site of biopsy. IMPRESSION: Appropriate positioning of the ribbon shaped biopsy marking clip at the site of biopsy in the left breast at  7:30 o'clock. Appropriate positioning of the coil shaped biopsy marking clip at the site of biopsy in the left axilla. Final Assessment: Post Procedure Mammograms for Marker Placement Electronically Signed   By: Audie Pinto M.D.   On: 08/07/2021 10:53  [place] LT BREAST BX W LOC DEV 1ST LESION IMG BX SPEC US GUIDE  Addendum Date: 08/10/2021   ADDENDUM REPORT: 08/10/2021 13:41 ADDENDUM: Pathology revealed GRADE III INVASIVE DUCTAL CARCINOMA of the LEFT breast, 7:30 o'clock, (ribbon clip). This was found to be concordant by Dr. Audie Pinto. Pathology revealed NO CARCINOMA IDENTIFIED of the LEFT axillary lymph node, (coil clip). This was found to be concordant by Dr. Audie Pinto. Pathology results were discussed with the patient by telephone. The patient reported doing well after the biopsies with tenderness at the sites. Post biopsy instructions and care were reviewed and questions were answered. The patient was encouraged to call [facility] for any additional concerns. My direct phone number was provided. The patient was referred to [facility] on August 19, 2021. Pathology results reported by Terie Purser, RN on 08/10/2021. Electronically Signed   By: Audie Pinto M.D.   On: 08/10/2021 13:41   Result Date: 08/10/2021 CLINICAL DATA:  41 year old female presenting for biopsy of a left breast mass and left axillary lymph node. EXAM: ULTRASOUND GUIDED LEFT BREAST CORE NEEDLE BIOPSY ULTRASOUND GUIDED LEFT AXILLARY BIOPSY COMPARISON:  Previous exam(s). PROCEDURE: I met with the patient and we discussed the procedure of ultrasound-guided biopsy, including benefits and alternatives. We discussed the high likelihood of a successful procedure. We discussed the risks of the procedure, including infection, bleeding, tissue injury, clip migration, and inadequate sampling. Informed written consent was given.  The usual time-out protocol was performed immediately prior to the procedure. 1.  Lesion quadrant: Lower inner quadrant Using sterile technique and 1% Lidocaine as local anesthetic, under direct ultrasound visualization, a 14 gauge spring-loaded device was used to perform biopsy of a mass in the left breast at  7:30 o'clock using a inferior approach. At the conclusion of the procedure a ribbon tissue marker clip was deployed into the biopsy cavity. Follow up 2 view mammogram was performed and dictated separately. 2.  Left axilla Using sterile technique and 1% Lidocaine as local anesthetic, under direct ultrasound visualization, a 14 gauge spring-loaded device was used to perform biopsy of a left axillary lymph node using a lateral approach. At the conclusion of the procedure a coil tissue marker clip was deployed into the biopsy cavity. Follow up 2 view mammogram was performed and dictated separately. IMPRESSION: Ultrasound guided biopsy of a mass in the left breast at 7:30 o'clock and of a left axillary lymph node. No apparent complications. Electronically Signed: By: Audie Pinto M.D. On: 08/07/2021 11:03    Orders Placed This Encounter  Procedures   NM PET Image Initial (PI) Skull Base To Thigh    Standing Status:   Future    Standing Expiration Date:   08/19/2022    Order Specific Question:   If indicated for the ordered procedure, I authorize the administration of a radiopharmaceutical per Radiology protocol    Answer:   Yes    Order Specific Question:   Is the patient pregnant?    Answer:   No    Order Specific Question:   Preferred imaging location?    Answer:   [facility]    All questions were answered. The patient knows to call the clinic with any problems, questions or concerns. The total time spent in the appointment was 60 minutes.     Truitt Merle, MD 08/19/2021   I, Wilburn Mylar, am acting as scribe for Truitt Merle, MD.   I have reviewed the above documentation for accuracy  and completeness, and I agree with the above.

## 2021-08-19 NOTE — Progress Notes (Signed)
[place] Work  Initial Assessment   Cheyenne [name] is a 41 y.o. year old female accompanied by spouse, Jasmine. Clinical Social Work was referred by [facility] for assessment of psychosocial needs.   SDOH (Social Determinants of Health) assessments performed: Yes SDOH Interventions    Flowsheet Row Most Recent Value  SDOH Interventions   Food Insecurity Interventions Intervention Not Indicated  Housing Interventions Intervention Not Indicated  Transportation Interventions Intervention Not Indicated       Distress Screen completed: Yes ONCBCN DISTRESS SCREENING 08/19/2021  Screening Type Initial Screening  Distress experienced in past week (1-10) 8  Practical problem type Work/school  Emotional problem type Nervousness/Anxiety;Adjusting to illness  Physical Problem type Pain      Family/Social Information:  Housing Arrangement: patient lives with wife Family members/support persons in your life? Family and Friends/Colleagues Transportation concerns: no  Employment: Working full time for IT trainer as a Engineer, mining. Income source: Employment Financial concerns:  Potentially with cost of cold cap Food access concerns: no Medication Concerns: no  Services Currently in place:  psychiatrist at [place]  Coping/ Adjustment to diagnosis: Patient understands treatment plan and what happens next? yes, is interested in cold cap but wondering about cost Concerns about diagnosis and/or treatment:  general adjustment. How will she do handling her diagnosis while working and hearing about other people's concerns Patient reported stressors: Work/ school, Anxiety, and Adjusting to my illness Patient enjoys being outside, time with family/ friends, and travel Current coping skills/ strengths: Active sense of humor , Capable of independent living , Motivation for treatment/growth , Special hobby/interest , and Supportive family/friends      SUMMARY: Current SDOH Barriers:  Potential barrier with cost of cold cap. Nurse navigator assisting with this  Interventions: Discussed common feeling and emotions when being diagnosed with cancer, and the importance of support during treatment Informed patient of the support team roles and support services at [facility] Provided CSW contact information and encouraged patient to call with any questions or concerns Provided patient with information about [place] which can potentially assist with costs related to cold cap   Follow Up Plan: Patient will contact CSW with any support or resource needs Patient verbalizes understanding of plan: Yes    Christeen Douglas LCSW

## 2021-08-20 ENCOUNTER — Encounter: Payer: Self-pay | Admitting: Genetic Counselor

## 2021-08-20 ENCOUNTER — Encounter: Payer: Self-pay | Admitting: Hematology

## 2021-08-20 ENCOUNTER — Other Ambulatory Visit: Payer: Self-pay | Admitting: Hematology

## 2021-08-20 NOTE — Progress Notes (Signed)
REFERRING PROVIDER: Truitt Merle, MD [address] [place], [place] [address]  PRIMARY PROVIDER:  Billie Ruddy, MD  PRIMARY REASON FOR VISIT:  1. Malignant neoplasm of lower-inner quadrant of left breast in female, estrogen receptor positive ([place])     HISTORY OF PRESENT ILLNESS:   Cheyenne [name], a 41 y.o. female, was seen for a [place] cancer genetics consultation during the breast multidisciplinary clinic at the request of Dr. Burr Medico due to a personal and family history of cancer.  Cheyenne [name] presents to clinic today to discuss the possibility of a hereditary predisposition to cancer, to discuss genetic testing, and to further clarify her future cancer risks, as well as potential cancer risks for family members.   In January 2023, at the age of 33, Cheyenne [name] was diagnosed with invasive ductal carcinoma of the left breast. The tumor is functionally triple negative (ER 2%+, PR-, HER2-).  CANCER HISTORY:  Oncology History Overview Note   Cancer Staging  Malignant neoplasm of lower-inner quadrant of left breast in female, estrogen receptor positive ([place]) Staging form: Breast, AJCC 8th Edition - Clinical stage from 08/07/2021: Stage IIIA (cT2, cN1, cM0, G3, ER+, PR-, HER2-) - Signed by Truitt Merle, MD on 08/18/2021    Malignant neoplasm of lower-inner quadrant of left breast in female, estrogen receptor positive ([place])  08/07/2021 Cancer Staging   Staging form: Breast, AJCC 8th Edition - Clinical stage from 08/07/2021: Stage IIB (cT2, cN0, cM0, G3, ER+, PR-, HER2-) - Signed by Truitt Merle, MD on 08/19/2021 Stage prefix: Initial diagnosis Histologic grading system: 3 grade system    08/07/2021 Mammogram   CLINICAL DATA:  Palpable mass in the LEFT breast since August.   EXAM: DIGITAL DIAGNOSTIC BILATERAL MAMMOGRAM WITH TOMOSYNTHESIS AND CAD; ULTRASOUND LEFT BREAST LIMITED  IMPRESSION: 1. Suspicious mass in the 7:30 o'clock location of the LEFT breast for which biopsy is  recommended. 2. LEFT axillary adenopathy, with at least 4 abnormal lymph nodes.   08/07/2021 Initial Biopsy   Diagnosis 1. Breast, left, needle core biopsy, 7:30, ribbon clip - INVASIVE DUCTAL CARCINOMA - SEE COMMENT 2. Lymph node, needle/core biopsy, left axilla, coil clip - NO CARCINOMA IDENTIFIED Microscopic Comment 1. based on the biopsy, the carcinoma appears Nottingham grade 3 of 3 and measures 1.1 cm in greatest linear extent.  1. PROGNOSTIC INDICATORS Results: The tumor cells are EQUIVOCAL for Her2 (2+). Her2 by FISH will be performed and results reported separately. Estrogen Receptor: 2%, POSITIVE, WEAK STAINING INTENSITY Progesterone Receptor: <1%, NEGATIVE Proliferation Marker Ki67: 40%  1. FLUORESCENCE IN-SITU HYBRIDIZATION Results: GROUP 5: HER2 **NEGATIVE**   08/17/2021 Initial Diagnosis   Malignant neoplasm of lower-inner quadrant of left breast in female, estrogen receptor positive ([name])   09/02/2021 -  Chemotherapy   Patient is on Treatment Plan : BREAST Pembrolizumab (200) D1 + Carboplatin (5) D1 + Paclitaxel (80) D1,8,15 q21d X 4 cycles / Pembrolizumab (200) D1 + AC D1 q21d x 4 cycles        RISK FACTORS:  Menarche was at age 33.  She has never given birth Ovaries intact: yes.  Uterus intact: yes.  Menopausal status: premenopausal.  HRT use: 0 years. Colonoscopy: yes;  one adenoma . Mammogram within the last year: yes. Any excessive radiation exposure in the past: yes  Past Medical History:  Diagnosis Date   Anal fissure    Anxiety    Breast cancer ([place]) 08/10/21   Date diagnosis was given    Past Surgical History:  Procedure Laterality  Date   NO PAST SURGERIES      Social History   Socioeconomic History   Marital status: Married    Spouse name: Not on file   Number of children: 0   Years of education: Not on file   Highest education level: Not on file  Occupational History   Occupation: Claims Rep  Tobacco Use   Smoking status: Never    Smokeless tobacco: Never  Vaping Use   Vaping Use: Never used  Substance and Sexual Activity   Alcohol use: Yes    Comment: Occasionally   Drug use: No   Sexual activity: Yes    Partners: Female  Other Topics Concern   Not on file  Social History Narrative   Married. Education: [employer]. Exercise: No.   Social Determinants of Health   Financial Resource Strain: Not on file  Food Insecurity: No Food Insecurity   Worried About Charity fundraiser in the Last Year: Never true   Ran Out of Food in the Last Year: Never true  Transportation Needs: No Transportation Needs   Lack of Transportation (Medical): No   Lack of Transportation (Non-Medical): No  Physical Activity: Not on file  Stress: Not on file  Social Connections: Not on file     FAMILY HISTORY:  We obtained a detailed, 4-generation family history.  Significant diagnoses are listed below: Family History  Problem Relation Age of Onset   Hypertension Mother    Edema Mother    Diabetes Mother    Obesity Mother    Diabetes Sister    Obesity Sister    Breast cancer Other 30   Breast cancer Other 91       dx. with breast cancer again at age 68 (unsure if recurrence or second primary)   Colon cancer Neg Hx    Esophageal cancer Neg Hx    Rectal cancer Neg Hx       Cheyenne [name] has a paternal first cousin once removed diagnosed with breast cancer at age 52. She has a another paternal first cousin once removed who was diagnosed with breast cancer at 27 and again at age 7 (unsure if it was a recurrence or second primary). Cheyenne [name] is unaware of previous family history of genetic testing for hereditary cancer risks. There is no reported Ashkenazi Jewish ancestry.  GENETIC COUNSELING ASSESSMENT: Ms. Varone is a 41 y.o. female with a personal and family history of cancer which is somewhat suggestive of a hereditary cancer syndrome and predisposition to cancer given her young age at diagnosis. We, therefore, discussed and  recommended the following at today's visit.   DISCUSSION: We discussed that 5 - 10% of cancer is hereditary, with most cases of hereditary breast cancer associated with mutations in BRCA1/2.  There are other genes that can be associated with hereditary breast cancer syndromes. Type of cancer risk and level of risk are gene-specific. We discussed that testing is beneficial for several reasons including knowing how to follow individuals after completing their treatment, identifying whether potential treatment options would be beneficial, and understanding if other family members could be at risk for cancer and allowing them to undergo genetic testing.   We reviewed the characteristics, features and inheritance patterns of hereditary cancer syndromes. We also discussed genetic testing, including the appropriate family members to test, the process of testing, insurance coverage and turn-around-time for results. We discussed the implications of a negative, positive and/or variant of uncertain significant result. In order to get genetic  test results in a timely manner so that Ms. Gamboa can use these genetic test results for surgical decisions, we recommended Ms. Fessenden pursue genetic testing for the BRCAplus. Once complete, we recommend Ms. Centanni pursue reflex genetic testing to a more comprehensive gene panel.   Ms. Giampietro  was offered a common hereditary cancer panel (47 genes) and an expanded pan-cancer panel (77 genes). Ms. Hegna was informed of the benefits and limitations of each panel, including that expanded pan-cancer panels contain genes that do not have clear management guidelines at this point in time.  We also discussed that as the number of genes included on a panel increases, the chances of variants of uncertain significance increases.  After considering the benefits and limitations of each gene panel, Ms. Enck elected to have [facility] CancerNext-Expanded+RNA.  The CancerNext-Expanded  gene panel offered by [facility] and includes sequencing, rearrangement, and RNA analysis for the following 77 genes: AIP, ALK, APC, ATM, AXIN2, BAP1, BARD1, BLM, BMPR1A, BRCA1, BRCA2, BRIP1, CDC73, CDH1, CDK4, CDKN1B, CDKN2A, CHEK2, CTNNA1, DICER1, FANCC, FH, FLCN, GALNT12, KIF1B, LZTR1, MAX, MEN1, MET, MLH1, MSH2, MSH3, MSH6, MUTYH, NBN, NF1, NF2, NTHL1, PALB2, PHOX2B, PMS2, POT1, PRKAR1A, PTCH1, PTEN, RAD51C, RAD51D, RB1, RECQL, RET, SDHA, SDHAF2, SDHB, SDHC, SDHD, SMAD4, SMARCA4, SMARCB1, SMARCE1, STK11, SUFU, TMEM127, TP53, TSC1, TSC2, VHL and XRCC2 (sequencing and deletion/duplication); EGFR, EGLN1, HOXB13, KIT, MITF, PDGFRA, POLD1, and POLE (sequencing only); EPCAM and GREM1 (deletion/duplication only).    Based on Ms. Lietzke's personal and family history of cancer, she meets medical criteria for genetic testing. Despite that she meets criteria, she may still have an out of pocket cost. We discussed that if her out of pocket cost for testing is over $100, the laboratory should contact them to discuss self-pay prices, patient pay assistance programs, if applicable, and other billing options.   PLAN: After considering the risks, benefits, and limitations, Ms. Stepien provided informed consent to pursue genetic testing and the blood sample was sent to [facility] for analysis of the CancerNext-Expanded Panel. Results should be available within approximately 1-2 weeks' time, at which point they will be disclosed by telephone to Ms. Harbold, as will any additional recommendations warranted by these results. Ms. Ellwood will receive a summary of her genetic counseling visit and a copy of her results once available. This information will also be available in Epic.   Ms. Yamin questions were answered to her satisfaction today. Our contact information was provided should additional questions or concerns arise. Thank you for the referral and allowing [place] to share in the care of your patient.    Lucille Passy, MS, [facility] Genetic Counselor [email or web address] (P) [phone]  The patient was seen for a total of 20 minutes in face-to-face genetic counseling.  The patient brought her wife, [name]. Drs. Lindi Adie and/or Burr Medico were available to discuss this case as needed.  _______________________________________________________________________ For Office Staff:  Number of people involved in session: 2 Was an Intern/ student involved with case: no

## 2021-08-21 ENCOUNTER — Encounter (HOSPITAL_BASED_OUTPATIENT_CLINIC_OR_DEPARTMENT_OTHER): Payer: Self-pay | Admitting: Surgery

## 2021-08-21 ENCOUNTER — Telehealth: Payer: Self-pay | Admitting: Hematology

## 2021-08-21 ENCOUNTER — Other Ambulatory Visit: Payer: Self-pay

## 2021-08-21 NOTE — Telephone Encounter (Signed)
Spoke with patient to update on changes made to appointments, patient had additional questions about FMLA and a work from home letter, advised I would pass that message on to her Navigators

## 2021-08-24 ENCOUNTER — Encounter (HOSPITAL_BASED_OUTPATIENT_CLINIC_OR_DEPARTMENT_OTHER)
Admission: RE | Admit: 2021-08-24 | Discharge: 2021-08-24 | Disposition: A | Discharge status: Home / Self Care | Payer: Federal, State, Local not specified - PPO | Source: Ambulatory Visit | Attending: Surgery | Admitting: Surgery

## 2021-08-24 ENCOUNTER — Encounter: Payer: Self-pay | Admitting: *Deleted

## 2021-08-24 ENCOUNTER — Telehealth: Payer: Self-pay | Admitting: *Deleted

## 2021-08-24 DIAGNOSIS — Z01812 Encounter for preprocedural laboratory examination: Secondary | ICD-10-CM | POA: Insufficient documentation

## 2021-08-24 LAB — CBC WITH DIFFERENTIAL/PLATELET
Abs Immature Granulocytes: 0.05 10*3/uL (ref 0.00–0.07)
Basophils Absolute: 0.1 10*3/uL (ref 0.0–0.1)
Basophils Relative: 1 %
Eosinophils Absolute: 0.2 10*3/uL (ref 0.0–0.5)
Eosinophils Relative: 2 %
HCT: 38.9 % (ref 36.0–46.0)
Hemoglobin: 13.1 g/dL (ref 12.0–15.0)
Immature Granulocytes: 0 %
Lymphocytes Relative: 26 %
Lymphs Abs: 3.2 10*3/uL (ref 0.7–4.0)
MCH: 29.8 pg (ref 26.0–34.0)
MCHC: 33.7 g/dL (ref 30.0–36.0)
MCV: 88.4 fL (ref 80.0–100.0)
Monocytes Absolute: 0.7 10*3/uL (ref 0.1–1.0)
Monocytes Relative: 6 %
Neutro Abs: 8.1 10*3/uL — ABNORMAL HIGH (ref 1.7–7.7)
Neutrophils Relative %: 65 %
Platelets: 440 10*3/uL — ABNORMAL HIGH (ref 150–400)
RBC: 4.4 MIL/uL (ref 3.87–5.11)
RDW: 12.8 % (ref 11.5–15.5)
WBC: 12.3 10*3/uL — ABNORMAL HIGH (ref 4.0–10.5)
nRBC: 0 % (ref 0.0–0.2)

## 2021-08-24 LAB — COMPREHENSIVE METABOLIC PANEL
ALT: 13 U/L (ref 0–44)
AST: 16 U/L (ref 15–41)
Albumin: 3.2 g/dL — ABNORMAL LOW (ref 3.5–5.0)
Alkaline Phosphatase: 50 U/L (ref 38–126)
Anion gap: 11 (ref 5–15)
BUN: 10 mg/dL (ref 6–20)
CO2: 24 mmol/L (ref 22–32)
Calcium: 8.8 mg/dL — ABNORMAL LOW (ref 8.9–10.3)
Chloride: 104 mmol/L (ref 98–111)
Creatinine, Ser: 0.91 mg/dL (ref 0.44–1.00)
GFR, Estimated: >60 mL/min (ref >60)
Glucose, Bld: 106 mg/dL — ABNORMAL HIGH (ref 70–99)
Potassium: 3.6 mmol/L (ref 3.5–5.1)
Sodium: 139 mmol/L (ref 135–145)
Total Bilirubin: 0.5 mg/dL (ref 0.3–1.2)
Total Protein: 6.7 g/dL (ref 6.5–8.1)

## 2021-08-24 NOTE — Telephone Encounter (Signed)
Spoke with patient to follow up from [facility] 1/18 and assess navigation needs. Informed patient I have done 2 letters for her for work and will leave at the front desk for her to pick up.  Reviewed appointments.  Encouraged her to call should she have any questions or concerns. Patient verbalized understanding.

## 2021-08-24 NOTE — Progress Notes (Signed)

## 2021-08-26 ENCOUNTER — Inpatient Hospital Stay: Payer: Federal, State, Local not specified - PPO

## 2021-08-26 ENCOUNTER — Encounter: Payer: Self-pay | Admitting: Hematology

## 2021-08-26 ENCOUNTER — Ambulatory Visit (HOSPITAL_COMMUNITY)
Admission: RE | Admit: 2021-08-26 | Discharge: 2021-08-26 | Disposition: A | Discharge status: Home / Self Care | Payer: Federal, State, Local not specified - PPO | Source: Ambulatory Visit | Attending: Hematology | Admitting: Hematology

## 2021-08-26 ENCOUNTER — Other Ambulatory Visit: Payer: Self-pay

## 2021-08-26 DIAGNOSIS — Z170 Estrogen receptor positive status [ER+]: Secondary | ICD-10-CM | POA: Diagnosis not present

## 2021-08-26 DIAGNOSIS — D0512 Intraductal carcinoma in situ of left breast: Secondary | ICD-10-CM | POA: Diagnosis not present

## 2021-08-26 DIAGNOSIS — C50312 Malignant neoplasm of lower-inner quadrant of left female breast: Secondary | ICD-10-CM | POA: Diagnosis not present

## 2021-08-26 MED ORDER — GADOBUTROL 1 MMOL/ML IV SOLN
10.0000 mL | Freq: Once | INTRAVENOUS | Status: AC | PRN
  Administered 2021-08-26: 08:00:00 10 mL via INTRAVENOUS

## 2021-08-26 NOTE — Progress Notes (Signed)
Electronically faxed application for copay for [facility] to [employer]. Fax received successfully per email.

## 2021-08-26 NOTE — Progress Notes (Signed)
Met with patient and accompanying adult at registration to introduce myself as Arboriculturist and to offer available resources.  Discussed one-time $1000 Radio broadcast assistant to assist with personal expenses while going through treatment.  Also, discussed available copay assistance if needed for [facility].She states she will need assistance based on insurance amounts starting over. Gave her patient portion of application to complete and leave for me today. I will work on other portion and have provider complete their portion.  Gave her my card for any additional financial questions or concerns.

## 2021-08-26 NOTE — Progress Notes (Signed)
Received signed copay applications from patient for Keytruda and Nivestym.  Obtained physician signatures on both applications.

## 2021-08-26 NOTE — Progress Notes (Signed)
Electronically faxed Hydrographic surveyor for [employer]. Fax received successfully.

## 2021-08-26 NOTE — Progress Notes (Signed)
Pharmacist Chemotherapy Monitoring - Initial Assessment    Anticipated start date: 09/02/21   The following has been reviewed per standard work regarding the patient's treatment regimen: The patient's diagnosis, treatment plan and drug doses, and organ/hematologic function Lab orders and baseline tests specific to treatment regimen  The treatment plan start date, drug sequencing, and pre-medications Prior authorization status  Patient's documented medication list, including drug-drug interaction screen and prescriptions for anti-emetics and supportive care specific to the treatment regimen The drug concentrations, fluid compatibility, administration routes, and timing of the medications to be used The patient's access for treatment and lifetime cumulative dose history, if applicable  The patient's medication allergies and previous infusion related reactions, if applicable   Changes made to treatment plan:  N/A  Follow up needed:  ECHO scheduled 08/28/21 and port placement 09/01/21   Philomena Course, [place], 08/26/2021  2:24 PM

## 2021-08-27 ENCOUNTER — Encounter: Payer: Self-pay | Admitting: *Deleted

## 2021-08-28 ENCOUNTER — Telehealth: Payer: Self-pay | Admitting: *Deleted

## 2021-08-28 ENCOUNTER — Inpatient Hospital Stay: Payer: Federal, State, Local not specified - PPO | Admitting: General Practice

## 2021-08-28 ENCOUNTER — Other Ambulatory Visit: Payer: Self-pay

## 2021-08-28 ENCOUNTER — Encounter: Payer: Self-pay | Admitting: General Practice

## 2021-08-28 ENCOUNTER — Encounter: Payer: Self-pay | Admitting: Hematology

## 2021-08-28 ENCOUNTER — Ambulatory Visit (HOSPITAL_COMMUNITY)
Admission: RE | Admit: 2021-08-28 | Discharge: 2021-08-28 | Disposition: A | Discharge status: Home / Self Care | Payer: Federal, State, Local not specified - PPO | Source: Ambulatory Visit | Attending: Hematology | Admitting: Hematology

## 2021-08-28 ENCOUNTER — Telehealth: Payer: Self-pay | Admitting: Genetic Counselor

## 2021-08-28 DIAGNOSIS — Z170 Estrogen receptor positive status [ER+]: Secondary | ICD-10-CM | POA: Insufficient documentation

## 2021-08-28 DIAGNOSIS — Z0189 Encounter for other specified special examinations: Secondary | ICD-10-CM | POA: Diagnosis not present

## 2021-08-28 DIAGNOSIS — Z01818 Encounter for other preprocedural examination: Secondary | ICD-10-CM | POA: Diagnosis not present

## 2021-08-28 DIAGNOSIS — C50312 Malignant neoplasm of lower-inner quadrant of left female breast: Secondary | ICD-10-CM | POA: Diagnosis not present

## 2021-08-28 LAB — ECHOCARDIOGRAM COMPLETE
AR max vel: 2.21 cm2
AV Area VTI: 2.28 cm2
AV Area mean vel: 2.2 cm2
AV Mean grad: 8 mmHg
AV Peak grad: 13.7 mmHg
Ao pk vel: 1.85 m/s
Area-P 1/2: 4.68 cm2
S' Lateral: 2.7 cm

## 2021-08-28 NOTE — Progress Notes (Signed)
Echocardiogram 2D Echocardiogram has been performed.  Arlyss Gandy 08/28/2021, 8:54 AM

## 2021-08-28 NOTE — Progress Notes (Signed)
Received determination letter from [employer] for [employer].  Patient approved for a maximum benefit of $10,000 08/27/21 - 08/01/22 paying a little as $0 copay after insurance pays their portion.A copy of the approval letter will be mailed to patient for her records only.  A copy attached in [facility] [employer] and provided to [facility] for billing/claim submissions.  Patient has my card for any additional financial questions or concerns.

## 2021-08-28 NOTE — Progress Notes (Signed)
[facility] Spiritual Care Note ° °Met with Iyana in [facility] to review HCPOA and Living Will forms. She determined that she would like more time to discern her selections, potentially discussing with her physician, so we scheduled a follow-up AD Clinic appointment for her on Friday, March 24 at 10:30 am. If she is not ready to complete AD at that time, she will call in advance to postpone her appointment. ° ° °Chaplain  , MDiv, BCC °Pager [phone] °Voicemail [phone]  °

## 2021-08-28 NOTE — Telephone Encounter (Signed)
Spoke with patient to let her know that her breast MRI only showed known disease. She informed she has had some stomach pain since yesterday. She states she hasn't had much of a bowel movement in 4 days.  She did start some colace last night with some results and is starting to feel better today. Informed her that this sounds like constipation and she could add some miralax as well.  Patient verbalized understanding.

## 2021-08-31 ENCOUNTER — Emergency Department (HOSPITAL_COMMUNITY)
Admission: EM | Admit: 2021-08-31 | Discharge: 2021-09-01 | Disposition: A | Discharge status: Home / Self Care | Payer: Federal, State, Local not specified - PPO | Attending: Emergency Medicine | Admitting: Emergency Medicine

## 2021-08-31 ENCOUNTER — Encounter (HOSPITAL_COMMUNITY)
Admission: RE | Admit: 2021-08-31 | Discharge: 2021-08-31 | Disposition: A | Discharge status: Home / Self Care | Payer: Federal, State, Local not specified - PPO | Source: Ambulatory Visit | Attending: Hematology | Admitting: Hematology

## 2021-08-31 ENCOUNTER — Other Ambulatory Visit: Payer: Self-pay

## 2021-08-31 ENCOUNTER — Encounter: Payer: Self-pay | Admitting: Hematology

## 2021-08-31 ENCOUNTER — Encounter (HOSPITAL_COMMUNITY): Payer: Self-pay | Admitting: *Deleted

## 2021-08-31 ENCOUNTER — Encounter (HOSPITAL_COMMUNITY): Payer: Self-pay | Admitting: Certified Registered"

## 2021-08-31 ENCOUNTER — Encounter: Payer: Self-pay | Admitting: Genetic Counselor

## 2021-08-31 DIAGNOSIS — E860 Dehydration: Secondary | ICD-10-CM | POA: Diagnosis not present

## 2021-08-31 DIAGNOSIS — D72829 Elevated white blood cell count, unspecified: Secondary | ICD-10-CM | POA: Diagnosis not present

## 2021-08-31 DIAGNOSIS — R9431 Abnormal electrocardiogram [ECG] [EKG]: Secondary | ICD-10-CM | POA: Diagnosis not present

## 2021-08-31 DIAGNOSIS — C50312 Malignant neoplasm of lower-inner quadrant of left female breast: Secondary | ICD-10-CM | POA: Insufficient documentation

## 2021-08-31 DIAGNOSIS — Z170 Estrogen receptor positive status [ER+]: Secondary | ICD-10-CM | POA: Insufficient documentation

## 2021-08-31 DIAGNOSIS — Z853 Personal history of malignant neoplasm of breast: Secondary | ICD-10-CM | POA: Diagnosis not present

## 2021-08-31 DIAGNOSIS — K529 Noninfective gastroenteritis and colitis, unspecified: Secondary | ICD-10-CM | POA: Insufficient documentation

## 2021-08-31 DIAGNOSIS — N739 Female pelvic inflammatory disease, unspecified: Secondary | ICD-10-CM | POA: Diagnosis not present

## 2021-08-31 DIAGNOSIS — Z1379 Encounter for other screening for genetic and chromosomal anomalies: Secondary | ICD-10-CM | POA: Insufficient documentation

## 2021-08-31 DIAGNOSIS — N6320 Unspecified lump in the left breast, unspecified quadrant: Secondary | ICD-10-CM | POA: Diagnosis not present

## 2021-08-31 DIAGNOSIS — C50912 Malignant neoplasm of unspecified site of left female breast: Secondary | ICD-10-CM | POA: Diagnosis not present

## 2021-08-31 DIAGNOSIS — K760 Fatty (change of) liver, not elsewhere classified: Secondary | ICD-10-CM | POA: Diagnosis not present

## 2021-08-31 DIAGNOSIS — R109 Unspecified abdominal pain: Secondary | ICD-10-CM | POA: Diagnosis not present

## 2021-08-31 DIAGNOSIS — R1084 Generalized abdominal pain: Secondary | ICD-10-CM | POA: Diagnosis not present

## 2021-08-31 LAB — CBC WITH DIFFERENTIAL/PLATELET
Abs Immature Granulocytes: 0.09 K/uL — ABNORMAL HIGH (ref 0.00–0.07)
Basophils Absolute: 0.1 K/uL (ref 0.0–0.1)
Basophils Relative: 1 %
Eosinophils Absolute: 0.1 K/uL (ref 0.0–0.5)
Eosinophils Relative: 1 %
HCT: 40.8 % (ref 36.0–46.0)
Hemoglobin: 13.9 g/dL (ref 12.0–15.0)
Immature Granulocytes: 1 %
Lymphocytes Relative: 21 %
Lymphs Abs: 2.6 K/uL (ref 0.7–4.0)
MCH: 29.4 pg (ref 26.0–34.0)
MCHC: 34.1 g/dL (ref 30.0–36.0)
MCV: 86.4 fL (ref 80.0–100.0)
Monocytes Absolute: 1.2 K/uL — ABNORMAL HIGH (ref 0.1–1.0)
Monocytes Relative: 10 %
Neutro Abs: 8.2 K/uL — ABNORMAL HIGH (ref 1.7–7.7)
Neutrophils Relative %: 66 %
Platelets: 469 K/uL — ABNORMAL HIGH (ref 150–400)
RBC: 4.72 MIL/uL (ref 3.87–5.11)
RDW: 12.7 % (ref 11.5–15.5)
WBC: 12.2 K/uL — ABNORMAL HIGH (ref 4.0–10.5)
nRBC: 0 % (ref 0.0–0.2)

## 2021-08-31 LAB — COMPREHENSIVE METABOLIC PANEL
ALT: 39 U/L (ref 0–44)
AST: 25 U/L (ref 15–41)
Albumin: 3.2 g/dL — ABNORMAL LOW (ref 3.5–5.0)
Alkaline Phosphatase: 83 U/L (ref 38–126)
Anion gap: 13 (ref 5–15)
BUN: 14 mg/dL (ref 6–20)
CO2: 18 mmol/L — ABNORMAL LOW (ref 22–32)
Calcium: 8.8 mg/dL — ABNORMAL LOW (ref 8.9–10.3)
Chloride: 101 mmol/L (ref 98–111)
Creatinine, Ser: 0.88 mg/dL (ref 0.44–1.00)
GFR, Estimated: >60 mL/min (ref >60)
Glucose, Bld: 156 mg/dL — ABNORMAL HIGH (ref 70–99)
Potassium: 3.1 mmol/L — ABNORMAL LOW (ref 3.5–5.1)
Sodium: 132 mmol/L — ABNORMAL LOW (ref 135–145)
Total Bilirubin: 0.5 mg/dL (ref 0.3–1.2)
Total Protein: 7.7 g/dL (ref 6.5–8.1)

## 2021-08-31 LAB — LIPASE, BLOOD: Lipase: 40 U/L (ref 11–51)

## 2021-08-31 LAB — URINALYSIS, ROUTINE W REFLEX MICROSCOPIC
Bilirubin Urine: NEGATIVE
Glucose, UA: NEGATIVE mg/dL
Ketones, ur: NEGATIVE mg/dL
Leukocytes,Ua: NEGATIVE
Nitrite: NEGATIVE
Protein, ur: 100 mg/dL — AB
Specific Gravity, Urine: 1.005 (ref 1.005–1.030)
pH: 7 (ref 5.0–8.0)

## 2021-08-31 LAB — GLUCOSE, CAPILLARY: Glucose-Capillary: 123 mg/dL — ABNORMAL HIGH (ref 70–99)

## 2021-08-31 MED ORDER — FLUDEOXYGLUCOSE F - 18 (FDG) INJECTION
10.0000 | Freq: Once | INTRAVENOUS | Status: AC | PRN
  Administered 2021-08-31: 10.12 via INTRAVENOUS

## 2021-08-31 NOTE — Telephone Encounter (Signed)
I contacted Cheyenne [name] to discuss her genetic testing results. No pathogenic variants were identified in the first 8 genes analyzed. Of note, we are still waiting on additional genes and I will contact her when they are available. Detailed clinic note to follow.  The test report has been scanned into EPIC and is located under the Molecular Pathology section of the Results Review tab.  A portion of the result report is included below for reference.   Cheyenne Passy, MS, [facility] Genetic Counselor [email or web address] (P) [phone]

## 2021-08-31 NOTE — Anesthesia Preprocedure Evaluation (Deleted)
Anesthesia Evaluation    Reviewed: Allergy & Precautions, H&P , Patient's Chart, lab work & pertinent test results  Airway        Dental   Pulmonary neg pulmonary ROS,           Cardiovascular Exercise Tolerance: Good + DVT    Echo 1/23 1. Left ventricular ejection fraction, by estimation, is 55 to 60%. The left ventricle has normal function. The left ventricle has no regional wall motion abnormalities. There is mild concentric left ventricular hypertrophy. Left ventricular diastolic  parameters were normal. 2. Right ventricular systolic function is normal. The right ventricular size is normal. 3. The mitral valve is normal in structure. No evidence of mitral valve regurgitation. No evidence of mitral stenosis. 4. The aortic valve is normal in structure. Aortic valve regurgitation is trivial. No aortic stenosis is present. 5. The inferior vena cava is normal in size with greater than 50% respiratory variability, suggesting right atrial pressure of 3 mmHg.   Neuro/Psych Anxiety negative neurological ROS     GI/Hepatic negative GI ROS, Neg liver ROS,   Endo/Other  negative endocrine ROS  Renal/GU negative Renal ROS  negative genitourinary   Musculoskeletal   Abdominal   Peds  Hematology negative hematology ROS (+)   Anesthesia Other Findings   Reproductive/Obstetrics negative OB ROS                             Anesthesia Physical Anesthesia Plan  ASA: 2  Anesthesia Plan: General   Post-op Pain Management:    Induction: Intravenous  PONV Risk Score and Plan: 3 and Ondansetron, Dexamethasone, Treatment may vary due to age or medical condition and Midazolam  Airway Management Planned: LMA and Oral ETT  Additional Equipment:   Intra-op Plan:   Post-operative Plan:   Informed Consent: I have reviewed the patients History and Physical, chart, labs and discussed the procedure  including the risks, benefits and alternatives for the proposed anesthesia with the patient or authorized representative who has indicated his/her understanding and acceptance.       Plan Discussed with: CRNA and Anesthesiologist  Anesthesia Plan Comments: ( )        Anesthesia Quick Evaluation

## 2021-08-31 NOTE — Progress Notes (Signed)
Pt and spouse called stating the pt has been having severe diarrhea.  Pt stated she took a stool softener (Colace) and GasX last week d/t sharp pain in her abdomen d/t constipation and now has diarrhea.  Pt stated she's having 7 to 10 bowel movements a day.  Pt described stool as yellowish in color with some consistency but mostly watery.  Pt stated she's having stomach cramping and shortly afterwards she has a bowel movement.  Pt stated she's drinking 2 bottles of water and 2 small bottles of Gatorade daily but has not eaten much d/t loss of appetite.  Pt denied any foul smell of stool and denied taking any OTC anti-diarrhea medications.  Instructed pt to purchase Immodium AD and Liquid IV.  Instructed pt to take the Immodium AD as directed on the box and drink the Liquid IV at least 3 times per day.  Pt verbalized understanding of instructions.  Instructed pt to give her bowels a rest by eating broth and etc.  Also, instructed pt to give Dr. Ernestina Penna office a call if the diarrhea has not slowed down in 2 days.  Pt stated she has an appt with Dr. Burr Medico in 2 day along with infusion.  Informed pt if diarrhea has not stopped by then, Dr. Burr Medico will probably want a stool sample to r/o C-Diff.  Pt verbalized understanding of instructions and had no further questions or concerns at this time.

## 2021-08-31 NOTE — ED Provider Triage Note (Signed)
Emergency Medicine Provider Triage Evaluation Note  Cheyenne [name] , a 41 y.o. female  was evaluated in triage.  Patient was recently diagnosed with stage II invasive ductal carcinoma of the breast.  She is in the process of receiving staging and set to start chemotherapy on February 1.  She had a PET scan done with results today that show what appears to be an inflammatory process of the central mesentery and pelvis, possible PID, appendicitis or enteritis.  She did have an elevated blood count 1 week ago at over 13.  Her oncologist called her today and sent her to the ED for evaluation.  She does have some mild epigastric abdominal pain but denies pelvic pain, vaginal bleeding, vaginal discharge, urinary symptoms.  She has been having several episodes of diarrhea a day.  Review of Systems  Positive: Epigastric pain, diarrhea Negative: Pelvic pain, urinary symptoms, fever, chills  Physical Exam  BP 128/82 (BP Location: Right Arm)    Pulse 100    Temp 98.5 F (36.9 C) (Oral)    Resp 16    Ht 5\' 2"  (1.575 m)    Wt 88 kg    SpO2 100%    BMI 35.48 kg/m  Gen:   Awake, no distress   Resp:  Normal effort  MSK:   Moves extremities without difficulty  Other:  Abdomen is soft, rounded.  There is tenderness in the epigastric region on palpation  Medical Decision Making  Medically screening exam initiated at 8:02 PM.  Appropriate orders placed.  Wynona Luna was informed that the remainder of the evaluation will be completed by another provider, this initial triage assessment does not replace that evaluation, and the importance of remaining in the ED until their evaluation is complete.     Tonye Pearson, [place] 08/31/21 2004

## 2021-08-31 NOTE — ED Triage Notes (Signed)
Pt states that she went for a PET scan today, and was told her WBC was elevated and needed to come to the ED. Hx of breast cancer, starts chemo on the 1st. Has had new lower abdominal pain since Thursday. Increased fatigue.

## 2021-09-01 ENCOUNTER — Ambulatory Visit (HOSPITAL_BASED_OUTPATIENT_CLINIC_OR_DEPARTMENT_OTHER)
Admission: RE | Admit: 2021-09-01 | Payer: Federal, State, Local not specified - PPO | Source: Home / Self Care | Admitting: Surgery

## 2021-09-01 ENCOUNTER — Encounter: Payer: Self-pay | Admitting: *Deleted

## 2021-09-01 ENCOUNTER — Telehealth: Payer: Self-pay | Admitting: *Deleted

## 2021-09-01 ENCOUNTER — Other Ambulatory Visit: Payer: Self-pay

## 2021-09-01 ENCOUNTER — Emergency Department (HOSPITAL_COMMUNITY): Payer: Federal, State, Local not specified - PPO

## 2021-09-01 DIAGNOSIS — Z170 Estrogen receptor positive status [ER+]: Secondary | ICD-10-CM

## 2021-09-01 DIAGNOSIS — I4510 Unspecified right bundle-branch block: Secondary | ICD-10-CM

## 2021-09-01 DIAGNOSIS — R109 Unspecified abdominal pain: Secondary | ICD-10-CM | POA: Diagnosis not present

## 2021-09-01 HISTORY — DX: Acute embolism and thrombosis of unspecified deep veins of unspecified lower extremity: I82.409

## 2021-09-01 HISTORY — DX: Unspecified right bundle-branch block: I45.10

## 2021-09-01 LAB — PREGNANCY, URINE: Preg Test, Ur: NEGATIVE

## 2021-09-01 SURGERY — INSERTION, TUNNELED CENTRAL VENOUS DEVICE, WITH PORT
Anesthesia: General

## 2021-09-01 MED ORDER — SODIUM CHLORIDE 0.9 % IV SOLN
3.0000 g | Freq: Once | INTRAVENOUS | Status: AC
  Administered 2021-09-01: 3 g via INTRAVENOUS
  Filled 2021-09-01: qty 8

## 2021-09-01 MED ORDER — AMOXICILLIN-POT CLAVULANATE 875-125 MG PO TABS: 1.0000 | ORAL_TABLET | Freq: Two times a day (BID) | ORAL | 0 refills | Status: DC

## 2021-09-01 MED ORDER — ONDANSETRON HCL 4 MG/2ML IJ SOLN
4.0000 mg | Freq: Once | INTRAMUSCULAR | Status: AC
  Administered 2021-09-01: 4 mg via INTRAVENOUS
  Filled 2021-09-01: qty 2

## 2021-09-01 MED ORDER — LACTATED RINGERS IV BOLUS
1000.0000 mL | Freq: Once | INTRAVENOUS | Status: AC
  Administered 2021-09-01: 1000 mL via INTRAVENOUS

## 2021-09-01 MED ORDER — SODIUM CHLORIDE (PF) 0.9 % IJ SOLN
INTRAMUSCULAR | Status: AC
  Filled 2021-09-01: qty 50

## 2021-09-01 MED ORDER — IOHEXOL 300 MG/ML  SOLN
100.0000 mL | Freq: Once | INTRAMUSCULAR | Status: AC | PRN
  Administered 2021-09-01: 100 mL via INTRAVENOUS

## 2021-09-01 MED FILL — Fosaprepitant Dimeglumine For IV Infusion 150 MG (Base Eq): INTRAVENOUS | Qty: 5 | Status: AC

## 2021-09-01 NOTE — Progress Notes (Signed)
Spoke with pt via telephone following her ED visit yesterday.  Pt went for a PET Scan and Ray with [facility] tried contacting Dr. Burr Medico after hours with critical results.  A report of acute inflammation in abdomen, possible ruptured appendix, WBC is elevated was noted.  Dr. Burr Medico recommended that the pt go to the ED.  Pt and spouse reported they went to the ED and it was determined that the pt has enteritis or colitis.  The pt was going to be admitted but pt and spouse did not want to be admitted.  Pt was d/c home on PO Abx and some pain medication.  Pt stated she's feeling much better but still have some pain.  Pt had an appt with IR for PAC placement for chemotherapy starting on 09/02/2021.  PAC was not placed and pt will have f/u Dr. Burr Medico on 09/02/2021 and supposedly starting chemo.  Per conversation between Cira Rue, NP and pt, pt will keep f/u appt with Dr. Burr Medico and infusion for possible hydration instead of chemotherapy.

## 2021-09-01 NOTE — ED Provider Notes (Signed)
[facility] DEPT Provider Note   CSN: [id number] Arrival date & time: 08/31/21  1846     History  Chief Complaint - abdominal pain   Cheyenne [name] is a 41 y.o. female.  The history is provided by the patient.  Abdominal Pain Pain location:  Generalized Pain quality: aching and bloating   Pain severity:  Moderate Onset quality:  Gradual Duration:  5 days Timing:  Constant Progression:  Worsening Chronicity:  New Relieved by:  Nothing Worsened by:  Movement and palpation Associated symptoms: diarrhea and nausea   Associated symptoms: no chest pain, no dysuria, no fever, no shortness of breath and no vomiting   Associated symptoms comment:  Diarrhea after colace Risk factors: has not had multiple surgeries   Patient with history of breast cancer presents with abdominal pain.  This been ongoing for up to 5 days.  She reports associated nausea.  She had been constipated, but is now having diarrhea after taking Colace Patient had recent PET scan and was informed to go to the ER for evaluation No previous abdominal surgery Past Medical History:  Diagnosis Date   Anal fissure    Anxiety    Breast cancer ([place]) 08/10/21   Date diagnosis was given   DVT (deep venous thrombosis) ([facility])     Home Medications Prior to Admission medications   Medication Sig Start Date End Date Taking? Authorizing Provider  amoxicillin-clavulanate (AUGMENTIN) 875-125 MG tablet Take 1 tablet by mouth every 12 (twelve) hours. 09/01/21  Yes Ripley Fraise, MD  ALAYCEN 1/35 tablet TAKE 1 (ONE) TABLET DAILY, CONTINUOUSLY 02/13/18   [provider]  albuterol (VENTOLIN HFA) 108 (90 Base) MCG/ACT inhaler Inhale 2 puffs into the lungs every 4 (four) hours as needed for wheezing. 03/12/21   Billie Ruddy, MD  ALPRAZolam Duanne Moron) 0.5 MG tablet Take one tablet twice daily as needed for anxiety. 06/02/21   Mozingo, Berdie Ogren, NP  desonide (DESOWEN) 0.05 % cream  Apply topically 2 (two) times daily. 06/12/18   McVey, Gelene Mink, PA-C  hydroquinone 4 % cream APPLY TO DARK SPOTS AT BEDTIME 03/12/21   Billie Ruddy, MD  ibuprofen (ADVIL) 800 MG tablet Take 800 mg by mouth every 8 (eight) hours as needed for moderate pain.    [provider]  lidocaine-prilocaine (EMLA) cream Apply to affected area once 08/19/21   Truitt Merle, MD  Multiple Vitamins-Minerals (ONE A DAY IMMUNITY DEFENSE) CHEW Chew 1 tablet by mouth daily.    [provider]  ondansetron (ZOFRAN) 8 MG tablet Take 1 tablet (8 mg total) by mouth 2 (two) times daily as needed. Start on the third day after carboplatin and AC chemotherapy. 08/19/21   Truitt Merle, MD  prochlorperazine (COMPAZINE) 10 MG tablet Take 1 tablet (10 mg total) by mouth every 6 (six) hours as needed (Nausea or vomiting). 08/19/21   Truitt Merle, MD      Allergies    Patient has no known allergies.    Review of Systems   Review of Systems  Constitutional:  Negative for fever.  Respiratory:  Negative for shortness of breath.   Cardiovascular:  Negative for chest pain.  Gastrointestinal:  Positive for abdominal pain, diarrhea and nausea. Negative for vomiting.  Genitourinary:  Negative for dysuria.  All other systems reviewed and are negative.  Physical Exam Updated Vital Signs BP 106/60    Pulse 91    Temp 98.5 F (36.9 C) (Oral)    Resp 19  Ht 1.575 m (5\' 2" )    Wt 88 kg    SpO2 99%    BMI 35.48 kg/m  Physical Exam CONSTITUTIONAL: Well developed/well nourished HEAD: Normocephalic/atraumatic EYES: EOMI/PERRL, no icterus NECK: supple no meningeal signs SPINE/BACK:entire spine nontender CV: S1/S2 noted, no murmurs/rubs/gallops noted LUNGS: Lungs are clear to auscultation bilaterally, no apparent distress ABDOMEN: soft, mild epigastric tenderness, no rebound or guarding, bowel sounds noted throughout abdomen GU:no cva tenderness NEURO: Pt is awake/alert/appropriate, moves all extremitiesx4.  No  facial droop.   EXTREMITIES: pulses normal/equal, full ROM SKIN: warm, color normal PSYCH: no abnormalities of mood noted, alert and oriented to situation  ED Results / Procedures / Treatments   Labs (all labs ordered are listed, but only abnormal results are displayed) Labs Reviewed  CBC WITH DIFFERENTIAL/PLATELET - Abnormal; Notable for the following components:      Result Value   WBC 12.2 (*)    Platelets 469 (*)    Neutro Abs 8.2 (*)    Monocytes Absolute 1.2 (*)    Abs Immature Granulocytes 0.09 (*)    All other components within normal limits  COMPREHENSIVE METABOLIC PANEL - Abnormal; Notable for the following components:   Sodium 132 (*)    Potassium 3.1 (*)    CO2 18 (*)    Glucose, Bld 156 (*)    Calcium 8.8 (*)    Albumin 3.2 (*)    All other components within normal limits  URINALYSIS, ROUTINE W REFLEX MICROSCOPIC - Abnormal; Notable for the following components:   Hgb urine dipstick LARGE (*)    Protein, ur 100 (*)    Bacteria, UA MANY (*)    All other components within normal limits  LIPASE, BLOOD  PREGNANCY, URINE    EKG EKG Interpretation  Date/Time:  Tuesday September 01 2021 01:06:45 EST Ventricular Rate:  108 PR Interval:  142 QRS Duration: 114 QT Interval:  353 QTC Calculation: 474 R Axis:   57 Text Interpretation: Sinus tachycardia Atrial premature complexes Incomplete right bundle branch block Confirmed by Ripley Fraise [phone]) on 09/01/2021 1:36:24 AM  Radiology CT ABDOMEN PELVIS W CONTRAST  Result Date: 09/01/2021 CLINICAL DATA:  Abdominal inflammatory process on PET, lower abdominal pain, leukocytosis. Newly diagnosed breast cancer EXAM: CT ABDOMEN AND PELVIS WITH CONTRAST TECHNIQUE: Multidetector CT imaging of the abdomen and pelvis was performed using the standard protocol following bolus administration of intravenous contrast. RADIATION DOSE REDUCTION: This exam was performed according to the departmental dose-optimization program which  includes automated exposure control, adjustment of the mA and/or kV according to patient size and/or use of iterative reconstruction technique. CONTRAST:  167mL OMNIPAQUE IOHEXOL 300 MG/ML  SOLN COMPARISON:  PET-CT dated 08/31/2021 FINDINGS: Lower chest: Lung bases are clear. 2.1 cm lesion in the medial left breast (series 2/image 9), likely corresponding to the patient's known primary breast cancer. Hepatobiliary: Liver is within normal limits. Gallbladder is unremarkable. No intrahepatic or extrahepatic ductal dilatation. Pancreas: Within normal limits. Spleen: Within normal limits. Adrenals/Urinary Tract: Adrenal glands are within normal limits. Kidneys are within normal limits.  No hydronephrosis. Bladder is within normal limits. Stomach/Bowel: Stomach is within normal limits. Multiple thick-walled loops of distal small bowel (series 2/images 55, 58, and 65), suggesting infectious/inflammatory enteritis. Associated mesenteric stranding with mild interloop fluid in small volume pelvic ascites. No pneumatosis or free air. No evidence of bowel obstruction. Normal appendix (series 2/image 85). Vascular/Lymphatic: No evidence of abdominal aortic aneurysm. No suspicious abdominopelvic lymphadenopathy. Reproductive: Uterus is within normal limits. Bilateral ovaries  are unremarkable. Other: Small volume pelvic ascites, as noted above. Musculoskeletal: No focal osseous lesions. IMPRESSION: Multiple thick-walled loops of distal small bowel, suggesting infectious/inflammatory enteritis. Associated small volume pelvic ascites. No pneumatosis or free air. No evidence of bowel obstruction.  Normal appendix. 2.1 cm lesion in the medial left breast, likely corresponding to the patient's known primary breast neoplasm. No findings suspicious for metastatic disease. Electronically Signed   By: Julian Hy M.D.   On: 09/01/2021 01:59   NM PET Image Initial (PI) Skull Base To Thigh  Result Date: 08/31/2021 CLINICAL DATA:   Initial treatment strategy for left breast cancer. EXAM: NUCLEAR MEDICINE PET SKULL BASE TO THIGH TECHNIQUE: 10.12 mCi F-18 FDG was injected intravenously. Full-ring PET imaging was performed from the skull base to thigh after the radiotracer. CT data was obtained and used for attenuation correction and anatomic localization. Fasting blood glucose: 123 mg/dl COMPARISON:  Breast MRI 08/26/2021. FINDINGS: Mediastinal blood pool activity: SUV max 2.5 NECK: No hypermetabolic cervical lymph nodes are identified.There are no lesions of the pharyngeal mucosal space. Incidental CT findings: none CHEST: Known mass medially in the left breast is significantly hypermetabolic. This mass measures 1.5 x 2.1 cm on image 77/4 and has an SUV max of 20.3. No other hypermetabolic breast masses are identified. There are no hypermetabolic mediastinal, hilar, internal mammary or axillary lymph nodes. Small left axillary lymph nodes are present with an SUV max of 2.2, similar to blood pool. No hypermetabolic pulmonary activity or suspicious nodularity. Incidental CT findings: none ABDOMEN/PELVIS: There is no hypermetabolic activity within the liver, adrenal glands, spleen or pancreas. There is no hypermetabolic nodal activity. There is suspicion of an inflammatory process involving the central mesentery and pelvis with ill-defined focal hypermetabolic activity within the base of the mesentery in the false pelvis. This corresponds with a 1.6 cm nodular density on image 142/4 and has an SUV max of 9.4. There is possible associated wall thickening of the terminal ileum. A normal appearing appendix is not clearly visualized. There is a moderate amount of free pelvic fluid which is without significant hypermetabolic activity. There is no pneumoperitoneum or focal drainable fluid collection. Incidental CT findings: Mild hepatic steatosis. As above, suspected acute inflammatory process in the small bowel mesentery and pelvis. SKELETON: There is  no focal hypermetabolic activity to suggest osseous metastatic disease. Bone marrow activity is mildly increased diffusely. Incidental CT findings: none IMPRESSION: 1. Known mass medially in the left breast is markedly hypermetabolic consistent with breast cancer. 2. No evidence of nodal metastases within the chest. No typical distant metastases. 3. Suspected acute inflammatory process involving central mesentery and pelvis with ill-defined focal hypermetabolic activity anterior to the sacrum. There are pelvic inflammatory changes and free pelvic fluid. Recent labs demonstrate mild leukocytosis. Differential considerations include acute appendicitis, enteritis and small perforated foreign body. No evidence of drainable fluid collection, pneumoperitoneum or bowel obstruction. 4. These results were called by telephone at the time of interpretation on 08/31/2021 at 5:12 pm to on-call oncology nurse, [name], who verbally acknowledged these results. Unless there is a good clinical explanation for these findings, evaluation in the emergency department is likely warranted, and patient may benefit from abdominopelvic CT with oral and intravenous contrast. Electronically Signed   By: Richardean Sale M.D.   On: 08/31/2021 17:15    Procedures Procedures    Medications Ordered in ED Medications  ondansetron (ZOFRAN) injection 4 mg (4 mg Intravenous Given 09/01/21 0120)  lactated ringers bolus 1,000 mL (0 mLs  Intravenous Stopped 09/01/21 0257)  sodium chloride (PF) 0.9 % injection (  Given by Other 09/01/21 0204)  iohexol (OMNIPAQUE) 300 MG/ML solution 100 mL (100 mLs Intravenous Contrast Given 09/01/21 0136)  lactated ringers bolus 1,000 mL (1,000 mLs Intravenous New Bag/Given 09/01/21 0301)  Ampicillin-Sulbactam (UNASYN) 3 g in sodium chloride 0.9 % 100 mL IVPB (0 g Intravenous Stopped 09/01/21 0343)    ED Course/ Medical Decision Making/ A&P Clinical Course as of 09/01/21 0436  Tue Sep 01, 2021  0017 CO2(!):  18 Dehydration [DW]  0017 Glucose(!): 156 Hyperglycemia [DW]  0247 Labs reveal dehydration [DW]  0247 CT imaging reveals enteritis.  We will start IV antibiotics. [DW]  [phone] Patient feeling improved.  No acute distress We discussed lab and CT findings.  She is safe for discharge home. [DW]    Clinical Course User Index [DW] Ripley Fraise, MD                           Medical Decision Making Amount and/or Complexity of Data Reviewed Labs: ordered. Decision-making details documented in ED Course. ECG/medicine tests: ordered.  Risk Prescription drug management.   This patient presents to the ED for concern of abdominal pain, this involves an extensive number of treatment options, and is a complaint that carries with it a high risk of complications and morbidity.  The differential diagnosis includes pancreatitis, cholecystitis, cholelithiasis, bowel obstruction, bowel perforation, appendicitis, PID  Comorbidities that complicate the patient evaluation: Patients presentation is complicated by their history of breast cancer   Additional history obtained:  Records reviewed recent PET scan  Lab Tests: I Ordered, and personally interpreted labs.  The pertinent results include: Leukocytosis  Imaging Studies ordered: I ordered imaging studies including CT scan abdomen pelvis I independently visualized and interpreted imaging which showed enteritis I agree with the radiologist interpretation  Cardiac Monitoring: The patient was maintained on a cardiac monitor.  I personally viewed and interpreted the cardiac monitor which showed an underlying rhythm of:  sinus tachycardia  Medicines ordered and prescription drug management: I ordered medication including Unasyn for enteritis Zofran for nausea Reevaluation of the patient after these medicines showed that the patient    improved  Critical Interventions:       IV fluids and antibiotics  Consultations Obtained: No  consultations needed  Reevaluation: After the interventions noted above, I reevaluated the patient and found that they have :improved  Complexity of problems addressed: Patients presentation is most consistent with  acute complicated illness/injury requiring diagnostic workup      Disposition: After consideration of the diagnostic results and the patients response to treatment,  I feel that the patent would benefit from discharge .   Patient improved.  No acute distress BP 106/60    Pulse 91    Temp 98.5 F (36.9 C) (Oral)    Resp 19    Ht 1.575 m (5\' 2" )    Wt 88 kg    SpO2 99%    BMI 35.48 kg/m  She tolerated antibiotics well She is safe for discharge home and outpatient management.         Final Clinical Impression(s) / ED Diagnoses Final diagnoses:  Enteritis  Dehydration    Rx / DC Orders ED Discharge Orders          Ordered    amoxicillin-clavulanate (AUGMENTIN) 875-125 MG tablet  Every 12 hours        09/01/21 0244  Ripley Fraise, MD 09/01/21 [phone]

## 2021-09-01 NOTE — Discharge Instructions (Addendum)

## 2021-09-01 NOTE — Telephone Encounter (Signed)
Called to follow up with patient in regards to recent PET and visit to the ED for enteritis.  Patient states she is feeling a little better.  She did cancel her port that was scheduled for today. Informed that we will most likely cancel her chemo for tomorrow and give her body time to heal before beginning chemo.  Patient verbalized understanding. Informed her I would verify with Dr. Burr Medico and get back with her.    Called patient back and left voicemail that Dr. Burr Medico would like her to keep her apt with Dr> Burr Medico tomorrow and that we would cancel her chemo. Requested a  return phone call to confirm she received my message.

## 2021-09-02 ENCOUNTER — Encounter: Payer: Self-pay | Admitting: *Deleted

## 2021-09-02 ENCOUNTER — Other Ambulatory Visit: Payer: Self-pay

## 2021-09-02 ENCOUNTER — Encounter: Payer: Self-pay | Admitting: Hematology

## 2021-09-02 ENCOUNTER — Inpatient Hospital Stay: Payer: Federal, State, Local not specified - PPO

## 2021-09-02 ENCOUNTER — Encounter (HOSPITAL_COMMUNITY): Payer: Federal, State, Local not specified - PPO

## 2021-09-02 ENCOUNTER — Inpatient Hospital Stay: Payer: Federal, State, Local not specified - PPO | Attending: Hematology | Admitting: Hematology

## 2021-09-02 VITALS — BP 109/64 | HR 103 | Temp 98.7°F | Resp 18 | Ht 62.0 in | Wt 203.4 lb

## 2021-09-02 DIAGNOSIS — Z5111 Encounter for antineoplastic chemotherapy: Secondary | ICD-10-CM | POA: Insufficient documentation

## 2021-09-02 DIAGNOSIS — Z170 Estrogen receptor positive status [ER+]: Secondary | ICD-10-CM | POA: Insufficient documentation

## 2021-09-02 DIAGNOSIS — Z5112 Encounter for antineoplastic immunotherapy: Secondary | ICD-10-CM | POA: Diagnosis not present

## 2021-09-02 DIAGNOSIS — K529 Noninfective gastroenteritis and colitis, unspecified: Secondary | ICD-10-CM | POA: Insufficient documentation

## 2021-09-02 DIAGNOSIS — Z79899 Other long term (current) drug therapy: Secondary | ICD-10-CM | POA: Insufficient documentation

## 2021-09-02 DIAGNOSIS — Z5189 Encounter for other specified aftercare: Secondary | ICD-10-CM | POA: Diagnosis not present

## 2021-09-02 DIAGNOSIS — C50312 Malignant neoplasm of lower-inner quadrant of left female breast: Secondary | ICD-10-CM | POA: Insufficient documentation

## 2021-09-02 DIAGNOSIS — A09 Infectious gastroenteritis and colitis, unspecified: Secondary | ICD-10-CM | POA: Diagnosis not present

## 2021-09-02 DIAGNOSIS — Z86718 Personal history of other venous thrombosis and embolism: Secondary | ICD-10-CM | POA: Diagnosis not present

## 2021-09-02 LAB — C DIFFICILE QUICK SCREEN W PCR REFLEX
C Diff antigen: NEGATIVE
C Diff interpretation: NOT DETECTED
C Diff toxin: NEGATIVE

## 2021-09-02 MED ORDER — TRAMADOL HCL 50 MG PO TABS
50.0000 mg | ORAL_TABLET | Freq: Four times a day (QID) | ORAL | 0 refills | Status: DC | PRN
Start: 2021-09-02 — End: 2023-10-28

## 2021-09-02 NOTE — Progress Notes (Signed)
[place]   Telephone:([phone]) [phone] Fax:(336) [phone]   Clinic Follow up Note   Patient Care Team: Billie Ruddy, MD as PCP - General (Family Medicine) Erroll Luna, MD as Consulting Physician (General Surgery) Truitt Merle, MD as Consulting Physician (Hematology) Gery Pray, MD as Consulting Physician (Radiation Oncology)  Date of Service:  09/02/2021  CHIEF COMPLAINT: f/u of left breast cancer  CURRENT THERAPY:  PENDING Neoadjuvant chemotherapy  ASSESSMENT & PLAN:  Cheyenne [name] is a 41 y.o. female with   1. Enteritis -experienced severe abdominal pain (8/10) and diarrhea (7-10x a day) -PET scan on 08/31/21 showed suspected acute inflammatory process involving central mesentery and pelvis. -she then went to the ED, treated with antibiotics. -her pain is now down to a 1-2/10. She is still having diarrhea, but it is improved. I will order stool sample, will submit if viable.  2. Malignant neoplasm of lower-inner quadrant of left breast, Stage IIIA, c(T2, N1), Functionally triple negative, Grade 3 -presented with palpable left breast mass. B/l MM and left [place] on 08/07/21 showed: 2.8 cm left breast mass at 7:30; at least 4 abnormal lymph nodes. Biopsy that day confirmed IDC in breast but node was negative. -breast MRI on 08/26/21  -We recommend breast MRI with and without contrast for further evaluation, especially her lymph nodes.  She will likely need a second biopsy of the lymph node -Her breast cancer is ER 2% positive, PR and HER2 negative, this is similar to triple negative breast cancer.  We discussed high risk of recurrence after surgery. -given the size of her tumor and number of suspicious lymph nodes, the recommendation is for neoadjuvant chemotherapy with carbo Taxol for 12 weeks, followed by dose dense Adriamycin and Cytoxan every 2 weeks for 4 cycles, along with immunotherapy Keytruda for 1 year.  -given her recent enteritis, we will hold chemo at  least until she has finished her antibiotics. We will reschedule her port placement as well.   2. Social Support -she has good support from her wife. -she has a history of anxiety and is on Xanax as needed. I advised her to f/u with her counselor. -she will consider FMLA. If she chooses to work, she would like to work from home. She will bring [place] any forms she needs filled out.     PLAN:  -lab, PET and echo results reviewed and discussed  -stool sample today for c-diff, if stool is viable -I prescribed tramadol for her pain  -she is scheduled for weekly carbo/taxol and Keytruda every 3 weeks. Plan to start next week 2/8 if she recovers well. -will message Dr. Alena Bills to reschedule her port placement    No problem-specific Assessment & Plan notes found for this encounter.   SUMMARY OF ONCOLOGIC HISTORY: Oncology History Overview Note   Cancer Staging  Malignant neoplasm of lower-inner quadrant of left breast in female, estrogen receptor positive ([place]) Staging form: Breast, AJCC 8th Edition - Clinical stage from 08/07/2021: Stage IIIA (cT2, cN1, cM0, G3, ER+, PR-, HER2-) - Signed by Truitt Merle, MD on 08/18/2021    Malignant neoplasm of lower-inner quadrant of left breast in female, estrogen receptor positive ([place])  08/07/2021 Cancer Staging   Staging form: Breast, AJCC 8th Edition - Clinical stage from 08/07/2021: Stage IIB (cT2, cN0, cM0, G3, ER+, PR-, HER2-) - Signed by Truitt Merle, MD on 08/19/2021 Stage prefix: Initial diagnosis Histologic grading system: 3 grade system    08/07/2021 Mammogram   CLINICAL DATA:  Palpable  mass in the LEFT breast since August.   EXAM: DIGITAL DIAGNOSTIC BILATERAL MAMMOGRAM WITH TOMOSYNTHESIS AND CAD; ULTRASOUND LEFT BREAST LIMITED  IMPRESSION: 1. Suspicious mass in the 7:30 o'clock location of the LEFT breast for which biopsy is recommended. 2. LEFT axillary adenopathy, with at least 4 abnormal lymph nodes.   08/07/2021 Initial Biopsy   Diagnosis 1.  Breast, left, needle core biopsy, 7:30, ribbon clip - INVASIVE DUCTAL CARCINOMA - SEE COMMENT 2. Lymph node, needle/core biopsy, left axilla, coil clip - NO CARCINOMA IDENTIFIED Microscopic Comment 1. based on the biopsy, the carcinoma appears Nottingham grade 3 of 3 and measures 1.1 cm in greatest linear extent.  1. PROGNOSTIC INDICATORS Results: The tumor cells are EQUIVOCAL for Her2 (2+). Her2 by FISH will be performed and results reported separately. Estrogen Receptor: 2%, POSITIVE, WEAK STAINING INTENSITY Progesterone Receptor: <1%, NEGATIVE Proliferation Marker Ki67: 40%  1. FLUORESCENCE IN-SITU HYBRIDIZATION Results: GROUP 5: HER2 **NEGATIVE**   08/17/2021 Initial Diagnosis   Malignant neoplasm of lower-inner quadrant of left breast in female, estrogen receptor positive ([place])   08/19/2021 Genetic Testing   [facility] BRCAplus was Negative. Report date is 08/26/2021.  The BRCAplus panel offered by [employer] and includes sequencing and deletion/duplication analysis for the following 8 genes: ATM, BRCA1, BRCA2, CDH1, CHEK2, PALB2, PTEN, and TP53.   08/26/2021 Imaging   EXAM: BILATERAL BREAST MRI WITH AND WITHOUT CONTRAST  IMPRESSION: 2.3 cm mass in the lower-inner quadrant of the left breast corresponding with the biopsy proven invasive ductal carcinoma.   08/31/2021 PET scan   IMPRESSION: 1. Known mass medially in the left breast is markedly hypermetabolic consistent with breast cancer. 2. No evidence of nodal metastases within the chest. No typical distant metastases. 3. Suspected acute inflammatory process involving central mesentery and pelvis with ill-defined focal hypermetabolic activity anterior to the sacrum. There are pelvic inflammatory changes and free pelvic fluid. Recent labs demonstrate mild leukocytosis. Differential considerations include acute appendicitis, enteritis and small perforated foreign body. No evidence of drainable fluid collection, pneumoperitoneum  or bowel obstruction. 4. These results were called by telephone at the time of interpretation on 08/31/2021 at 5:12 pm to on-call oncology nurse, [name], who verbally acknowledged these results. Unless there is a good clinical explanation for these findings, evaluation in the emergency department is likely warranted, and patient may benefit from abdominopelvic CT with oral and intravenous contrast.   09/01/2021 Imaging   EXAM: CT ABDOMEN AND PELVIS WITH CONTRAST  IMPRESSION: Multiple thick-walled loops of distal small bowel, suggesting infectious/inflammatory enteritis. Associated small volume pelvic ascites. No pneumatosis or free air.   No evidence of bowel obstruction.  Normal appendix.   2.1 cm lesion in the medial left breast, likely corresponding to the patient's known primary breast neoplasm. No findings suspicious for metastatic disease.   09/02/2021 -  Chemotherapy   Patient is on Treatment Plan : BREAST Pembrolizumab (200) D1 + Carboplatin (5) D1 + Paclitaxel (80) D1,8,15 q21d X 4 cycles / Pembrolizumab (200) D1 + AC D1 q21d x 4 cycles        INTERVAL HISTORY:  Cheyenne [name] is here for a follow up of breast cancer. She was last seen by me on 08/19/21 in consultation. She presents to the clinic accompanied by her wife. She had presented to the ED on 08/31/21 with abdominal pain. She reports the pain was bad initially, rated 8+/10. She also reports she has diarrhea, up to 8 times a day. She notes some nausea but denies vomiting. They  report she was given IVF in the ED. She reports her pain is now down to a 1-2/10.   All other systems were reviewed with the patient and are negative.  MEDICAL HISTORY:  Past Medical History:  Diagnosis Date   Anal fissure    Anxiety    Breast cancer ([place]) 08/10/21   Date diagnosis was given   DVT (deep venous thrombosis) ([place])     SURGICAL HISTORY: Past Surgical History:  Procedure Laterality Date   NO PAST SURGERIES      I have  reviewed the social history and family history with the patient and they are unchanged from previous note.  ALLERGIES:  has No Known Allergies.  MEDICATIONS:  Current Outpatient Medications  Medication Sig Dispense Refill   traMADol (ULTRAM) 50 MG tablet Take 1 tablet (50 mg total) by mouth every 6 (six) hours as needed. 5 tablet 0   ALAYCEN 1/35 tablet TAKE 1 (ONE) TABLET DAILY, CONTINUOUSLY  4   albuterol (VENTOLIN HFA) 108 (90 Base) MCG/ACT inhaler Inhale 2 puffs into the lungs every 4 (four) hours as needed for wheezing. 1 each 5   ALPRAZolam (XANAX) 0.5 MG tablet Take one tablet twice daily as needed for anxiety. 45 tablet 2   amoxicillin-clavulanate (AUGMENTIN) 875-125 MG tablet Take 1 tablet by mouth every 12 (twelve) hours. 14 tablet 0   desonide (DESOWEN) 0.05 % cream Apply topically 2 (two) times daily. 30 g 1   hydroquinone 4 % cream APPLY TO DARK SPOTS AT BEDTIME 28.35 g 0   ibuprofen (ADVIL) 800 MG tablet Take 800 mg by mouth every 8 (eight) hours as needed for moderate pain.     lidocaine-prilocaine (EMLA) cream Apply to affected area once 30 g 3   Multiple Vitamins-Minerals (ONE A DAY IMMUNITY DEFENSE) CHEW Chew 1 tablet by mouth daily.     ondansetron (ZOFRAN) 8 MG tablet Take 1 tablet (8 mg total) by mouth 2 (two) times daily as needed. Start on the third day after carboplatin and AC chemotherapy. 30 tablet 1   prochlorperazine (COMPAZINE) 10 MG tablet Take 1 tablet (10 mg total) by mouth every 6 (six) hours as needed (Nausea or vomiting). 30 tablet 1   No current facility-administered medications for this visit.    PHYSICAL EXAMINATION: ECOG PERFORMANCE STATUS: 1 - Symptomatic but completely ambulatory  Vitals:   09/02/21 0842  BP: 109/64  Pulse: (!) 103  Resp: 18  Temp: 98.7 F (37.1 C)  SpO2: 99%   Wt Readings from Last 3 Encounters:  09/02/21 203 lb 6.4 oz (92.3 kg)  08/31/21 194 lb (88 kg)  08/19/21 205 lb 9.6 oz (93.3 kg)     GENERAL:alert, no distress  and comfortable SKIN: skin color, texture, turgor are normal, no rashes or significant lesions EYES: normal, Conjunctiva are pink and non-injected, sclera clear  ABDOMEN:abdomen soft, non-tender and normal bowel sounds Musculoskeletal:no cyanosis of digits and no clubbing  NEURO: alert & oriented x 3 with fluent speech, no focal motor/sensory deficits  LABORATORY DATA:  I have reviewed the data as listed CBC Latest Ref Rng & Units 08/31/2021 08/24/2021 08/19/2021  WBC 4.0 - 10.5 K/uL 12.2(H) 12.3(H) 12.2(H)  Hemoglobin 12.0 - 15.0 g/dL 13.9 13.1 13.3  Hematocrit 36.0 - 46.0 % 40.8 38.9 39.2  Platelets 150 - 400 K/uL 469(H) 440(H) 418(H)     CMP Latest Ref Rng & Units 08/31/2021 08/24/2021 08/19/2021  Glucose 70 - 99 mg/dL 156(H) 106(H) 146(H)  BUN 6 - 20 mg/dL  14 10 13   Creatinine 0.44 - 1.00 mg/dL 0.88 0.91 0.90  Sodium 135 - 145 mmol/L 132(L) 139 137  Potassium 3.5 - 5.1 mmol/L 3.1(L) 3.6 3.5  Chloride 98 - 111 mmol/L 101 104 105  CO2 22 - 32 mmol/L 18(L) 24 24  Calcium 8.9 - 10.3 mg/dL 8.8(L) 8.8(L) 8.9  Total Protein 6.5 - 8.1 g/dL 7.7 6.7 7.2  Total Bilirubin 0.3 - 1.2 mg/dL 0.5 0.5 0.4  Alkaline Phos 38 - 126 U/L 83 50 51  AST 15 - 41 U/L 25 16 12(L)  ALT 0 - 44 U/L 39 13 12      RADIOGRAPHIC STUDIES: I have personally reviewed the radiological images as listed and agreed with the findings in the report. CT ABDOMEN PELVIS W CONTRAST  Result Date: 09/01/2021 CLINICAL DATA:  Abdominal inflammatory process on PET, lower abdominal pain, leukocytosis. Newly diagnosed breast cancer EXAM: CT ABDOMEN AND PELVIS WITH CONTRAST TECHNIQUE: Multidetector CT imaging of the abdomen and pelvis was performed using the standard protocol following bolus administration of intravenous contrast. RADIATION DOSE REDUCTION: This exam was performed according to the departmental dose-optimization program which includes automated exposure control, adjustment of the mA and/or kV according to patient size  and/or use of iterative reconstruction technique. CONTRAST:  169m OMNIPAQUE IOHEXOL 300 MG/ML  SOLN COMPARISON:  PET-CT dated 08/31/2021 FINDINGS: Lower chest: Lung bases are clear. 2.1 cm lesion in the medial left breast (series 2/image 9), likely corresponding to the patient's known primary breast cancer. Hepatobiliary: Liver is within normal limits. Gallbladder is unremarkable. No intrahepatic or extrahepatic ductal dilatation. Pancreas: Within normal limits. Spleen: Within normal limits. Adrenals/Urinary Tract: Adrenal glands are within normal limits. Kidneys are within normal limits.  No hydronephrosis. Bladder is within normal limits. Stomach/Bowel: Stomach is within normal limits. Multiple thick-walled loops of distal small bowel (series 2/images 55, 58, and 65), suggesting infectious/inflammatory enteritis. Associated mesenteric stranding with mild interloop fluid in small volume pelvic ascites. No pneumatosis or free air. No evidence of bowel obstruction. Normal appendix (series 2/image 85). Vascular/Lymphatic: No evidence of abdominal aortic aneurysm. No suspicious abdominopelvic lymphadenopathy. Reproductive: Uterus is within normal limits. Bilateral ovaries are unremarkable. Other: Small volume pelvic ascites, as noted above. Musculoskeletal: No focal osseous lesions. IMPRESSION: Multiple thick-walled loops of distal small bowel, suggesting infectious/inflammatory enteritis. Associated small volume pelvic ascites. No pneumatosis or free air. No evidence of bowel obstruction.  Normal appendix. 2.1 cm lesion in the medial left breast, likely corresponding to the patient's known primary breast neoplasm. No findings suspicious for metastatic disease. Electronically Signed   By: SJulian HyM.D.   On: 09/01/2021 01:59   NM PET Image Initial (PI) Skull Base To Thigh  Result Date: 08/31/2021 CLINICAL DATA:  Initial treatment strategy for left breast cancer. EXAM: NUCLEAR MEDICINE PET SKULL BASE TO  THIGH TECHNIQUE: 10.12 mCi F-18 FDG was injected intravenously. Full-ring PET imaging was performed from the skull base to thigh after the radiotracer. CT data was obtained and used for attenuation correction and anatomic localization. Fasting blood glucose: 123 mg/dl COMPARISON:  Breast MRI 08/26/2021. FINDINGS: Mediastinal blood pool activity: SUV max 2.5 NECK: No hypermetabolic cervical lymph nodes are identified.There are no lesions of the pharyngeal mucosal space. Incidental CT findings: none CHEST: Known mass medially in the left breast is significantly hypermetabolic. This mass measures 1.5 x 2.1 cm on image 77/4 and has an SUV max of 20.3. No other hypermetabolic breast masses are identified. There are no hypermetabolic mediastinal, hilar, internal mammary  or axillary lymph nodes. Small left axillary lymph nodes are present with an SUV max of 2.2, similar to blood pool. No hypermetabolic pulmonary activity or suspicious nodularity. Incidental CT findings: none ABDOMEN/PELVIS: There is no hypermetabolic activity within the liver, adrenal glands, spleen or pancreas. There is no hypermetabolic nodal activity. There is suspicion of an inflammatory process involving the central mesentery and pelvis with ill-defined focal hypermetabolic activity within the base of the mesentery in the false pelvis. This corresponds with a 1.6 cm nodular density on image 142/4 and has an SUV max of 9.4. There is possible associated wall thickening of the terminal ileum. A normal appearing appendix is not clearly visualized. There is a moderate amount of free pelvic fluid which is without significant hypermetabolic activity. There is no pneumoperitoneum or focal drainable fluid collection. Incidental CT findings: Mild hepatic steatosis. As above, suspected acute inflammatory process in the small bowel mesentery and pelvis. SKELETON: There is no focal hypermetabolic activity to suggest osseous metastatic disease. Bone marrow activity  is mildly increased diffusely. Incidental CT findings: none IMPRESSION: 1. Known mass medially in the left breast is markedly hypermetabolic consistent with breast cancer. 2. No evidence of nodal metastases within the chest. No typical distant metastases. 3. Suspected acute inflammatory process involving central mesentery and pelvis with ill-defined focal hypermetabolic activity anterior to the sacrum. There are pelvic inflammatory changes and free pelvic fluid. Recent labs demonstrate mild leukocytosis. Differential considerations include acute appendicitis, enteritis and small perforated foreign body. No evidence of drainable fluid collection, pneumoperitoneum or bowel obstruction. 4. These results were called by telephone at the time of interpretation on 08/31/2021 at 5:12 pm to on-call oncology nurse, [name], who verbally acknowledged these results. Unless there is a good clinical explanation for these findings, evaluation in the emergency department is likely warranted, and patient may benefit from abdominopelvic CT with oral and intravenous contrast. Electronically Signed   By: Richardean Sale M.D.   On: 08/31/2021 17:15      Orders Placed This Encounter  Procedures   C difficile quick screen w PCR reflex    Standing Status:   Future    Number of Occurrences:   1    Standing Expiration Date:   09/02/2022   All questions were answered. The patient knows to call the clinic with any problems, questions or concerns. No barriers to learning was detected. The total time spent in the appointment was 30 minutes.     Truitt Merle, MD 09/02/2021   I, Wilburn Mylar, am acting as scribe for Truitt Merle, MD.   I have reviewed the above documentation for accuracy and completeness, and I agree with the above.

## 2021-09-03 ENCOUNTER — Encounter (HOSPITAL_COMMUNITY): Payer: Self-pay | Admitting: Surgery

## 2021-09-03 NOTE — Progress Notes (Signed)
For Short Stay: [place] appointment date: Date of COVID positive in last 62 days:  Bowel Prep reminder:   For Anesthesia: PCP - Billie Ruddy, MD Cardiologist -   Chest x-ray - greater than 2 years in epic EKG - 09/01/21 in epic Stress Test -  ECHO - 08/28/21 in epic Cardiac Cath -  Pacemaker/ICD device last checked: Pacemaker orders received: Device Rep notified:  Spinal Cord Stimulator:  Sleep Study -  CPAP -   Fasting Blood Sugar -  Checks Blood Sugar _____ times a day Date and result of last Hgb A1c-  Blood Thinner Instructions: Aspirin Instructions: Last Dose:  Activity level: Can go up a flight of stairs and activities of daily living without stopping and without chest pain and/or shortness of breath   Able to exercise without chest pain and/or shortness of breath   Unable to go up a flight of stairs without chest pain and/or shortness of breath     Anesthesia review:   Patient denies shortness of breath, fever, cough and chest pain at PAT appointment   Patient verbalized understanding of instructions that were given to them at the PAT appointment. Patient was also instructed that they will need to review over the PAT instructions again at home before surgery.

## 2021-09-04 ENCOUNTER — Telehealth: Payer: Self-pay | Admitting: Genetic Counselor

## 2021-09-04 ENCOUNTER — Other Ambulatory Visit (HOSPITAL_COMMUNITY): Payer: Federal, State, Local not specified - PPO

## 2021-09-04 ENCOUNTER — Telehealth (INDEPENDENT_AMBULATORY_CARE_PROVIDER_SITE_OTHER): Payer: Federal, State, Local not specified - PPO | Admitting: Adult Health

## 2021-09-04 ENCOUNTER — Other Ambulatory Visit: Payer: Self-pay

## 2021-09-04 ENCOUNTER — Encounter: Payer: Self-pay | Admitting: Adult Health

## 2021-09-04 DIAGNOSIS — F411 Generalized anxiety disorder: Secondary | ICD-10-CM | POA: Diagnosis not present

## 2021-09-04 MED ORDER — ALPRAZOLAM 0.5 MG PO TABS: ORAL_TABLET | ORAL | 2 refills | Status: DC

## 2021-09-04 NOTE — Progress Notes (Signed)
Cheyenne [name] [id number] June 19, 1981 41 y.o.  Virtual Visit via Video Note  I connected with pt @ on 09/04/21 at  8:20 AM EST by a video enabled telemedicine application and verified that I am speaking with the correct person using two identifiers.   I discussed the limitations of evaluation and management by telemedicine and the availability of in person appointments. The patient expressed understanding and agreed to proceed.  I discussed the assessment and treatment plan with the patient. The patient was provided an opportunity to ask questions and all were answered. The patient agreed with the plan and demonstrated an understanding of the instructions.   The patient was advised to call back or seek an in-person evaluation if the symptoms worsen or if the condition fails to improve as anticipated.  I provided 10 minutes of non-face-to-face time during this encounter.  The patient was located at home.  The provider was located at [facility].   Dorothyann Gibbs, NP   Subjective:   Patient ID:  Cheyenne [name] is a 41 y.o. (DOB 11/22/1980) female.  Chief Complaint: No chief complaint on file.   HPI Cheyenne [name] presents for follow-up of GAD   Describes mood today as "ok". Pleasant. Tearful at times. Recently diagnosed with breast cancer. Mood symptoms - reports some depression, but better now. Feels anxious and irritable. Increased wory and rumination. Reports a few panic attacks after learning about diagnosis. Using Xanax as needed - "not every day". Stable interest and motivation. Taking medications as prescribed.  Energy levels lower. Active, does not have a regular exercise routine.  Enjoys some usual interests and activities. Married 6 years. Lives with wife. No children. Spending time with family. Appetite adequate. Weight loss - 199 from 206 pounds.  Sleeps well most nights. Averages 7 to 8 hours - up and down during the night. Focus and concentration  "all over the place. Completing tasks. Managing aspects of household. Works full time for [employer] - 15 years - out of work currently. Denies SI or HI.  Denies AH or VH.  Previous medication trials:  Xanax   Review of Systems:  Review of Systems  Musculoskeletal:  Negative for gait problem.  Neurological:  Negative for tremors.  Psychiatric/Behavioral:         Please refer to HPI   Medications: I have reviewed the patient's current medications.  Current Outpatient Medications  Medication Sig Dispense Refill   ALAYCEN 1/35 tablet TAKE 1 (ONE) TABLET DAILY, CONTINUOUSLY  4   albuterol (VENTOLIN HFA) 108 (90 Base) MCG/ACT inhaler Inhale 2 puffs into the lungs every 4 (four) hours as needed for wheezing. 1 each 5   ALPRAZolam (XANAX) 0.5 MG tablet Take one tablet twice daily as needed for anxiety. 45 tablet 2   amoxicillin-clavulanate (AUGMENTIN) 875-125 MG tablet Take 1 tablet by mouth every 12 (twelve) hours. 14 tablet 0   desonide (DESOWEN) 0.05 % cream Apply topically 2 (two) times daily. 30 g 1   hydroquinone 4 % cream APPLY TO DARK SPOTS AT BEDTIME 28.35 g 0   ibuprofen (ADVIL) 800 MG tablet Take 800 mg by mouth every 8 (eight) hours as needed for moderate pain.     lidocaine-prilocaine (EMLA) cream Apply to affected area once 30 g 3   Multiple Vitamins-Minerals (ONE A DAY IMMUNITY DEFENSE) CHEW Chew 1 tablet by mouth daily.     ondansetron (ZOFRAN) 8 MG tablet Take 1 tablet (8 mg total) by mouth 2 (two) times daily  as needed. Start on the third day after carboplatin and AC chemotherapy. 30 tablet 1   prochlorperazine (COMPAZINE) 10 MG tablet Take 1 tablet (10 mg total) by mouth every 6 (six) hours as needed (Nausea or vomiting). 30 tablet 1   traMADol (ULTRAM) 50 MG tablet Take 1 tablet (50 mg total) by mouth every 6 (six) hours as needed. 5 tablet 0   No current facility-administered medications for this visit.    Medication Side Effects: None  Allergies: No Known  Allergies  Past Medical History:  Diagnosis Date   Anal fissure    Anxiety    Breast cancer (HCC) 08/10/21   Date diagnosis was given   DVT (deep venous thrombosis) (HCC)    Incomplete right bundle branch block (RBBB) determined by electrocardiography 09/01/2021    Family History  Problem Relation Age of Onset   Hypertension Mother    Edema Mother    Diabetes Mother    Obesity Mother    Diabetes Sister    Obesity Sister    Breast cancer Other 60   Breast cancer Other 56       dx. with breast cancer again at age 43 (unsure if recurrence or second primary)   Colon cancer Neg Hx    Esophageal cancer Neg Hx    Rectal cancer Neg Hx     Social History   Socioeconomic History   Marital status: Married    Spouse name: Not on file   Number of children: 0   Years of education: Not on file   Highest education level: Not on file  Occupational History   Occupation: Claims Rep  Tobacco Use   Smoking status: Never   Smokeless tobacco: Never  Vaping Use   Vaping Use: Never used  Substance and Sexual Activity   Alcohol use: Yes    Comment: Occasionally   Drug use: No   Sexual activity: Yes    Partners: Female  Other Topics Concern   Not on file  Social History Narrative   Married. Education: [employer]. Exercise: No.   Social Determinants of Health   Financial Resource Strain: Not on file  Food Insecurity: No Food Insecurity   Worried About Programme researcher, broadcasting/film/video in the Last Year: Never true   Ran Out of Food in the Last Year: Never true  Transportation Needs: No Transportation Needs   Lack of Transportation (Medical): No   Lack of Transportation (Non-Medical): No  Physical Activity: Not on file  Stress: Not on file  Social Connections: Not on file  Intimate Partner Violence: Not on file    Past Medical History, Surgical history, Social history, and Family history were reviewed and updated as appropriate.   Please see review of systems for further details on the  patient's review from today.   Objective:   Physical Exam:  There were no vitals taken for this visit.  Physical Exam Constitutional:      General: She is not in acute distress. Musculoskeletal:        General: No deformity.  Neurological:     Mental Status: She is alert and oriented to person, place, and time.     Coordination: Coordination normal.  Psychiatric:        Attention and Perception: Attention and perception normal. She does not perceive auditory or visual hallucinations.        Mood and Affect: Mood normal. Mood is not anxious or depressed. Affect is not labile, blunt, angry or inappropriate.  Speech: Speech normal.        Behavior: Behavior normal.        Thought Content: Thought content normal. Thought content is not paranoid or delusional. Thought content does not include homicidal or suicidal ideation. Thought content does not include homicidal or suicidal plan.        Cognition and Memory: Cognition and memory normal.        Judgment: Judgment normal.     Comments: Insight intact    Lab Review:     Component Value Date/Time   NA 132 (L) 08/31/2021 1957   K 3.1 (L) 08/31/2021 1957   CL 101 08/31/2021 1957   CO2 18 (L) 08/31/2021 1957   GLUCOSE 156 (H) 08/31/2021 1957   BUN 14 08/31/2021 1957   CREATININE 0.88 08/31/2021 1957   CREATININE 0.90 08/19/2021 0820   CREATININE 0.88 04/02/2020 0913   CALCIUM 8.8 (L) 08/31/2021 1957   PROT 7.7 08/31/2021 1957   ALBUMIN 3.2 (L) 08/31/2021 1957   AST 25 08/31/2021 1957   AST 12 (L) 08/19/2021 0820   ALT 39 08/31/2021 1957   ALT 12 08/19/2021 0820   ALKPHOS 83 08/31/2021 1957   BILITOT 0.5 08/31/2021 1957   BILITOT 0.4 08/19/2021 0820   GFRNONAA >60 08/31/2021 1957   GFRNONAA >60 08/19/2021 0820   GFRNONAA 83 04/02/2020 0913   GFRAA 97 04/02/2020 0913       Component Value Date/Time   WBC 12.2 (H) 08/31/2021 1957   RBC 4.72 08/31/2021 1957   HGB 13.9 08/31/2021 1957   HGB 13.3 08/19/2021 0820    HCT 40.8 08/31/2021 1957   PLT 469 (H) 08/31/2021 1957   PLT 418 (H) 08/19/2021 0820   MCV 86.4 08/31/2021 1957   MCV 89.6 04/10/2013 1337   MCH 29.4 08/31/2021 1957   MCHC 34.1 08/31/2021 1957   RDW 12.7 08/31/2021 1957   LYMPHSABS 2.6 08/31/2021 1957   MONOABS 1.2 (H) 08/31/2021 1957   EOSABS 0.1 08/31/2021 1957   BASOSABS 0.1 08/31/2021 1957    No results found for: POCLITH, LITHIUM   No results found for: PHENYTOIN, PHENOBARB, VALPROATE, CBMZ   .res Assessment: Plan:    Plan:  PDMP reviewed  Xanax 0.5mg  daily for anxiety  RTC 6 months - will call in 3 months for a refill - no appt for 6 months cancer treatment.  Patient advised to contact office with any questions, adverse effects, or acute worsening in signs and symptoms.  Discussed potential benefits, risk, and side effects of benzodiazepines to include potential risk of tolerance and dependence, as well as possible drowsiness.  Advised patient not to drive if experiencing drowsiness and to take lowest possible effective dose to minimize risk of dependence and tolerance.    There are no diagnoses linked to this encounter.   Please see After Visit Summary for patient specific instructions.  Future Appointments  Date Time Provider Department Center  09/04/2021  8:20 AM Ola Fawver, Thereasa Solo, NP [facility] None  09/09/2021 12:00 PM [facility] FLUSH [facility] None  09/09/2021 12:40 PM Malachy Mood, MD [facility] None  09/09/2021  2:00 PM [facility] INFUSION [facility] None  09/16/2021 12:15 PM [facility] FLUSH [facility] None  09/16/2021  1:00 PM [facility] INFUSION [facility] None  09/17/2021  3:00 PM [facility] FLUSH [facility] None  09/18/2021  3:00 PM [facility] FLUSH [facility] None  09/19/2021 12:00 PM [facility] FLUSH [facility] None  09/25/2021 10:00 AM Zavala, Michelle E, LCSW [facility] None    No  orders of the defined types were placed in this encounter.     -------------------------------

## 2021-09-04 NOTE — Telephone Encounter (Signed)
I attempted to contact Cheyenne [name] to discuss her genetic testing results (77 genes). I left a voicemail requesting she call me back at [phone].  Lucille Passy, MS, [facility] Genetic Counselor [email or web address] (P) [phone]

## 2021-09-05 ENCOUNTER — Encounter: Payer: Self-pay | Admitting: Hematology

## 2021-09-07 ENCOUNTER — Telehealth: Payer: Self-pay | Admitting: Genetic Counselor

## 2021-09-07 NOTE — Telephone Encounter (Signed)
I contacted Cheyenne [name] to discuss her genetic testing results. No pathogenic variants were identified in the 77 genes analyzed. Detailed clinic note to follow.  The test report has been scanned into EPIC and is located under the Molecular Pathology section of the Results Review tab.  A portion of the result report is included below for reference.   Cheyenne Passy, MS, [facility] Genetic Counselor [name] (P) [phone]

## 2021-09-08 ENCOUNTER — Ambulatory Visit (HOSPITAL_COMMUNITY): Payer: Federal, State, Local not specified - PPO

## 2021-09-08 ENCOUNTER — Other Ambulatory Visit: Payer: Self-pay

## 2021-09-08 ENCOUNTER — Ambulatory Visit (HOSPITAL_COMMUNITY): Payer: Federal, State, Local not specified - PPO | Admitting: Certified Registered"

## 2021-09-08 ENCOUNTER — Encounter (HOSPITAL_COMMUNITY)
Admission: RE | Disposition: A | Discharge status: Home / Self Care | Payer: Self-pay | Source: Home / Self Care | Attending: Surgery

## 2021-09-08 ENCOUNTER — Encounter (HOSPITAL_COMMUNITY): Payer: Self-pay | Admitting: Surgery

## 2021-09-08 ENCOUNTER — Ambulatory Visit (HOSPITAL_COMMUNITY)
Admission: RE | Admit: 2021-09-08 | Discharge: 2021-09-08 | Disposition: A | Discharge status: Home / Self Care | Payer: Federal, State, Local not specified - PPO | Attending: Surgery | Admitting: Surgery

## 2021-09-08 DIAGNOSIS — Z452 Encounter for adjustment and management of vascular access device: Secondary | ICD-10-CM | POA: Insufficient documentation

## 2021-09-08 DIAGNOSIS — F419 Anxiety disorder, unspecified: Secondary | ICD-10-CM | POA: Insufficient documentation

## 2021-09-08 DIAGNOSIS — Z86718 Personal history of other venous thrombosis and embolism: Secondary | ICD-10-CM | POA: Insufficient documentation

## 2021-09-08 DIAGNOSIS — Z95828 Presence of other vascular implants and grafts: Secondary | ICD-10-CM

## 2021-09-08 DIAGNOSIS — C50312 Malignant neoplasm of lower-inner quadrant of left female breast: Secondary | ICD-10-CM | POA: Insufficient documentation

## 2021-09-08 DIAGNOSIS — Z79899 Other long term (current) drug therapy: Secondary | ICD-10-CM | POA: Insufficient documentation

## 2021-09-08 HISTORY — PX: PORTACATH PLACEMENT: SHX2246

## 2021-09-08 LAB — PREGNANCY, URINE: Preg Test, Ur: NEGATIVE

## 2021-09-08 SURGERY — INSERTION, TUNNELED CENTRAL VENOUS DEVICE, WITH PORT
Anesthesia: General

## 2021-09-08 MED ORDER — MIDAZOLAM HCL 2 MG/2ML IJ SOLN
INTRAMUSCULAR | Status: DC | PRN
  Administered 2021-09-08: 2 mg via INTRAVENOUS

## 2021-09-08 MED ORDER — BUPIVACAINE-EPINEPHRINE 0.25% -1:200000 IJ SOLN
INTRAMUSCULAR | Status: DC | PRN
Start: 2021-09-08 — End: 2021-09-08
  Administered 2021-09-08: 10 mL

## 2021-09-08 MED ORDER — BUPIVACAINE-EPINEPHRINE (PF) 0.25% -1:200000 IJ SOLN
INTRAMUSCULAR | Status: AC
  Filled 2021-09-08: qty 30

## 2021-09-08 MED ORDER — IBUPROFEN 800 MG PO TABS: 800.0000 mg | ORAL_TABLET | Freq: Three times a day (TID) | ORAL | 0 refills | Status: AC | PRN

## 2021-09-08 MED ORDER — ONDANSETRON HCL 4 MG/2ML IJ SOLN
INTRAMUSCULAR | Status: AC
  Filled 2021-09-08: qty 6

## 2021-09-08 MED ORDER — LACTATED RINGERS IV SOLN: INTRAVENOUS | Status: DC

## 2021-09-08 MED ORDER — ONDANSETRON HCL 4 MG/2ML IJ SOLN
INTRAMUSCULAR | Status: DC | PRN
  Administered 2021-09-08: 4 mg via INTRAVENOUS

## 2021-09-08 MED ORDER — OXYCODONE HCL 5 MG PO TABS: 5.0000 mg | ORAL_TABLET | Freq: Once | ORAL | Status: DC | PRN

## 2021-09-08 MED ORDER — OXYCODONE HCL 5 MG PO TABS: 5.0000 mg | ORAL_TABLET | Freq: Four times a day (QID) | ORAL | 0 refills | Status: DC | PRN

## 2021-09-08 MED ORDER — PROMETHAZINE HCL 25 MG/ML IJ SOLN: 6.2500 mg | INTRAMUSCULAR | Status: DC | PRN

## 2021-09-08 MED ORDER — SUCCINYLCHOLINE CHLORIDE 200 MG/10ML IV SOSY
PREFILLED_SYRINGE | INTRAVENOUS | Status: AC
  Filled 2021-09-08: qty 10

## 2021-09-08 MED ORDER — CEFAZOLIN SODIUM-DEXTROSE 2-4 GM/100ML-% IV SOLN
2.0000 g | INTRAVENOUS | Status: AC
  Administered 2021-09-08: 2 g via INTRAVENOUS
  Filled 2021-09-08: qty 100

## 2021-09-08 MED ORDER — ROCURONIUM BROMIDE 10 MG/ML (PF) SYRINGE
PREFILLED_SYRINGE | INTRAVENOUS | Status: AC
  Filled 2021-09-08: qty 10

## 2021-09-08 MED ORDER — FENTANYL CITRATE (PF) 100 MCG/2ML IJ SOLN
INTRAMUSCULAR | Status: AC
  Filled 2021-09-08: qty 2

## 2021-09-08 MED ORDER — ACETAMINOPHEN 500 MG PO TABS
1000.0000 mg | ORAL_TABLET | ORAL | Status: AC
  Administered 2021-09-08: 1000 mg via ORAL
  Filled 2021-09-08: qty 2

## 2021-09-08 MED ORDER — HEPARIN SOD (PORK) LOCK FLUSH 100 UNIT/ML IV SOLN
INTRAVENOUS | Status: AC
  Filled 2021-09-08: qty 5

## 2021-09-08 MED ORDER — LIDOCAINE HCL (PF) 2 % IJ SOLN
INTRAMUSCULAR | Status: AC
  Filled 2021-09-08: qty 15

## 2021-09-08 MED ORDER — CHLORHEXIDINE GLUCONATE CLOTH 2 % EX PADS: 6.0000 | MEDICATED_PAD | Freq: Once | CUTANEOUS | Status: DC

## 2021-09-08 MED ORDER — HEPARIN 6000 UNIT IRRIGATION SOLUTION
Freq: Once | Status: AC
  Administered 2021-09-08: 1
  Filled 2021-09-08: qty 6000

## 2021-09-08 MED ORDER — HEPARIN SOD (PORK) LOCK FLUSH 100 UNIT/ML IV SOLN
INTRAVENOUS | Status: DC | PRN
  Administered 2021-09-08: 500 [IU]

## 2021-09-08 MED ORDER — LIDOCAINE 2% (20 MG/ML) 5 ML SYRINGE
INTRAMUSCULAR | Status: DC | PRN
  Administered 2021-09-08: 40 mg via INTRAVENOUS
  Administered 2021-09-08: 60 mg via INTRAVENOUS

## 2021-09-08 MED ORDER — PHENYLEPHRINE 40 MCG/ML (10ML) SYRINGE FOR IV PUSH (FOR BLOOD PRESSURE SUPPORT)
PREFILLED_SYRINGE | INTRAVENOUS | Status: DC | PRN
  Administered 2021-09-08: 160 ug via INTRAVENOUS
  Administered 2021-09-08 (×2): 80 ug via INTRAVENOUS

## 2021-09-08 MED ORDER — PHENYLEPHRINE 40 MCG/ML (10ML) SYRINGE FOR IV PUSH (FOR BLOOD PRESSURE SUPPORT)
PREFILLED_SYRINGE | INTRAVENOUS | Status: AC
  Filled 2021-09-08: qty 20

## 2021-09-08 MED ORDER — DEXAMETHASONE SODIUM PHOSPHATE 10 MG/ML IJ SOLN
INTRAMUSCULAR | Status: AC
  Filled 2021-09-08: qty 1

## 2021-09-08 MED ORDER — FENTANYL CITRATE (PF) 100 MCG/2ML IJ SOLN
INTRAMUSCULAR | Status: DC | PRN
  Administered 2021-09-08: 50 ug via INTRAVENOUS
  Administered 2021-09-08: 100 ug via INTRAVENOUS
  Administered 2021-09-08: 50 ug via INTRAVENOUS

## 2021-09-08 MED ORDER — PROPOFOL 10 MG/ML IV BOLUS
INTRAVENOUS | Status: DC | PRN
  Administered 2021-09-08: 180 mg via INTRAVENOUS
  Administered 2021-09-08: 20 mg via INTRAVENOUS

## 2021-09-08 MED ORDER — LACTATED RINGERS IV SOLN: INTRAVENOUS | Status: DC | PRN

## 2021-09-08 MED ORDER — MIDAZOLAM HCL 2 MG/2ML IJ SOLN
INTRAMUSCULAR | Status: AC
  Filled 2021-09-08: qty 2

## 2021-09-08 MED ORDER — FENTANYL CITRATE PF 50 MCG/ML IJ SOSY: 25.0000 ug | PREFILLED_SYRINGE | INTRAMUSCULAR | Status: DC | PRN

## 2021-09-08 MED ORDER — OXYCODONE HCL 5 MG/5ML PO SOLN: 5.0000 mg | Freq: Once | ORAL | Status: DC | PRN

## 2021-09-08 SURGICAL SUPPLY — 47 items
ADH SKN CLS APL DERMABOND .7 (GAUZE/BANDAGES/DRESSINGS) ×1
APL SKNCLS STERI-STRIP NONHPOA (GAUZE/BANDAGES/DRESSINGS) ×1
BAG COUNTER SPONGE SURGICOUNT (BAG) IMPLANT
BAG DECANTER FOR FLEXI CONT (MISCELLANEOUS) ×2 IMPLANT
BAG SPNG CNTER NS LX DISP (BAG)
BENZOIN TINCTURE PRP APPL 2/3 (GAUZE/BANDAGES/DRESSINGS) ×2 IMPLANT
BLADE HEX COATED 2.75 (ELECTRODE) ×2 IMPLANT
BLADE SURG 15 STRL LF DISP TIS (BLADE) ×1 IMPLANT
BLADE SURG 15 STRL SS (BLADE) ×2
DERMABOND ADVANCED (GAUZE/BANDAGES/DRESSINGS) ×1
DERMABOND ADVANCED .7 DNX12 (GAUZE/BANDAGES/DRESSINGS) ×1 IMPLANT
DRAPE C-ARM 42X120 X-RAY (DRAPES) ×2 IMPLANT
DRAPE LAPAROSCOPIC ABDOMINAL (DRAPES) ×2 IMPLANT
DRSG TEGADERM 2-3/8X2-3/4 SM (GAUZE/BANDAGES/DRESSINGS) IMPLANT
DRSG TEGADERM 4X4.75 (GAUZE/BANDAGES/DRESSINGS) ×1 IMPLANT
ELECT REM PT RETURN 15FT ADLT (MISCELLANEOUS) ×2 IMPLANT
GAUZE 4X4 16PLY ~~LOC~~+RFID DBL (SPONGE) ×2 IMPLANT
GAUZE SPONGE 2X2 8PLY STRL LF (GAUZE/BANDAGES/DRESSINGS) IMPLANT
GAUZE SPONGE 4X4 12PLY STRL (GAUZE/BANDAGES/DRESSINGS) ×3 IMPLANT
GLOVE SURG NEOPR MICRO LF SZ8 (GLOVE) ×2 IMPLANT
GLOVE SURG UNDER LTX SZ8 (GLOVE) ×4 IMPLANT
GLOVE SURG UNDER POLY LF SZ7 (GLOVE) ×2 IMPLANT
GOWN STRL REUS W/TWL LRG LVL3 (GOWN DISPOSABLE) ×2 IMPLANT
GOWN STRL REUS W/TWL XL LVL3 (GOWN DISPOSABLE) ×4 IMPLANT
KIT BASIN OR (CUSTOM PROCEDURE TRAY) ×2 IMPLANT
KIT PORT POWER 8FR ISP CVUE (Port) ×1 IMPLANT
KIT TURNOVER KIT A (KITS) IMPLANT
NDL HYPO 25X1 1.5 SAFETY (NEEDLE) ×1 IMPLANT
NEEDLE HYPO 25X1 1.5 SAFETY (NEEDLE) ×2 IMPLANT
NS IRRIG 1000ML POUR BTL (IV SOLUTION) ×2 IMPLANT
PACK BASIC VI WITH GOWN DISP (CUSTOM PROCEDURE TRAY) ×2 IMPLANT
PENCIL SMOKE EVACUATOR (MISCELLANEOUS) IMPLANT
SPIKE FLUID TRANSFER (MISCELLANEOUS) ×2 IMPLANT
SPONGE GAUZE 2X2 STER 10/PKG (GAUZE/BANDAGES/DRESSINGS)
STRIP CLOSURE SKIN 1/2X4 (GAUZE/BANDAGES/DRESSINGS) IMPLANT
SUT MNCRL AB 4-0 PS2 18 (SUTURE) ×2 IMPLANT
SUT NOVA NAB DX-16 0-1 5-0 T12 (SUTURE) IMPLANT
SUT PROLENE 2 0 SH DA (SUTURE) ×2 IMPLANT
SUT SILK 2 0 (SUTURE)
SUT SILK 2-0 30XBRD TIE 12 (SUTURE) IMPLANT
SUT VIC AB 3-0 SH 27 (SUTURE)
SUT VIC AB 3-0 SH 27XBRD (SUTURE) IMPLANT
SUT VIC AB 3-0 SH 8-18 (SUTURE) ×1 IMPLANT
SYR 10ML LL (SYRINGE) ×2 IMPLANT
SYR BULB IRRIG 60ML STRL (SYRINGE) IMPLANT
SYR CONTROL 10ML LL (SYRINGE) ×2 IMPLANT
TOWEL OR 17X26 10 PK STRL BLUE (TOWEL DISPOSABLE) ×2 IMPLANT

## 2021-09-08 NOTE — Discharge Instructions (Signed)
PORT-A-CATH: POST OP INSTRUCTIONS  Always review your discharge instruction sheet given to you by the facility where your surgery was performed.   A prescription for pain medication may be given to you upon discharge. Take your pain medication as prescribed, if needed. If narcotic pain medicine is not needed, then you make take acetaminophen (Tylenol) or ibuprofen (Advil) as needed.  Take your usually prescribed medications unless otherwise directed. If you need a refill on your pain medication, please contact our office. All narcotic pain medicine now requires a paper prescription.  Phoned in and fax refills are no longer allowed by law.  Prescriptions will not be filled after 5 pm or on weekends.  You should follow a light diet for the remainder of the day after your procedure. Most patients will experience some mild swelling and/or bruising in the area of the incision. It may take several days to resolve. It is common to experience some constipation if taking pain medication after surgery. Increasing fluid intake and taking a stool softener (such as Colace) will usually help or prevent this problem from occurring. A mild laxative (Milk of Magnesia or Miralax) should be taken according to package directions if there are no bowel movements after 48 hours.  Unless discharge instructions indicate otherwise, you may remove your bandages 48 hours after surgery, and you may shower at that time. You may have steri-strips (small white skin tapes) in place directly over the incision.  These strips should be left on the skin for 7-10 days.  If your surgeon used Dermabond (skin glue) on the incision, you may shower in 24 hours.  The glue will flake off over the next 2-3 weeks.  If your port is left accessed at the end of surgery (needle left in port), the dressing cannot get wet and should only by changed by a healthcare professional. When the port is no longer accessed (when the needle has been removed), follow  step 7.   ACTIVITIES:  Limit activity involving your arms for the next 72 hours. Do no strenuous exercise or activity for 1 week. You may drive when you are no longer taking prescription pain medication, you can comfortably wear a seatbelt, and you can maneuver your car. 10.You may need to see your doctor in the office for a follow-up appointment.  Please       check with your doctor.  11.When you receive a new Port-a-Cath, you will get a product guide and        ID card.  Please keep them in case you need them.  WHEN TO CALL YOUR DOCTOR ([phone]): Fever over 101.0 Chills Continued bleeding from incision Increased redness and tenderness at the site Shortness of breath, difficulty breathing   The clinic staff is available to answer your questions during regular business hours. Please don't hesitate to call and ask to speak to one of the nurses or medical assistants for clinical concerns. If you have a medical emergency, go to the nearest emergency room or call 911.  A surgeon from [facility] is always on call at the hospital.     For further information, please visit [email or web address]      

## 2021-09-08 NOTE — Transfer of Care (Signed)
Immediate Anesthesia Transfer of Care Note  Patient: Cheyenne [name]  Procedure(s) Performed: INSERTION PORT-A-CATH  Patient Location: PACU  Anesthesia Type:General  Level of Consciousness: sedated  Airway & Oxygen Therapy: Patient Spontanous Breathing and Patient connected to face mask oxygen  Post-op Assessment: Report given to RN and Post -op Vital signs reviewed and stable  Post vital signs: Reviewed and stable  Last Vitals:  Vitals Value Taken Time  BP 117/65 09/08/21 1137  Temp    Pulse    Resp 16 09/08/21 1138  SpO2    Vitals shown include unvalidated device data.  Last Pain:  Vitals:   09/08/21 0845  TempSrc: Oral  PainSc: 0-No pain         Complications: No notable events documented.

## 2021-09-08 NOTE — Anesthesia Postprocedure Evaluation (Signed)
Anesthesia Post Note  Patient: Cheyenne [name]  Procedure(s) Performed: INSERTION PORT-A-CATH     Patient location during evaluation: PACU Anesthesia Type: General Level of consciousness: awake and alert Pain management: pain level controlled Vital Signs Assessment: post-procedure vital signs reviewed and stable Respiratory status: spontaneous breathing, nonlabored ventilation, respiratory function stable and patient connected to nasal cannula oxygen Cardiovascular status: blood pressure returned to baseline and stable Postop Assessment: no apparent nausea or vomiting Anesthetic complications: no   No notable events documented.  Last Vitals:  Vitals:   09/08/21 1210 09/08/21 1215  BP: 116/76 115/80  Pulse: 82 83  Resp: 18 16  Temp:  36.7 C  SpO2: 99% 100%    Last Pain:  Vitals:   09/08/21 1215  TempSrc:   PainSc: 1                  Ramal Eckhardt P Klinton Candelas

## 2021-09-08 NOTE — Anesthesia Procedure Notes (Addendum)
Procedure Name: LMA Insertion Date/Time: 09/08/2021 10:30 AM Performed by: Cynda Familia, CRNA Pre-anesthesia Checklist: Patient identified, Emergency Drugs available, Suction available and Patient being monitored Patient Re-evaluated:Patient Re-evaluated prior to induction Oxygen Delivery Method: Circle System Utilized Preoxygenation: Pre-oxygenation with 100% oxygen Induction Type: IV induction Ventilation: Mask ventilation without difficulty LMA: LMA inserted and LMA with gastric port inserted LMA Size: 4.0 Number of attempts: 1 Airway Equipment and Method: Bite block Placement Confirmation: positive ETCO2 Tube secured with: Tape Dental Injury: Teeth and Oropharynx as per pre-operative assessment  Comments: IV induction Stoltzfus  LMA insertion AM CRNA atraumatic-- slight chipped bilat front teeth- bilat BS

## 2021-09-08 NOTE — Anesthesia Preprocedure Evaluation (Addendum)
Anesthesia Evaluation  Patient identified by MRN, date of birth, ID band Patient awake    Reviewed: Allergy & Precautions, NPO status , Patient's Chart, lab work & pertinent test results  Airway Mallampati: II  TM Distance: >3 FB Neck ROM: Full    Dental no notable dental hx.    Pulmonary neg pulmonary ROS,    Pulmonary exam normal        Cardiovascular + DVT   Rhythm:Regular Rate:Normal     Neuro/Psych Anxiety negative neurological ROS     GI/Hepatic negative GI ROS, Neg liver ROS,   Endo/Other  negative endocrine ROS  Renal/GU negative Renal ROS  negative genitourinary   Musculoskeletal Breast Ca   Abdominal Normal abdominal exam  (+)   Peds  Hematology negative hematology ROS (+)   Anesthesia Other Findings   Reproductive/Obstetrics                             Anesthesia Physical Anesthesia Plan  ASA: 2  Anesthesia Plan: General   Post-op Pain Management:    Induction: Intravenous  PONV Risk Score and Plan: 3 and Ondansetron, Dexamethasone, Midazolam and Treatment may vary due to age or medical condition  Airway Management Planned: Mask and LMA  Additional Equipment: None  Intra-op Plan:   Post-operative Plan: Extubation in OR  Informed Consent: I have reviewed the patients History and Physical, chart, labs and discussed the procedure including the risks, benefits and alternatives for the proposed anesthesia with the patient or authorized representative who has indicated his/her understanding and acceptance.     Dental advisory given  Plan Discussed with: CRNA  Anesthesia Plan Comments: (Lab Results      Component                Value               Date                      WBC                      12.2 (H)            08/31/2021                HGB                      13.9                08/31/2021                HCT                      40.8                 08/31/2021                MCV                      86.4                08/31/2021                PLT                      469 (H)  08/31/2021           Lab Results      Component                Value               Date                      NA                       132 (L)             08/31/2021                K                        3.1 (L)             08/31/2021                CO2                      18 (L)              08/31/2021                GLUCOSE                  156 (H)             08/31/2021                BUN                      14                  08/31/2021                CREATININE               0.88                08/31/2021                CALCIUM                  8.8 (L)             08/31/2021                GFRNONAA                 >60                 08/31/2021           Lab Results      Component                Value               Date                      PREGTESTUR               NEGATIVE            09/01/2021          )      Anesthesia Quick Evaluation

## 2021-09-08 NOTE — H&P (Signed)
History of Present Illness: Cheyenne [name] is a 41 y.o. female who is seen today as an office consultation at the request of Dr. Pasty Arch for evaluation of Breast Cancer .   Patient seen today in the [facility] for evaluation of newly diagnosed left breast cancer. Patient states she felt a mass there for a few months. Location left breast lower inner quadrant. She underwent diagnostic and subsequent biopsy of this mass which showed a grade 3 invasive ductal carcinoma ER 2% PR less than 1% with a KIA 67 to 40% HER2/neu negative. She also had 4 abnormal nodes. One was biopsied but this appeared benign on the Pap. No family history of breast cancer. No history of nipple discharge or problem with her right breast at this point time.  Review of Systems: A complete review of systems was obtained from the patient. I have reviewed this information and discussed as appropriate with the patient. See HPI as well for other ROS.    Medical History: Past Medical History:  Diagnosis Date   Anxiety   DVT (deep venous thrombosis) (CMS-HCC)   History of cancer   There is no problem list on file for this patient.  History reviewed. No pertinent surgical history.   No Known Allergies  Current Outpatient Medications on File Prior to Visit  Medication Sig Dispense Refill   ALPRAZolam (XANAX) 0.5 MG tablet Take 0.5 mg by mouth 2 (two) times daily as needed   ALYACEN 1/35, 28, 1-35 mg-mcg tablet TAKE 1 TABLET BY MOUTH DAILY CONTINUOUSLY (NO PLACEBO TALBETS)   No current facility-administered medications on file prior to visit.   Family History  Problem Relation Age of Onset   Stroke Mother   Obesity Mother   Diabetes Mother   Obesity Sister   Diabetes Sister    Social History   Tobacco Use  Smoking Status Never  Smokeless Tobacco Never    Social History   Socioeconomic History   Marital status: Single  Tobacco Use   Smoking status: Never   Smokeless tobacco: Never  Substance and Sexual Activity    Alcohol use: Yes   Drug use: Not Currently   Objective:  There were no vitals filed for this visit.  There is no height or weight on file to calculate BMI.  Physical Exam Constitutional:  Appearance: Normal appearance.  HENT:  Head: Normocephalic.  Eyes:  General: No scleral icterus. Pupils: Pupils are equal, round, and reactive to light.  Cardiovascular:  Rate and Rhythm: Normal rate.  Pulmonary:  Effort: Pulmonary effort is normal.  Breath sounds: No stridor.  Chest:  Breasts: Right: Normal.   Abdominal:  General: There is no distension.  Musculoskeletal:  General: Normal range of motion.  Cervical back: Normal range of motion and neck supple.  Lymphadenopathy:  Upper Body:  Right upper body: No axillary or pectoral adenopathy.  Left upper body: Axillary adenopathy present. No pectoral adenopathy.  Skin: General: Skin is warm.  Neurological:  General: No focal deficit present.  Mental Status: She is alert and oriented to person, place, and time.  Psychiatric:  Mood and Affect: Mood normal.  Behavior: Behavior normal.     Labs, Imaging and Diagnostic Testing: CLINICAL DATA: Palpable mass in the LEFT breast since August.   EXAM: DIGITAL DIAGNOSTIC BILATERAL MAMMOGRAM WITH TOMOSYNTHESIS AND CAD; ULTRASOUND LEFT BREAST LIMITED   TECHNIQUE: Bilateral digital diagnostic mammography and breast tomosynthesis was performed. The images were evaluated with computer-aided detection.; Targeted ultrasound examination of the left breast was performed.  COMPARISON: 04/16/2014   ACR Breast Density Category b: There are scattered areas of fibroglandular density.   FINDINGS: RIGHT breast is negative.   An irregular mass in the LOWER INNER QUADRANT of the LEFT breast is marked with a BB as palpable. No associated microcalcifications.   On physical exam, I palpate a discrete superficial mobile firm mass in the 7:30 o'clock location of the LEFT breast.    Targeted ultrasound is performed, showing an irregular mass with spiculated margins in the 7:30 o'clock location the LEFT breast 7 centimeters from the nipple. Mass shows no internal blood flow on Doppler evaluation. No significant posterior acoustic features. Mass measures 2.8 x 1.7 x 1.6 centimeters.   Evaluation of the LEFT axilla shows at least 4 lymph nodes with abnormal morphology. Other lymph nodes with normal morphology are also present, in the mid and UPPER LEFT axilla.   IMPRESSION: 1. Suspicious mass in the 7:30 o'clock location of the LEFT breast for which biopsy is recommended. 2. LEFT axillary adenopathy, with at least 4 abnormal lymph nodes.   RECOMMENDATION: Recommend ultrasound-guided core biopsy of LEFT breast mass and LEFT axilla.   I have discussed the findings and recommendations with the patient. If applicable, a reminder letter will be sent to the patient regarding the next appointment.   BI-RADS CATEGORY 5: Highly suggestive of malignancy.     Electronically Signed By: Nolon Nations M.D. On: 08/07/2021 09:59  ADDITIONAL INFORMATION: 1. FLUORESCENCE IN-SITU HYBRIDIZATION Results: GROUP 5: HER2 **NEGATIVE** Equivocal form of amplification of the HER2 gene was detected in the IHC 2+ tissue sample received from this individual. HER2 FISH was performed by a technologist and cell imaging and analysis on the [employer]. RATIO OF HER2/CEN17 SIGNALS 1.18 AVERAGE HER2 COPY NUMBER PER CELL 1.95 The ratio of HER2/CEN 17 is within the range < 2.0 of HER2/CEN 17 and a copy number of HER2 signals per cell is <4.0. Arch Pathol Lab Med 1:1,2018 Thressa Sheller MD Pathologist, Electronic Signature ( Signed 08/12/2021) 1. PROGNOSTIC INDICATORS Results: IMMUNOHISTOCHEMICAL AND MORPHOMETRIC ANALYSIS PERFORMED MANUALLY The tumor cells are EQUIVOCAL for Her2 (2+). Her2 by FISH will be performed and results reported separately. Estrogen Receptor: 2%, POSITIVE, WEAK  STAINING INTENSITY Progesterone Receptor: <1%, NEGATIVE Proliferation Marker Ki67: 40% COMMENT: The negative hormone receptor study(ies) in this case has an internal positive control. REFERENCE RANGE ESTROGEN RECEPTOR NEGATIVE 0% 1 of 3 FINAL for Sortor, Dimitra H [phone]) ADDITIONAL INFORMATION:(continued) POSITIVE =>1% REFERENCE RANGE PROGESTERONE RECEPTOR NEGATIVE 0% POSITIVE =>1% All controls stained appropriately Thressa Sheller MD Pathologist, Electronic Signature ( Signed 08/12/2021) FINAL DIAGNOSIS Diagnosis 1. Breast, left, needle core biopsy, 7:30, ribbon clip - INVASIVE DUCTAL CARCINOMA - SEE COMMENT 2. Lymph node, needle/core biopsy, left axilla, coil clip - NO CARCINOMA IDENTIFIED Microscopic Comment 1. based on the biopsy, the carcinoma appears Nottingham grade 3 of 3 and measures 1.1 cm in greatest linear extent. Prognostic markers (ER/PR/ki-67/HER2) are pending and will be reported in an addendum. Dr. Spero Curb reviewed the case and agrees with the above diagnosis. These results were called to [facility] on August 10, 2021. Thressa Sheller MD Pathologist, Electronic Signature (Case signed 08/10/2021)  Assessment and Plan:   Diagnoses and all orders for this visit:  Breast cancer, stage 2, left (CMS-HCC)   Patient seen with medical and radiation oncology. She has essentially a triple negative left breast cancer. Recommend neoadjuvant chemotherapy. Recommend MRI for further evaluation prior to neoadjuvant chemotherapy. She may require lymph node biopsy given the abnormal  morphology noted on the ultrasound but negative core biopsy result from lymph node biopsy. She appears to be a good breast conserving candidate for neoadjuvant chemotherapy she wishes to pursue that. Port placement discussed today. Risk of bleeding, infection, pneumothorax, hemothorax, injury to mediastinal structures, death, DVT, catheter migration catheter failure as well as  catheter thrombosis discussed.  Discussed breast conserving surgery mastectomy reconstruction as options. All questions were answered after lengthy discussion of surgical options She agrees to proceed.  No follow-ups on file.  Kennieth Francois, MD

## 2021-09-08 NOTE — Anesthesia Procedure Notes (Addendum)
Date/Time: 09/08/2021 11:31 AM Performed by: Cynda Familia, CRNA Oxygen Delivery Method: Simple face mask Placement Confirmation: positive ETCO2 and breath sounds checked- equal and bilateral Dental Injury: Teeth and Oropharynx as per pre-operative assessment

## 2021-09-08 NOTE — Op Note (Signed)
Preoperative diagnosis: PAC needed FOR CHEMOTHERAPY   Postoperative diagnosis: Same  Procedure: Portacath Placement with C arm and U/S guidance   Surgeon: Turner Daniels, MD, FACS  Anesthesia: General and 0.25 % marcaine with epinephrine  Clinical History and Indications: The patient is getting ready to begin chemotherapy for her cancer. She  needs a Port-A-Cath for venous access. Risk of bleeding, infection,  Collapse lung,  Death,  DVT,  Organ injury,  Mediastinal injury,  Injury to heart,  Injury to blood vessels,  Nerves,  Migration of catheter,  Embolization of catheter and the need for more surgery.  Description of Procedure: I have seen the patient in the holding area and confirmed the plans for the procedure as noted above. I reviewed the risks and complications again and the patient has no further questions. She wishes to proceed.   The patient was then taken to the operating room. After satisfactory general  anesthesia had been obtained the upper chest and lower neck were prepped and draped as a sterile field. The timeout was done.  The right internal jugular vein  was entered under U/S guidance  and the guidewire threaded into the superior vena cava right atrial area under fluoroscopic guidance. An incision was then made on the anterior chest wall and a subcutaneous pocket fashioned for the port reservoir.  The port tubing was then brought through a subcutaneous tunnel from the port site to the guidewire site.  The port and catheter were attached, locked  and flushed. The catheter was measured and cut to appropriate length.The dilator and peel-away sheath were then advanced over the guidewire while monitoring this with fluoroscopy. The guidewire and dilator were removed and the tubing threaded to approximately 20 cm. The peel-away sheath was then removed.  She had significant dysrhythmia and the catheter was pulled into the mid SVC. This helped stop the atrial activity amd NSR was  maintained. The catheter aspirated and flushed easily. Using fluoroscopy the tip was in the  mid superior vena cava  area. It aspirated and flushed easily.   The reservoir was secured to the fascia with 2x sutures of 2-0 Prolene. A final check with fluoroscopy was done to make sure we had no kinks and good positioning of the tip of the catheter. Everything appeared to be okay. The catheter was aspirated, flushed with dilute heparin and then concentrated aqueous heparin. The port was left accessed.    The incision was then closed with interrupted 3-0 Vicryl, and 4-0 Monocryl subcuticular with Dermabond on the skin.  There were no operative complications. Estimated blood loss was minimal. All counts were correct. The patient tolerated the procedure well.  Turner Daniels, MD, FACS

## 2021-09-08 NOTE — Interval H&P Note (Signed)
History and Physical Interval Note:  09/08/2021 10:12 AM  Cheyenne [name]  has presented today for surgery, with the diagnosis of LEFT BREAST CANCER.  The various methods of treatment have been discussed with the patient and family. After consideration of risks, benefits and other options for treatment, the patient has consented to  Procedure(s): INSERTION PORT-A-CATH (N/A) as a surgical intervention.  The patient's history has been reviewed, patient examined, no change in status, stable for surgery.  I have reviewed the patient's chart and labs.  Questions were answered to the patient's satisfaction.     Adel

## 2021-09-09 ENCOUNTER — Inpatient Hospital Stay: Payer: Federal, State, Local not specified - PPO

## 2021-09-09 ENCOUNTER — Inpatient Hospital Stay: Payer: Federal, State, Local not specified - PPO | Admitting: Hematology

## 2021-09-09 ENCOUNTER — Other Ambulatory Visit: Payer: Self-pay

## 2021-09-09 ENCOUNTER — Encounter (HOSPITAL_COMMUNITY): Payer: Self-pay | Admitting: Surgery

## 2021-09-09 ENCOUNTER — Telehealth: Payer: Self-pay | Admitting: *Deleted

## 2021-09-09 VITALS — BP 126/78 | HR 92 | Temp 99.2°F | Resp 17

## 2021-09-09 VITALS — BP 125/74 | HR 102 | Temp 98.7°F | Resp 17 | Ht 62.0 in | Wt 206.2 lb

## 2021-09-09 DIAGNOSIS — Z170 Estrogen receptor positive status [ER+]: Secondary | ICD-10-CM | POA: Diagnosis not present

## 2021-09-09 DIAGNOSIS — Z5111 Encounter for antineoplastic chemotherapy: Secondary | ICD-10-CM | POA: Diagnosis not present

## 2021-09-09 DIAGNOSIS — C50312 Malignant neoplasm of lower-inner quadrant of left female breast: Secondary | ICD-10-CM

## 2021-09-09 DIAGNOSIS — Z5189 Encounter for other specified aftercare: Secondary | ICD-10-CM | POA: Diagnosis not present

## 2021-09-09 DIAGNOSIS — Z95828 Presence of other vascular implants and grafts: Secondary | ICD-10-CM

## 2021-09-09 DIAGNOSIS — Z5112 Encounter for antineoplastic immunotherapy: Secondary | ICD-10-CM | POA: Diagnosis not present

## 2021-09-09 DIAGNOSIS — Z79899 Other long term (current) drug therapy: Secondary | ICD-10-CM | POA: Diagnosis not present

## 2021-09-09 DIAGNOSIS — Z86718 Personal history of other venous thrombosis and embolism: Secondary | ICD-10-CM | POA: Diagnosis not present

## 2021-09-09 DIAGNOSIS — K529 Noninfective gastroenteritis and colitis, unspecified: Secondary | ICD-10-CM | POA: Diagnosis not present

## 2021-09-09 LAB — CBC WITH DIFFERENTIAL (CANCER CENTER ONLY)
Abs Immature Granulocytes: 0.07 10*3/uL (ref 0.00–0.07)
Basophils Absolute: 0.1 10*3/uL (ref 0.0–0.1)
Basophils Relative: 0 %
Eosinophils Absolute: 0.2 10*3/uL (ref 0.0–0.5)
Eosinophils Relative: 2 %
HCT: 34.3 % — ABNORMAL LOW (ref 36.0–46.0)
Hemoglobin: 11.5 g/dL — ABNORMAL LOW (ref 12.0–15.0)
Immature Granulocytes: 1 %
Lymphocytes Relative: 17 %
Lymphs Abs: 2.1 10*3/uL (ref 0.7–4.0)
MCH: 28.7 pg (ref 26.0–34.0)
MCHC: 33.5 g/dL (ref 30.0–36.0)
MCV: 85.5 fL (ref 80.0–100.0)
Monocytes Absolute: 0.5 10*3/uL (ref 0.1–1.0)
Monocytes Relative: 4 %
Neutro Abs: 9.7 10*3/uL — ABNORMAL HIGH (ref 1.7–7.7)
Neutrophils Relative %: 76 %
Platelet Count: 635 10*3/uL — ABNORMAL HIGH (ref 150–400)
RBC: 4.01 MIL/uL (ref 3.87–5.11)
RDW: 12.9 % (ref 11.5–15.5)
WBC Count: 12.7 10*3/uL — ABNORMAL HIGH (ref 4.0–10.5)
nRBC: 0 % (ref 0.0–0.2)

## 2021-09-09 LAB — CMP (CANCER CENTER ONLY)
ALT: 174 U/L — ABNORMAL HIGH (ref 0–44)
AST: 90 U/L — ABNORMAL HIGH (ref 15–41)
Albumin: 3.4 g/dL — ABNORMAL LOW (ref 3.5–5.0)
Alkaline Phosphatase: 123 U/L (ref 38–126)
Anion gap: 7 (ref 5–15)
BUN: 6 mg/dL (ref 6–20)
CO2: 27 mmol/L (ref 22–32)
Calcium: 8.6 mg/dL — ABNORMAL LOW (ref 8.9–10.3)
Chloride: 104 mmol/L (ref 98–111)
Creatinine: 0.92 mg/dL (ref 0.44–1.00)
GFR, Estimated: >60 mL/min (ref >60)
Glucose, Bld: 149 mg/dL — ABNORMAL HIGH (ref 70–99)
Potassium: 3.6 mmol/L (ref 3.5–5.1)
Sodium: 138 mmol/L (ref 135–145)
Total Bilirubin: 0.5 mg/dL (ref 0.3–1.2)
Total Protein: 7 g/dL (ref 6.5–8.1)

## 2021-09-09 LAB — TSH: TSH: 1.34 u[IU]/mL (ref 0.308–3.960)

## 2021-09-09 MED ORDER — DIPHENHYDRAMINE HCL 50 MG/ML IJ SOLN
25.0000 mg | Freq: Once | INTRAMUSCULAR | Status: AC
  Administered 2021-09-09: 25 mg via INTRAVENOUS
  Filled 2021-09-09: qty 1

## 2021-09-09 MED ORDER — SODIUM CHLORIDE 0.9% FLUSH
10.0000 mL | Freq: Once | INTRAVENOUS | Status: AC
  Administered 2021-09-09: 10 mL via INTRAVENOUS

## 2021-09-09 MED ORDER — SODIUM CHLORIDE 0.9 % IV SOLN
150.0000 mg | Freq: Once | INTRAVENOUS | Status: AC
  Administered 2021-09-09: 150 mg via INTRAVENOUS
  Filled 2021-09-09: qty 150

## 2021-09-09 MED ORDER — PALONOSETRON HCL INJECTION 0.25 MG/5ML
0.2500 mg | Freq: Once | INTRAVENOUS | Status: AC
  Administered 2021-09-09: 0.25 mg via INTRAVENOUS
  Filled 2021-09-09: qty 5

## 2021-09-09 MED ORDER — SODIUM CHLORIDE 0.9 % IV SOLN
200.0000 mg | Freq: Once | INTRAVENOUS | Status: AC
  Administered 2021-09-09: 200 mg via INTRAVENOUS
  Filled 2021-09-09: qty 200

## 2021-09-09 MED ORDER — SODIUM CHLORIDE 0.9 % IV SOLN
723.5000 mg | Freq: Once | INTRAVENOUS | Status: AC
  Administered 2021-09-09: 720 mg via INTRAVENOUS
  Filled 2021-09-09: qty 72

## 2021-09-09 MED ORDER — SODIUM CHLORIDE 0.9 % IV SOLN
80.0000 mg/m2 | Freq: Once | INTRAVENOUS | Status: AC
  Administered 2021-09-09: 162 mg via INTRAVENOUS
  Filled 2021-09-09: qty 27

## 2021-09-09 MED ORDER — SODIUM CHLORIDE 0.9 % IV SOLN: Freq: Once | INTRAVENOUS | Status: AC

## 2021-09-09 MED ORDER — FAMOTIDINE IN NACL 20-0.9 MG/50ML-% IV SOLN
20.0000 mg | Freq: Once | INTRAVENOUS | Status: AC
  Administered 2021-09-09: 20 mg via INTRAVENOUS
  Filled 2021-09-09: qty 50

## 2021-09-09 NOTE — Progress Notes (Signed)
Ok to treat today per Dr. Burr Medico with elevated AST and ALT.

## 2021-09-09 NOTE — Patient Instructions (Signed)
[facility]  Discharge Instructions: Thank you for choosing [facility] to provide your oncology and hematology care.   If you have a lab appointment with the [facility], please go directly to the [facility] and check in at the registration area.   Wear comfortable clothing and clothing appropriate for easy access to any Portacath or PICC line.   We strive to give you quality time with your provider. You may need to reschedule your appointment if you arrive late (15 or more minutes).  Arriving late affects you and other patients whose appointments are after yours.  Also, if you miss three or more appointments without notifying the office, you may be dismissed from the clinic at the providers discretion.      For prescription refill requests, have your pharmacy contact our office and allow 72 hours for refills to be completed.    Today you received the following chemotherapy and/or immunotherapy agents Keytruda, Carboplatin, Taxol   To help prevent nausea and vomiting after your treatment, we encourage you to take your nausea medication as directed.  BELOW ARE SYMPTOMS THAT SHOULD BE REPORTED IMMEDIATELY: *FEVER GREATER THAN 100.4 F (38 C) OR HIGHER *CHILLS OR SWEATING *NAUSEA AND VOMITING THAT IS NOT CONTROLLED WITH YOUR NAUSEA MEDICATION *UNUSUAL SHORTNESS OF BREATH *UNUSUAL BRUISING OR BLEEDING *URINARY PROBLEMS (pain or burning when urinating, or frequent urination) *BOWEL PROBLEMS (unusual diarrhea, constipation, pain near the anus) TENDERNESS IN MOUTH AND THROAT WITH OR WITHOUT PRESENCE OF ULCERS (sore throat, sores in mouth, or a toothache) UNUSUAL RASH, SWELLING OR PAIN  UNUSUAL VAGINAL DISCHARGE OR ITCHING   Items with * indicate a potential emergency and should be followed up as soon as possible or go to the Emergency Department if any problems should occur.  Please show the CHEMOTHERAPY ALERT CARD or IMMUNOTHERAPY ALERT CARD  at check-in to the Emergency Department and triage nurse.  Should you have questions after your visit or need to cancel or reschedule your appointment, please contact [facility]  Dept: [phone]  and follow the prompts.  Office hours are 8:00 a.m. to 4:30 p.m. Monday - Friday. Please note that voicemails left after 4:00 p.m. may not be returned until the following business day.  We are closed weekends and major holidays. You have access to a nurse at all times for urgent questions. Please call the main number to the clinic Dept: [phone] and follow the prompts.   For any non-urgent questions, you may also contact your provider using MyChart. We now offer e-Visits for anyone 68 and older to request care online for non-urgent symptoms. For details visit [email or web address].   Also download the MyChart app! Go to the app store, search "MyChart", open the app, select [facility], and log in with your MyChart username and password.  Due to Covid, a mask is required upon entering the hospital/clinic. If you do not have a mask, one will be given to you upon arrival. For doctor visits, patients may have 1 support person aged 35 or older with them. For treatment visits, patients cannot have anyone with them due to current Covid guidelines and our immunocompromised population.

## 2021-09-09 NOTE — Telephone Encounter (Signed)
"  My doctor needs to provide a letter that supports my cancer with medical information answering the six questions provided.  Letter should outline specific details of my condition including symptoms, nature, duration and severity.  A compromised immune system is why I requested Telework to protect myself.  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  Job description attached.    Requested Telework 5-days per week with additional breaks, schedule modifications to leave work for appointments, treatments or when feeling exhausted with low energy levels.    Letter must be received by them no later than October 02, 2021.  No form required.  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  "[employer], [employer] # [id number]" and the [id number] job description does not include a return fax number.  Returned request to provider mail bin for collaborative pick-up to complete letter requested.

## 2021-09-09 NOTE — Progress Notes (Signed)
[place]   [phone] [phone]   Clinic Follow up Note   Patient Care Team: Billie Ruddy, MD as PCP - General (Family Medicine) Erroll Luna, MD as Consulting Physician (General Surgery) Truitt Merle, MD as Consulting Physician (Hematology) Gery Pray, MD as Consulting Physician (Radiation Oncology)  Date of Service:  09/09/2021  CHIEF COMPLAINT: f/u of left breast cancer  CURRENT THERAPY:  Neoadjuvant Carbo/Taxol, 3 weeks on/1 week off, starting 09/09/21 -Keytruda q3weeks, starting 09/09/21  ASSESSMENT & PLAN:  Cheyenne [name] is a 41 y.o. female with   1. Malignant neoplasm of lower-inner quadrant of left breast, Stage IIIA, c(T2, N1), Functionally triple negative, Grade 3 -presented with palpable left breast mass. B/l MM and left [place] on 08/07/21 showed: 2.8 cm left breast mass at 7:30; at least 4 abnormal lymph nodes. Biopsy that day confirmed IDC in breast but node was negative. -breast MRI on 08/26/21  -We recommend breast MRI with and without contrast for further evaluation, especially her lymph nodes.  She will likely need a second biopsy of the lymph node -Her breast cancer is ER 2% positive, PR and HER2 negative, this is similar to triple negative breast cancer.  We discussed high risk of recurrence after surgery. -given the size of her tumor and number of suspicious lymph nodes, the recommendation is for neoadjuvant chemotherapy with carbo Taxol for 12 weeks, followed by dose dense Adriamycin and Cytoxan every 2 weeks for 4 cycles, along with immunotherapy Keytruda every 3 weeks for 1 year.  -port placed yesterday, 09/08/21 -she has recovered well from her enteritis. She is now ready to begin chemo today. -Labs reviewed, she developed a mild transaminitis, okay to proceed for dose chemo today, will monitor closely.  2. Enteritis -experienced severe abdominal pain (8/10) and diarrhea (7-10x a day) -PET scan on 08/31/21 showed suspected  acute inflammatory process involving central mesentery and pelvis. -she then went to the ED, treated with antibiotics. -stool sample 09/02/21 was negative for C.diff. -she reports her diarrhea finally resolved in the last few days.   3. Social Support -she has good support from her wife. -she has a history of anxiety and is on Xanax as needed. I advised her to f/u with her counselor. -she will consider FMLA. If she chooses to work, she would like to work from home. She will bring [place] any forms she needs filled out.    4.  Transaminitis -She was noticed to have AST 90 and ALT 174 on labs today, previous set of enzymes were normal.  The rest of LFTs were normal. -Unclear etiology of her transaminitis, not sure is related to her recent enteritis, will monitor closely  PLAN:  -proceed with first carbo/taxol and Keytruda today -lab, flush, and carbo/taxol weekly  -next Keytruda in 3 and 6 weeks -f/u in two weeks   No problem-specific Assessment & Plan notes found for this encounter.   SUMMARY OF ONCOLOGIC HISTORY: Oncology History Overview Note   Cancer Staging  Malignant neoplasm of lower-inner quadrant of left breast in female, estrogen receptor positive ([place]) Staging form: Breast, AJCC 8th Edition - Clinical stage from 08/07/2021: Stage IIIA (cT2, cN1, cM0, G3, ER+, PR-, HER2-) - Signed by Truitt Merle, MD on 08/18/2021    Malignant neoplasm of lower-inner quadrant of left breast in female, estrogen receptor positive ([place])  08/07/2021 Cancer Staging   Staging form: Breast, AJCC 8th Edition - Clinical stage from 08/07/2021: Stage IIB (cT2, cN0, cM0, G3, ER+, PR-,  HER2-) - Signed by Truitt Merle, MD on 08/19/2021 Stage prefix: Initial diagnosis Histologic grading system: 3 grade system    08/07/2021 Mammogram   CLINICAL DATA:  Palpable mass in the LEFT breast since August.   EXAM: DIGITAL DIAGNOSTIC BILATERAL MAMMOGRAM WITH TOMOSYNTHESIS AND CAD; ULTRASOUND LEFT BREAST  LIMITED  IMPRESSION: 1. Suspicious mass in the 7:30 o'clock location of the LEFT breast for which biopsy is recommended. 2. LEFT axillary adenopathy, with at least 4 abnormal lymph nodes.   08/07/2021 Initial Biopsy   Diagnosis 1. Breast, left, needle core biopsy, 7:30, ribbon clip - INVASIVE DUCTAL CARCINOMA - SEE COMMENT 2. Lymph node, needle/core biopsy, left axilla, coil clip - NO CARCINOMA IDENTIFIED Microscopic Comment 1. based on the biopsy, the carcinoma appears Nottingham grade 3 of 3 and measures 1.1 cm in greatest linear extent.  1. PROGNOSTIC INDICATORS Results: The tumor cells are EQUIVOCAL for Her2 (2+). Her2 by FISH will be performed and results reported separately. Estrogen Receptor: 2%, POSITIVE, WEAK STAINING INTENSITY Progesterone Receptor: <1%, NEGATIVE Proliferation Marker Ki67: 40%  1. FLUORESCENCE IN-SITU HYBRIDIZATION Results: GROUP 5: HER2 **NEGATIVE**   08/17/2021 Initial Diagnosis   Malignant neoplasm of lower-inner quadrant of left breast in female, estrogen receptor positive ([place])   08/19/2021 Genetic Testing   [facility] CancerNext-Expanded Panel is Negative. Report date is 09/02/2021.  The CancerNext-Expanded gene panel offered by [facility] and includes sequencing, rearrangement, and RNA analysis for the following 77 genes: AIP, ALK, APC, ATM, AXIN2, BAP1, BARD1, BLM, BMPR1A, BRCA1, BRCA2, BRIP1, CDC73, CDH1, CDK4, CDKN1B, CDKN2A, CHEK2, CTNNA1, DICER1, FANCC, FH, FLCN, GALNT12, KIF1B, LZTR1, MAX, MEN1, MET, MLH1, MSH2, MSH3, MSH6, MUTYH, NBN, NF1, NF2, NTHL1, PALB2, PHOX2B, PMS2, POT1, PRKAR1A, PTCH1, PTEN, RAD51C, RAD51D, RB1, RECQL, RET, SDHA, SDHAF2, SDHB, SDHC, SDHD, SMAD4, SMARCA4, SMARCB1, SMARCE1, STK11, SUFU, TMEM127, TP53, TSC1, TSC2, VHL and XRCC2 (sequencing and deletion/duplication); EGFR, EGLN1, HOXB13, KIT, MITF, PDGFRA, POLD1, and POLE (sequencing only); EPCAM and GREM1 (deletion/duplication only).    08/26/2021 Imaging   EXAM: BILATERAL  BREAST MRI WITH AND WITHOUT CONTRAST  IMPRESSION: 2.3 cm mass in the lower-inner quadrant of the left breast corresponding with the biopsy proven invasive ductal carcinoma.   08/31/2021 PET scan   IMPRESSION: 1. Known mass medially in the left breast is markedly hypermetabolic consistent with breast cancer. 2. No evidence of nodal metastases within the chest. No typical distant metastases. 3. Suspected acute inflammatory process involving central mesentery and pelvis with ill-defined focal hypermetabolic activity anterior to the sacrum. There are pelvic inflammatory changes and free pelvic fluid. Recent labs demonstrate mild leukocytosis. Differential considerations include acute appendicitis, enteritis and small perforated foreign body. No evidence of drainable fluid collection, pneumoperitoneum or bowel obstruction. 4. These results were called by telephone at the time of interpretation on 08/31/2021 at 5:12 pm to on-call oncology nurse, [name], who verbally acknowledged these results. Unless there is a good clinical explanation for these findings, evaluation in the emergency department is likely warranted, and patient may benefit from abdominopelvic CT with oral and intravenous contrast.   09/01/2021 Imaging   EXAM: CT ABDOMEN AND PELVIS WITH CONTRAST  IMPRESSION: Multiple thick-walled loops of distal small bowel, suggesting infectious/inflammatory enteritis. Associated small volume pelvic ascites. No pneumatosis or free air.   No evidence of bowel obstruction.  Normal appendix.   2.1 cm lesion in the medial left breast, likely corresponding to the patient's known primary breast neoplasm. No findings suspicious for metastatic disease.   09/09/2021 -  Chemotherapy   Patient is  on Treatment Plan : BREAST Pembrolizumab (200) D1 + Carboplatin (5) D1 + Paclitaxel (80) D1,8,15 q21d X 4 cycles / Pembrolizumab (200) D1 + AC D1 q21d x 4 cycles        INTERVAL HISTORY:  Cheyenne [name]  is here for a follow up of breast cancer. She was last seen by me on 09/02/21. She presents to the clinic accompanied by her wife.   All other systems were reviewed with the patient and are negative.  MEDICAL HISTORY:  Past Medical History:  Diagnosis Date   Anal fissure    Anxiety    Breast cancer ([place]) 08/10/21   Date diagnosis was given   DVT (deep venous thrombosis) ([place])    Incomplete right bundle branch block (RBBB) determined by electrocardiography 09/01/2021    SURGICAL HISTORY: Past Surgical History:  Procedure Laterality Date   NO PAST SURGERIES     PORTACATH PLACEMENT N/A 09/08/2021   Procedure: INSERTION PORT-A-CATH;  Surgeon: Erroll Luna, MD;  Location: [facility];  Service: General;  Laterality: N/A;    I have reviewed the social history and family history with the patient and they are unchanged from previous note.  ALLERGIES:  has No Known Allergies.  MEDICATIONS:  Current Outpatient Medications  Medication Sig Dispense Refill   acetaminophen (TYLENOL) 500 MG tablet Take 500-1,000 mg by mouth every 6 (six) hours as needed (pain.).     ALAYCEN 1/35 tablet Take 1 tablet by mouth in the morning.  4   albuterol (VENTOLIN HFA) 108 (90 Base) MCG/ACT inhaler Inhale 2 puffs into the lungs every 4 (four) hours as needed for wheezing. 1 each 5   ALPRAZolam (XANAX) 0.5 MG tablet Take one tablet twice daily as needed for anxiety. 45 tablet 2   amoxicillin-clavulanate (AUGMENTIN) 875-125 MG tablet Take 1 tablet by mouth every 12 (twelve) hours. 14 tablet 0   cyclobenzaprine (FLEXERIL) 10 MG tablet Take 10 mg by mouth daily as needed for muscle spasms.     desonide (DESOWEN) 0.05 % cream Apply topically 2 (two) times daily. (Patient taking differently: Apply 1 application topically 2 (two) times daily as needed (skin irritation.).) 30 g 1   desonide (DESOWEN) 0.05 % lotion Apply 1 application topically daily.     hydroquinone 4 % cream APPLY TO DARK SPOTS AT BEDTIME (Patient taking  differently: Apply 1 application topically daily as needed (dark spots). APPLY TO DARK SPOTS AT BEDTIME) 28.35 g 0   ibuprofen (ADVIL) 800 MG tablet Take 800 mg by mouth every 8 (eight) hours as needed for moderate pain.     ibuprofen (ADVIL) 800 MG tablet Take 1 tablet (800 mg total) by mouth every 8 (eight) hours as needed. 30 tablet 0   lidocaine-prilocaine (EMLA) cream Apply to affected area once 30 g 3   loratadine (CLARITIN) 10 MG tablet Take 10 mg by mouth daily.     Multiple Vitamins-Minerals (ONE A DAY IMMUNITY DEFENSE) CHEW Chew 1 tablet by mouth daily.     ondansetron (ZOFRAN) 8 MG tablet Take 1 tablet (8 mg total) by mouth 2 (two) times daily as needed. Start on the third day after carboplatin and AC chemotherapy. 30 tablet 1   oxyCODONE (OXY IR/ROXICODONE) 5 MG immediate release tablet Take 1 tablet (5 mg total) by mouth every 6 (six) hours as needed for severe pain. 15 tablet 0   prochlorperazine (COMPAZINE) 10 MG tablet Take 1 tablet (10 mg total) by mouth every 6 (six) hours as needed (Nausea or  vomiting). 30 tablet 1   traMADol (ULTRAM) 50 MG tablet Take 1 tablet (50 mg total) by mouth every 6 (six) hours as needed. 5 tablet 0   No current facility-administered medications for this visit.    PHYSICAL EXAMINATION: ECOG PERFORMANCE STATUS: 1 - Symptomatic but completely ambulatory  Vitals:   09/09/21 1232  BP: 125/74  Pulse: (!) 102  Resp: 17  Temp: 98.7 F (37.1 C)  SpO2: 100%   Wt Readings from Last 3 Encounters:  09/09/21 206 lb 3.2 oz (93.5 kg)  09/04/21 199 lb (90.3 kg)  09/02/21 203 lb 6.4 oz (92.3 kg)     GENERAL:alert, no distress and comfortable SKIN: skin color, texture, turgor are normal, no rashes or significant lesions EYES: normal, Conjunctiva are pink and non-injected, sclera clear NEURO: alert & oriented x 3 with fluent speech, no focal motor/sensory deficits BREAST: (+) 3 x 2 cm left breast mass.  LABORATORY DATA:  I have reviewed the data as  listed CBC Latest Ref Rng & Units 09/09/2021 08/31/2021 08/24/2021  WBC 4.0 - 10.5 K/uL 12.7(H) 12.2(H) 12.3(H)  Hemoglobin 12.0 - 15.0 g/dL 11.5(L) 13.9 13.1  Hematocrit 36.0 - 46.0 % 34.3(L) 40.8 38.9  Platelets 150 - 400 K/uL 635(H) 469(H) 440(H)     CMP Latest Ref Rng & Units 09/09/2021 08/31/2021 08/24/2021  Glucose 70 - 99 mg/dL 149(H) 156(H) 106(H)  BUN 6 - 20 mg/dL _0 Creatinine 0.44 - 1.00 mg/dL 0.92 0.88 0.91  Sodium 135 - 145 mmol/L 138 132(L) 139  Potassium 3.5 - 5.1 mmol/L 3.6 3.1(L) 3.6  Chloride 98 - 111 mmol/L 104 101 104  CO2 22 - 32 mmol/L 27 18(L) 24  Calcium 8.9 - 10.3 mg/dL 8.6(L) 8.8(L) 8.8(L)  Total Protein 6.5 - 8.1 g/dL 7.0 7.7 6.7  Total Bilirubin 0.3 - 1.2 mg/dL 0.5 0.5 0.5  Alkaline Phos 38 - 126 U/L 123 83 50  AST 15 - 41 U/L 90(H) 25 16  ALT 0 - 44 U/L 174(H) 39 13      RADIOGRAPHIC STUDIES: I have personally reviewed the radiological images as listed and agreed with the findings in the report. DG Chest Port 1 View  Result Date: 09/08/2021 CLINICAL DATA:  Placement of right chest port EXAM: PORTABLE CHEST 1 VIEW COMPARISON:  04/10/2013 FINDINGS: Tip of right IJ Port-A-Cath is seen in the superior vena cava. There is no pneumothorax. Cardiac size is within normal limits. Small linear densities in the right lower lung fields suggest subsegmental atelectasis. There is no pleural effusion or pneumothorax. IMPRESSION: Tip of central venous catheter is seen in the superior vena cava. There is no pneumothorax. Small linear densities in the right lower lung fields suggest subsegmental atelectasis. Electronically Signed   By: Elmer Picker M.D.   On: 09/08/2021 12:00   DG C-Arm 1-60 Min-No Report  Result Date: 09/08/2021 Fluoroscopy was utilized by the requesting physician.  No radiographic interpretation.      No orders of the defined types were placed in this encounter.  All questions were answered. The patient knows to call the clinic with any  problems, questions or concerns. No barriers to learning was detected. The total time spent in the appointment was 30 minutes.     Truitt Merle, MD 09/09/2021   I, Wilburn Mylar, am acting as scribe for Truitt Merle, MD.   I have reviewed the above documentation for accuracy and completeness, and I agree with the above.

## 2021-09-10 ENCOUNTER — Telehealth: Payer: Self-pay

## 2021-09-10 ENCOUNTER — Encounter: Payer: Self-pay | Admitting: Nurse Practitioner

## 2021-09-10 ENCOUNTER — Telehealth: Payer: Self-pay | Admitting: Hematology

## 2021-09-10 ENCOUNTER — Encounter: Payer: Self-pay | Admitting: *Deleted

## 2021-09-10 LAB — T4: T4, Total: 16 ug/dL — ABNORMAL HIGH (ref 4.5–12.0)

## 2021-09-10 NOTE — Telephone Encounter (Signed)
Cheyenne [name] is eating, drinking, and urinating well. No N/V or muscle aches. She began with loose stools just prior to the start of her treatment yesterday. She had 3 loose stools since returning home from her treatment. No abdominal pain noted. She was told that she could use Imodium.  She has not taken any. She was waiting to see if it resolved on its own. Instructed her to take 2 imodium now. Increase her fluid intake to 8-10 oz of caffeine free fluid every 1 1/2 hrs. Gatorade would be good to drink as well.  She can eat an egg sandwich about 11:30 to give the medication a chance to work.  Instructed her to call the office tomorrow morning if the diarrhea has not decreased or is worse as she was treated recently for enteritis.  Pt verbalized understanding.

## 2021-09-10 NOTE — Telephone Encounter (Signed)
-----   Message from Tildon Husky, RN sent at 09/09/2021  6:01 PM EST ----- Regarding: first time treatment follow up call - Cheyenne [name] Patient received chemo for the first time today. She is followed by Dr. Burr Medico. She received keytruda, carboplatin and taxol. No issues. Patient tolerated treatment well .

## 2021-09-10 NOTE — Telephone Encounter (Signed)
Scheduled follow-up appointments per 2/8 los. Patient is aware. 

## 2021-09-14 ENCOUNTER — Ambulatory Visit: Payer: Self-pay | Admitting: Genetic Counselor

## 2021-09-14 ENCOUNTER — Telehealth: Payer: Self-pay

## 2021-09-14 DIAGNOSIS — Z1379 Encounter for other screening for genetic and chromosomal anomalies: Secondary | ICD-10-CM

## 2021-09-14 NOTE — [place] (Signed)
HPI:   Cheyenne [name] was previously seen in the [place] clinic due to a personal and family history of cancer and concerns regarding a hereditary predisposition to cancer. Please refer to our prior cancer genetics clinic note for more information regarding our discussion, assessment and recommendations, at the time. Cheyenne [name] recent genetic test results were disclosed to her, as were recommendations warranted by these results. These results and recommendations are discussed in more detail below.  CANCER HISTORY:  Oncology History Overview Note   Cancer Staging  Malignant neoplasm of lower-inner quadrant of left breast in female, estrogen receptor positive ([place]) Staging form: Breast, AJCC 8th Edition - Clinical stage from 08/07/2021: Stage IIIA (cT2, cN1, cM0, G3, ER+, PR-, HER2-) - Signed by Truitt Merle, MD on 08/18/2021    Malignant neoplasm of lower-inner quadrant of left breast in female, estrogen receptor positive ([place])  08/07/2021 Cancer Staging   Staging form: Breast, AJCC 8th Edition - Clinical stage from 08/07/2021: Stage IIB (cT2, cN0, cM0, G3, ER+, PR-, HER2-) - Signed by Truitt Merle, MD on 08/19/2021 Stage prefix: Initial diagnosis Histologic grading system: 3 grade system    08/07/2021 Mammogram   CLINICAL DATA:  Palpable mass in the LEFT breast since August.   EXAM: DIGITAL DIAGNOSTIC BILATERAL MAMMOGRAM WITH TOMOSYNTHESIS AND CAD; ULTRASOUND LEFT BREAST LIMITED  IMPRESSION: 1. Suspicious mass in the 7:30 o'clock location of the LEFT breast for which biopsy is recommended. 2. LEFT axillary adenopathy, with at least 4 abnormal lymph nodes.   08/07/2021 Initial Biopsy   Diagnosis 1. Breast, left, needle core biopsy, 7:30, ribbon clip - INVASIVE DUCTAL CARCINOMA - SEE COMMENT 2. Lymph node, needle/core biopsy, left axilla, coil clip - NO CARCINOMA IDENTIFIED Microscopic Comment 1. based on the biopsy, the carcinoma appears Nottingham grade 3 of 3 and measures  1.1 cm in greatest linear extent.  1. PROGNOSTIC INDICATORS Results: The tumor cells are EQUIVOCAL for Her2 (2+). Her2 by FISH will be performed and results reported separately. Estrogen Receptor: 2%, POSITIVE, WEAK STAINING INTENSITY Progesterone Receptor: <1%, NEGATIVE Proliferation Marker Ki67: 40%  1. FLUORESCENCE IN-SITU HYBRIDIZATION Results: GROUP 5: HER2 **NEGATIVE**   08/17/2021 Initial Diagnosis   Malignant neoplasm of lower-inner quadrant of left breast in female, estrogen receptor positive ([place])   08/19/2021 Genetic Testing   [facility] CancerNext-Expanded Panel is Negative. Report date is 09/02/2021.  The CancerNext-Expanded gene panel offered by [facility] and includes sequencing, rearrangement, and RNA analysis for the following 77 genes: AIP, ALK, APC, ATM, AXIN2, BAP1, BARD1, BLM, BMPR1A, BRCA1, BRCA2, BRIP1, CDC73, CDH1, CDK4, CDKN1B, CDKN2A, CHEK2, CTNNA1, DICER1, FANCC, FH, FLCN, GALNT12, KIF1B, LZTR1, MAX, MEN1, MET, MLH1, MSH2, MSH3, MSH6, MUTYH, NBN, NF1, NF2, NTHL1, PALB2, PHOX2B, PMS2, POT1, PRKAR1A, PTCH1, PTEN, RAD51C, RAD51D, RB1, RECQL, RET, SDHA, SDHAF2, SDHB, SDHC, SDHD, SMAD4, SMARCA4, SMARCB1, SMARCE1, STK11, SUFU, TMEM127, TP53, TSC1, TSC2, VHL and XRCC2 (sequencing and deletion/duplication); EGFR, EGLN1, HOXB13, KIT, MITF, PDGFRA, POLD1, and POLE (sequencing only); EPCAM and GREM1 (deletion/duplication only).    08/26/2021 Imaging   EXAM: BILATERAL BREAST MRI WITH AND WITHOUT CONTRAST  IMPRESSION: 2.3 cm mass in the lower-inner quadrant of the left breast corresponding with the biopsy proven invasive ductal carcinoma.   08/31/2021 PET scan   IMPRESSION: 1. Known mass medially in the left breast is markedly hypermetabolic consistent with breast cancer. 2. No evidence of nodal metastases within the chest. No typical distant metastases. 3. Suspected acute inflammatory process involving central mesentery and pelvis with ill-defined focal hypermetabolic  activity anterior to the sacrum. There are pelvic inflammatory changes and free pelvic fluid. Recent labs demonstrate mild leukocytosis. Differential considerations include acute appendicitis, enteritis and small perforated foreign body. No evidence of drainable fluid collection, pneumoperitoneum or bowel obstruction. 4. These results were called by telephone at the time of interpretation on 08/31/2021 at 5:12 pm to on-call oncology nurse, [name], who verbally acknowledged these results. Unless there is a good clinical explanation for these findings, evaluation in the emergency department is likely warranted, and patient may benefit from abdominopelvic CT with oral and intravenous contrast.   09/01/2021 Imaging   EXAM: CT ABDOMEN AND PELVIS WITH CONTRAST  IMPRESSION: Multiple thick-walled loops of distal small bowel, suggesting infectious/inflammatory enteritis. Associated small volume pelvic ascites. No pneumatosis or free air.   No evidence of bowel obstruction.  Normal appendix.   2.1 cm lesion in the medial left breast, likely corresponding to the patient's known primary breast neoplasm. No findings suspicious for metastatic disease.   09/09/2021 -  Chemotherapy   Patient is on Treatment Plan : BREAST Pembrolizumab (200) D1 + Carboplatin (5) D1 + Paclitaxel (80) D1,8,15 q21d X 4 cycles / Pembrolizumab (200) D1 + AC D1 q21d x 4 cycles       FAMILY HISTORY:  We obtained a detailed, 4-generation family history.  Significant diagnoses are listed below:      Family History  Problem Relation Age of Onset   Hypertension Mother     Edema Mother     Diabetes Mother     Obesity Mother     Diabetes Sister     Obesity Sister     Breast cancer Other 81   Breast cancer Other 25        dx. with breast cancer again at age 62 (unsure if recurrence or second primary)   Colon cancer Neg Hx     Esophageal cancer Neg Hx     Rectal cancer Neg Hx          Cheyenne [name] has a paternal first  cousin once removed diagnosed with breast cancer at age 60. She has a another paternal first cousin once removed who was diagnosed with breast cancer at 17 and again at age 9 (unsure if it was a recurrence or second primary). Cheyenne [name] is unaware of previous family history of genetic testing for hereditary cancer risks. There is no reported Ashkenazi Jewish ancestry.  GENETIC TEST RESULTS:  The [facility] CancerNext-Expanded Panel found no pathogenic mutations.  The CancerNext-Expanded gene panel offered by [facility] and includes sequencing, rearrangement, and RNA analysis for the following 77 genes: AIP, ALK, APC, ATM, AXIN2, BAP1, BARD1, BLM, BMPR1A, BRCA1, BRCA2, BRIP1, CDC73, CDH1, CDK4, CDKN1B, CDKN2A, CHEK2, CTNNA1, DICER1, FANCC, FH, FLCN, GALNT12, KIF1B, LZTR1, MAX, MEN1, MET, MLH1, MSH2, MSH3, MSH6, MUTYH, NBN, NF1, NF2, NTHL1, PALB2, PHOX2B, PMS2, POT1, PRKAR1A, PTCH1, PTEN, RAD51C, RAD51D, RB1, RECQL, RET, SDHA, SDHAF2, SDHB, SDHC, SDHD, SMAD4, SMARCA4, SMARCB1, SMARCE1, STK11, SUFU, TMEM127, TP53, TSC1, TSC2, VHL and XRCC2 (sequencing and deletion/duplication); EGFR, EGLN1, HOXB13, KIT, MITF, PDGFRA, POLD1, and POLE (sequencing only); EPCAM and GREM1 (deletion/duplication only).   The test report has been scanned into EPIC and is located under the Molecular Pathology section of the Results Review tab.  A portion of the result report is included below for reference. Genetic testing reported out on 09/02/2021.        Even though a pathogenic variant was not identified, possible explanations for her personal history of cancer may include:  There may be no hereditary risk for cancer in the family. The cancers in Cheyenne [name] and/or her family may be due to other genetic or environmental factors. There may be a gene mutation in one of these genes that current testing methods cannot detect, but that chance is small. There could be another gene that has not yet been discovered, or that we have  not yet tested, that is responsible for the cancer diagnoses in the family.   Therefore, it is important to remain in touch with cancer genetics in the future so that we can continue to offer Cheyenne [name] the most up to date genetic testing.   ADDITIONAL GENETIC TESTING:  We discussed with Cheyenne [name] that her genetic testing was fairly extensive.  If there are genes identified to increase cancer risk that can be analyzed in the future, we would be happy to discuss and coordinate this testing at that time.    CANCER SCREENING RECOMMENDATIONS:  Cheyenne [name] test result is considered negative (normal).  This means that we have not identified a hereditary cause for her personal and family history of cancer at this time. Most cancers happen by chance and this negative test suggests that her cancer may fall into this category.    An individual's cancer risk and medical management are not determined by genetic test results alone. Overall cancer risk assessment incorporates additional factors, including personal medical history, family history, and any available genetic information that may result in a personalized plan for cancer prevention and surveillance. Therefore, it is recommended she continue to follow the cancer management and screening guidelines provided by her oncology and primary healthcare provider.  RECOMMENDATIONS FOR FAMILY MEMBERS:   Individuals in this family might be at some increased risk of developing cancer, over the general population risk, due to the family history of cancer. We recommend women in this family have a yearly mammogram beginning at age 39 (77 years before Cheyenne [name] age at diagnosis), an annual clinical breast exam, and perform monthly breast self-exams.  FOLLOW-UP:  Cancer genetics is a rapidly advancing field and it is possible that new genetic tests will be appropriate for her and/or her family members in the future. We encouraged her to remain in contact  with cancer genetics on an annual basis so we can update her personal and family histories and let her know of advances in cancer genetics that may benefit this family.   Our contact number was provided. Cheyenne [name] questions were answered to her satisfaction, and she knows she is welcome to call us at anytime with additional questions or concerns.   Lucille Passy, MS, [facility] Genetic Counselor [email or web address] (P) [phone]

## 2021-09-14 NOTE — Telephone Encounter (Signed)
Pt called stating she is having pain on her right side under her breast.  Pt said when she lays on her left side the pain is worst on her right side.  Pt also stated that if she lays on her right side where the pain is located, it does not hurt.  Pt is concerned because its very unusual.  Pt also stated she's had some dizziness when sitting and standing.  Pt would like to speak with Dr. Burr Medico regarding her symptoms.  Informed pt that I would make Dr. Burr Medico aware of her complaint.

## 2021-09-15 ENCOUNTER — Encounter: Payer: Self-pay | Admitting: Nurse Practitioner

## 2021-09-15 NOTE — [place] Notes (Signed)
Received approval letter for copay from [employer] for [place].  Patient approved for $25,000 1/1/131 -  12/31/20203. After insurance pays their portion for the first one, her copay will be $25.  A copy of the approval letter will be mailed to the patient for her records only.   A copy given to [facility] for billing/claim submissions. Added in [facility] [employer] as well.  She has my card for any additional financial questions or concerns.

## 2021-09-16 ENCOUNTER — Inpatient Hospital Stay: Payer: Federal, State, Local not specified - PPO

## 2021-09-16 ENCOUNTER — Inpatient Hospital Stay: Payer: Federal, State, Local not specified - PPO | Admitting: Hematology

## 2021-09-16 ENCOUNTER — Other Ambulatory Visit: Payer: Self-pay

## 2021-09-16 VITALS — BP 133/80 | HR 86 | Temp 98.1°F | Resp 16 | Wt 193.8 lb

## 2021-09-16 DIAGNOSIS — Z79899 Other long term (current) drug therapy: Secondary | ICD-10-CM | POA: Diagnosis not present

## 2021-09-16 DIAGNOSIS — C50312 Malignant neoplasm of lower-inner quadrant of left female breast: Secondary | ICD-10-CM

## 2021-09-16 DIAGNOSIS — Z5189 Encounter for other specified aftercare: Secondary | ICD-10-CM | POA: Diagnosis not present

## 2021-09-16 DIAGNOSIS — Z5112 Encounter for antineoplastic immunotherapy: Secondary | ICD-10-CM | POA: Diagnosis not present

## 2021-09-16 DIAGNOSIS — K529 Noninfective gastroenteritis and colitis, unspecified: Secondary | ICD-10-CM | POA: Diagnosis not present

## 2021-09-16 DIAGNOSIS — Z170 Estrogen receptor positive status [ER+]: Secondary | ICD-10-CM | POA: Diagnosis not present

## 2021-09-16 DIAGNOSIS — Z86718 Personal history of other venous thrombosis and embolism: Secondary | ICD-10-CM | POA: Diagnosis not present

## 2021-09-16 DIAGNOSIS — Z95828 Presence of other vascular implants and grafts: Secondary | ICD-10-CM | POA: Insufficient documentation

## 2021-09-16 DIAGNOSIS — Z5111 Encounter for antineoplastic chemotherapy: Secondary | ICD-10-CM | POA: Diagnosis not present

## 2021-09-16 LAB — CBC WITH DIFFERENTIAL (CANCER CENTER ONLY)
Abs Immature Granulocytes: 0.02 10*3/uL (ref 0.00–0.07)
Basophils Absolute: 0.1 10*3/uL (ref 0.0–0.1)
Basophils Relative: 2 %
Eosinophils Absolute: 0.2 10*3/uL (ref 0.0–0.5)
Eosinophils Relative: 3 %
HCT: 36.2 % (ref 36.0–46.0)
Hemoglobin: 12.2 g/dL (ref 12.0–15.0)
Immature Granulocytes: 0 %
Lymphocytes Relative: 34 %
Lymphs Abs: 2 10*3/uL (ref 0.7–4.0)
MCH: 28.7 pg (ref 26.0–34.0)
MCHC: 33.7 g/dL (ref 30.0–36.0)
MCV: 85.2 fL (ref 80.0–100.0)
Monocytes Absolute: 0.2 10*3/uL (ref 0.1–1.0)
Monocytes Relative: 3 %
Neutro Abs: 3.5 10*3/uL (ref 1.7–7.7)
Neutrophils Relative %: 58 %
Platelet Count: 516 10*3/uL — ABNORMAL HIGH (ref 150–400)
RBC: 4.25 MIL/uL (ref 3.87–5.11)
RDW: 13 % (ref 11.5–15.5)
WBC Count: 6 10*3/uL (ref 4.0–10.5)
nRBC: 0 % (ref 0.0–0.2)

## 2021-09-16 LAB — CMP (CANCER CENTER ONLY)
ALT: 114 U/L — ABNORMAL HIGH (ref 0–44)
AST: 36 U/L (ref 15–41)
Albumin: 4 g/dL (ref 3.5–5.0)
Alkaline Phosphatase: 98 U/L (ref 38–126)
Anion gap: 9 (ref 5–15)
BUN: 13 mg/dL (ref 6–20)
CO2: 27 mmol/L (ref 22–32)
Calcium: 9.7 mg/dL (ref 8.9–10.3)
Chloride: 101 mmol/L (ref 98–111)
Creatinine: 0.84 mg/dL (ref 0.44–1.00)
GFR, Estimated: >60 mL/min (ref >60)
Glucose, Bld: 116 mg/dL — ABNORMAL HIGH (ref 70–99)
Potassium: 3.6 mmol/L (ref 3.5–5.1)
Sodium: 137 mmol/L (ref 135–145)
Total Bilirubin: 0.4 mg/dL (ref 0.3–1.2)
Total Protein: 8 g/dL (ref 6.5–8.1)

## 2021-09-16 LAB — TSH: TSH: 1.416 u[IU]/mL (ref 0.308–3.960)

## 2021-09-16 MED ORDER — DIPHENHYDRAMINE HCL 50 MG/ML IJ SOLN
25.0000 mg | Freq: Once | INTRAMUSCULAR | Status: AC
  Administered 2021-09-16: 25 mg via INTRAVENOUS
  Filled 2021-09-16: qty 1

## 2021-09-16 MED ORDER — HEPARIN SOD (PORK) LOCK FLUSH 100 UNIT/ML IV SOLN
500.0000 [IU] | Freq: Once | INTRAVENOUS | Status: AC | PRN
  Administered 2021-09-16: 500 [IU]

## 2021-09-16 MED ORDER — SODIUM CHLORIDE 0.9% FLUSH
10.0000 mL | Freq: Once | INTRAVENOUS | Status: AC
  Administered 2021-09-16: 10 mL

## 2021-09-16 MED ORDER — FAMOTIDINE IN NACL 20-0.9 MG/50ML-% IV SOLN
20.0000 mg | Freq: Once | INTRAVENOUS | Status: AC
  Administered 2021-09-16: 20 mg via INTRAVENOUS
  Filled 2021-09-16: qty 50

## 2021-09-16 MED ORDER — SODIUM CHLORIDE 0.9% FLUSH
10.0000 mL | INTRAVENOUS | Status: DC | PRN
  Administered 2021-09-16: 10 mL

## 2021-09-16 MED ORDER — SODIUM CHLORIDE 0.9 % IV SOLN: Freq: Once | INTRAVENOUS | Status: AC

## 2021-09-16 MED ORDER — SODIUM CHLORIDE 0.9 % IV SOLN
80.0000 mg/m2 | Freq: Once | INTRAVENOUS | Status: AC
  Administered 2021-09-16: 162 mg via INTRAVENOUS
  Filled 2021-09-16: qty 27

## 2021-09-16 NOTE — [place] Notes (Signed)
Per Dr. Burr Medico OK to trt w/ elevated ALT 114 today

## 2021-09-16 NOTE — [place] Notes (Signed)
[place]   [phone] [phone]   Clinic Follow up Note   Patient Care Team: Billie Ruddy, MD as PCP - General (Family Medicine) Erroll Luna, MD as Consulting Physician (General Surgery) Truitt Merle, MD as Consulting Physician (Hematology) Gery Pray, MD as Consulting Physician (Radiation Oncology)  Date of Service:  09/16/2021  CHIEF COMPLAINT: f/u of left breast cancer  CURRENT THERAPY:  Neoadjuvant Carbo/Taxol, 3 weeks on/1 week off, starting 09/09/21 -Keytruda q3weeks, starting 09/09/21  ASSESSMENT & PLAN:  Cheyenne [name] is a 41 y.o. female with   1. Malignant neoplasm of lower-inner quadrant of left breast, Stage IIIA, c(T2, N1), Functionally triple negative, Grade 3 -presented with palpable left breast mass. B/l MM and left [place] on 08/07/21 showed: 2.8 cm left breast mass at 7:30; at least 4 abnormal lymph nodes. Biopsy that day confirmed IDC in breast but node was negative. -breast MRI on 08/26/21 and PET on 08/31/21 were negative aside from primary mass. -Her breast cancer is ER 2% positive, PR and HER2 negative, this is similar to triple negative breast cancer.   -port placed 09/08/21 -she began neoadjuvant carbo/taxol and keytruda on 09/09/20. This will be followed by dose dense Adriamycin and Cytoxan every 2 weeks for 4 cycles, along with immunotherapy Keytruda every 3 weeks for 1 year.  -she tolerated cycle one well overall except mild fatigue and some nonspecific bilateral rib cage pain which could be related to Taxol. Labs reviewed, overall WNL. We will proceed with cycle 2.   2. Social Support -she has good support from her wife. -she has a history of anxiety and is on Xanax as needed. I advised her to f/u with her counselor. -she is requiring paperwork for her job to allow her to work from home. We are trying to get this written appropriately for her.   3. Transaminitis -She was noticed to have AST 90 and ALT 174 on labs  today, previous set of enzymes were normal.  The rest of LFTs were normal. -improved, ALT remains elevated at 114 today (09/16/21).    PLAN:  -proceed with second week taxol today -lab, flush, and taxol weekly, carbo in 2 and 5 weeks              -next Keytruda in 2 and 5 weeks -f/u with NP Lacie in 3 and 5 weeks as scheduled -I wrote a letter for her work accomodation   No problem-specific [place] notes found for this encounter.   SUMMARY OF ONCOLOGIC HISTORY: Oncology History Overview Note   Cancer Staging  Malignant neoplasm of lower-inner quadrant of left breast in female, estrogen receptor positive ([place]) Staging form: Breast, AJCC 8th Edition - Clinical stage from 08/07/2021: Stage IIIA (cT2, cN1, cM0, G3, ER+, PR-, HER2-) - Signed by Truitt Merle, MD on 08/18/2021    Malignant neoplasm of lower-inner quadrant of left breast in female, estrogen receptor positive ([place])  08/07/2021 Cancer Staging   Staging form: Breast, AJCC 8th Edition - Clinical stage from 08/07/2021: Stage IIB (cT2, cN0, cM0, G3, ER+, PR-, HER2-) - Signed by Truitt Merle, MD on 08/19/2021 Stage prefix: Initial diagnosis Histologic grading system: 3 grade system    08/07/2021 Mammogram   CLINICAL DATA:  Palpable mass in the LEFT breast since August.   EXAM: DIGITAL DIAGNOSTIC BILATERAL MAMMOGRAM WITH TOMOSYNTHESIS AND CAD; ULTRASOUND LEFT BREAST LIMITED  IMPRESSION: 1. Suspicious mass in the 7:30 o'clock location of the LEFT breast for which biopsy is recommended.  2. LEFT axillary adenopathy, with at least 4 abnormal lymph nodes.   08/07/2021 Initial Biopsy   Diagnosis 1. Breast, left, needle core biopsy, 7:30, ribbon clip - INVASIVE DUCTAL CARCINOMA - SEE COMMENT 2. Lymph node, needle/core biopsy, left axilla, coil clip - NO CARCINOMA IDENTIFIED Microscopic Comment 1. based on the biopsy, the carcinoma appears Nottingham grade 3 of 3 and measures 1.1 cm in greatest linear extent.  1. PROGNOSTIC  INDICATORS Results: The tumor cells are EQUIVOCAL for Her2 (2+). Her2 by FISH will be performed and results reported separately. Estrogen Receptor: 2%, POSITIVE, WEAK STAINING INTENSITY Progesterone Receptor: <1%, NEGATIVE Proliferation Marker Ki67: 40%  1. FLUORESCENCE IN-SITU HYBRIDIZATION Results: GROUP 5: HER2 **NEGATIVE**   08/17/2021 Initial Diagnosis   Malignant neoplasm of lower-inner quadrant of left breast in female, estrogen receptor positive ([place])   08/19/2021 Genetic Testing   [facility] CancerNext-Expanded Panel is Negative. Report date is 09/02/2021.  The CancerNext-Expanded gene panel offered by [facility] and includes sequencing, rearrangement, and RNA analysis for the following 77 genes: AIP, ALK, APC, ATM, AXIN2, BAP1, BARD1, BLM, BMPR1A, BRCA1, BRCA2, BRIP1, CDC73, CDH1, CDK4, CDKN1B, CDKN2A, CHEK2, CTNNA1, DICER1, FANCC, FH, FLCN, GALNT12, KIF1B, LZTR1, MAX, MEN1, MET, MLH1, MSH2, MSH3, MSH6, MUTYH, NBN, NF1, NF2, NTHL1, PALB2, PHOX2B, PMS2, POT1, PRKAR1A, PTCH1, PTEN, RAD51C, RAD51D, RB1, RECQL, RET, SDHA, SDHAF2, SDHB, SDHC, SDHD, SMAD4, SMARCA4, SMARCB1, SMARCE1, STK11, SUFU, TMEM127, TP53, TSC1, TSC2, VHL and XRCC2 (sequencing and deletion/duplication); EGFR, EGLN1, HOXB13, KIT, MITF, PDGFRA, POLD1, and POLE (sequencing only); EPCAM and GREM1 (deletion/duplication only).    08/26/2021 Imaging   EXAM: BILATERAL BREAST MRI WITH AND WITHOUT CONTRAST  IMPRESSION: 2.3 cm mass in the lower-inner quadrant of the left breast corresponding with the biopsy proven invasive ductal carcinoma.   08/31/2021 PET scan   IMPRESSION: 1. Known mass medially in the left breast is markedly hypermetabolic consistent with breast cancer. 2. No evidence of nodal metastases within the chest. No typical distant metastases. 3. Suspected acute inflammatory process involving central mesentery and pelvis with ill-defined focal hypermetabolic activity anterior to the sacrum. There are pelvic  inflammatory changes and free pelvic fluid. Recent labs demonstrate mild leukocytosis. Differential considerations include acute appendicitis, enteritis and small perforated foreign body. No evidence of drainable fluid collection, pneumoperitoneum or bowel obstruction. 4. These results were called by telephone at the time of interpretation on 08/31/2021 at 5:12 pm to on-call oncology nurse, [name], who verbally acknowledged these results. Unless there is a good clinical explanation for these findings, evaluation in the emergency department is likely warranted, and patient may benefit from abdominopelvic CT with oral and intravenous contrast.   09/01/2021 Imaging   EXAM: CT ABDOMEN AND PELVIS WITH CONTRAST  IMPRESSION: Multiple thick-walled loops of distal small bowel, suggesting infectious/inflammatory enteritis. Associated small volume pelvic ascites. No pneumatosis or free air.   No evidence of bowel obstruction.  Normal appendix.   2.1 cm lesion in the medial left breast, likely corresponding to the patient's known primary breast neoplasm. No findings suspicious for metastatic disease.   09/09/2021 -  Chemotherapy   Patient is on Treatment Plan : BREAST Pembrolizumab (200) D1 + Carboplatin (5) D1 + Paclitaxel (80) D1,8,15 q21d X 4 cycles / Pembrolizumab (200) D1 + AC D1 q21d x 4 cycles        INTERVAL HISTORY:  Cheyenne [name] is here for a follow up of breast cancer. She was last seen by me on 09/09/21. She was seen in the infusion area. She reports pain  under her left breast in her rib cage area that is intermittent and sometimes tender.   All other systems were reviewed with the patient and are negative.  MEDICAL HISTORY:  Past Medical History:  Diagnosis Date   Anal fissure    Anxiety    Breast cancer ([place]) 08/10/21   Date diagnosis was given   DVT (deep venous thrombosis) ([place])    Incomplete right bundle branch block (RBBB) determined by electrocardiography 09/01/2021     SURGICAL HISTORY: Past Surgical History:  Procedure Laterality Date   NO PAST SURGERIES     PORTACATH PLACEMENT N/A 09/08/2021   Procedure: INSERTION PORT-A-CATH;  Surgeon: Erroll Luna, MD;  Location: [facility] ORS;  Service: General;  Laterality: N/A;    I have reviewed the social history and family history with the patient and they are unchanged from previous note.  ALLERGIES:  has No Known Allergies.  MEDICATIONS:  Current Outpatient Medications  Medication Sig Dispense Refill   acetaminophen (TYLENOL) 500 MG tablet Take 500-1,000 mg by mouth every 6 (six) hours as needed (pain.).     ALAYCEN 1/35 tablet Take 1 tablet by mouth in the morning.  4   albuterol (VENTOLIN HFA) 108 (90 Base) MCG/ACT inhaler Inhale 2 puffs into the lungs every 4 (four) hours as needed for wheezing. 1 each 5   ALPRAZolam (XANAX) 0.5 MG tablet Take one tablet twice daily as needed for anxiety. 45 tablet 2   amoxicillin-clavulanate (AUGMENTIN) 875-125 MG tablet Take 1 tablet by mouth every 12 (twelve) hours. 14 tablet 0   cyclobenzaprine (FLEXERIL) 10 MG tablet Take 10 mg by mouth daily as needed for muscle spasms.     desonide (DESOWEN) 0.05 % cream Apply topically 2 (two) times daily. (Patient taking differently: Apply 1 application topically 2 (two) times daily as needed (skin irritation.).) 30 g 1   desonide (DESOWEN) 0.05 % lotion Apply 1 application topically daily.     hydroquinone 4 % cream APPLY TO DARK SPOTS AT BEDTIME (Patient taking differently: Apply 1 application topically daily as needed (dark spots). APPLY TO DARK SPOTS AT BEDTIME) 28.35 g 0   ibuprofen (ADVIL) 800 MG tablet Take 800 mg by mouth every 8 (eight) hours as needed for moderate pain.     ibuprofen (ADVIL) 800 MG tablet Take 1 tablet (800 mg total) by mouth every 8 (eight) hours as needed. 30 tablet 0   lidocaine-prilocaine (EMLA) cream Apply to affected area once 30 g 3   loratadine (CLARITIN) 10 MG tablet Take 10 mg by mouth daily.      Multiple Vitamins-Minerals (ONE A DAY IMMUNITY DEFENSE) CHEW Chew 1 tablet by mouth daily.     ondansetron (ZOFRAN) 8 MG tablet Take 1 tablet (8 mg total) by mouth 2 (two) times daily as needed. Start on the third day after carboplatin and AC chemotherapy. 30 tablet 1   oxyCODONE (OXY IR/ROXICODONE) 5 MG immediate release tablet Take 1 tablet (5 mg total) by mouth every 6 (six) hours as needed for severe pain. 15 tablet 0   prochlorperazine (COMPAZINE) 10 MG tablet Take 1 tablet (10 mg total) by mouth every 6 (six) hours as needed (Nausea or vomiting). 30 tablet 1   traMADol (ULTRAM) 50 MG tablet Take 1 tablet (50 mg total) by mouth every 6 (six) hours as needed. 5 tablet 0   No current facility-administered medications for this visit.    PHYSICAL EXAMINATION: ECOG PERFORMANCE STATUS: 1 - Symptomatic but completely ambulatory  There were no  vitals filed for this visit. Wt Readings from Last 3 Encounters:  09/16/21 193 lb 12 oz (87.9 kg)  09/09/21 206 lb 3.2 oz (93.5 kg)  09/04/21 199 lb (90.3 kg)     GENERAL:alert, no distress and comfortable SKIN: skin color normal, no rashes or significant lesions EYES: normal, Conjunctiva are pink and non-injected, sclera clear  NEURO: alert & oriented x 3 with fluent speech   LABORATORY DATA:  I have reviewed the data as listed CBC Latest Ref Rng & Units 09/16/2021 09/09/2021 08/31/2021  WBC 4.0 - 10.5 K/uL 6.0 12.7(H) 12.2(H)  Hemoglobin 12.0 - 15.0 g/dL 12.2 11.5(L) 13.9  Hematocrit 36.0 - 46.0 % 36.2 34.3(L) 40.8  Platelets 150 - 400 K/uL 516(H) 635(H) 469(H)     CMP Latest Ref Rng & Units 09/09/2021 08/31/2021 08/24/2021  Glucose 70 - 99 mg/dL 149(H) 156(H) 106(H)  BUN 6 - 20 mg/dL 6 14 10   Creatinine 0.44 - 1.00 mg/dL 0.92 0.88 0.91  Sodium 135 - 145 mmol/L 138 132(L) 139  Potassium 3.5 - 5.1 mmol/L 3.6 3.1(L) 3.6  Chloride 98 - 111 mmol/L 104 101 104  CO2 22 - 32 mmol/L 27 18(L) 24  Calcium 8.9 - 10.3 mg/dL 8.6(L) 8.8(L) 8.8(L)  Total  Protein 6.5 - 8.1 g/dL 7.0 7.7 6.7  Total Bilirubin 0.3 - 1.2 mg/dL 0.5 0.5 0.5  Alkaline Phos 38 - 126 U/L 123 83 50  AST 15 - 41 U/L 90(H) 25 16  ALT 0 - 44 U/L 174(H) 39 13      RADIOGRAPHIC STUDIES: I have personally reviewed the radiological images as listed and agreed with the findings in the report. No results found.    No orders of the defined types were placed in this encounter.  All questions were answered. The patient knows to call the clinic with any problems, questions or concerns. No barriers to learning was detected. The total time spent in the appointment was 30 minutes.     Truitt Merle, MD 09/16/2021   I, Wilburn Mylar, am acting as scribe for Truitt Merle, MD.   I have reviewed the above documentation for accuracy and completeness, and I agree with the above.

## 2021-09-16 NOTE — Patient Instructions (Signed)
[facility]  Discharge Instructions: Thank you for choosing [facility] to provide your oncology and hematology care.   If you have a lab appointment with the [facility], please go directly to the [facility] and check in at the registration area.   Wear comfortable clothing and clothing appropriate for easy access to any Portacath or PICC line.   We strive to give you quality time with your provider. You may need to reschedule your appointment if you arrive late (15 or more minutes).  Arriving late affects you and other patients whose appointments are after yours.  Also, if you miss three or more appointments without notifying the office, you may be dismissed from the clinic at the providers discretion.      For prescription refill requests, have your pharmacy contact our office and allow 72 hours for refills to be completed.    Today you received the following chemotherapy and/or immunotherapy agents: Taxol   To help prevent nausea and vomiting after your treatment, we encourage you to take your nausea medication as directed.  BELOW ARE SYMPTOMS THAT SHOULD BE REPORTED IMMEDIATELY: *FEVER GREATER THAN 100.4 F (38 C) OR HIGHER *CHILLS OR SWEATING *NAUSEA AND VOMITING THAT IS NOT CONTROLLED WITH YOUR NAUSEA MEDICATION *UNUSUAL SHORTNESS OF BREATH *UNUSUAL BRUISING OR BLEEDING *URINARY PROBLEMS (pain or burning when urinating, or frequent urination) *BOWEL PROBLEMS (unusual diarrhea, constipation, pain near the anus) TENDERNESS IN MOUTH AND THROAT WITH OR WITHOUT PRESENCE OF ULCERS (sore throat, sores in mouth, or a toothache) UNUSUAL RASH, SWELLING OR PAIN  UNUSUAL VAGINAL DISCHARGE OR ITCHING   Items with * indicate a potential emergency and should be followed up as soon as possible or go to the Emergency Department if any problems should occur.  Please show the CHEMOTHERAPY ALERT CARD or IMMUNOTHERAPY ALERT CARD at check-in to the  Emergency Department and triage nurse.  Should you have questions after your visit or need to cancel or reschedule your appointment, please contact [facility]  Dept: [phone]  and follow the prompts.  Office hours are 8:00 a.m. to 4:30 p.m. Monday - Friday. Please note that voicemails left after 4:00 p.m. may not be returned until the following business day.  We are closed weekends and major holidays. You have access to a nurse at all times for urgent questions. Please call the main number to the clinic Dept: [phone] and follow the prompts.   For any non-urgent questions, you may also contact your provider using MyChart. We now offer e-Visits for anyone 74 and older to request care online for non-urgent symptoms. For details visit [email or web address].   Also download the MyChart app! Go to the app store, search "MyChart", open the app, select [facility], and log in with your MyChart username and password.  Due to Covid, a mask is required upon entering the hospital/clinic. If you do not have a mask, one will be given to you upon arrival. For doctor visits, patients may have 1 support person aged 42 or older with them. For treatment visits, patients cannot have anyone with them due to current Covid guidelines and our immunocompromised population.

## 2021-09-17 ENCOUNTER — Inpatient Hospital Stay: Payer: Federal, State, Local not specified - PPO

## 2021-09-17 LAB — T4: T4, Total: 13.3 ug/dL — ABNORMAL HIGH (ref 4.5–12.0)

## 2021-09-18 ENCOUNTER — Ambulatory Visit: Payer: Federal, State, Local not specified - PPO

## 2021-09-18 ENCOUNTER — Encounter: Payer: Self-pay | Admitting: Hematology

## 2021-09-18 ENCOUNTER — Telehealth: Payer: Self-pay

## 2021-09-18 NOTE — Telephone Encounter (Signed)
Spoke w/pt via telephone regarding the MyChart message sent regarding pt's PortaCath.  Pt denied pain or swelling at the site.  Pt's port is fairly new and based off picture attached to message the redness is not outside of normal limits.  This is the 2nd to 3rd time pt's port has been accessed.  Educated pt on s/s of infection and when to contact Dr. Ernestina Penna office.  Pt verbalized understanding of teaching.  Instructed pt to contact Dr. Ernestina Penna office should she have additional questions or concerns.  Pt has no further questions or concerns at this time.

## 2021-09-19 ENCOUNTER — Ambulatory Visit: Payer: Federal, State, Local not specified - PPO

## 2021-09-19 ENCOUNTER — Encounter: Payer: Self-pay | Admitting: Hematology

## 2021-09-22 ENCOUNTER — Encounter: Payer: Self-pay | Admitting: Family Medicine

## 2021-09-22 ENCOUNTER — Other Ambulatory Visit: Payer: Self-pay | Admitting: *Deleted

## 2021-09-23 ENCOUNTER — Other Ambulatory Visit: Payer: Self-pay

## 2021-09-23 ENCOUNTER — Encounter: Payer: Self-pay | Admitting: *Deleted

## 2021-09-23 ENCOUNTER — Inpatient Hospital Stay: Payer: Federal, State, Local not specified - PPO

## 2021-09-23 VITALS — BP 137/80 | HR 88 | Temp 98.3°F | Resp 18 | Wt 195.5 lb

## 2021-09-23 DIAGNOSIS — Z170 Estrogen receptor positive status [ER+]: Secondary | ICD-10-CM

## 2021-09-23 DIAGNOSIS — Z5112 Encounter for antineoplastic immunotherapy: Secondary | ICD-10-CM | POA: Diagnosis not present

## 2021-09-23 DIAGNOSIS — Z5189 Encounter for other specified aftercare: Secondary | ICD-10-CM | POA: Diagnosis not present

## 2021-09-23 DIAGNOSIS — K529 Noninfective gastroenteritis and colitis, unspecified: Secondary | ICD-10-CM | POA: Diagnosis not present

## 2021-09-23 DIAGNOSIS — Z95828 Presence of other vascular implants and grafts: Secondary | ICD-10-CM

## 2021-09-23 DIAGNOSIS — Z86718 Personal history of other venous thrombosis and embolism: Secondary | ICD-10-CM | POA: Diagnosis not present

## 2021-09-23 DIAGNOSIS — C50312 Malignant neoplasm of lower-inner quadrant of left female breast: Secondary | ICD-10-CM | POA: Diagnosis not present

## 2021-09-23 DIAGNOSIS — Z79899 Other long term (current) drug therapy: Secondary | ICD-10-CM | POA: Diagnosis not present

## 2021-09-23 DIAGNOSIS — Z5111 Encounter for antineoplastic chemotherapy: Secondary | ICD-10-CM | POA: Diagnosis not present

## 2021-09-23 LAB — CBC WITH DIFFERENTIAL (CANCER CENTER ONLY)
Abs Immature Granulocytes: 0.02 10*3/uL (ref 0.00–0.07)
Basophils Absolute: 0.1 10*3/uL (ref 0.0–0.1)
Basophils Relative: 1 %
Eosinophils Absolute: 0.2 10*3/uL (ref 0.0–0.5)
Eosinophils Relative: 3 %
HCT: 34.6 % — ABNORMAL LOW (ref 36.0–46.0)
Hemoglobin: 11.7 g/dL — ABNORMAL LOW (ref 12.0–15.0)
Immature Granulocytes: 0 %
Lymphocytes Relative: 38 %
Lymphs Abs: 2.1 10*3/uL (ref 0.7–4.0)
MCH: 28.8 pg (ref 26.0–34.0)
MCHC: 33.8 g/dL (ref 30.0–36.0)
MCV: 85.2 fL (ref 80.0–100.0)
Monocytes Absolute: 0.4 10*3/uL (ref 0.1–1.0)
Monocytes Relative: 7 %
Neutro Abs: 2.9 10*3/uL (ref 1.7–7.7)
Neutrophils Relative %: 51 %
Platelet Count: 227 10*3/uL (ref 150–400)
RBC: 4.06 MIL/uL (ref 3.87–5.11)
RDW: 12.9 % (ref 11.5–15.5)
Smear Review: NORMAL
WBC Count: 5.6 10*3/uL (ref 4.0–10.5)
nRBC: 0 % (ref 0.0–0.2)

## 2021-09-23 LAB — CMP (CANCER CENTER ONLY)
ALT: 42 U/L (ref 0–44)
AST: 27 U/L (ref 15–41)
Albumin: 4 g/dL (ref 3.5–5.0)
Alkaline Phosphatase: 95 U/L (ref 38–126)
Anion gap: 9 (ref 5–15)
BUN: 14 mg/dL (ref 6–20)
CO2: 26 mmol/L (ref 22–32)
Calcium: 9.3 mg/dL (ref 8.9–10.3)
Chloride: 104 mmol/L (ref 98–111)
Creatinine: 0.83 mg/dL (ref 0.44–1.00)
GFR, Estimated: >60 mL/min (ref >60)
Glucose, Bld: 138 mg/dL — ABNORMAL HIGH (ref 70–99)
Potassium: 3.4 mmol/L — ABNORMAL LOW (ref 3.5–5.1)
Sodium: 139 mmol/L (ref 135–145)
Total Bilirubin: 0.5 mg/dL (ref 0.3–1.2)
Total Protein: 7.8 g/dL (ref 6.5–8.1)

## 2021-09-23 LAB — TSH: TSH: 1.99 u[IU]/mL (ref 0.308–3.960)

## 2021-09-23 LAB — T4, FREE: Free T4: 1 ng/dL (ref 0.61–1.12)

## 2021-09-23 MED ORDER — SODIUM CHLORIDE 0.9% FLUSH
10.0000 mL | Freq: Once | INTRAVENOUS | Status: AC
  Administered 2021-09-23: 10 mL

## 2021-09-23 MED ORDER — SODIUM CHLORIDE 0.9 % IV SOLN: Freq: Once | INTRAVENOUS | Status: AC

## 2021-09-23 MED ORDER — SODIUM CHLORIDE 0.9 % IV SOLN
80.0000 mg/m2 | Freq: Once | INTRAVENOUS | Status: AC
  Administered 2021-09-23: 162 mg via INTRAVENOUS
  Filled 2021-09-23: qty 27

## 2021-09-23 MED ORDER — SODIUM CHLORIDE 0.9% FLUSH
10.0000 mL | INTRAVENOUS | Status: DC | PRN
  Administered 2021-09-23: 10 mL

## 2021-09-23 MED ORDER — DIPHENHYDRAMINE HCL 50 MG/ML IJ SOLN
25.0000 mg | Freq: Once | INTRAMUSCULAR | Status: AC
  Administered 2021-09-23: 25 mg via INTRAVENOUS
  Filled 2021-09-23: qty 1

## 2021-09-23 MED ORDER — FAMOTIDINE IN NACL 20-0.9 MG/50ML-% IV SOLN
20.0000 mg | Freq: Once | INTRAVENOUS | Status: AC
  Administered 2021-09-23: 20 mg via INTRAVENOUS
  Filled 2021-09-23: qty 50

## 2021-09-23 MED ORDER — HEPARIN SOD (PORK) LOCK FLUSH 100 UNIT/ML IV SOLN
500.0000 [IU] | Freq: Once | INTRAVENOUS | Status: AC | PRN
  Administered 2021-09-23: 500 [IU]

## 2021-09-23 NOTE — Patient Instructions (Signed)
[facility]  Discharge Instructions: Thank you for choosing [facility] to provide your oncology and hematology care.   If you have a lab appointment with the [facility], please go directly to the [facility] and check in at the registration area.   Wear comfortable clothing and clothing appropriate for easy access to any Portacath or PICC line.   We strive to give you quality time with your provider. You may need to reschedule your appointment if you arrive late (15 or more minutes).  Arriving late affects you and other patients whose appointments are after yours.  Also, if you miss three or more appointments without notifying the office, you may be dismissed from the clinic at the providers discretion.      For prescription refill requests, have your pharmacy contact our office and allow 72 hours for refills to be completed.    Today you received the following chemotherapy and/or immunotherapy agents Taxol      To help prevent nausea and vomiting after your treatment, we encourage you to take your nausea medication as directed.  BELOW ARE SYMPTOMS THAT SHOULD BE REPORTED IMMEDIATELY: *FEVER GREATER THAN 100.4 F (38 C) OR HIGHER *CHILLS OR SWEATING *NAUSEA AND VOMITING THAT IS NOT CONTROLLED WITH YOUR NAUSEA MEDICATION *UNUSUAL SHORTNESS OF BREATH *UNUSUAL BRUISING OR BLEEDING *URINARY PROBLEMS (pain or burning when urinating, or frequent urination) *BOWEL PROBLEMS (unusual diarrhea, constipation, pain near the anus) TENDERNESS IN MOUTH AND THROAT WITH OR WITHOUT PRESENCE OF ULCERS (sore throat, sores in mouth, or a toothache) UNUSUAL RASH, SWELLING OR PAIN  UNUSUAL VAGINAL DISCHARGE OR ITCHING   Items with * indicate a potential emergency and should be followed up as soon as possible or go to the Emergency Department if any problems should occur.  Please show the CHEMOTHERAPY ALERT CARD or IMMUNOTHERAPY ALERT CARD at check-in to the  Emergency Department and triage nurse.  Should you have questions after your visit or need to cancel or reschedule your appointment, please contact [facility]  Dept: [phone]  and follow the prompts.  Office hours are 8:00 a.m. to 4:30 p.m. Monday - Friday. Please note that voicemails left after 4:00 p.m. may not be returned until the following business day.  We are closed weekends and major holidays. You have access to a nurse at all times for urgent questions. Please call the main number to the clinic Dept: [phone] and follow the prompts.   For any non-urgent questions, you may also contact your provider using MyChart. We now offer e-Visits for anyone 67 and older to request care online for non-urgent symptoms. For details visit [email or web address].   Also download the MyChart app! Go to the app store, search "MyChart", open the app, select [facility], and log in with your MyChart username and password.  Due to Covid, a mask is required upon entering the hospital/clinic. If you do not have a mask, one will be given to you upon arrival. For doctor visits, patients may have 1 support person aged 5 or older with them. For treatment visits, patients cannot have anyone with them due to current Covid guidelines and our immunocompromised population.

## 2021-09-24 ENCOUNTER — Inpatient Hospital Stay: Payer: Federal, State, Local not specified - PPO

## 2021-09-24 VITALS — BP 117/74 | HR 89 | Temp 98.7°F | Resp 18

## 2021-09-24 DIAGNOSIS — C50312 Malignant neoplasm of lower-inner quadrant of left female breast: Secondary | ICD-10-CM

## 2021-09-24 DIAGNOSIS — Z5112 Encounter for antineoplastic immunotherapy: Secondary | ICD-10-CM | POA: Diagnosis not present

## 2021-09-24 DIAGNOSIS — Z79899 Other long term (current) drug therapy: Secondary | ICD-10-CM | POA: Diagnosis not present

## 2021-09-24 DIAGNOSIS — Z5111 Encounter for antineoplastic chemotherapy: Secondary | ICD-10-CM | POA: Diagnosis not present

## 2021-09-24 DIAGNOSIS — Z5189 Encounter for other specified aftercare: Secondary | ICD-10-CM | POA: Diagnosis not present

## 2021-09-24 DIAGNOSIS — K529 Noninfective gastroenteritis and colitis, unspecified: Secondary | ICD-10-CM | POA: Diagnosis not present

## 2021-09-24 DIAGNOSIS — Z86718 Personal history of other venous thrombosis and embolism: Secondary | ICD-10-CM | POA: Diagnosis not present

## 2021-09-24 DIAGNOSIS — Z170 Estrogen receptor positive status [ER+]: Secondary | ICD-10-CM | POA: Diagnosis not present

## 2021-09-24 MED ORDER — FILGRASTIM-AAFI 480 MCG/0.8ML IJ SOSY
480.0000 ug | PREFILLED_SYRINGE | Freq: Once | INTRAMUSCULAR | Status: AC
  Administered 2021-09-24: 480 ug via SUBCUTANEOUS
  Filled 2021-09-24: qty 0.8

## 2021-09-25 ENCOUNTER — Inpatient Hospital Stay: Payer: Federal, State, Local not specified - PPO

## 2021-09-25 ENCOUNTER — Other Ambulatory Visit: Payer: Self-pay

## 2021-09-25 ENCOUNTER — Inpatient Hospital Stay: Payer: Federal, State, Local not specified - PPO | Admitting: Licensed Clinical Social Worker

## 2021-09-25 VITALS — BP 119/69 | HR 95 | Temp 98.8°F | Resp 17

## 2021-09-25 DIAGNOSIS — Z170 Estrogen receptor positive status [ER+]: Secondary | ICD-10-CM | POA: Diagnosis not present

## 2021-09-25 DIAGNOSIS — K529 Noninfective gastroenteritis and colitis, unspecified: Secondary | ICD-10-CM | POA: Diagnosis not present

## 2021-09-25 DIAGNOSIS — Z79899 Other long term (current) drug therapy: Secondary | ICD-10-CM | POA: Diagnosis not present

## 2021-09-25 DIAGNOSIS — C50312 Malignant neoplasm of lower-inner quadrant of left female breast: Secondary | ICD-10-CM | POA: Diagnosis not present

## 2021-09-25 DIAGNOSIS — Z5112 Encounter for antineoplastic immunotherapy: Secondary | ICD-10-CM | POA: Diagnosis not present

## 2021-09-25 DIAGNOSIS — Z5189 Encounter for other specified aftercare: Secondary | ICD-10-CM | POA: Diagnosis not present

## 2021-09-25 DIAGNOSIS — Z86718 Personal history of other venous thrombosis and embolism: Secondary | ICD-10-CM | POA: Diagnosis not present

## 2021-09-25 DIAGNOSIS — Z5111 Encounter for antineoplastic chemotherapy: Secondary | ICD-10-CM | POA: Diagnosis not present

## 2021-09-25 MED ORDER — FILGRASTIM-AAFI 480 MCG/0.8ML IJ SOSY
480.0000 ug | PREFILLED_SYRINGE | Freq: Once | INTRAMUSCULAR | Status: AC
  Administered 2021-09-25: 480 ug via SUBCUTANEOUS
  Filled 2021-09-25: qty 0.8

## 2021-09-25 NOTE — Patient Instructions (Signed)
Filgrastim, G-CSF injection °What is this medication? °FILGRASTIM, G-CSF (fil GRA stim) is a granulocyte colony-stimulating factor that stimulates the growth of neutrophils, a type of white blood cell (WBC) important in the body's fight against infection. It is used to reduce the incidence of fever and infection in patients with certain types of cancer who are receiving chemotherapy that affects the bone marrow, to stimulate blood cell production for removal of WBCs from the body prior to a bone marrow transplantation, to reduce the incidence of fever and infection in patients who have severe chronic neutropenia, and to improve survival outcomes following high-dose radiation exposure that is toxic to the bone marrow. °This medicine may be used for other purposes; ask your health care provider or pharmacist if you have questions. °COMMON BRAND NAME(S): Neupogen, Nivestym, Releuko, Zarxio °What should I tell my care team before I take this medication? °They need to know if you have any of these conditions: °kidney disease °latex allergy °ongoing radiation therapy °sickle cell disease °an unusual or allergic reaction to filgrastim, pegfilgrastim, other medicines, foods, dyes, or preservatives °pregnant or trying to get pregnant °breast-feeding °How should I use this medication? °This medicine is for injection under the skin or infusion into a vein. As an infusion into a vein, it is usually given by a health care professional in a hospital or clinic setting. If you get this medicine at home, you will be taught how to prepare and give this medicine. Refer to the Instructions for Use that come with your medication packaging. Use exactly as directed. Take your medicine at regular intervals. Do not take your medicine more often than directed. °It is important that you put your used needles and syringes in a special sharps container. Do not put them in a trash can. If you do not have a sharps container, call your pharmacist  or healthcare provider to get one. °Talk to your pediatrician regarding the use of this medicine in children. While this drug may be prescribed for children as young as 7 months for selected conditions, precautions do apply. °Overdosage: If you think you have taken too much of this medicine contact a poison control center or emergency room at once. °NOTE: This medicine is only for you. Do not share this medicine with others. °What if I miss a dose? °It is important not to miss your dose. Call your doctor or health care professional if you miss a dose. °What may interact with this medication? °This medicine may interact with the following medications: °medicines that may cause a release of neutrophils, such as lithium °This list may not describe all possible interactions. Give your health care provider a list of all the medicines, herbs, non-prescription drugs, or dietary supplements you use. Also tell them if you smoke, drink alcohol, or use illegal drugs. Some items may interact with your medicine. °What should I watch for while using this medication? °Your condition will be monitored carefully while you are receiving this medicine. °You may need blood work done while you are taking this medicine. °Talk to your health care provider about your risk of cancer. You may be more at risk for certain types of cancer if you take this medicine. °What side effects may I notice from receiving this medication? °Side effects that you should report to your doctor or health care professional as soon as possible: °allergic reactions like skin rash, itching or hives, swelling of the face, lips, or tongue °back pain °dizziness or feeling faint °fever °pain, redness, or   irritation at site where injected °pinpoint red spots on the skin °shortness of breath or breathing problems °signs and symptoms of kidney injury like trouble passing urine, change in the amount of urine, or red or dark-brown urine °stomach or side pain, or pain at  the shoulder °swelling °tiredness °unusual bleeding or bruising °Side effects that usually do not require medical attention (report to your doctor or health care professional if they continue or are bothersome): °bone pain °cough °diarrhea °hair loss °headache °muscle pain °This list may not describe all possible side effects. Call your doctor for medical advice about side effects. You may report side effects to FDA at 1-800-FDA-1088. °Where should I keep my medication? °Keep out of the reach of children. °Store in a refrigerator between 2 and 8 degrees C (36 and 46 degrees F). Do not freeze. Keep in carton to protect from light. Throw away this medicine if vials or syringes are left out of the refrigerator for more than 24 hours. Throw away any unused medicine after the expiration date. °NOTE: This sheet is a summary. It may not cover all possible information. If you have questions about this medicine, talk to your doctor, pharmacist, or health care provider. °© 2022 [employer] (2021-04-07 00:00:00) ° °

## 2021-09-26 ENCOUNTER — Other Ambulatory Visit: Payer: Self-pay

## 2021-09-26 ENCOUNTER — Inpatient Hospital Stay: Payer: Federal, State, Local not specified - PPO

## 2021-09-26 VITALS — BP 128/92 | HR 102 | Temp 97.8°F

## 2021-09-26 DIAGNOSIS — C50312 Malignant neoplasm of lower-inner quadrant of left female breast: Secondary | ICD-10-CM | POA: Diagnosis not present

## 2021-09-26 DIAGNOSIS — Z79899 Other long term (current) drug therapy: Secondary | ICD-10-CM | POA: Diagnosis not present

## 2021-09-26 DIAGNOSIS — Z5111 Encounter for antineoplastic chemotherapy: Secondary | ICD-10-CM | POA: Diagnosis not present

## 2021-09-26 DIAGNOSIS — Z86718 Personal history of other venous thrombosis and embolism: Secondary | ICD-10-CM | POA: Diagnosis not present

## 2021-09-26 DIAGNOSIS — Z170 Estrogen receptor positive status [ER+]: Secondary | ICD-10-CM | POA: Diagnosis not present

## 2021-09-26 DIAGNOSIS — K529 Noninfective gastroenteritis and colitis, unspecified: Secondary | ICD-10-CM | POA: Diagnosis not present

## 2021-09-26 DIAGNOSIS — Z5189 Encounter for other specified aftercare: Secondary | ICD-10-CM | POA: Diagnosis not present

## 2021-09-26 DIAGNOSIS — Z5112 Encounter for antineoplastic immunotherapy: Secondary | ICD-10-CM | POA: Diagnosis not present

## 2021-09-26 MED ORDER — FILGRASTIM-AAFI 480 MCG/0.8ML IJ SOSY
480.0000 ug | PREFILLED_SYRINGE | Freq: Once | INTRAMUSCULAR | Status: AC
  Administered 2021-09-26: 480 ug via SUBCUTANEOUS

## 2021-09-26 NOTE — Patient Instructions (Signed)
Filgrastim, G-CSF injection °What is this medication? °FILGRASTIM, G-CSF (fil GRA stim) is a granulocyte colony-stimulating factor that stimulates the growth of neutrophils, a type of white blood cell (WBC) important in the body's fight against infection. It is used to reduce the incidence of fever and infection in patients with certain types of cancer who are receiving chemotherapy that affects the bone marrow, to stimulate blood cell production for removal of WBCs from the body prior to a bone marrow transplantation, to reduce the incidence of fever and infection in patients who have severe chronic neutropenia, and to improve survival outcomes following high-dose radiation exposure that is toxic to the bone marrow. °This medicine may be used for other purposes; ask your health care provider or pharmacist if you have questions. °COMMON BRAND NAME(S): Neupogen, Nivestym, Releuko, Zarxio °What should I tell my care team before I take this medication? °They need to know if you have any of these conditions: °kidney disease °latex allergy °ongoing radiation therapy °sickle cell disease °an unusual or allergic reaction to filgrastim, pegfilgrastim, other medicines, foods, dyes, or preservatives °pregnant or trying to get pregnant °breast-feeding °How should I use this medication? °This medicine is for injection under the skin or infusion into a vein. As an infusion into a vein, it is usually given by a health care professional in a hospital or clinic setting. If you get this medicine at home, you will be taught how to prepare and give this medicine. Refer to the Instructions for Use that come with your medication packaging. Use exactly as directed. Take your medicine at regular intervals. Do not take your medicine more often than directed. °It is important that you put your used needles and syringes in a special sharps container. Do not put them in a trash can. If you do not have a sharps container, call your pharmacist  or healthcare provider to get one. °Talk to your pediatrician regarding the use of this medicine in children. While this drug may be prescribed for children as young as 7 months for selected conditions, precautions do apply. °Overdosage: If you think you have taken too much of this medicine contact a poison control center or emergency room at once. °NOTE: This medicine is only for you. Do not share this medicine with others. °What if I miss a dose? °It is important not to miss your dose. Call your doctor or health care professional if you miss a dose. °What may interact with this medication? °This medicine may interact with the following medications: °medicines that may cause a release of neutrophils, such as lithium °This list may not describe all possible interactions. Give your health care provider a list of all the medicines, herbs, non-prescription drugs, or dietary supplements you use. Also tell them if you smoke, drink alcohol, or use illegal drugs. Some items may interact with your medicine. °What should I watch for while using this medication? °Your condition will be monitored carefully while you are receiving this medicine. °You may need blood work done while you are taking this medicine. °Talk to your health care provider about your risk of cancer. You may be more at risk for certain types of cancer if you take this medicine. °What side effects may I notice from receiving this medication? °Side effects that you should report to your doctor or health care professional as soon as possible: °allergic reactions like skin rash, itching or hives, swelling of the face, lips, or tongue °back pain °dizziness or feeling faint °fever °pain, redness, or   irritation at site where injected °pinpoint red spots on the skin °shortness of breath or breathing problems °signs and symptoms of kidney injury like trouble passing urine, change in the amount of urine, or red or dark-brown urine °stomach or side pain, or pain at  the shoulder °swelling °tiredness °unusual bleeding or bruising °Side effects that usually do not require medical attention (report to your doctor or health care professional if they continue or are bothersome): °bone pain °cough °diarrhea °hair loss °headache °muscle pain °This list may not describe all possible side effects. Call your doctor for medical advice about side effects. You may report side effects to FDA at 1-800-FDA-1088. °Where should I keep my medication? °Keep out of the reach of children. °Store in a refrigerator between 2 and 8 degrees C (36 and 46 degrees F). Do not freeze. Keep in carton to protect from light. Throw away this medicine if vials or syringes are left out of the refrigerator for more than 24 hours. Throw away any unused medicine after the expiration date. °NOTE: This sheet is a summary. It may not cover all possible information. If you have questions about this medicine, talk to your doctor, pharmacist, or health care provider. °© 2022 [employer] (2021-04-07 00:00:00) ° °

## 2021-09-29 DIAGNOSIS — R7303 Prediabetes: Secondary | ICD-10-CM | POA: Diagnosis not present

## 2021-09-29 DIAGNOSIS — Z131 Encounter for screening for diabetes mellitus: Secondary | ICD-10-CM | POA: Diagnosis not present

## 2021-09-29 MED FILL — Fosaprepitant Dimeglumine For IV Infusion 150 MG (Base Eq): INTRAVENOUS | Qty: 5 | Status: AC

## 2021-09-30 ENCOUNTER — Other Ambulatory Visit: Payer: Self-pay

## 2021-09-30 ENCOUNTER — Inpatient Hospital Stay: Payer: Federal, State, Local not specified - PPO

## 2021-09-30 ENCOUNTER — Inpatient Hospital Stay: Payer: Federal, State, Local not specified - PPO | Attending: Hematology

## 2021-09-30 VITALS — BP 140/86 | HR 97 | Temp 98.6°F | Resp 16 | Wt 198.0 lb

## 2021-09-30 DIAGNOSIS — Z5111 Encounter for antineoplastic chemotherapy: Secondary | ICD-10-CM | POA: Diagnosis not present

## 2021-09-30 DIAGNOSIS — Z5112 Encounter for antineoplastic immunotherapy: Secondary | ICD-10-CM | POA: Insufficient documentation

## 2021-09-30 DIAGNOSIS — C50312 Malignant neoplasm of lower-inner quadrant of left female breast: Secondary | ICD-10-CM

## 2021-09-30 DIAGNOSIS — Z95828 Presence of other vascular implants and grafts: Secondary | ICD-10-CM

## 2021-09-30 DIAGNOSIS — Z171 Estrogen receptor negative status [ER-]: Secondary | ICD-10-CM | POA: Insufficient documentation

## 2021-09-30 DIAGNOSIS — Z5189 Encounter for other specified aftercare: Secondary | ICD-10-CM | POA: Insufficient documentation

## 2021-09-30 DIAGNOSIS — Z79899 Other long term (current) drug therapy: Secondary | ICD-10-CM | POA: Insufficient documentation

## 2021-09-30 LAB — CMP (CANCER CENTER ONLY)
ALT: 58 U/L — ABNORMAL HIGH (ref 0–44)
AST: 32 U/L (ref 15–41)
Albumin: 4 g/dL (ref 3.5–5.0)
Alkaline Phosphatase: 110 U/L (ref 38–126)
Anion gap: 8 (ref 5–15)
BUN: 15 mg/dL (ref 6–20)
CO2: 27 mmol/L (ref 22–32)
Calcium: 9.1 mg/dL (ref 8.9–10.3)
Chloride: 104 mmol/L (ref 98–111)
Creatinine: 0.87 mg/dL (ref 0.44–1.00)
GFR, Estimated: >60 mL/min (ref >60)
Glucose, Bld: 99 mg/dL (ref 70–99)
Potassium: 3.7 mmol/L (ref 3.5–5.1)
Sodium: 139 mmol/L (ref 135–145)
Total Bilirubin: 0.4 mg/dL (ref 0.3–1.2)
Total Protein: 7.6 g/dL (ref 6.5–8.1)

## 2021-09-30 LAB — CBC WITH DIFFERENTIAL (CANCER CENTER ONLY)
Abs Immature Granulocytes: 0.06 10*3/uL (ref 0.00–0.07)
Basophils Absolute: 0.1 10*3/uL (ref 0.0–0.1)
Basophils Relative: 1 %
Eosinophils Absolute: 0.1 10*3/uL (ref 0.0–0.5)
Eosinophils Relative: 2 %
HCT: 33.7 % — ABNORMAL LOW (ref 36.0–46.0)
Hemoglobin: 11.1 g/dL — ABNORMAL LOW (ref 12.0–15.0)
Immature Granulocytes: 1 %
Lymphocytes Relative: 39 %
Lymphs Abs: 2.6 10*3/uL (ref 0.7–4.0)
MCH: 28.2 pg (ref 26.0–34.0)
MCHC: 32.9 g/dL (ref 30.0–36.0)
MCV: 85.8 fL (ref 80.0–100.0)
Monocytes Absolute: 0.5 10*3/uL (ref 0.1–1.0)
Monocytes Relative: 8 %
Neutro Abs: 3.3 10*3/uL (ref 1.7–7.7)
Neutrophils Relative %: 49 %
Platelet Count: 165 10*3/uL (ref 150–400)
RBC: 3.93 MIL/uL (ref 3.87–5.11)
RDW: 13.2 % (ref 11.5–15.5)
WBC Count: 6.6 10*3/uL (ref 4.0–10.5)
nRBC: 0 % (ref 0.0–0.2)

## 2021-09-30 MED ORDER — PALONOSETRON HCL INJECTION 0.25 MG/5ML
0.2500 mg | Freq: Once | INTRAVENOUS | Status: AC
  Administered 2021-09-30: 0.25 mg via INTRAVENOUS
  Filled 2021-09-30: qty 5

## 2021-09-30 MED ORDER — FAMOTIDINE IN NACL 20-0.9 MG/50ML-% IV SOLN
20.0000 mg | Freq: Once | INTRAVENOUS | Status: AC
  Administered 2021-09-30: 20 mg via INTRAVENOUS
  Filled 2021-09-30: qty 50

## 2021-09-30 MED ORDER — SODIUM CHLORIDE 0.9 % IV SOLN
750.0000 mg | Freq: Once | INTRAVENOUS | Status: AC
  Administered 2021-09-30: 750 mg via INTRAVENOUS
  Filled 2021-09-30: qty 75

## 2021-09-30 MED ORDER — SODIUM CHLORIDE 0.9 % IV SOLN
200.0000 mg | Freq: Once | INTRAVENOUS | Status: AC
  Administered 2021-09-30: 200 mg via INTRAVENOUS
  Filled 2021-09-30: qty 200

## 2021-09-30 MED ORDER — DIPHENHYDRAMINE HCL 50 MG/ML IJ SOLN
25.0000 mg | Freq: Once | INTRAMUSCULAR | Status: AC
  Administered 2021-09-30: 25 mg via INTRAVENOUS
  Filled 2021-09-30: qty 1

## 2021-09-30 MED ORDER — SODIUM CHLORIDE 0.9% FLUSH
10.0000 mL | Freq: Once | INTRAVENOUS | Status: AC
  Administered 2021-09-30: 10 mL

## 2021-09-30 MED ORDER — SODIUM CHLORIDE 0.9 % IV SOLN
150.0000 mg | Freq: Once | INTRAVENOUS | Status: AC
  Administered 2021-09-30: 150 mg via INTRAVENOUS
  Filled 2021-09-30: qty 150

## 2021-09-30 MED ORDER — SODIUM CHLORIDE 0.9 % IV SOLN
80.0000 mg/m2 | Freq: Once | INTRAVENOUS | Status: AC
  Administered 2021-09-30: 162 mg via INTRAVENOUS
  Filled 2021-09-30: qty 27

## 2021-09-30 MED ORDER — HEPARIN SOD (PORK) LOCK FLUSH 100 UNIT/ML IV SOLN
500.0000 [IU] | Freq: Once | INTRAVENOUS | Status: AC | PRN
  Administered 2021-09-30: 500 [IU]

## 2021-09-30 MED ORDER — SODIUM CHLORIDE 0.9 % IV SOLN: Freq: Once | INTRAVENOUS | Status: AC

## 2021-09-30 MED ORDER — SODIUM CHLORIDE 0.9% FLUSH
10.0000 mL | INTRAVENOUS | Status: DC | PRN
  Administered 2021-09-30: 10 mL

## 2021-09-30 NOTE — Patient Instructions (Signed)
[facility]  Discharge Instructions: ?Thank you for choosing [facility] to provide your oncology and hematology care.  ? ?If you have a lab appointment with the [facility], please go directly to the [facility] and check in at the registration area. ?  ?Wear comfortable clothing and clothing appropriate for easy access to any Portacath or PICC line.  ? ?We strive to give you quality time with your provider. You may need to reschedule your appointment if you arrive late (15 or more minutes).  Arriving late affects you and other patients whose appointments are after yours.  Also, if you miss three or more appointments without notifying the office, you may be dismissed from the clinic at the provider?s discretion.    ?  ?For prescription refill requests, have your pharmacy contact our office and allow 72 hours for refills to be completed.   ? ?Today you received the following chemotherapy and/or immunotherapy agents: Keytruda/Taxol/Carboplatin ?  ?To help prevent nausea and vomiting after your treatment, we encourage you to take your nausea medication as directed. ? ?BELOW ARE SYMPTOMS THAT SHOULD BE REPORTED IMMEDIATELY: ?*FEVER GREATER THAN 100.4 F (38 ?C) OR HIGHER ?*CHILLS OR SWEATING ?*NAUSEA AND VOMITING THAT IS NOT CONTROLLED WITH YOUR NAUSEA MEDICATION ?*UNUSUAL SHORTNESS OF BREATH ?*UNUSUAL BRUISING OR BLEEDING ?*URINARY PROBLEMS (pain or burning when urinating, or frequent urination) ?*BOWEL PROBLEMS (unusual diarrhea, constipation, pain near the anus) ?TENDERNESS IN MOUTH AND THROAT WITH OR WITHOUT PRESENCE OF ULCERS (sore throat, sores in mouth, or a toothache) ?UNUSUAL RASH, SWELLING OR PAIN  ?UNUSUAL VAGINAL DISCHARGE OR ITCHING  ? ?Items with * indicate a potential emergency and should be followed up as soon as possible or go to the Emergency Department if any problems should occur. ? ?Please show the CHEMOTHERAPY ALERT CARD or IMMUNOTHERAPY ALERT CARD  at check-in to the Emergency Department and triage nurse. ? ?Should you have questions after your visit or need to cancel or reschedule your appointment, please contact [facility]  Dept: [phone]  and follow the prompts.  Office hours are 8:00 a.m. to 4:30 p.m. Monday - Friday. Please note that voicemails left after 4:00 p.m. may not be returned until the following business day.  We are closed weekends and major holidays. You have access to a nurse at all times for urgent questions. Please call the main number to the clinic Dept: [phone] and follow the prompts. ? ? ?For any non-urgent questions, you may also contact your provider using MyChart. We now offer e-Visits for anyone 69 and older to request care online for non-urgent symptoms. For details visit [email or web address]. ?  ?Also download the MyChart app! Go to the app store, search "MyChart", open the app, select [facility], and log in with your MyChart username and password. ? ?Due to Covid, a mask is required upon entering the hospital/clinic. If you do not have a mask, one will be given to you upon arrival. For doctor visits, patients may have 1 support person aged 62 or older with them. For treatment visits, patients cannot have anyone with them due to current Covid guidelines and our immunocompromised population.  ? ?

## 2021-10-06 NOTE — Progress Notes (Signed)
[facility]   Telephone:([phone]) [phone] Fax:(336) [phone]   Clinic Follow up Note   Patient Care Team: Billie Ruddy, MD as PCP - General (Family Medicine) Erroll Luna, MD as Consulting Physician (General Surgery) Truitt Merle, MD as Consulting Physician (Hematology) Gery Pray, MD as Consulting Physician (Radiation Oncology) 10/07/2021  CHIEF COMPLAINT: Follow-up functionally triple negative left breast cancer  SUMMARY OF ONCOLOGIC HISTORY: Oncology History Overview Note   Cancer Staging  Malignant neoplasm of lower-inner quadrant of left breast in female, estrogen receptor positive ([place]) Staging form: Breast, AJCC 8th Edition - Clinical stage from 08/07/2021: Stage IIIA (cT2, cN1, cM0, G3, ER+, PR-, HER2-) - Signed by Truitt Merle, MD on 08/18/2021    Malignant neoplasm of lower-inner quadrant of left breast in female, estrogen receptor positive ([place])  08/07/2021 Cancer Staging   Staging form: Breast, AJCC 8th Edition - Clinical stage from 08/07/2021: Stage IIB (cT2, cN0, cM0, G3, ER-, PR-, HER2-) - Signed by Truitt Merle, MD on 09/24/2021 Stage prefix: Initial diagnosis Histologic grading system: 3 grade system    08/07/2021 Mammogram   CLINICAL DATA:  Palpable mass in the LEFT breast since August.   EXAM: DIGITAL DIAGNOSTIC BILATERAL MAMMOGRAM WITH TOMOSYNTHESIS AND CAD; ULTRASOUND LEFT BREAST LIMITED  IMPRESSION: 1. Suspicious mass in the 7:30 o'clock location of the LEFT breast for which biopsy is recommended. 2. LEFT axillary adenopathy, with at least 4 abnormal lymph nodes.   08/07/2021 Initial Biopsy   Diagnosis 1. Breast, left, needle core biopsy, 7:30, ribbon clip - INVASIVE DUCTAL CARCINOMA - SEE COMMENT 2. Lymph node, needle/core biopsy, left axilla, coil clip - NO CARCINOMA IDENTIFIED Microscopic Comment 1. based on the biopsy, the carcinoma appears Nottingham grade 3 of 3 and measures 1.1 cm in greatest linear extent.  1. PROGNOSTIC  INDICATORS Results: The tumor cells are EQUIVOCAL for Her2 (2+). Her2 by FISH will be performed and results reported separately. Estrogen Receptor: 2%, POSITIVE, WEAK STAINING INTENSITY Progesterone Receptor: <1%, NEGATIVE Proliferation Marker Ki67: 40%  1. FLUORESCENCE IN-SITU HYBRIDIZATION Results: GROUP 5: HER2 **NEGATIVE**   08/17/2021 Initial Diagnosis   Malignant neoplasm of lower-inner quadrant of left breast in female, estrogen receptor positive ([place])   08/19/2021 Genetic Testing   [facility] CancerNext-Expanded Panel is Negative. Report date is 09/02/2021.  The CancerNext-Expanded gene panel offered by [facility] and includes sequencing, rearrangement, and RNA analysis for the following 77 genes: AIP, ALK, APC, ATM, AXIN2, BAP1, BARD1, BLM, BMPR1A, BRCA1, BRCA2, BRIP1, CDC73, CDH1, CDK4, CDKN1B, CDKN2A, CHEK2, CTNNA1, DICER1, FANCC, FH, FLCN, GALNT12, KIF1B, LZTR1, MAX, MEN1, MET, MLH1, MSH2, MSH3, MSH6, MUTYH, NBN, NF1, NF2, NTHL1, PALB2, PHOX2B, PMS2, POT1, PRKAR1A, PTCH1, PTEN, RAD51C, RAD51D, RB1, RECQL, RET, SDHA, SDHAF2, SDHB, SDHC, SDHD, SMAD4, SMARCA4, SMARCB1, SMARCE1, STK11, SUFU, TMEM127, TP53, TSC1, TSC2, VHL and XRCC2 (sequencing and deletion/duplication); EGFR, EGLN1, HOXB13, KIT, MITF, PDGFRA, POLD1, and POLE (sequencing only); EPCAM and GREM1 (deletion/duplication only).    08/26/2021 Imaging   EXAM: BILATERAL BREAST MRI WITH AND WITHOUT CONTRAST  IMPRESSION: 2.3 cm mass in the lower-inner quadrant of the left breast corresponding with the biopsy proven invasive ductal carcinoma.   08/31/2021 PET scan   IMPRESSION: 1. Known mass medially in the left breast is markedly hypermetabolic consistent with breast cancer. 2. No evidence of nodal metastases within the chest. No typical distant metastases. 3. Suspected acute inflammatory process involving central mesentery and pelvis with ill-defined focal hypermetabolic activity anterior to the sacrum. There are pelvic  inflammatory changes and free pelvic fluid.  Recent labs demonstrate mild leukocytosis. Differential considerations include acute appendicitis, enteritis and small perforated foreign body. No evidence of drainable fluid collection, pneumoperitoneum or bowel obstruction. 4. These results were called by telephone at the time of interpretation on 08/31/2021 at 5:12 pm to on-call oncology nurse, [name], who verbally acknowledged these results. Unless there is a good clinical explanation for these findings, evaluation in the emergency department is likely warranted, and patient may benefit from abdominopelvic CT with oral and intravenous contrast.   09/01/2021 Imaging   EXAM: CT ABDOMEN AND PELVIS WITH CONTRAST  IMPRESSION: Multiple thick-walled loops of distal small bowel, suggesting infectious/inflammatory enteritis. Associated small volume pelvic ascites. No pneumatosis or free air.   No evidence of bowel obstruction.  Normal appendix.   2.1 cm lesion in the medial left breast, likely corresponding to the patient's known primary breast neoplasm. No findings suspicious for metastatic disease.   09/09/2021 -  Chemotherapy   Patient is on Treatment Plan : BREAST Pembrolizumab (200) D1 + Carboplatin (5) D1 + Paclitaxel (80) D1,8,15 q21d X 4 cycles / Pembrolizumab (200) D1 + AC D1 q21d x 4 cycles       CURRENT THERAPY: Neoadjuvant weekly taxol x12 plus carbo every 3 weeks x4, to be followed by [facility] every 2 weeks x4 and pembrolizumab every 3 weeks to total 1 year  INTERVAL HISTORY: Cheyenne [name] returns with her wife, Cheyenne [name], for follow-up and treatment as scheduled.  Last seen by Dr. Burr Medico 09/16/2021 with cycle 1 day 8 Taxol.  She began cycle 2 with carbo/Taxol and pembrolizumab 09/30/2021. She was very tired the next day, slept mostly. She also had unsettled stomach but not nausea or vomiting. She recovered after that day. Able to be up and active at home, eating and drinking well. Bowels moving.  She  has occasional bilateral rib pain, last episode may be yesterday or the day before, does not require medication and is "not too bad."  She also had an episode of right breast pain.  She thinks the left breast mass is shrinking. She had lockjaw 1 time that eventually eased.  Otherwise, denies fever, chills, cough, chest pain, dyspnea, rash, mucositis, neuropathy, or any other new specific complaints.   MEDICAL HISTORY:  Past Medical History:  Diagnosis Date   Anal fissure    Anxiety    Breast cancer ([place]) 08/10/21   Date diagnosis was given   DVT (deep venous thrombosis) ([place])    Incomplete right bundle branch block (RBBB) determined by electrocardiography 09/01/2021    SURGICAL HISTORY: Past Surgical History:  Procedure Laterality Date   NO PAST SURGERIES     PORTACATH PLACEMENT N/A 09/08/2021   Procedure: INSERTION PORT-A-CATH;  Surgeon: Erroll Luna, MD;  Location: [facility];  Service: General;  Laterality: N/A;    I have reviewed the social history and family history with the patient and they are unchanged from previous note.  ALLERGIES:  has No Known Allergies.  MEDICATIONS:  Current Outpatient Medications  Medication Sig Dispense Refill   acetaminophen (TYLENOL) 500 MG tablet Take 500-1,000 mg by mouth every 6 (six) hours as needed (pain.).     ALAYCEN 1/35 tablet Take 1 tablet by mouth in the morning.  4   albuterol (VENTOLIN HFA) 108 (90 Base) MCG/ACT inhaler Inhale 2 puffs into the lungs every 4 (four) hours as needed for wheezing. 1 each 5   ALPRAZolam (XANAX) 0.5 MG tablet Take one tablet twice daily as needed for anxiety. 45 tablet 2   cyclobenzaprine (  FLEXERIL) 10 MG tablet Take 10 mg by mouth daily as needed for muscle spasms.     desonide (DESOWEN) 0.05 % cream Apply topically 2 (two) times daily. (Patient taking differently: Apply 1 application topically 2 (two) times daily as needed (skin irritation.).) 30 g 1   desonide (DESOWEN) 0.05 % lotion Apply 1 application  topically daily.     hydroquinone 4 % cream APPLY TO DARK SPOTS AT BEDTIME (Patient taking differently: Apply 1 application topically daily as needed (dark spots). APPLY TO DARK SPOTS AT BEDTIME) 28.35 g 0   ibuprofen (ADVIL) 800 MG tablet Take 800 mg by mouth every 8 (eight) hours as needed for moderate pain.     ibuprofen (ADVIL) 800 MG tablet Take 1 tablet (800 mg total) by mouth every 8 (eight) hours as needed. 30 tablet 0   lidocaine-prilocaine (EMLA) cream Apply to affected area once 30 g 3   loratadine (CLARITIN) 10 MG tablet Take 10 mg by mouth daily.     Multiple Vitamins-Minerals (ONE A DAY IMMUNITY DEFENSE) CHEW Chew 1 tablet by mouth daily.     ondansetron (ZOFRAN) 8 MG tablet Take 1 tablet (8 mg total) by mouth 2 (two) times daily as needed. Start on the third day after carboplatin and AC chemotherapy. 30 tablet 1   oxyCODONE (OXY IR/ROXICODONE) 5 MG immediate release tablet Take 1 tablet (5 mg total) by mouth every 6 (six) hours as needed for severe pain. 15 tablet 0   prochlorperazine (COMPAZINE) 10 MG tablet Take 1 tablet (10 mg total) by mouth every 6 (six) hours as needed (Nausea or vomiting). 30 tablet 1   traMADol (ULTRAM) 50 MG tablet Take 1 tablet (50 mg total) by mouth every 6 (six) hours as needed. 5 tablet 0   No current facility-administered medications for this visit.    PHYSICAL EXAMINATION: ECOG PERFORMANCE STATUS: 1 - Symptomatic but completely ambulatory  Vitals:   10/07/21 1020  BP: 126/75  Pulse: (!) 107  Resp: 18  Temp: 98.6 F (37 C)  SpO2: 100%   Filed Weights   10/07/21 1020  Weight: 198 lb 6.4 oz (90 kg)    GENERAL:alert, no distress and comfortable SKIN: No rash EYES: sclera clear NECK: Without mass LYMPH:  no palpable cervical or supraclavicular lymphadenopathy  LUNGS:  normal breathing effort HEART: rno lower extremity edema ABDOMEN:abdomen soft, non-tender and normal bowel sounds NEURO: alert & oriented x 3 with fluent speech, no focal  motor deficits Breast exam: Breasts are symmetrical without nipple discharge or inversion.  The palpable left breast mass in the lower inner quadrant measures approximately 1 cm.  No other palpable mass or abnormality in either breast or axilla that I could appreciate.  Right breast benign.  LABORATORY DATA:  I have reviewed the data as listed CBC Latest Ref Rng & Units 10/07/2021 09/30/2021 09/23/2021  WBC 4.0 - 10.5 K/uL 4.8 6.6 5.6  Hemoglobin 12.0 - 15.0 g/dL 10.8(L) 11.1(L) 11.7(L)  Hematocrit 36.0 - 46.0 % 31.7(L) 33.7(L) 34.6(L)  Platelets 150 - 400 K/uL 218 165 227     CMP Latest Ref Rng & Units 10/07/2021 09/30/2021 09/23/2021  Glucose 70 - 99 mg/dL 120(H) 99 138(H)  BUN 6 - 20 mg/dL _0 Creatinine 0.44 - 1.00 mg/dL 0.75 0.87 0.83  Sodium 135 - 145 mmol/L 138 139 139  Potassium 3.5 - 5.1 mmol/L 3.6 3.7 3.4(L)  Chloride 98 - 111 mmol/L 102 104 104  CO2 22 - 32 mmol/L 29  27 26  Calcium 8.9 - 10.3 mg/dL 9.3 9.1 9.3  Total Protein 6.5 - 8.1 g/dL 7.8 7.6 7.8  Total Bilirubin 0.3 - 1.2 mg/dL 0.6 0.4 0.5  Alkaline Phos 38 - 126 U/L 105 110 95  AST 15 - 41 U/L 24 32 27  ALT 0 - 44 U/L 33 58(H) 42      RADIOGRAPHIC STUDIES: I have personally reviewed the radiological images as listed and agreed with the findings in the report. No results found.   ASSESSMENT & PLAN: Cheyenne [name] is a 41 y.o. female with    1. Malignant neoplasm of lower-inner quadrant of left breast, Stage IIIA, c(T2, N1), Functionally triple negative, Grade 3 -presented with palpable left breast mass. B/l MM and left [place] on 08/07/21 showed: 2.8 cm left breast mass at 7:30; at least 4 abnormal lymph nodes. Biopsy that day confirmed IDC in breast but node was negative. -breast MRI on 08/26/21 and PET on 08/31/21 were negative aside from primary mass. -Her breast cancer is ER 2% positive, PR and HER2 negative, this is similar to triple negative breast cancer.   -port placed 09/08/21 -she began neoadjuvant weekly  Taxol on days 1, 8, 15, and carbo on day 1 q. 21 days with PEG filgrastim on days 16, 17, and 18 and keytruda on 09/09/21. This will be followed by dose dense Adriamycin and Cytoxan every 2 weeks for 4 cycles, along with immunotherapy Keytruda every 3 weeks for 1 year.  -she tolerated cycle one well overall except mild fatigue and some nonspecific bilateral rib cage pain which could be related to Taxol.  -S/p cycle 2-day 1 the left breast mass measures approximately 1 cm, improved on treatment consistent with a clinical response to chemo  2. Social Support -she has good support from her wife. -she has a history of anxiety and is on Xanax as needed.  -Follow-up social work as needed   3. Transaminitis -She was noticed to have AST 90 and ALT 174 on labs 09/09/2021 on the day she started treatment today, previously normal -LFTs are normal today  Disposition: Ms. Kaleta appears stable.  S/p cycle 2-day 1 carbo/Taxol and every 3 week pembrolizumab.  She is tolerating treatment very well with fatigue and nonspecific bone pain for 1 day.  Side effects are adequately managed with supportive care at home.  She is able to recover and function well.  The left breast mass is smaller, right breast is benign, consistent with a clinical response to neoadjuvant chemo.  We reviewed her labs, med list, and schedule in detail.  All questions were answered.  She will proceed with cycle 2-day 8 Taxol today as planned, same dose.  She will return for cycle 2-day 15 Taxol next week, then injection x3 days.  Next follow-up in 2 weeks with cycle 3-day 1 carbo/Taxol and pembrolizumab.   All questions were answered. The patient knows to call the clinic with any problems, questions or concerns. No barriers to learning was detected. I spent 20 minutes counseling the patient face to face. The total time spent in the appointment was 30 minutes and more than 50% was on counseling and review of test results     Alla Feeling,  NP 10/07/21

## 2021-10-07 ENCOUNTER — Other Ambulatory Visit: Payer: Self-pay

## 2021-10-07 ENCOUNTER — Inpatient Hospital Stay: Payer: Federal, State, Local not specified - PPO

## 2021-10-07 ENCOUNTER — Inpatient Hospital Stay: Payer: Federal, State, Local not specified - PPO | Admitting: Nurse Practitioner

## 2021-10-07 ENCOUNTER — Encounter: Payer: Self-pay | Admitting: Nurse Practitioner

## 2021-10-07 VITALS — BP 126/75 | HR 107 | Temp 98.6°F | Resp 18 | Ht 62.0 in | Wt 198.4 lb

## 2021-10-07 VITALS — HR 93

## 2021-10-07 DIAGNOSIS — C50312 Malignant neoplasm of lower-inner quadrant of left female breast: Secondary | ICD-10-CM

## 2021-10-07 DIAGNOSIS — Z95828 Presence of other vascular implants and grafts: Secondary | ICD-10-CM

## 2021-10-07 DIAGNOSIS — Z170 Estrogen receptor positive status [ER+]: Secondary | ICD-10-CM | POA: Diagnosis not present

## 2021-10-07 DIAGNOSIS — Z5111 Encounter for antineoplastic chemotherapy: Secondary | ICD-10-CM | POA: Diagnosis not present

## 2021-10-07 DIAGNOSIS — Z5189 Encounter for other specified aftercare: Secondary | ICD-10-CM | POA: Diagnosis not present

## 2021-10-07 DIAGNOSIS — Z5112 Encounter for antineoplastic immunotherapy: Secondary | ICD-10-CM | POA: Diagnosis not present

## 2021-10-07 DIAGNOSIS — Z79899 Other long term (current) drug therapy: Secondary | ICD-10-CM | POA: Diagnosis not present

## 2021-10-07 DIAGNOSIS — Z171 Estrogen receptor negative status [ER-]: Secondary | ICD-10-CM | POA: Diagnosis not present

## 2021-10-07 LAB — CBC WITH DIFFERENTIAL (CANCER CENTER ONLY)
Abs Immature Granulocytes: 0.01 10*3/uL (ref 0.00–0.07)
Basophils Absolute: 0 10*3/uL (ref 0.0–0.1)
Basophils Relative: 1 %
Eosinophils Absolute: 0.1 10*3/uL (ref 0.0–0.5)
Eosinophils Relative: 3 %
HCT: 31.7 % — ABNORMAL LOW (ref 36.0–46.0)
Hemoglobin: 10.8 g/dL — ABNORMAL LOW (ref 12.0–15.0)
Immature Granulocytes: 0 %
Lymphocytes Relative: 48 %
Lymphs Abs: 2.4 10*3/uL (ref 0.7–4.0)
MCH: 28.8 pg (ref 26.0–34.0)
MCHC: 34.1 g/dL (ref 30.0–36.0)
MCV: 84.5 fL (ref 80.0–100.0)
Monocytes Absolute: 0.1 10*3/uL (ref 0.1–1.0)
Monocytes Relative: 3 %
Neutro Abs: 2.2 10*3/uL (ref 1.7–7.7)
Neutrophils Relative %: 45 %
Platelet Count: 218 10*3/uL (ref 150–400)
RBC: 3.75 MIL/uL — ABNORMAL LOW (ref 3.87–5.11)
RDW: 13.2 % (ref 11.5–15.5)
WBC Count: 4.8 10*3/uL (ref 4.0–10.5)
nRBC: 0 % (ref 0.0–0.2)

## 2021-10-07 LAB — CMP (CANCER CENTER ONLY)
ALT: 33 U/L (ref 0–44)
AST: 24 U/L (ref 15–41)
Albumin: 4.1 g/dL (ref 3.5–5.0)
Alkaline Phosphatase: 105 U/L (ref 38–126)
Anion gap: 7 (ref 5–15)
BUN: 12 mg/dL (ref 6–20)
CO2: 29 mmol/L (ref 22–32)
Calcium: 9.3 mg/dL (ref 8.9–10.3)
Chloride: 102 mmol/L (ref 98–111)
Creatinine: 0.75 mg/dL (ref 0.44–1.00)
GFR, Estimated: >60 mL/min (ref >60)
Glucose, Bld: 120 mg/dL — ABNORMAL HIGH (ref 70–99)
Potassium: 3.6 mmol/L (ref 3.5–5.1)
Sodium: 138 mmol/L (ref 135–145)
Total Bilirubin: 0.6 mg/dL (ref 0.3–1.2)
Total Protein: 7.8 g/dL (ref 6.5–8.1)

## 2021-10-07 MED ORDER — SODIUM CHLORIDE 0.9% FLUSH
10.0000 mL | INTRAVENOUS | Status: DC | PRN
  Administered 2021-10-07: 10 mL

## 2021-10-07 MED ORDER — FAMOTIDINE IN NACL 20-0.9 MG/50ML-% IV SOLN
20.0000 mg | Freq: Once | INTRAVENOUS | Status: AC
  Administered 2021-10-07: 20 mg via INTRAVENOUS
  Filled 2021-10-07: qty 50

## 2021-10-07 MED ORDER — SODIUM CHLORIDE 0.9% FLUSH
10.0000 mL | Freq: Once | INTRAVENOUS | Status: AC
  Administered 2021-10-07: 10 mL

## 2021-10-07 MED ORDER — DIPHENHYDRAMINE HCL 50 MG/ML IJ SOLN
25.0000 mg | Freq: Once | INTRAMUSCULAR | Status: AC
  Administered 2021-10-07: 25 mg via INTRAVENOUS
  Filled 2021-10-07: qty 1

## 2021-10-07 MED ORDER — SODIUM CHLORIDE 0.9 % IV SOLN
80.0000 mg/m2 | Freq: Once | INTRAVENOUS | Status: AC
  Administered 2021-10-07: 162 mg via INTRAVENOUS
  Filled 2021-10-07: qty 27

## 2021-10-07 MED ORDER — HEPARIN SOD (PORK) LOCK FLUSH 100 UNIT/ML IV SOLN
500.0000 [IU] | Freq: Once | INTRAVENOUS | Status: AC | PRN
  Administered 2021-10-07: 500 [IU]

## 2021-10-07 MED ORDER — SODIUM CHLORIDE 0.9 % IV SOLN: Freq: Once | INTRAVENOUS | Status: AC

## 2021-10-07 NOTE — Patient Instructions (Signed)
[facility] MEDICAL ONCOLOGY   Discharge Instructions: Thank you for choosing [facility] to provide your oncology and hematology care.   If you have a lab appointment with the Cancer Center, please go directly to the Cancer Center and check in at the registration area.   Wear comfortable clothing and clothing appropriate for easy access to any Portacath or PICC line.   We strive to give you quality time with your provider. You may need to reschedule your appointment if you arrive late (15 or more minutes).  Arriving late affects you and other patients whose appointments are after yours.  Also, if you miss three or more appointments without notifying the office, you may be dismissed from the clinic at the provider's discretion.      For prescription refill requests, have your pharmacy contact our office and allow 72 hours for refills to be completed.    Today you received the following chemotherapy and/or immunotherapy agents: paclitaxel.      To help prevent nausea and vomiting after your treatment, we encourage you to take your nausea medication as directed.  BELOW ARE SYMPTOMS THAT SHOULD BE REPORTED IMMEDIATELY: *FEVER GREATER THAN 100.4 F (38 C) OR HIGHER *CHILLS OR SWEATING *NAUSEA AND VOMITING THAT IS NOT CONTROLLED WITH YOUR NAUSEA MEDICATION *UNUSUAL SHORTNESS OF BREATH *UNUSUAL BRUISING OR BLEEDING *URINARY PROBLEMS (pain or burning when urinating, or frequent urination) *BOWEL PROBLEMS (unusual diarrhea, constipation, pain near the anus) TENDERNESS IN MOUTH AND THROAT WITH OR WITHOUT PRESENCE OF ULCERS (sore throat, sores in mouth, or a toothache) UNUSUAL RASH, SWELLING OR PAIN  UNUSUAL VAGINAL DISCHARGE OR ITCHING   Items with * indicate a potential emergency and should be followed up as soon as possible or go to the Emergency Department if any problems should occur.  Please show the CHEMOTHERAPY ALERT CARD or IMMUNOTHERAPY ALERT CARD at check-in  to the Emergency Department and triage nurse.  Should you have questions after your visit or need to cancel or reschedule your appointment, please contact [facility] MEDICAL ONCOLOG[facility]  Dept: [phone]  and follow the prompts.  Office hours are 8:00 a.m. to 4:30 p.m. Monday - Friday. Please note that voicemails left after 4:00 p.m. may not be returned until the following business day.  We are closed weekends and major holidays. You have access to a nurse at all times for urgent questions. Please call the main number to the clinic Dept: [phone] and follow the prompts.   For any non-urgent questions, you may also contact your provider using MyChart. We now offer e-Visits for anyone 18 and older to request care online for non-urgent symptoms. For details visit [email or web address].   Also download the MyChart app! Go to the app store, search "MyChart", open the app, select [facility], and log in with your MyChart username and password.  Due to Covid, a mask is required upon entering the hospital/clinic. If you do not have a mask, one will be given to you upon arrival. For doctor visits, patients may have 1 support person aged 18 or older with them. For treatment visits, patients cannot have anyone with them due to current Covid guidelines and our immunocompromised population.   

## 2021-10-08 ENCOUNTER — Telehealth: Payer: Self-pay | Admitting: Hematology

## 2021-10-08 NOTE — Telephone Encounter (Signed)
Scheduled follow-up appointments per 3/8 los. Patient is aware. ?

## 2021-10-14 ENCOUNTER — Other Ambulatory Visit: Payer: Self-pay

## 2021-10-14 ENCOUNTER — Inpatient Hospital Stay (HOSPITAL_BASED_OUTPATIENT_CLINIC_OR_DEPARTMENT_OTHER): Payer: Federal, State, Local not specified - PPO

## 2021-10-14 ENCOUNTER — Inpatient Hospital Stay: Payer: Federal, State, Local not specified - PPO

## 2021-10-14 VITALS — BP 135/69 | HR 94 | Temp 98.9°F | Resp 17 | Wt 199.8 lb

## 2021-10-14 DIAGNOSIS — Z171 Estrogen receptor negative status [ER-]: Secondary | ICD-10-CM | POA: Diagnosis not present

## 2021-10-14 DIAGNOSIS — Z5111 Encounter for antineoplastic chemotherapy: Secondary | ICD-10-CM | POA: Diagnosis not present

## 2021-10-14 DIAGNOSIS — C50312 Malignant neoplasm of lower-inner quadrant of left female breast: Secondary | ICD-10-CM

## 2021-10-14 DIAGNOSIS — Z5189 Encounter for other specified aftercare: Secondary | ICD-10-CM | POA: Diagnosis not present

## 2021-10-14 DIAGNOSIS — Z5112 Encounter for antineoplastic immunotherapy: Secondary | ICD-10-CM | POA: Diagnosis not present

## 2021-10-14 DIAGNOSIS — Z95828 Presence of other vascular implants and grafts: Secondary | ICD-10-CM

## 2021-10-14 DIAGNOSIS — Z79899 Other long term (current) drug therapy: Secondary | ICD-10-CM | POA: Diagnosis not present

## 2021-10-14 LAB — CMP (CANCER CENTER ONLY)
ALT: 29 U/L (ref 0–44)
AST: 24 U/L (ref 15–41)
Albumin: 4 g/dL (ref 3.5–5.0)
Alkaline Phosphatase: 92 U/L (ref 38–126)
Anion gap: 8 (ref 5–15)
BUN: 13 mg/dL (ref 6–20)
CO2: 26 mmol/L (ref 22–32)
Calcium: 9.3 mg/dL (ref 8.9–10.3)
Chloride: 103 mmol/L (ref 98–111)
Creatinine: 0.96 mg/dL (ref 0.44–1.00)
GFR, Estimated: >60 mL/min (ref >60)
Glucose, Bld: 121 mg/dL — ABNORMAL HIGH (ref 70–99)
Potassium: 3.3 mmol/L — ABNORMAL LOW (ref 3.5–5.1)
Sodium: 137 mmol/L (ref 135–145)
Total Bilirubin: 0.4 mg/dL (ref 0.3–1.2)
Total Protein: 7.9 g/dL (ref 6.5–8.1)

## 2021-10-14 LAB — CBC WITH DIFFERENTIAL (CANCER CENTER ONLY)
Abs Immature Granulocytes: 0.01 10*3/uL (ref 0.00–0.07)
Basophils Absolute: 0 10*3/uL (ref 0.0–0.1)
Basophils Relative: 1 %
Eosinophils Absolute: 0.1 10*3/uL (ref 0.0–0.5)
Eosinophils Relative: 2 %
HCT: 27.8 % — ABNORMAL LOW (ref 36.0–46.0)
Hemoglobin: 9.6 g/dL — ABNORMAL LOW (ref 12.0–15.0)
Immature Granulocytes: 0 %
Lymphocytes Relative: 56 %
Lymphs Abs: 2.3 10*3/uL (ref 0.7–4.0)
MCH: 29.1 pg (ref 26.0–34.0)
MCHC: 34.5 g/dL (ref 30.0–36.0)
MCV: 84.2 fL (ref 80.0–100.0)
Monocytes Absolute: 0.2 10*3/uL (ref 0.1–1.0)
Monocytes Relative: 6 %
Neutro Abs: 1.4 10*3/uL — ABNORMAL LOW (ref 1.7–7.7)
Neutrophils Relative %: 35 %
Platelet Count: 248 10*3/uL (ref 150–400)
RBC: 3.3 MIL/uL — ABNORMAL LOW (ref 3.87–5.11)
RDW: 13.2 % (ref 11.5–15.5)
WBC Count: 4 10*3/uL (ref 4.0–10.5)
nRBC: 0 % (ref 0.0–0.2)

## 2021-10-14 MED ORDER — SODIUM CHLORIDE 0.9 % IV SOLN
80.0000 mg/m2 | Freq: Once | INTRAVENOUS | Status: AC
  Administered 2021-10-14: 162 mg via INTRAVENOUS
  Filled 2021-10-14: qty 27

## 2021-10-14 MED ORDER — HEPARIN SOD (PORK) LOCK FLUSH 100 UNIT/ML IV SOLN
500.0000 [IU] | Freq: Once | INTRAVENOUS | Status: AC | PRN
  Administered 2021-10-14: 500 [IU]

## 2021-10-14 MED ORDER — SODIUM CHLORIDE 0.9% FLUSH
10.0000 mL | INTRAVENOUS | Status: DC | PRN
  Administered 2021-10-14: 10 mL

## 2021-10-14 MED ORDER — DIPHENHYDRAMINE HCL 50 MG/ML IJ SOLN
25.0000 mg | Freq: Once | INTRAMUSCULAR | Status: AC
  Administered 2021-10-14: 25 mg via INTRAVENOUS
  Filled 2021-10-14: qty 1

## 2021-10-14 MED ORDER — SODIUM CHLORIDE 0.9% FLUSH
10.0000 mL | Freq: Once | INTRAVENOUS | Status: AC
  Administered 2021-10-14: 10 mL

## 2021-10-14 MED ORDER — FAMOTIDINE IN NACL 20-0.9 MG/50ML-% IV SOLN
20.0000 mg | Freq: Once | INTRAVENOUS | Status: AC
  Administered 2021-10-14: 20 mg via INTRAVENOUS
  Filled 2021-10-14: qty 50

## 2021-10-14 MED ORDER — SODIUM CHLORIDE 0.9 % IV SOLN: Freq: Once | INTRAVENOUS | Status: AC

## 2021-10-14 NOTE — Progress Notes (Signed)
Per Dr Burr Medico, okay to treat today with ANC of 1.4 K/uL.  ?

## 2021-10-14 NOTE — Patient Instructions (Signed)
[facility]  Discharge Instructions: ?Thank you for choosing [facility] to provide your oncology and hematology care.  ? ?If you have a lab appointment with the [facility], please go directly to the [facility] and check in at the registration area. ?  ?Wear comfortable clothing and clothing appropriate for easy access to any Portacath or PICC line.  ? ?We strive to give you quality time with your provider. You may need to reschedule your appointment if you arrive late (15 or more minutes).  Arriving late affects you and other patients whose appointments are after yours.  Also, if you miss three or more appointments without notifying the office, you may be dismissed from the clinic at the provider?s discretion.    ?  ?For prescription refill requests, have your pharmacy contact our office and allow 72 hours for refills to be completed.   ? ?Today you received the following chemotherapy and/or immunotherapy agents: Taxol.    ?  ?To help prevent nausea and vomiting after your treatment, we encourage you to take your nausea medication as directed. ? ?BELOW ARE SYMPTOMS THAT SHOULD BE REPORTED IMMEDIATELY: ?*FEVER GREATER THAN 100.4 F (38 ?C) OR HIGHER ?*CHILLS OR SWEATING ?*NAUSEA AND VOMITING THAT IS NOT CONTROLLED WITH YOUR NAUSEA MEDICATION ?*UNUSUAL SHORTNESS OF BREATH ?*UNUSUAL BRUISING OR BLEEDING ?*URINARY PROBLEMS (pain or burning when urinating, or frequent urination) ?*BOWEL PROBLEMS (unusual diarrhea, constipation, pain near the anus) ?TENDERNESS IN MOUTH AND THROAT WITH OR WITHOUT PRESENCE OF ULCERS (sore throat, sores in mouth, or a toothache) ?UNUSUAL RASH, SWELLING OR PAIN  ?UNUSUAL VAGINAL DISCHARGE OR ITCHING  ? ?Items with * indicate a potential emergency and should be followed up as soon as possible or go to the Emergency Department if any problems should occur. ? ?Please show the CHEMOTHERAPY ALERT CARD or IMMUNOTHERAPY ALERT CARD at check-in to the  Emergency Department and triage nurse. ? ?Should you have questions after your visit or need to cancel or reschedule your appointment, please contact [facility]  Dept: [phone]  and follow the prompts.  Office hours are 8:00 a.m. to 4:30 p.m. Monday - Friday. Please note that voicemails left after 4:00 p.m. may not be returned until the following business day.  We are closed weekends and major holidays. You have access to a nurse at all times for urgent questions. Please call the main number to the clinic Dept: [phone] and follow the prompts. ? ? ?For any non-urgent questions, you may also contact your provider using MyChart. We now offer e-Visits for anyone 63 and older to request care online for non-urgent symptoms. For details visit [email or web address]. ?  ?Also download the MyChart app! Go to the app store, search "MyChart", open the app, select [facility], and log in with your MyChart username and password. ? ?Due to Covid, a mask is required upon entering the hospital/clinic. If you do not have a mask, one will be given to you upon arrival. For doctor visits, patients may have 1 support person aged 68 or older with them. For treatment visits, patients cannot have anyone with them due to current Covid guidelines and our immunocompromised population.  ? ?

## 2021-10-15 ENCOUNTER — Other Ambulatory Visit: Payer: Self-pay

## 2021-10-15 ENCOUNTER — Inpatient Hospital Stay: Payer: Federal, State, Local not specified - PPO

## 2021-10-15 VITALS — BP 144/82 | HR 96 | Temp 98.6°F | Resp 16

## 2021-10-15 DIAGNOSIS — Z171 Estrogen receptor negative status [ER-]: Secondary | ICD-10-CM | POA: Diagnosis not present

## 2021-10-15 DIAGNOSIS — Z5112 Encounter for antineoplastic immunotherapy: Secondary | ICD-10-CM | POA: Diagnosis not present

## 2021-10-15 DIAGNOSIS — C50312 Malignant neoplasm of lower-inner quadrant of left female breast: Secondary | ICD-10-CM | POA: Diagnosis not present

## 2021-10-15 DIAGNOSIS — Z5111 Encounter for antineoplastic chemotherapy: Secondary | ICD-10-CM | POA: Diagnosis not present

## 2021-10-15 DIAGNOSIS — Z79899 Other long term (current) drug therapy: Secondary | ICD-10-CM | POA: Diagnosis not present

## 2021-10-15 DIAGNOSIS — Z5189 Encounter for other specified aftercare: Secondary | ICD-10-CM | POA: Diagnosis not present

## 2021-10-15 MED ORDER — FILGRASTIM-AAFI 480 MCG/0.8ML IJ SOSY
480.0000 ug | PREFILLED_SYRINGE | Freq: Once | INTRAMUSCULAR | Status: AC
  Administered 2021-10-15: 480 ug via SUBCUTANEOUS
  Filled 2021-10-15: qty 0.8

## 2021-10-16 ENCOUNTER — Inpatient Hospital Stay: Payer: Federal, State, Local not specified - PPO

## 2021-10-16 VITALS — BP 133/75 | HR 99 | Temp 98.8°F | Resp 18

## 2021-10-16 DIAGNOSIS — Z5111 Encounter for antineoplastic chemotherapy: Secondary | ICD-10-CM | POA: Diagnosis not present

## 2021-10-16 DIAGNOSIS — Z170 Estrogen receptor positive status [ER+]: Secondary | ICD-10-CM

## 2021-10-16 DIAGNOSIS — Z5189 Encounter for other specified aftercare: Secondary | ICD-10-CM | POA: Diagnosis not present

## 2021-10-16 DIAGNOSIS — Z171 Estrogen receptor negative status [ER-]: Secondary | ICD-10-CM | POA: Diagnosis not present

## 2021-10-16 DIAGNOSIS — Z79899 Other long term (current) drug therapy: Secondary | ICD-10-CM | POA: Diagnosis not present

## 2021-10-16 DIAGNOSIS — C50312 Malignant neoplasm of lower-inner quadrant of left female breast: Secondary | ICD-10-CM | POA: Diagnosis not present

## 2021-10-16 DIAGNOSIS — Z5112 Encounter for antineoplastic immunotherapy: Secondary | ICD-10-CM | POA: Diagnosis not present

## 2021-10-16 MED ORDER — FILGRASTIM-AAFI 480 MCG/0.8ML IJ SOSY
480.0000 ug | PREFILLED_SYRINGE | Freq: Once | INTRAMUSCULAR | Status: AC
  Administered 2021-10-16: 480 ug via SUBCUTANEOUS
  Filled 2021-10-16: qty 0.8

## 2021-10-17 ENCOUNTER — Inpatient Hospital Stay: Payer: Federal, State, Local not specified - PPO

## 2021-10-17 ENCOUNTER — Other Ambulatory Visit: Payer: Self-pay

## 2021-10-17 VITALS — BP 143/85 | HR 108 | Temp 97.2°F | Resp 20

## 2021-10-17 DIAGNOSIS — Z5189 Encounter for other specified aftercare: Secondary | ICD-10-CM | POA: Diagnosis not present

## 2021-10-17 DIAGNOSIS — C50312 Malignant neoplasm of lower-inner quadrant of left female breast: Secondary | ICD-10-CM | POA: Diagnosis not present

## 2021-10-17 DIAGNOSIS — Z171 Estrogen receptor negative status [ER-]: Secondary | ICD-10-CM | POA: Diagnosis not present

## 2021-10-17 DIAGNOSIS — Z79899 Other long term (current) drug therapy: Secondary | ICD-10-CM | POA: Diagnosis not present

## 2021-10-17 DIAGNOSIS — Z5111 Encounter for antineoplastic chemotherapy: Secondary | ICD-10-CM | POA: Diagnosis not present

## 2021-10-17 DIAGNOSIS — Z5112 Encounter for antineoplastic immunotherapy: Secondary | ICD-10-CM | POA: Diagnosis not present

## 2021-10-17 MED ORDER — FILGRASTIM-AAFI 480 MCG/0.8ML IJ SOSY
480.0000 ug | PREFILLED_SYRINGE | Freq: Once | INTRAMUSCULAR | Status: AC
  Administered 2021-10-17: 480 ug via SUBCUTANEOUS
  Filled 2021-10-17: qty 0.8

## 2021-10-17 MED ORDER — FILGRASTIM-AAFI 480 MCG/0.8ML IJ SOSY: 480.0000 ug | PREFILLED_SYRINGE | Freq: Once | INTRAMUSCULAR | Status: DC

## 2021-10-20 NOTE — Progress Notes (Signed)
?[place]   ?Telephone:([phone] [phone]   ?Clinic Follow up Note  ? ?Patient Care Team: ?Billie Ruddy, MD as PCP - General (Family Medicine) ?Erroll Luna, MD as Consulting Physician (General Surgery) ?Truitt Merle, MD as Consulting Physician (Hematology) ?Gery Pray, MD as Consulting Physician (Radiation Oncology) ?10/21/2021 ? ?CHIEF COMPLAINT: Follow up functionally triple negative left breast cancer  ? ?SUMMARY OF ONCOLOGIC HISTORY: ?Oncology History Overview Note  ? Cancer Staging  ?Malignant neoplasm of lower-inner quadrant of left breast in female, estrogen receptor positive ([place]) ?Staging form: Breast, AJCC 8th Edition ?- Clinical stage from 08/07/2021: Stage IIIA (cT2, cN1, cM0, G3, ER+, PR-, HER2-) - Signed by Truitt Merle, MD on 08/18/2021 ? ?  ?Malignant neoplasm of lower-inner quadrant of left breast in female, estrogen receptor positive ([place])  ?08/07/2021 Cancer Staging  ? Staging form: Breast, AJCC 8th Edition ?- Clinical stage from 08/07/2021: Stage IIB (cT2, cN0, cM0, G3, ER-, PR-, HER2-) - Signed by Truitt Merle, MD on 09/24/2021 ?Stage prefix: Initial diagnosis ?Histologic grading system: 3 grade system ? ?  ?08/07/2021 Mammogram  ? CLINICAL DATA:  Palpable mass in the LEFT breast since August. ?  ?EXAM: ?DIGITAL DIAGNOSTIC BILATERAL MAMMOGRAM WITH TOMOSYNTHESIS AND CAD; ?ULTRASOUND LEFT BREAST LIMITED ? ?IMPRESSION: ?1. Suspicious mass in the 7:30 o'clock location of the LEFT breast ?for which biopsy is recommended. ?2. LEFT axillary adenopathy, with at least 4 abnormal lymph nodes. ?  ?08/07/2021 Initial Biopsy  ? Diagnosis ?1. Breast, left, needle core biopsy, 7:30, ribbon clip ?- INVASIVE DUCTAL CARCINOMA ?- SEE COMMENT ?2. Lymph node, needle/core biopsy, left axilla, coil clip ?- NO CARCINOMA IDENTIFIED ?Microscopic Comment ?1. based on the biopsy, the carcinoma appears Nottingham grade 3 of 3 and measures 1.1 cm in greatest linear extent. ? ?1. PROGNOSTIC  INDICATORS ?Results: ?The tumor cells are EQUIVOCAL for Her2 (2+). Her2 by FISH will be performed and results reported separately. ?Estrogen Receptor: 2%, POSITIVE, WEAK STAINING INTENSITY ?Progesterone Receptor: <1%, NEGATIVE ?Proliferation Marker Ki67: 40% ? ?1. FLUORESCENCE IN-SITU HYBRIDIZATION ?Results: ?GROUP 5: HER2 **NEGATIVE** ?  ?08/17/2021 Initial Diagnosis  ? Malignant neoplasm of lower-inner quadrant of left breast in female, estrogen receptor positive ([place]) ?  ?08/19/2021 Genetic Testing  ? [facility] CancerNext-Expanded Panel is Negative. Report date is 09/02/2021. ? ?The CancerNext-Expanded gene panel offered by [facility] and includes sequencing, rearrangement, and RNA analysis for the following 77 genes: AIP, ALK, APC, ATM, AXIN2, BAP1, BARD1, BLM, BMPR1A, BRCA1, BRCA2, BRIP1, CDC73, CDH1, CDK4, CDKN1B, CDKN2A, CHEK2, CTNNA1, DICER1, FANCC, FH, FLCN, GALNT12, KIF1B, LZTR1, MAX, MEN1, MET, MLH1, MSH2, MSH3, MSH6, MUTYH, NBN, NF1, NF2, NTHL1, PALB2, PHOX2B, PMS2, POT1, PRKAR1A, PTCH1, PTEN, RAD51C, RAD51D, RB1, RECQL, RET, SDHA, SDHAF2, SDHB, SDHC, SDHD, SMAD4, SMARCA4, SMARCB1, SMARCE1, STK11, SUFU, TMEM127, TP53, TSC1, TSC2, VHL and XRCC2 (sequencing and deletion/duplication); EGFR, EGLN1, HOXB13, KIT, MITF, PDGFRA, POLD1, and POLE (sequencing only); EPCAM and GREM1 (deletion/duplication only).  ?  ?08/26/2021 Imaging  ? EXAM: ?BILATERAL BREAST MRI WITH AND WITHOUT CONTRAST ? ?IMPRESSION: ?2.3 cm mass in the lower-inner quadrant of the left breast ?corresponding with the biopsy proven invasive ductal carcinoma. ?  ?08/31/2021 PET scan  ? IMPRESSION: ?1. Known mass medially in the left breast is markedly hypermetabolic consistent with breast cancer. ?2. No evidence of nodal metastases within the chest. No typical ?distant metastases. ?3. Suspected acute inflammatory process involving central mesentery and pelvis with ill-defined focal hypermetabolic activity anterior to the sacrum. There are pelvic  inflammatory changes and free  pelvic fluid. Recent labs demonstrate mild leukocytosis. Differential considerations include acute appendicitis, enteritis and small perforated foreign body. No evidence of drainable fluid collection, pneumoperitoneum or bowel obstruction. ?4. These results were called by telephone at the time of ?interpretation on 08/31/2021 at 5:12 pm to on-call oncology nurse, ?[name], who verbally acknowledged these results. Unless there is a good clinical explanation for these findings, evaluation in the ?emergency department is likely warranted, and patient may benefit ?from abdominopelvic CT with oral and intravenous contrast. ?  ?09/01/2021 Imaging  ? EXAM: ?CT ABDOMEN AND PELVIS WITH CONTRAST ? ?IMPRESSION: ?Multiple thick-walled loops of distal small bowel, suggesting ?infectious/inflammatory enteritis. Associated small volume pelvic ?ascites. No pneumatosis or free air. ?  ?No evidence of bowel obstruction.  Normal appendix. ?  ?2.1 cm lesion in the medial left breast, likely corresponding to the ?patient's known primary breast neoplasm. No findings suspicious for metastatic disease. ?  ?09/09/2021 -  Chemotherapy  ? Patient is on Treatment Plan : BREAST Pembrolizumab (200) D1 + Carboplatin (5) D1 + Paclitaxel (80) D1,8,15 q21d X 4 cycles / Pembrolizumab (200) D1 + AC D1 q21d x 4 cycles  ?   ? ? ?CURRENT THERAPY: Neoadjuvant weekly taxol x12 plus carbo every 3 weeks x4, to be [facility] by [facility] every 2 weeks x4 and pembrolizumab every 3 weeks to total 1 year ?  ?INTERVAL HISTORY: [name] returns for follow up and treatment as scheduled. Last seen by me 10/07/21, she continues weekly taxol (completed 6 weekly doses), carbo q3 weeks, and pembro q3 weeks.  She sleeps the whole next day after treatment, then fatigue improves.  She continues normal activities with some mild exertional dyspnea with going to the mailbox and climbing stairs.  Denies cough, chest pain.  She has some blood in her nose that  progressed to a full nosebleed last night after she blew hard, there were clots in it.  She also noticed pain in her right hand sporadically the last 2 days, denies overt numbness or tingling.  She is eating and drinking well, bowels moving.  Denies nausea/vomiting, mucositis, rash, fever, chills, or any other specific complaints.  She is due carbo/taxol and pembro today. ? ? ?MEDICAL HISTORY:  ?Past Medical History:  ?Diagnosis Date  ? Anal fissure   ? Anxiety   ? Breast cancer ([place]) 08/10/21  ? Date diagnosis was given  ? DVT (deep venous thrombosis) ([place])   ? Incomplete right bundle branch block (RBBB) determined by electrocardiography 09/01/2021  ? ? ?SURGICAL HISTORY: ?Past Surgical History:  ?Procedure Laterality Date  ? NO PAST SURGERIES    ? PORTACATH PLACEMENT N/A 09/08/2021  ? Procedure: INSERTION PORT-A-CATH;  Surgeon: Erroll Luna, MD;  Location: [facility];  Service: General;  Laterality: N/A;  ? ? ?I have reviewed the social history and family history with the patient and they are unchanged from previous note. ? ?ALLERGIES:  has No Known Allergies. ? ?MEDICATIONS:  ?Current Outpatient Medications  ?Medication Sig Dispense Refill  ? acetaminophen (TYLENOL) 500 MG tablet Take 500-1,000 mg by mouth every 6 (six) hours as needed (pain.).    ? ALAYCEN 1/35 tablet Take 1 tablet by mouth in the morning.  4  ? albuterol (VENTOLIN HFA) 108 (90 Base) MCG/ACT inhaler Inhale 2 puffs into the lungs every 4 (four) hours as needed for wheezing. 1 each 5  ? ALPRAZolam (XANAX) 0.5 MG tablet Take one tablet twice daily as needed for anxiety. 45 tablet 2  ? cyclobenzaprine (FLEXERIL) 10 MG  tablet Take 10 mg by mouth daily as needed for muscle spasms.    ? desonide (DESOWEN) 0.05 % cream Apply topically 2 (two) times daily. (Patient taking differently: Apply 1 application topically 2 (two) times daily as needed (skin irritation.).) 30 g 1  ? desonide (DESOWEN) 0.05 % lotion Apply 1 application topically daily.    ?  hydroquinone 4 % cream APPLY TO DARK SPOTS AT BEDTIME (Patient taking differently: Apply 1 application topically daily as needed (dark spots). APPLY TO DARK SPOTS AT BEDTIME) 28.35 g 0  ? ibuprofen (ADVIL) 800 MG tablet Take 800 m

## 2021-10-21 ENCOUNTER — Encounter: Payer: Self-pay | Admitting: Nurse Practitioner

## 2021-10-21 ENCOUNTER — Telehealth: Payer: Self-pay | Admitting: Hematology

## 2021-10-21 ENCOUNTER — Inpatient Hospital Stay: Payer: Federal, State, Local not specified - PPO

## 2021-10-21 ENCOUNTER — Inpatient Hospital Stay: Payer: Federal, State, Local not specified - PPO | Admitting: Nurse Practitioner

## 2021-10-21 ENCOUNTER — Other Ambulatory Visit: Payer: Self-pay

## 2021-10-21 VITALS — BP 129/79 | HR 102 | Temp 97.9°F | Resp 17 | Ht 62.0 in | Wt 201.4 lb

## 2021-10-21 VITALS — HR 100

## 2021-10-21 DIAGNOSIS — Z170 Estrogen receptor positive status [ER+]: Secondary | ICD-10-CM

## 2021-10-21 DIAGNOSIS — Z5189 Encounter for other specified aftercare: Secondary | ICD-10-CM | POA: Diagnosis not present

## 2021-10-21 DIAGNOSIS — C50312 Malignant neoplasm of lower-inner quadrant of left female breast: Secondary | ICD-10-CM

## 2021-10-21 DIAGNOSIS — Z5112 Encounter for antineoplastic immunotherapy: Secondary | ICD-10-CM | POA: Diagnosis not present

## 2021-10-21 DIAGNOSIS — Z5111 Encounter for antineoplastic chemotherapy: Secondary | ICD-10-CM | POA: Diagnosis not present

## 2021-10-21 DIAGNOSIS — Z95828 Presence of other vascular implants and grafts: Secondary | ICD-10-CM

## 2021-10-21 DIAGNOSIS — Z79899 Other long term (current) drug therapy: Secondary | ICD-10-CM | POA: Diagnosis not present

## 2021-10-21 DIAGNOSIS — Z171 Estrogen receptor negative status [ER-]: Secondary | ICD-10-CM | POA: Diagnosis not present

## 2021-10-21 LAB — CBC WITH DIFFERENTIAL (CANCER CENTER ONLY)
Abs Immature Granulocytes: 0.35 10*3/uL — ABNORMAL HIGH (ref 0.00–0.07)
Basophils Absolute: 0 10*3/uL (ref 0.0–0.1)
Basophils Relative: 1 %
Eosinophils Absolute: 0 10*3/uL (ref 0.0–0.5)
Eosinophils Relative: 0 %
HCT: 27.6 % — ABNORMAL LOW (ref 36.0–46.0)
Hemoglobin: 9.4 g/dL — ABNORMAL LOW (ref 12.0–15.0)
Immature Granulocytes: 4 %
Lymphocytes Relative: 42 %
Lymphs Abs: 3.3 10*3/uL (ref 0.7–4.0)
MCH: 29.1 pg (ref 26.0–34.0)
MCHC: 34.1 g/dL (ref 30.0–36.0)
MCV: 85.4 fL (ref 80.0–100.0)
Monocytes Absolute: 0.7 10*3/uL (ref 0.1–1.0)
Monocytes Relative: 9 %
Neutro Abs: 3.4 10*3/uL (ref 1.7–7.7)
Neutrophils Relative %: 44 %
Platelet Count: 170 10*3/uL (ref 150–400)
RBC: 3.23 MIL/uL — ABNORMAL LOW (ref 3.87–5.11)
RDW: 14.6 % (ref 11.5–15.5)
WBC Count: 7.9 10*3/uL (ref 4.0–10.5)
nRBC: 0.4 % — ABNORMAL HIGH (ref 0.0–0.2)

## 2021-10-21 LAB — CMP (CANCER CENTER ONLY)
ALT: 37 U/L (ref 0–44)
AST: 25 U/L (ref 15–41)
Albumin: 4.1 g/dL (ref 3.5–5.0)
Alkaline Phosphatase: 114 U/L (ref 38–126)
Anion gap: 8 (ref 5–15)
BUN: 12 mg/dL (ref 6–20)
CO2: 28 mmol/L (ref 22–32)
Calcium: 9.4 mg/dL (ref 8.9–10.3)
Chloride: 104 mmol/L (ref 98–111)
Creatinine: 0.88 mg/dL (ref 0.44–1.00)
GFR, Estimated: >60 mL/min (ref >60)
Glucose, Bld: 128 mg/dL — ABNORMAL HIGH (ref 70–99)
Potassium: 3.5 mmol/L (ref 3.5–5.1)
Sodium: 140 mmol/L (ref 135–145)
Total Bilirubin: 0.3 mg/dL (ref 0.3–1.2)
Total Protein: 7.7 g/dL (ref 6.5–8.1)

## 2021-10-21 LAB — TSH: TSH: 2.044 u[IU]/mL (ref 0.308–3.960)

## 2021-10-21 LAB — T4, FREE: Free T4: 0.98 ng/dL (ref 0.61–1.12)

## 2021-10-21 MED ORDER — SODIUM CHLORIDE 0.9 % IV SOLN
750.0000 mg | Freq: Once | INTRAVENOUS | Status: AC
  Administered 2021-10-21: 750 mg via INTRAVENOUS
  Filled 2021-10-21: qty 75

## 2021-10-21 MED ORDER — SODIUM CHLORIDE 0.9 % IV SOLN: Freq: Once | INTRAVENOUS | Status: AC

## 2021-10-21 MED ORDER — HEPARIN SOD (PORK) LOCK FLUSH 100 UNIT/ML IV SOLN
500.0000 [IU] | Freq: Once | INTRAVENOUS | Status: AC | PRN
  Administered 2021-10-21: 500 [IU]

## 2021-10-21 MED ORDER — PALONOSETRON HCL INJECTION 0.25 MG/5ML
0.2500 mg | Freq: Once | INTRAVENOUS | Status: AC
  Administered 2021-10-21: 0.25 mg via INTRAVENOUS
  Filled 2021-10-21: qty 5

## 2021-10-21 MED ORDER — SODIUM CHLORIDE 0.9 % IV SOLN
200.0000 mg | Freq: Once | INTRAVENOUS | Status: AC
  Administered 2021-10-21: 200 mg via INTRAVENOUS
  Filled 2021-10-21: qty 200

## 2021-10-21 MED ORDER — DIPHENHYDRAMINE HCL 50 MG/ML IJ SOLN
25.0000 mg | Freq: Once | INTRAMUSCULAR | Status: AC
  Administered 2021-10-21: 25 mg via INTRAVENOUS
  Filled 2021-10-21: qty 1

## 2021-10-21 MED ORDER — SODIUM CHLORIDE 0.9 % IV SOLN
80.0000 mg/m2 | Freq: Once | INTRAVENOUS | Status: AC
  Administered 2021-10-21: 162 mg via INTRAVENOUS
  Filled 2021-10-21: qty 27

## 2021-10-21 MED ORDER — SODIUM CHLORIDE 0.9% FLUSH
10.0000 mL | INTRAVENOUS | Status: DC | PRN
  Administered 2021-10-21: 10 mL

## 2021-10-21 MED ORDER — SODIUM CHLORIDE 0.9 % IV SOLN
150.0000 mg | Freq: Once | INTRAVENOUS | Status: AC
  Administered 2021-10-21: 150 mg via INTRAVENOUS
  Filled 2021-10-21: qty 150

## 2021-10-21 MED ORDER — SODIUM CHLORIDE 0.9% FLUSH
10.0000 mL | Freq: Once | INTRAVENOUS | Status: AC
  Administered 2021-10-21: 10 mL

## 2021-10-21 MED ORDER — FAMOTIDINE IN NACL 20-0.9 MG/50ML-% IV SOLN
20.0000 mg | Freq: Once | INTRAVENOUS | Status: AC
  Administered 2021-10-21: 20 mg via INTRAVENOUS
  Filled 2021-10-21: qty 50

## 2021-10-21 NOTE — Patient Instructions (Signed)
[facility] MEDICAL [facility]COLOGY  Discharge Instructions: Thank you for choosing [facility] to provide your oncology and hematology care.   If you have a lab appointment with the Cancer Center, please go directly to the Cancer Center and check in at the registration area.   Wear comfortable clothing and clothing appropriate for easy access to any Portacath or PICC line.   We strive to give you quality time with your provider. You may need to reschedule your appointment if you arrive late (15 or more minutes).  Arriving late affects you and other patients whose appointments are after yours.  Also, if you miss three or more appointments without notifying the office, you may be dismissed from the clinic at the provider's discretion.      For prescription refill requests, have your pharmacy contact our office and allow 72 hours for refills to be completed.    Today you received the following chemotherapy and/or immunotherapy agents: Keytruda, Taxol, Carboplatin     To help prevent nausea and vomiting after your treatment, we encourage you to take your nausea medication as directed.  BELOW ARE SYMPTOMS THAT SHOULD BE REPORTED IMMEDIATELY: . *FEVER GREATER THAN 100.4 F (38 C) OR HIGHER . *CHILLS OR SWEATING . *NAUSEA AND VOMITING THAT IS NOT CONTROLLED WITH YOUR NAUSEA MEDICATION . *UNUSUAL SHORTNESS OF BREATH . *UNUSUAL BRUISING OR BLEEDING . *URINARY PROBLEMS (pain or burning when urinating, or frequent urination) . *BOWEL PROBLEMS (unusual diarrhea, constipation, pain near the anus) . TENDERNESS IN MOUTH AND THROAT WITH OR WITHOUT PRESENCE OF ULCERS (sore throat, sores in mouth, or a toothache) . UNUSUAL RASH, SWELLING OR PAIN  . UNUSUAL VAGINAL DISCHARGE OR ITCHING   Items with * indicate a potential emergency and should be followed up as soon as possible or go to the Emergency Department if any problems should occur.  Please show the CHEMOTHERAPY ALERT CARD or  IMMUNOTHERAPY ALERT CARD at check-in to the Emergency Department and triage nurse.  Should you have questions after your visit or need to cancel or reschedule your appointment, please contact [facility]  Dept: [phone]  and follow the prompts.  Office hours are 8:00 a.m. to 4:30 p.m. Monday - Friday. Please note that voicemails left after 4:00 p.m. may not be returned until the following business day.  We are closed weekends and major holidays. You have access to a nurse at all times for urgent questions. Please call the main number to the clinic Dept: [phone] and follow the prompts.   For any non-urgent questions, you may also contact your provider using MyChart. We now offer e-Visits for anyone 18 and older to request care online for non-urgent symptoms. For details visit [email or web address].   Also download the MyChart app! Go to the app store, search "MyChart", open the app, select [facility], and log in with your MyChart username and password.  Due to Covid, a mask is required upon entering the hospital/clinic. If you do not have a mask, one will be given to you upon arrival. For doctor visits, patients may have 1 support person aged 18 or older with them. For treatment visits, patients cannot have anyone with them due to current Covid guidelines and our immunocompromised population.   

## 2021-10-21 NOTE — Telephone Encounter (Signed)
Left message with follow-up appointment per 3/22 los. ?

## 2021-10-22 ENCOUNTER — Encounter: Payer: Self-pay | Admitting: *Deleted

## 2021-10-23 ENCOUNTER — Other Ambulatory Visit: Payer: Self-pay

## 2021-10-23 ENCOUNTER — Inpatient Hospital Stay: Payer: Federal, State, Local not specified - PPO | Admitting: Licensed Clinical Social Worker

## 2021-10-23 DIAGNOSIS — Z170 Estrogen receptor positive status [ER+]: Secondary | ICD-10-CM

## 2021-10-23 NOTE — Progress Notes (Signed)
[facility] Advance Directives ?Clinical Social Work ? ?Patient presented to [facility]  to review and complete healthcare advance directives.  Clinical Social Worker met with patient and spouse.  The patient designated Linna Darner as their primary healthcare agent and Carmita Debroux as their secondary agent.  Patient also completed healthcare living will.   ? ?Documents were notarized and copies made for patient/family. Clinical Social Worker will send documents to medical records to be scanned into patient's chart. ? ?Clinical Social Worker encouraged patient/family to contact with any additional questions or concerns. ? ? ?Vola Beneke E Jozy Mcphearson, LCSW ?Clinical Social Worker ?[facility]      ? ?

## 2021-10-26 ENCOUNTER — Encounter: Payer: Self-pay | Admitting: Hematology

## 2021-10-28 ENCOUNTER — Inpatient Hospital Stay: Payer: Federal, State, Local not specified - PPO

## 2021-10-28 ENCOUNTER — Encounter: Payer: Self-pay | Admitting: Nurse Practitioner

## 2021-10-28 ENCOUNTER — Other Ambulatory Visit: Payer: Self-pay

## 2021-10-28 VITALS — BP 155/80 | HR 100 | Temp 98.2°F | Resp 18 | Wt 200.0 lb

## 2021-10-28 DIAGNOSIS — Z170 Estrogen receptor positive status [ER+]: Secondary | ICD-10-CM

## 2021-10-28 DIAGNOSIS — Z95828 Presence of other vascular implants and grafts: Secondary | ICD-10-CM

## 2021-10-28 DIAGNOSIS — Z5189 Encounter for other specified aftercare: Secondary | ICD-10-CM | POA: Diagnosis not present

## 2021-10-28 DIAGNOSIS — Z5112 Encounter for antineoplastic immunotherapy: Secondary | ICD-10-CM | POA: Diagnosis not present

## 2021-10-28 DIAGNOSIS — Z79899 Other long term (current) drug therapy: Secondary | ICD-10-CM | POA: Diagnosis not present

## 2021-10-28 DIAGNOSIS — Z5111 Encounter for antineoplastic chemotherapy: Secondary | ICD-10-CM | POA: Diagnosis not present

## 2021-10-28 DIAGNOSIS — Z171 Estrogen receptor negative status [ER-]: Secondary | ICD-10-CM | POA: Diagnosis not present

## 2021-10-28 DIAGNOSIS — C50312 Malignant neoplasm of lower-inner quadrant of left female breast: Secondary | ICD-10-CM | POA: Diagnosis not present

## 2021-10-28 LAB — CMP (CANCER CENTER ONLY)
ALT: 25 U/L (ref 0–44)
AST: 21 U/L (ref 15–41)
Albumin: 4 g/dL (ref 3.5–5.0)
Alkaline Phosphatase: 92 U/L (ref 38–126)
Anion gap: 9 (ref 5–15)
BUN: 14 mg/dL (ref 6–20)
CO2: 28 mmol/L (ref 22–32)
Calcium: 9.1 mg/dL (ref 8.9–10.3)
Chloride: 101 mmol/L (ref 98–111)
Creatinine: 0.86 mg/dL (ref 0.44–1.00)
GFR, Estimated: >60 mL/min
Glucose, Bld: 123 mg/dL — ABNORMAL HIGH (ref 70–99)
Potassium: 3.2 mmol/L — ABNORMAL LOW (ref 3.5–5.1)
Sodium: 138 mmol/L (ref 135–145)
Total Bilirubin: 0.6 mg/dL (ref 0.3–1.2)
Total Protein: 7.7 g/dL (ref 6.5–8.1)

## 2021-10-28 LAB — CBC WITH DIFFERENTIAL (CANCER CENTER ONLY)
Abs Immature Granulocytes: 0.03 K/uL (ref 0.00–0.07)
Basophils Absolute: 0 K/uL (ref 0.0–0.1)
Basophils Relative: 1 %
Eosinophils Absolute: 0.1 K/uL (ref 0.0–0.5)
Eosinophils Relative: 1 %
HCT: 24.9 % — ABNORMAL LOW (ref 36.0–46.0)
Hemoglobin: 8.7 g/dL — ABNORMAL LOW (ref 12.0–15.0)
Immature Granulocytes: 1 %
Lymphocytes Relative: 37 %
Lymphs Abs: 2.2 K/uL (ref 0.7–4.0)
MCH: 29.4 pg (ref 26.0–34.0)
MCHC: 34.9 g/dL (ref 30.0–36.0)
MCV: 84.1 fL (ref 80.0–100.0)
Monocytes Absolute: 0.2 K/uL (ref 0.1–1.0)
Monocytes Relative: 4 %
Neutro Abs: 3.4 K/uL (ref 1.7–7.7)
Neutrophils Relative %: 56 %
Platelet Count: 315 K/uL (ref 150–400)
RBC: 2.96 MIL/uL — ABNORMAL LOW (ref 3.87–5.11)
RDW: 14.6 % (ref 11.5–15.5)
WBC Count: 5.9 K/uL (ref 4.0–10.5)
nRBC: 0 % (ref 0.0–0.2)

## 2021-10-28 MED ORDER — SODIUM CHLORIDE 0.9% FLUSH
10.0000 mL | Freq: Once | INTRAVENOUS | Status: AC
  Administered 2021-10-28: 10 mL

## 2021-10-28 MED ORDER — SODIUM CHLORIDE 0.9% FLUSH
10.0000 mL | INTRAVENOUS | Status: DC | PRN
  Administered 2021-10-28: 10 mL

## 2021-10-28 MED ORDER — DIPHENHYDRAMINE HCL 50 MG/ML IJ SOLN
25.0000 mg | Freq: Once | INTRAMUSCULAR | Status: AC
  Administered 2021-10-28: 25 mg via INTRAVENOUS
  Filled 2021-10-28: qty 1

## 2021-10-28 MED ORDER — SODIUM CHLORIDE 0.9 % IV SOLN: Freq: Once | INTRAVENOUS | Status: AC

## 2021-10-28 MED ORDER — SODIUM CHLORIDE 0.9 % IV SOLN
80.0000 mg/m2 | Freq: Once | INTRAVENOUS | Status: AC
  Administered 2021-10-28: 162 mg via INTRAVENOUS
  Filled 2021-10-28: qty 27

## 2021-10-28 MED ORDER — FAMOTIDINE IN NACL 20-0.9 MG/50ML-% IV SOLN
20.0000 mg | Freq: Once | INTRAVENOUS | Status: AC
  Administered 2021-10-28: 20 mg via INTRAVENOUS
  Filled 2021-10-28: qty 50

## 2021-10-28 MED ORDER — HEPARIN SOD (PORK) LOCK FLUSH 100 UNIT/ML IV SOLN
500.0000 [IU] | Freq: Once | INTRAVENOUS | Status: AC | PRN
  Administered 2021-10-28: 500 [IU]

## 2021-10-28 NOTE — Patient Instructions (Signed)
[facility]  Discharge Instructions: ?Thank you for choosing [facility] to provide your oncology and hematology care.  ? ?If you have a lab appointment with the [facility], please go directly to the [facility] and check in at the registration area. ?  ?Wear comfortable clothing and clothing appropriate for easy access to any Portacath or PICC line.  ? ?We strive to give you quality time with your provider. You may need to reschedule your appointment if you arrive late (15 or more minutes).  Arriving late affects you and other patients whose appointments are after yours.  Also, if you miss three or more appointments without notifying the office, you may be dismissed from the clinic at the provider?s discretion.    ?  ?For prescription refill requests, have your pharmacy contact our office and allow 72 hours for refills to be completed.   ? ?Today you received the following chemotherapy and/or immunotherapy agents: Taxol.    ?  ?To help prevent nausea and vomiting after your treatment, we encourage you to take your nausea medication as directed. ? ?BELOW ARE SYMPTOMS THAT SHOULD BE REPORTED IMMEDIATELY: ?*FEVER GREATER THAN 100.4 F (38 ?C) OR HIGHER ?*CHILLS OR SWEATING ?*NAUSEA AND VOMITING THAT IS NOT CONTROLLED WITH YOUR NAUSEA MEDICATION ?*UNUSUAL SHORTNESS OF BREATH ?*UNUSUAL BRUISING OR BLEEDING ?*URINARY PROBLEMS (pain or burning when urinating, or frequent urination) ?*BOWEL PROBLEMS (unusual diarrhea, constipation, pain near the anus) ?TENDERNESS IN MOUTH AND THROAT WITH OR WITHOUT PRESENCE OF ULCERS (sore throat, sores in mouth, or a toothache) ?UNUSUAL RASH, SWELLING OR PAIN  ?UNUSUAL VAGINAL DISCHARGE OR ITCHING  ? ?Items with * indicate a potential emergency and should be followed up as soon as possible or go to the Emergency Department if any problems should occur. ? ?Please show the CHEMOTHERAPY ALERT CARD or IMMUNOTHERAPY ALERT CARD at check-in to the  Emergency Department and triage nurse. ? ?Should you have questions after your visit or need to cancel or reschedule your appointment, please contact [facility]  Dept: [phone]  and follow the prompts.  Office hours are 8:00 a.m. to 4:30 p.m. Monday - Friday. Please note that voicemails left after 4:00 p.m. may not be returned until the following business day.  We are closed weekends and major holidays. You have access to a nurse at all times for urgent questions. Please call the main number to the clinic Dept: [phone] and follow the prompts. ? ? ?For any non-urgent questions, you may also contact your provider using MyChart. We now offer e-Visits for anyone 26 and older to request care online for non-urgent symptoms. For details visit [email or web address]. ?  ?Also download the MyChart app! Go to the app store, search "MyChart", open the app, select [facility], and log in with your MyChart username and password. ? ?Due to Covid, a mask is required upon entering the hospital/clinic. If you do not have a mask, one will be given to you upon arrival. For doctor visits, patients may have 1 support Kindell Strada aged 75 or older with them. For treatment visits, patients cannot have anyone with them due to current Covid guidelines and our immunocompromised population.  ? ?

## 2021-11-02 DIAGNOSIS — L2089 Other atopic dermatitis: Secondary | ICD-10-CM | POA: Diagnosis not present

## 2021-11-03 ENCOUNTER — Other Ambulatory Visit: Payer: Self-pay | Admitting: Nurse Practitioner

## 2021-11-03 DIAGNOSIS — C50312 Malignant neoplasm of lower-inner quadrant of left female breast: Secondary | ICD-10-CM

## 2021-11-03 NOTE — Progress Notes (Signed)
?[place]   ?Telephone:([phone] [phone]   ?Clinic Follow up Note  ? ?Patient Care Team: ?Billie Ruddy, MD as PCP - General (Family Medicine) ?Erroll Luna, MD as Consulting Physician (General Surgery) ?Truitt Merle, MD as Consulting Physician (Hematology) ?Gery Pray, MD as Consulting Physician (Radiation Oncology) ?11/04/2021 ? ?CHIEF COMPLAINT: Follow-up functionally triple negative left breast cancer ? ?SUMMARY OF ONCOLOGIC HISTORY: ?Oncology History Overview Note  ? Cancer Staging  ?Malignant neoplasm of lower-inner quadrant of left breast in female, estrogen receptor positive ([place]) ?Staging form: Breast, AJCC 8th Edition ?- Clinical stage from 08/07/2021: Stage IIIA (cT2, cN1, cM0, G3, ER+, PR-, HER2-) - Signed by Truitt Merle, MD on 08/18/2021 ? ?  ?Malignant neoplasm of lower-inner quadrant of left breast in female, estrogen receptor positive ([name])  ?08/07/2021 Cancer Staging  ? Staging form: Breast, AJCC 8th Edition ?- Clinical stage from 08/07/2021: Stage IIB (cT2, cN0, cM0, G3, ER-, PR-, HER2-) - Signed by Truitt Merle, MD on 09/24/2021 ?Stage prefix: Initial diagnosis ?Histologic grading system: 3 grade system ? ?  ?08/07/2021 Mammogram  ? CLINICAL DATA:  Palpable mass in the LEFT breast since August. ?  ?EXAM: ?DIGITAL DIAGNOSTIC BILATERAL MAMMOGRAM WITH TOMOSYNTHESIS AND CAD; ?ULTRASOUND LEFT BREAST LIMITED ? ?IMPRESSION: ?1. Suspicious mass in the 7:30 o'clock location of the LEFT breast ?for which biopsy is recommended. ?2. LEFT axillary adenopathy, with at least 4 abnormal lymph nodes. ?  ?08/07/2021 Initial Biopsy  ? Diagnosis ?1. Breast, left, needle core biopsy, 7:30, ribbon clip ?- INVASIVE DUCTAL CARCINOMA ?- SEE COMMENT ?2. Lymph node, needle/core biopsy, left axilla, coil clip ?- NO CARCINOMA IDENTIFIED ?Microscopic Comment ?1. based on the biopsy, the carcinoma appears Nottingham grade 3 of 3 and measures 1.1 cm in greatest linear extent. ? ?1. PROGNOSTIC  INDICATORS ?Results: ?The tumor cells are EQUIVOCAL for Her2 (2+). Her2 by FISH will be performed and results reported separately. ?Estrogen Receptor: 2%, POSITIVE, WEAK STAINING INTENSITY ?Progesterone Receptor: <1%, NEGATIVE ?Proliferation Marker Ki67: 40% ? ?1. FLUORESCENCE IN-SITU HYBRIDIZATION ?Results: ?GROUP 5: HER2 **NEGATIVE** ?  ?08/17/2021 Initial Diagnosis  ? Malignant neoplasm of lower-inner quadrant of left breast in female, estrogen receptor positive ([name]) ?  ?08/19/2021 Genetic Testing  ? [facility] CancerNext-Expanded Panel is Negative. Report date is 09/02/2021. ? ?The CancerNext-Expanded gene panel offered by [facility] and includes sequencing, rearrangement, and RNA analysis for the following 77 genes: AIP, ALK, APC, ATM, AXIN2, BAP1, BARD1, BLM, BMPR1A, BRCA1, BRCA2, BRIP1, CDC73, CDH1, CDK4, CDKN1B, CDKN2A, CHEK2, CTNNA1, DICER1, FANCC, FH, FLCN, GALNT12, KIF1B, LZTR1, MAX, MEN1, MET, MLH1, MSH2, MSH3, MSH6, MUTYH, NBN, NF1, NF2, NTHL1, PALB2, PHOX2B, PMS2, POT1, PRKAR1A, PTCH1, PTEN, RAD51C, RAD51D, RB1, RECQL, RET, SDHA, SDHAF2, SDHB, SDHC, SDHD, SMAD4, SMARCA4, SMARCB1, SMARCE1, STK11, SUFU, TMEM127, TP53, TSC1, TSC2, VHL and XRCC2 (sequencing and deletion/duplication); EGFR, EGLN1, HOXB13, KIT, MITF, PDGFRA, POLD1, and POLE (sequencing only); EPCAM and GREM1 (deletion/duplication only).  ?  ?08/26/2021 Imaging  ? EXAM: ?BILATERAL BREAST MRI WITH AND WITHOUT CONTRAST ? ?IMPRESSION: ?2.3 cm mass in the lower-inner quadrant of the left breast ?corresponding with the biopsy proven invasive ductal carcinoma. ?  ?08/31/2021 PET scan  ? IMPRESSION: ?1. Known mass medially in the left breast is markedly hypermetabolic consistent with breast cancer. ?2. No evidence of nodal metastases within the chest. No typical ?distant metastases. ?3. Suspected acute inflammatory process involving central mesentery and pelvis with ill-defined focal hypermetabolic activity anterior to the sacrum. There are pelvic  inflammatory changes and free pelvic fluid.  Recent labs demonstrate mild leukocytosis. Differential considerations include acute appendicitis, enteritis and small perforated foreign body. No evidence of drainable fluid collection, pneumoperitoneum or bowel obstruction. ?4. These results were called by telephone at the time of ?interpretation on 08/31/2021 at 5:12 pm to on-call oncology nurse, ?[name], who verbally acknowledged these results. Unless there is a good clinical explanation for these findings, evaluation in the ?emergency department is likely warranted, and patient may benefit ?from abdominopelvic CT with oral and intravenous contrast. ?  ?09/01/2021 Imaging  ? EXAM: ?CT ABDOMEN AND PELVIS WITH CONTRAST ? ?IMPRESSION: ?Multiple thick-walled loops of distal small bowel, suggesting ?infectious/inflammatory enteritis. Associated small volume pelvic ?ascites. No pneumatosis or free air. ?  ?No evidence of bowel obstruction.  Normal appendix. ?  ?2.1 cm lesion in the medial left breast, likely corresponding to the ?patient's known primary breast neoplasm. No findings suspicious for metastatic disease. ?  ?09/09/2021 -  Chemotherapy  ? Patient is on Treatment Plan : BREAST Pembrolizumab (200) D1 + Carboplatin (5) D1 + Paclitaxel (80) D1,8,15 q21d X 4 cycles / Pembrolizumab (200) D1 + AC D1 q21d x 4 cycles  ?   ? ? ?CURRENT THERAPY: Neoadjuvant weekly taxol x12 plus carbo every 3 weeks x4, to be followed by [facility] every 2 weeks x4 and pembrolizumab every 3 weeks to total 1 year ? ?INTERVAL HISTORY: Cheyenne [name] returns for follow-up and treatment as scheduled, last seen by me 10/21/2021.  She continues weekly Taxol plus carbo and pembrolizumab every 3 weeks. S/p cycle 3-day 8 (eighth dose) Taxol only 10/28/2021.  She feels tired, and occasionally dizzy if bending and changing positions.  She tried to do light yard work and felt dizzy, did not pass out.  She is eating and drinking well, bowels moving with occasional  constipation.  Denies nausea/vomiting.  Denies neuropathy.  She had some pain in the right hand a few weeks ago that resolved when she wore a support glove.  She had dry and burning skin on her face, saw dermatologist who prescribed low-dose steroid lotion, she uses periodically.  Her wife would like to donate blood for her, they are the same type O+. ? ?All other systems were reviewed with the patient and are negative. ? ?MEDICAL HISTORY:  ?Past Medical History:  ?Diagnosis Date  ? Anal fissure   ? Anxiety   ? Breast cancer ([place]) 08/10/21  ? Date diagnosis was given  ? DVT (deep venous thrombosis) ([place])   ? Incomplete right bundle branch block (RBBB) determined by electrocardiography 09/01/2021  ? ? ?SURGICAL HISTORY: ?Past Surgical History:  ?Procedure Laterality Date  ? NO PAST SURGERIES    ? PORTACATH PLACEMENT N/A 09/08/2021  ? Procedure: INSERTION PORT-A-CATH;  Surgeon: Erroll Luna, MD;  Location: [facility];  Service: General;  Laterality: N/A;  ? ? ?I have reviewed the social history and family history with the patient and they are unchanged from previous note. ? ?ALLERGIES:  has No Known Allergies. ? ?MEDICATIONS:  ?Current Outpatient Medications  ?Medication Sig Dispense Refill  ? potassium chloride SA (KLOR-CON M) 20 MEQ tablet Take 1 tablet (20 mEq total) by mouth 2 (two) times daily. 60 tablet 0  ? acetaminophen (TYLENOL) 500 MG tablet Take 500-1,000 mg by mouth every 6 (six) hours as needed (pain.).    ? ALAYCEN 1/35 tablet Take 1 tablet by mouth in the morning.  4  ? albuterol (VENTOLIN HFA) 108 (90 Base) MCG/ACT inhaler Inhale 2 puffs into the lungs every 4 (four) hours  as needed for wheezing. 1 each 5  ? ALPRAZolam (XANAX) 0.5 MG tablet Take one tablet twice daily as needed for anxiety. 45 tablet 2  ? cyclobenzaprine (FLEXERIL) 10 MG tablet Take 10 mg by mouth daily as needed for muscle spasms.    ? desonide (DESOWEN) 0.05 % cream Apply topically 2 (two) times daily. (Patient taking differently: Apply 1  application topically 2 (two) times daily as needed (skin irritation.).) 30 g 1  ? desonide (DESOWEN) 0.05 % lotion Apply 1 application topically daily.    ? hydroquinone 4 % cream APPLY TO DARK SPOTS AT BEDTIME (Patient takin

## 2021-11-04 ENCOUNTER — Inpatient Hospital Stay: Payer: Federal, State, Local not specified - PPO | Admitting: Nurse Practitioner

## 2021-11-04 ENCOUNTER — Encounter: Payer: Self-pay | Admitting: Nurse Practitioner

## 2021-11-04 ENCOUNTER — Inpatient Hospital Stay: Payer: Federal, State, Local not specified - PPO | Attending: Hematology

## 2021-11-04 ENCOUNTER — Inpatient Hospital Stay: Payer: Federal, State, Local not specified - PPO

## 2021-11-04 ENCOUNTER — Other Ambulatory Visit: Payer: Self-pay

## 2021-11-04 VITALS — BP 133/69 | HR 96 | Temp 98.4°F | Resp 17 | Wt 201.4 lb

## 2021-11-04 DIAGNOSIS — E876 Hypokalemia: Secondary | ICD-10-CM | POA: Diagnosis not present

## 2021-11-04 DIAGNOSIS — Z170 Estrogen receptor positive status [ER+]: Secondary | ICD-10-CM

## 2021-11-04 DIAGNOSIS — Z5111 Encounter for antineoplastic chemotherapy: Secondary | ICD-10-CM | POA: Insufficient documentation

## 2021-11-04 DIAGNOSIS — Z5112 Encounter for antineoplastic immunotherapy: Secondary | ICD-10-CM | POA: Diagnosis not present

## 2021-11-04 DIAGNOSIS — Z95828 Presence of other vascular implants and grafts: Secondary | ICD-10-CM

## 2021-11-04 DIAGNOSIS — C50312 Malignant neoplasm of lower-inner quadrant of left female breast: Secondary | ICD-10-CM

## 2021-11-04 DIAGNOSIS — Z5189 Encounter for other specified aftercare: Secondary | ICD-10-CM | POA: Insufficient documentation

## 2021-11-04 DIAGNOSIS — D649 Anemia, unspecified: Secondary | ICD-10-CM | POA: Insufficient documentation

## 2021-11-04 DIAGNOSIS — Z79899 Other long term (current) drug therapy: Secondary | ICD-10-CM | POA: Diagnosis not present

## 2021-11-04 LAB — CBC WITH DIFFERENTIAL (CANCER CENTER ONLY)
Abs Immature Granulocytes: 0.02 10*3/uL (ref 0.00–0.07)
Basophils Absolute: 0 10*3/uL (ref 0.0–0.1)
Basophils Relative: 0 %
Eosinophils Absolute: 0 10*3/uL (ref 0.0–0.5)
Eosinophils Relative: 1 %
HCT: 22 % — ABNORMAL LOW (ref 36.0–46.0)
Hemoglobin: 7.5 g/dL — ABNORMAL LOW (ref 12.0–15.0)
Immature Granulocytes: 0 %
Lymphocytes Relative: 53 %
Lymphs Abs: 2.8 10*3/uL (ref 0.7–4.0)
MCH: 29.6 pg (ref 26.0–34.0)
MCHC: 34.1 g/dL (ref 30.0–36.0)
MCV: 87 fL (ref 80.0–100.0)
Monocytes Absolute: 0.5 10*3/uL (ref 0.1–1.0)
Monocytes Relative: 8 %
Neutro Abs: 2.1 10*3/uL (ref 1.7–7.7)
Neutrophils Relative %: 38 %
Platelet Count: 199 10*3/uL (ref 150–400)
RBC: 2.53 MIL/uL — ABNORMAL LOW (ref 3.87–5.11)
RDW: 15.7 % — ABNORMAL HIGH (ref 11.5–15.5)
WBC Count: 5.3 10*3/uL (ref 4.0–10.5)
nRBC: 0 % (ref 0.0–0.2)

## 2021-11-04 LAB — CMP (CANCER CENTER ONLY)
ALT: 22 U/L (ref 0–44)
AST: 21 U/L (ref 15–41)
Albumin: 3.9 g/dL (ref 3.5–5.0)
Alkaline Phosphatase: 100 U/L (ref 38–126)
Anion gap: 8 (ref 5–15)
BUN: 15 mg/dL (ref 6–20)
CO2: 28 mmol/L (ref 22–32)
Calcium: 9.1 mg/dL (ref 8.9–10.3)
Chloride: 103 mmol/L (ref 98–111)
Creatinine: 0.85 mg/dL (ref 0.44–1.00)
GFR, Estimated: >60 mL/min (ref >60)
Glucose, Bld: 139 mg/dL — ABNORMAL HIGH (ref 70–99)
Potassium: 3.1 mmol/L — ABNORMAL LOW (ref 3.5–5.1)
Sodium: 139 mmol/L (ref 135–145)
Total Bilirubin: 0.3 mg/dL (ref 0.3–1.2)
Total Protein: 7.6 g/dL (ref 6.5–8.1)

## 2021-11-04 LAB — SAMPLE TO BLOOD BANK

## 2021-11-04 LAB — ABO/RH: ABO/RH(D): O POS

## 2021-11-04 LAB — PREPARE RBC (CROSSMATCH)

## 2021-11-04 MED ORDER — SODIUM CHLORIDE 0.9 % IV SOLN
80.0000 mg/m2 | Freq: Once | INTRAVENOUS | Status: AC
  Administered 2021-11-04: 162 mg via INTRAVENOUS
  Filled 2021-11-04: qty 27

## 2021-11-04 MED ORDER — SODIUM CHLORIDE 0.9 % IV SOLN: Freq: Once | INTRAVENOUS | Status: AC

## 2021-11-04 MED ORDER — POTASSIUM CHLORIDE CRYS ER 20 MEQ PO TBCR: 20.0000 meq | EXTENDED_RELEASE_TABLET | Freq: Two times a day (BID) | ORAL | 0 refills | Status: DC

## 2021-11-04 MED ORDER — POTASSIUM CHLORIDE CRYS ER 20 MEQ PO TBCR: 20.0000 meq | EXTENDED_RELEASE_TABLET | Freq: Once | ORAL | Status: DC

## 2021-11-04 MED ORDER — SODIUM CHLORIDE 0.9% FLUSH
10.0000 mL | INTRAVENOUS | Status: DC | PRN
  Administered 2021-11-04: 10 mL

## 2021-11-04 MED ORDER — HEPARIN SOD (PORK) LOCK FLUSH 100 UNIT/ML IV SOLN
500.0000 [IU] | Freq: Once | INTRAVENOUS | Status: AC | PRN
  Administered 2021-11-04: 500 [IU]

## 2021-11-04 MED ORDER — DIPHENHYDRAMINE HCL 50 MG/ML IJ SOLN
25.0000 mg | Freq: Once | INTRAMUSCULAR | Status: AC
  Administered 2021-11-04: 25 mg via INTRAVENOUS
  Filled 2021-11-04: qty 1

## 2021-11-04 MED ORDER — FAMOTIDINE IN NACL 20-0.9 MG/50ML-% IV SOLN
20.0000 mg | Freq: Once | INTRAVENOUS | Status: AC
  Administered 2021-11-04: 20 mg via INTRAVENOUS
  Filled 2021-11-04: qty 50

## 2021-11-04 MED ORDER — SODIUM CHLORIDE 0.9% FLUSH
10.0000 mL | Freq: Once | INTRAVENOUS | Status: AC
  Administered 2021-11-04: 10 mL

## 2021-11-04 MED ORDER — POTASSIUM CHLORIDE CRYS ER 20 MEQ PO TBCR
20.0000 meq | EXTENDED_RELEASE_TABLET | Freq: Once | ORAL | Status: AC
  Administered 2021-11-04: 20 meq via ORAL
  Filled 2021-11-04: qty 1

## 2021-11-04 NOTE — Progress Notes (Signed)
Per Regan Rakers, PA okay to treat with hgb 7.5 g/dL today. Pt to receive blood later this week.  ?

## 2021-11-04 NOTE — Patient Instructions (Signed)
[facility]  Discharge Instructions: ?Thank you for choosing [facility] to provide your oncology and hematology care.  ? ?If you have a lab appointment with the [facility], please go directly to the [facility] and check in at the registration area. ?  ?Wear comfortable clothing and clothing appropriate for easy access to any Portacath or PICC line.  ? ?We strive to give you quality time with your provider. You may need to reschedule your appointment if you arrive late (15 or more minutes).  Arriving late affects you and other patients whose appointments are after yours.  Also, if you miss three or more appointments without notifying the office, you may be dismissed from the clinic at the provider?s discretion.    ?  ?For prescription refill requests, have your pharmacy contact our office and allow 72 hours for refills to be completed.   ? ?Today you received the following chemotherapy and/or immunotherapy agents: Taxol.    ?  ?To help prevent nausea and vomiting after your treatment, we encourage you to take your nausea medication as directed. ? ?BELOW ARE SYMPTOMS THAT SHOULD BE REPORTED IMMEDIATELY: ?*FEVER GREATER THAN 100.4 F (38 ?C) OR HIGHER ?*CHILLS OR SWEATING ?*NAUSEA AND VOMITING THAT IS NOT CONTROLLED WITH YOUR NAUSEA MEDICATION ?*UNUSUAL SHORTNESS OF BREATH ?*UNUSUAL BRUISING OR BLEEDING ?*URINARY PROBLEMS (pain or burning when urinating, or frequent urination) ?*BOWEL PROBLEMS (unusual diarrhea, constipation, pain near the anus) ?TENDERNESS IN MOUTH AND THROAT WITH OR WITHOUT PRESENCE OF ULCERS (sore throat, sores in mouth, or a toothache) ?UNUSUAL RASH, SWELLING OR PAIN  ?UNUSUAL VAGINAL DISCHARGE OR ITCHING  ? ?Items with * indicate a potential emergency and should be followed up as soon as possible or go to the Emergency Department if any problems should occur. ? ?Please show the CHEMOTHERAPY ALERT CARD or IMMUNOTHERAPY ALERT CARD at check-in to the  Emergency Department and triage nurse. ? ?Should you have questions after your visit or need to cancel or reschedule your appointment, please contact [facility]  Dept: [phone]  and follow the prompts.  Office hours are 8:00 a.m. to 4:30 p.m. Monday - Friday. Please note that voicemails left after 4:00 p.m. may not be returned until the following business day.  We are closed weekends and major holidays. You have access to a nurse at all times for urgent questions. Please call the main number to the clinic Dept: [phone] and follow the prompts. ? ? ?For any non-urgent questions, you may also contact your provider using MyChart. We now offer e-Visits for anyone 48 and older to request care online for non-urgent symptoms. For details visit [email or web address]. ?  ?Also download the MyChart app! Go to the app store, search "MyChart", open the app, select [facility], and log in with your MyChart username and password. ? ?Due to Covid, a mask is required upon entering the hospital/clinic. If you do not have a mask, one will be given to you upon arrival. For doctor visits, patients may have 1 support person aged 24 or older with them. For treatment visits, patients cannot have anyone with them due to current Covid guidelines and our immunocompromised population.  ? ?

## 2021-11-05 ENCOUNTER — Encounter: Payer: Self-pay | Admitting: Nurse Practitioner

## 2021-11-05 ENCOUNTER — Inpatient Hospital Stay: Payer: Federal, State, Local not specified - PPO

## 2021-11-05 ENCOUNTER — Telehealth: Payer: Self-pay | Admitting: Hematology

## 2021-11-05 VITALS — BP 133/86 | HR 88 | Temp 99.0°F | Resp 20

## 2021-11-05 DIAGNOSIS — D649 Anemia, unspecified: Secondary | ICD-10-CM | POA: Diagnosis not present

## 2021-11-05 DIAGNOSIS — Z79899 Other long term (current) drug therapy: Secondary | ICD-10-CM | POA: Diagnosis not present

## 2021-11-05 DIAGNOSIS — C50312 Malignant neoplasm of lower-inner quadrant of left female breast: Secondary | ICD-10-CM | POA: Diagnosis not present

## 2021-11-05 DIAGNOSIS — Z170 Estrogen receptor positive status [ER+]: Secondary | ICD-10-CM

## 2021-11-05 DIAGNOSIS — Z5111 Encounter for antineoplastic chemotherapy: Secondary | ICD-10-CM | POA: Diagnosis not present

## 2021-11-05 DIAGNOSIS — Z5112 Encounter for antineoplastic immunotherapy: Secondary | ICD-10-CM | POA: Diagnosis not present

## 2021-11-05 DIAGNOSIS — Z5189 Encounter for other specified aftercare: Secondary | ICD-10-CM | POA: Diagnosis not present

## 2021-11-05 MED ORDER — FILGRASTIM-AAFI 480 MCG/0.8ML IJ SOSY
480.0000 ug | PREFILLED_SYRINGE | Freq: Once | INTRAMUSCULAR | Status: AC
  Administered 2021-11-05: 480 ug via SUBCUTANEOUS
  Filled 2021-11-05: qty 0.8

## 2021-11-05 NOTE — Telephone Encounter (Signed)
Scheduled follow-up appointments per 4/5 los. Patient is aware. ?

## 2021-11-06 ENCOUNTER — Inpatient Hospital Stay: Payer: Federal, State, Local not specified - PPO

## 2021-11-06 ENCOUNTER — Other Ambulatory Visit: Payer: Self-pay

## 2021-11-06 VITALS — BP 131/71 | HR 97 | Temp 98.5°F | Resp 18

## 2021-11-06 DIAGNOSIS — D649 Anemia, unspecified: Secondary | ICD-10-CM | POA: Diagnosis not present

## 2021-11-06 DIAGNOSIS — Z170 Estrogen receptor positive status [ER+]: Secondary | ICD-10-CM

## 2021-11-06 DIAGNOSIS — Z5112 Encounter for antineoplastic immunotherapy: Secondary | ICD-10-CM | POA: Diagnosis not present

## 2021-11-06 DIAGNOSIS — Z5189 Encounter for other specified aftercare: Secondary | ICD-10-CM | POA: Diagnosis not present

## 2021-11-06 DIAGNOSIS — C50312 Malignant neoplasm of lower-inner quadrant of left female breast: Secondary | ICD-10-CM | POA: Diagnosis not present

## 2021-11-06 DIAGNOSIS — Z5111 Encounter for antineoplastic chemotherapy: Secondary | ICD-10-CM | POA: Diagnosis not present

## 2021-11-06 DIAGNOSIS — Z79899 Other long term (current) drug therapy: Secondary | ICD-10-CM | POA: Diagnosis not present

## 2021-11-06 MED ORDER — FILGRASTIM-AAFI 480 MCG/0.8ML IJ SOSY
480.0000 ug | PREFILLED_SYRINGE | Freq: Once | INTRAMUSCULAR | Status: AC
  Administered 2021-11-06: 480 ug via SUBCUTANEOUS
  Filled 2021-11-06: qty 0.8

## 2021-11-07 ENCOUNTER — Other Ambulatory Visit: Payer: Self-pay

## 2021-11-07 ENCOUNTER — Ambulatory Visit: Payer: Federal, State, Local not specified - PPO

## 2021-11-07 ENCOUNTER — Inpatient Hospital Stay: Payer: Federal, State, Local not specified - PPO

## 2021-11-07 VITALS — BP 116/70 | HR 97 | Temp 98.9°F | Resp 18

## 2021-11-07 DIAGNOSIS — Z79899 Other long term (current) drug therapy: Secondary | ICD-10-CM | POA: Diagnosis not present

## 2021-11-07 DIAGNOSIS — Z5111 Encounter for antineoplastic chemotherapy: Secondary | ICD-10-CM | POA: Diagnosis not present

## 2021-11-07 DIAGNOSIS — Z5189 Encounter for other specified aftercare: Secondary | ICD-10-CM | POA: Diagnosis not present

## 2021-11-07 DIAGNOSIS — Z170 Estrogen receptor positive status [ER+]: Secondary | ICD-10-CM

## 2021-11-07 DIAGNOSIS — Z5112 Encounter for antineoplastic immunotherapy: Secondary | ICD-10-CM | POA: Diagnosis not present

## 2021-11-07 DIAGNOSIS — C50312 Malignant neoplasm of lower-inner quadrant of left female breast: Secondary | ICD-10-CM | POA: Diagnosis not present

## 2021-11-07 DIAGNOSIS — D649 Anemia, unspecified: Secondary | ICD-10-CM | POA: Diagnosis not present

## 2021-11-07 MED ORDER — HEPARIN SOD (PORK) LOCK FLUSH 100 UNIT/ML IV SOLN: 500.0000 [IU] | Freq: Every day | INTRAVENOUS | Status: DC | PRN

## 2021-11-07 MED ORDER — FILGRASTIM-AAFI 480 MCG/0.8ML IJ SOSY
480.0000 ug | PREFILLED_SYRINGE | Freq: Once | INTRAMUSCULAR | Status: AC
  Administered 2021-11-07: 480 ug via SUBCUTANEOUS
  Filled 2021-11-07: qty 0.8

## 2021-11-07 MED ORDER — SODIUM CHLORIDE 0.9% IV SOLUTION
250.0000 mL | Freq: Once | INTRAVENOUS | Status: AC
  Administered 2021-11-07: 250 mL via INTRAVENOUS

## 2021-11-07 NOTE — Patient Instructions (Signed)
[facility]  Discharge Instructions: ?Thank you for choosing [facility] to provide your oncology and hematology care.  ? ?If you have a lab appointment with the [facility], please go directly to the [facility] and check in at the registration area. ?  ?Wear comfortable clothing and clothing appropriate for easy access to any Portacath or PICC line.  ? ?We strive to give you quality time with your provider. You may need to reschedule your appointment if you arrive late (15 or more minutes).  Arriving late affects you and other patients whose appointments are after yours.  Also, if you miss three or more appointments without notifying the office, you may be dismissed from the clinic at the provider?s discretion.    ?  ?For prescription refill requests, have your pharmacy contact our office and allow 72 hours for refills to be completed.   ? ?Today you received one unit of blood today ?  ?To help prevent nausea and vomiting after your treatment, we encourage you to take your nausea medication as directed. ? ?BELOW ARE SYMPTOMS THAT SHOULD BE REPORTED IMMEDIATELY: ?*FEVER GREATER THAN 100.4 F (38 ?C) OR HIGHER ?*CHILLS OR SWEATING ?*NAUSEA AND VOMITING THAT IS NOT CONTROLLED WITH YOUR NAUSEA MEDICATION ?*UNUSUAL SHORTNESS OF BREATH ?*UNUSUAL BRUISING OR BLEEDING ?*URINARY PROBLEMS (pain or burning when urinating, or frequent urination) ?*BOWEL PROBLEMS (unusual diarrhea, constipation, pain near the anus) ?TENDERNESS IN MOUTH AND THROAT WITH OR WITHOUT PRESENCE OF ULCERS (sore throat, sores in mouth, or a toothache) ?UNUSUAL RASH, SWELLING OR PAIN  ?UNUSUAL VAGINAL DISCHARGE OR ITCHING  ? ?Items with * indicate a potential emergency and should be followed up as soon as possible or go to the Emergency Department if any problems should occur. ? ?Please show the CHEMOTHERAPY ALERT CARD or IMMUNOTHERAPY ALERT CARD at check-in to the Emergency Department and triage  nurse. ? ?Should you have questions after your visit or need to cancel or reschedule your appointment, please contact [facility]   Dept: [phone]  and follow the prompts.  Office hours are 8:00 a.m. to 4:30 p.m. Monday - Friday. Please note that voicemails left after 4:00 p.m. may not be returned until the following business day.  We are closed weekends and major holidays. You have access to a nurse at all times for urgent questions. Please call the main number to the clinic Dept: [phone] and follow the prompts. ? ? ?For any non-urgent questions, you may also contact your provider using MyChart. We now offer e-Visits for anyone 28 and older to request care online for non-urgent symptoms. For details visit [email or web address]. ?  ?Also download the MyChart app! Go to the app store, search "MyChart", open the app, select [facility], and log in with your MyChart username and password. ? ?Due to Covid, a mask is required upon entering the hospital/clinic. If you do not have a mask, one will be given to you upon arrival. For doctor visits, patients may have 1 support person aged 69 or older with them. For treatment visits, patients cannot have anyone with them due to current Covid guidelines and our immunocompromised population.  ? ?

## 2021-11-08 LAB — TYPE AND SCREEN
ABO/RH(D): O POS
Antibody Screen: NEGATIVE
Unit division: 0

## 2021-11-08 LAB — BPAM RBC
Blood Product Expiration Date: 202305012359
ISSUE DATE / TIME: 202304080829
Unit Type and Rh: 5100

## 2021-11-11 ENCOUNTER — Inpatient Hospital Stay (HOSPITAL_BASED_OUTPATIENT_CLINIC_OR_DEPARTMENT_OTHER): Payer: Federal, State, Local not specified - PPO | Admitting: Hematology

## 2021-11-11 ENCOUNTER — Inpatient Hospital Stay: Payer: Federal, State, Local not specified - PPO

## 2021-11-11 ENCOUNTER — Other Ambulatory Visit: Payer: Self-pay

## 2021-11-11 ENCOUNTER — Encounter: Payer: Self-pay | Admitting: Hematology

## 2021-11-11 VITALS — BP 140/79 | HR 100 | Temp 98.6°F | Resp 17

## 2021-11-11 DIAGNOSIS — Z5112 Encounter for antineoplastic immunotherapy: Secondary | ICD-10-CM | POA: Diagnosis not present

## 2021-11-11 DIAGNOSIS — C50312 Malignant neoplasm of lower-inner quadrant of left female breast: Secondary | ICD-10-CM

## 2021-11-11 DIAGNOSIS — Z170 Estrogen receptor positive status [ER+]: Secondary | ICD-10-CM | POA: Diagnosis not present

## 2021-11-11 DIAGNOSIS — Z79899 Other long term (current) drug therapy: Secondary | ICD-10-CM | POA: Diagnosis not present

## 2021-11-11 DIAGNOSIS — Z5111 Encounter for antineoplastic chemotherapy: Secondary | ICD-10-CM | POA: Diagnosis not present

## 2021-11-11 DIAGNOSIS — Z95828 Presence of other vascular implants and grafts: Secondary | ICD-10-CM

## 2021-11-11 DIAGNOSIS — D649 Anemia, unspecified: Secondary | ICD-10-CM | POA: Diagnosis not present

## 2021-11-11 DIAGNOSIS — Z5189 Encounter for other specified aftercare: Secondary | ICD-10-CM | POA: Diagnosis not present

## 2021-11-11 LAB — CBC WITH DIFFERENTIAL (CANCER CENTER ONLY)
Abs Immature Granulocytes: 0.11 10*3/uL — ABNORMAL HIGH (ref 0.00–0.07)
Basophils Absolute: 0 10*3/uL (ref 0.0–0.1)
Basophils Relative: 0 %
Eosinophils Absolute: 0.1 10*3/uL (ref 0.0–0.5)
Eosinophils Relative: 1 %
HCT: 26 % — ABNORMAL LOW (ref 36.0–46.0)
Hemoglobin: 8.9 g/dL — ABNORMAL LOW (ref 12.0–15.0)
Immature Granulocytes: 2 %
Lymphocytes Relative: 42 %
Lymphs Abs: 3 10*3/uL (ref 0.7–4.0)
MCH: 30.1 pg (ref 26.0–34.0)
MCHC: 34.2 g/dL (ref 30.0–36.0)
MCV: 87.8 fL (ref 80.0–100.0)
Monocytes Absolute: 0.6 10*3/uL (ref 0.1–1.0)
Monocytes Relative: 9 %
Neutro Abs: 3.3 10*3/uL (ref 1.7–7.7)
Neutrophils Relative %: 46 %
Platelet Count: 146 10*3/uL — ABNORMAL LOW (ref 150–400)
RBC: 2.96 MIL/uL — ABNORMAL LOW (ref 3.87–5.11)
RDW: 16.8 % — ABNORMAL HIGH (ref 11.5–15.5)
WBC Count: 7.1 10*3/uL (ref 4.0–10.5)
nRBC: 0 % (ref 0.0–0.2)

## 2021-11-11 LAB — CMP (CANCER CENTER ONLY)
ALT: 31 U/L (ref 0–44)
AST: 21 U/L (ref 15–41)
Albumin: 3.9 g/dL (ref 3.5–5.0)
Alkaline Phosphatase: 109 U/L (ref 38–126)
Anion gap: 8 (ref 5–15)
BUN: 16 mg/dL (ref 6–20)
CO2: 26 mmol/L (ref 22–32)
Calcium: 9 mg/dL (ref 8.9–10.3)
Chloride: 104 mmol/L (ref 98–111)
Creatinine: 0.96 mg/dL (ref 0.44–1.00)
GFR, Estimated: >60 mL/min (ref >60)
Glucose, Bld: 130 mg/dL — ABNORMAL HIGH (ref 70–99)
Potassium: 3.7 mmol/L (ref 3.5–5.1)
Sodium: 138 mmol/L (ref 135–145)
Total Bilirubin: 0.4 mg/dL (ref 0.3–1.2)
Total Protein: 7.8 g/dL (ref 6.5–8.1)

## 2021-11-11 MED ORDER — SODIUM CHLORIDE 0.9 % IV SOLN: Freq: Once | INTRAVENOUS | Status: AC

## 2021-11-11 MED ORDER — PALONOSETRON HCL INJECTION 0.25 MG/5ML
0.2500 mg | Freq: Once | INTRAVENOUS | Status: AC
  Administered 2021-11-11: 0.25 mg via INTRAVENOUS
  Filled 2021-11-11: qty 5

## 2021-11-11 MED ORDER — SODIUM CHLORIDE 0.9 % IV SOLN
80.0000 mg/m2 | Freq: Once | INTRAVENOUS | Status: AC
  Administered 2021-11-11: 162 mg via INTRAVENOUS
  Filled 2021-11-11: qty 27

## 2021-11-11 MED ORDER — SODIUM CHLORIDE 0.9 % IV SOLN
698.5000 mg | Freq: Once | INTRAVENOUS | Status: AC
  Administered 2021-11-11: 700 mg via INTRAVENOUS
  Filled 2021-11-11: qty 70

## 2021-11-11 MED ORDER — SODIUM CHLORIDE 0.9% FLUSH
10.0000 mL | Freq: Once | INTRAVENOUS | Status: AC
  Administered 2021-11-11: 10 mL

## 2021-11-11 MED ORDER — DIPHENHYDRAMINE HCL 50 MG/ML IJ SOLN
25.0000 mg | Freq: Once | INTRAMUSCULAR | Status: AC
  Administered 2021-11-11: 25 mg via INTRAVENOUS
  Filled 2021-11-11: qty 1

## 2021-11-11 MED ORDER — SODIUM CHLORIDE 0.9 % IV SOLN
200.0000 mg | Freq: Once | INTRAVENOUS | Status: AC
  Administered 2021-11-11: 200 mg via INTRAVENOUS
  Filled 2021-11-11: qty 8

## 2021-11-11 MED ORDER — SODIUM CHLORIDE 0.9 % IV SOLN
150.0000 mg | Freq: Once | INTRAVENOUS | Status: AC
  Administered 2021-11-11: 150 mg via INTRAVENOUS
  Filled 2021-11-11: qty 150

## 2021-11-11 MED ORDER — SODIUM CHLORIDE 0.9% FLUSH
10.0000 mL | INTRAVENOUS | Status: DC | PRN
  Administered 2021-11-11: 10 mL

## 2021-11-11 MED ORDER — HEPARIN SOD (PORK) LOCK FLUSH 100 UNIT/ML IV SOLN
500.0000 [IU] | Freq: Once | INTRAVENOUS | Status: AC | PRN
  Administered 2021-11-11: 500 [IU]

## 2021-11-11 MED ORDER — FAMOTIDINE IN NACL 20-0.9 MG/50ML-% IV SOLN
20.0000 mg | Freq: Once | INTRAVENOUS | Status: AC
  Administered 2021-11-11: 20 mg via INTRAVENOUS
  Filled 2021-11-11: qty 50

## 2021-11-11 NOTE — Patient Instructions (Signed)
[facility]  Discharge Instructions: ?Thank you for choosing [facility] to provide your oncology and hematology care.  ? ?If you have a lab appointment with the [facility], please go directly to the [facility] and check in at the registration area. ?  ?Wear comfortable clothing and clothing appropriate for easy access to any Portacath or PICC line.  ? ?We strive to give you quality time with your provider. You may need to reschedule your appointment if you arrive late (15 or more minutes).  Arriving late affects you and other patients whose appointments are after yours.  Also, if you miss three or more appointments without notifying the office, you may be dismissed from the clinic at the provider?s discretion.    ?  ?For prescription refill requests, have your pharmacy contact our office and allow 72 hours for refills to be completed.   ? ?Today you received the following chemotherapy and/or immunotherapy agents:Keytuda, taxol, carboplatin    ?  ?To help prevent nausea and vomiting after your treatment, we encourage you to take your nausea medication as directed. ? ?BELOW ARE SYMPTOMS THAT SHOULD BE REPORTED IMMEDIATELY: ?*FEVER GREATER THAN 100.4 F (38 ?C) OR HIGHER ?*CHILLS OR SWEATING ?*NAUSEA AND VOMITING THAT IS NOT CONTROLLED WITH YOUR NAUSEA MEDICATION ?*UNUSUAL SHORTNESS OF BREATH ?*UNUSUAL BRUISING OR BLEEDING ?*URINARY PROBLEMS (pain or burning when urinating, or frequent urination) ?*BOWEL PROBLEMS (unusual diarrhea, constipation, pain near the anus) ?TENDERNESS IN MOUTH AND THROAT WITH OR WITHOUT PRESENCE OF ULCERS (sore throat, sores in mouth, or a toothache) ?UNUSUAL RASH, SWELLING OR PAIN  ?UNUSUAL VAGINAL DISCHARGE OR ITCHING  ? ?Items with * indicate a potential emergency and should be followed up as soon as possible or go to the Emergency Department if any problems should occur. ? ?Please show the CHEMOTHERAPY ALERT CARD or IMMUNOTHERAPY ALERT  CARD at check-in to the Emergency Department and triage nurse. ? ?Should you have questions after your visit or need to cancel or reschedule your appointment, please contact [facility]  Dept: [phone]  and follow the prompts.  Office hours are 8:00 a.m. to 4:30 p.m. Monday - Friday. Please note that voicemails left after 4:00 p.m. may not be returned until the following business day.  We are closed weekends and major holidays. You have access to a nurse at all times for urgent questions. Please call the main number to the clinic Dept: [phone] and follow the prompts. ? ? ?For any non-urgent questions, you may also contact your provider using MyChart. We now offer e-Visits for anyone 56 and older to request care online for non-urgent symptoms. For details visit [email or web address]. ?  ?Also download the MyChart app! Go to the app store, search "MyChart", open the app, select [facility], and log in with your MyChart username and password. ? ?Due to Covid, a mask is required upon entering the hospital/clinic. If you do not have a mask, one will be given to you upon arrival. For doctor visits, patients may have 1 support person aged 80 or older with them. For treatment visits, patients cannot have anyone with them due to current Covid guidelines and our immunocompromised population.  ? ?

## 2021-11-11 NOTE — Progress Notes (Signed)
?[name]   ?Telephone:([phone]) [phone] [phone]   ?Clinic Follow up Note  ? ?Patient Care Team: ?Billie Ruddy, MD as PCP - General (Family Medicine) ?Erroll Luna, MD as Consulting Physician (General Surgery) ?Truitt Merle, MD as Consulting Physician (Hematology) ?Gery Pray, MD as Consulting Physician (Radiation Oncology) ? ?Date of Service:  11/11/2021 ? ?CHIEF COMPLAINT: f/u of left breast cancer ? ?CURRENT THERAPY:  ?Neoadjuvant Carbo d1 and Taxol d1, 8, 15, q3weeks, starting 09/09/21 ?-Keytruda, q3weeks, starting 09/09/21 ? ?ASSESSMENT & PLAN:  ?Cheyenne [name] a 41 y.o. female with  ? ?1. Malignant neoplasm of lower-inner quadrant of left breast, Stage IIIA, c(T2, N1), Functionally triple negative, Grade 3 ?-presented with palpable left breast mass. B/l MM and left [place] on 08/07/21 showed: 2.8 cm left breast mass at 7:30; at least 4 abnormal lymph nodes. Biopsy that day confirmed IDC in breast but node was negative. ?-breast MRI on 08/26/21 and PET on 08/31/21 were negative aside from primary mass. ?-Her breast cancer [name] ER 2% positive, PR and HER2 negative, this [name] similar to triple negative breast cancer.   ?-port placed 09/08/21 ?-she began neoadjuvant carbo/taxol and keytruda on 09/09/20. This will be followed by dose dense Adriamycin and Cytoxan every 2 weeks for 4 cycles, along with immunotherapy Keytruda every 3 weeks for 1 year.  ?-she has tolerated carbo/taxol well. Her breast exam has shown improvement, and the primary mass [name] no longer palpable. Labs reviewed, overall stable. We will proceed with cycle 4.  ?-she will switch to dose dense Adriamycin and Cytoxan in 2-3 weeks. ?  ?2. Social Support ?-she has good support from her wife. ?-she has a history of anxiety and [name] on Xanax as needed. I advised her to f/u with her counselor. ?  ?  ?PLAN:  ?-proceed with C4D1 carbo/taxol and Keytruda today ?-lab, flush, and taxol weekly x2,start AC in 3 weeks  ?-f/u in 2 weeks ? ? ?No  problem-specific Assessment & Plan notes found for this encounter. ? ? ?SUMMARY OF ONCOLOGIC HISTORY: ?Oncology History Overview Note  ? Cancer Staging  ?Malignant neoplasm of lower-inner quadrant of left breast in female, estrogen receptor positive ([facility]) ?Staging form: Breast, AJCC 8th Edition ?- Clinical stage from 08/07/2021: Stage IIIA (cT2, cN1, cM0, G3, ER+, PR-, HER2-) - Signed by Truitt Merle, MD on 08/18/2021 ? ?  ?Malignant neoplasm of lower-inner quadrant of left breast in female, estrogen receptor positive ([facility])  ?08/07/2021 Cancer Staging  ? Staging form: Breast, AJCC 8th Edition ?- Clinical stage from 08/07/2021: Stage IIB (cT2, cN0, cM0, G3, ER-, PR-, HER2-) - Signed by Truitt Merle, MD on 09/24/2021 ?Stage prefix: Initial diagnosis ?Histologic grading system: 3 grade system ? ?  ?08/07/2021 Mammogram  ? CLINICAL DATA:  Palpable mass in the LEFT breast since August. ?  ?EXAM: ?DIGITAL DIAGNOSTIC BILATERAL MAMMOGRAM WITH TOMOSYNTHESIS AND CAD; ?ULTRASOUND LEFT BREAST LIMITED ? ?IMPRESSION: ?1. Suspicious mass in the 7:30 o'clock location of the LEFT breast ?for which biopsy [name] recommended. ?2. LEFT axillary adenopathy, with at least 4 abnormal lymph nodes. ?  ?08/07/2021 Initial Biopsy  ? Diagnosis ?1. Breast, left, needle core biopsy, 7:30, ribbon clip ?- INVASIVE DUCTAL CARCINOMA ?- SEE COMMENT ?2. Lymph node, needle/core biopsy, left axilla, coil clip ?- NO CARCINOMA IDENTIFIED ?Microscopic Comment ?1. based on the biopsy, the carcinoma appears Nottingham grade 3 of 3 and measures 1.1 cm in greatest linear extent. ? ?1. PROGNOSTIC INDICATORS ?Results: ?The tumor cells are EQUIVOCAL for Her2 (  2+). Her2 by FISH will be performed and results reported separately. ?Estrogen Receptor: 2%, POSITIVE, WEAK STAINING INTENSITY ?Progesterone Receptor: <1%, NEGATIVE ?Proliferation Marker Ki67: 40% ? ?1. FLUORESCENCE IN-SITU HYBRIDIZATION ?Results: ?GROUP 5: HER2 **NEGATIVE** ?  ?08/17/2021 Initial Diagnosis  ? Malignant neoplasm  of lower-inner quadrant of left breast in female, estrogen receptor positive ([facility]) ?  ?08/19/2021 Genetic Testing  ? [facility] CancerNext-Expanded Panel [name] Negative. Report date [name] 09/02/2021. ? ?The CancerNext-Expanded gene panel offered by [facility] and includes sequencing, rearrangement, and RNA analysis for the following 77 genes: AIP, ALK, APC, ATM, AXIN2, BAP1, BARD1, BLM, BMPR1A, BRCA1, BRCA2, BRIP1, CDC73, CDH1, CDK4, CDKN1B, CDKN2A, CHEK2, CTNNA1, DICER1, FANCC, FH, FLCN, GALNT12, KIF1B, LZTR1, MAX, MEN1, MET, MLH1, MSH2, MSH3, MSH6, MUTYH, NBN, NF1, NF2, NTHL1, PALB2, PHOX2B, PMS2, POT1, PRKAR1A, PTCH1, PTEN, RAD51C, RAD51D, RB1, RECQL, RET, SDHA, SDHAF2, SDHB, SDHC, SDHD, SMAD4, SMARCA4, SMARCB1, SMARCE1, STK11, SUFU, TMEM127, TP53, TSC1, TSC2, VHL and XRCC2 (sequencing and deletion/duplication); EGFR, EGLN1, HOXB13, KIT, MITF, PDGFRA, POLD1, and POLE (sequencing only); EPCAM and GREM1 (deletion/duplication only).  ?  ?08/26/2021 Imaging  ? EXAM: ?BILATERAL BREAST MRI WITH AND WITHOUT CONTRAST ? ?IMPRESSION: ?2.3 cm mass in the lower-inner quadrant of the left breast ?corresponding with the biopsy proven invasive ductal carcinoma. ?  ?08/31/2021 PET scan  ? IMPRESSION: ?1. Known mass medially in the left breast [name] markedly hypermetabolic consistent with breast cancer. ?2. No evidence of nodal metastases within the chest. No typical ?distant metastases. ?3. Suspected acute inflammatory process involving central mesentery and pelvis with ill-defined focal hypermetabolic activity anterior to the sacrum. There are pelvic inflammatory changes and free pelvic fluid. Recent labs demonstrate mild leukocytosis. Differential considerations include acute appendicitis, enteritis and small perforated foreign body. No evidence of drainable fluid collection, pneumoperitoneum or bowel obstruction. ?4. These results were called by telephone at the time of ?interpretation on 08/31/2021 at 5:12 pm to on-call oncology  nurse, ?[name], who verbally acknowledged these results. Unless there [name] a good clinical explanation for these findings, evaluation in the ?emergency department [name] likely warranted, and patient may benefit ?from abdominopelvic CT with oral and intravenous contrast. ?  ?09/01/2021 Imaging  ? EXAM: ?CT ABDOMEN AND PELVIS WITH CONTRAST ? ?IMPRESSION: ?Multiple thick-walled loops of distal small bowel, suggesting ?infectious/inflammatory enteritis. Associated small volume pelvic ?ascites. No pneumatosis or free air. ?  ?No evidence of bowel obstruction.  Normal appendix. ?  ?2.1 cm lesion in the medial left breast, likely corresponding to the ?patient's known primary breast neoplasm. No findings suspicious for metastatic disease. ?  ?09/09/2021 -  Chemotherapy  ? Patient [name] on Treatment Plan : BREAST Pembrolizumab (200) D1 + Carboplatin (5) D1 + Paclitaxel (80) D1,8,15 q21d X 4 cycles / Pembrolizumab (200) D1 + AC D1 q21d x 4 cycles  ?   ? ? ? ?INTERVAL HISTORY:  ?Cheyenne [name] [name] here for a follow up of breast cancer. She was last seen by NP Lacie on 11/04/21. She was seen in the infusion area. ?She reports she [name] doing well overall, stable on treatment. ?  ?All other systems were reviewed with the patient and are negative. ? ?MEDICAL HISTORY:  ?Past Medical History:  ?Diagnosis Date  ? Anal fissure   ? Anxiety   ? Breast cancer ([facility]) 08/10/21  ? Date diagnosis was given  ? DVT (deep venous thrombosis) ([facility])   ? Incomplete right bundle branch block (RBBB) determined by electrocardiography 09/01/2021  ? ? ?SURGICAL HISTORY: ?Past Surgical History:  ?Procedure Laterality  Date  ? NO PAST SURGERIES    ? PORTACATH PLACEMENT N/A 09/08/2021  ? Procedure: INSERTION PORT-A-CATH;  Surgeon: Erroll Luna, MD;  Location: [facility];  Service: General;  Laterality: N/A;  ? ? ?I have reviewed the social history and family history with the patient and they are unchanged from previous note. ? ?ALLERGIES:  has No Known  Allergies. ? ?MEDICATIONS:  ?Current Outpatient Medications  ?Medication Sig Dispense Refill  ? acetaminophen (TYLENOL) 500 MG tablet Take 500-1,000 mg by mouth every 6 (six) hours as needed (pain.).    ? ALAYCEN 1/35 tablet Take 1 tab

## 2021-11-12 ENCOUNTER — Telehealth: Payer: Self-pay | Admitting: Hematology

## 2021-11-12 NOTE — Telephone Encounter (Signed)
Left message with follow-up appointment per 4/12 los. ?

## 2021-11-18 ENCOUNTER — Other Ambulatory Visit: Payer: Self-pay

## 2021-11-18 ENCOUNTER — Inpatient Hospital Stay: Payer: Federal, State, Local not specified - PPO

## 2021-11-18 ENCOUNTER — Inpatient Hospital Stay (HOSPITAL_BASED_OUTPATIENT_CLINIC_OR_DEPARTMENT_OTHER): Payer: Federal, State, Local not specified - PPO | Admitting: Physician Assistant

## 2021-11-18 VITALS — BP 130/73 | HR 93 | Temp 97.3°F | Resp 20 | Wt 204.4 lb

## 2021-11-18 VITALS — BP 112/81 | HR 94 | Temp 98.0°F | Resp 16

## 2021-11-18 DIAGNOSIS — Z5189 Encounter for other specified aftercare: Secondary | ICD-10-CM | POA: Diagnosis not present

## 2021-11-18 DIAGNOSIS — Z170 Estrogen receptor positive status [ER+]: Secondary | ICD-10-CM

## 2021-11-18 DIAGNOSIS — Z79899 Other long term (current) drug therapy: Secondary | ICD-10-CM | POA: Diagnosis not present

## 2021-11-18 DIAGNOSIS — D649 Anemia, unspecified: Secondary | ICD-10-CM

## 2021-11-18 DIAGNOSIS — Z5111 Encounter for antineoplastic chemotherapy: Secondary | ICD-10-CM | POA: Diagnosis not present

## 2021-11-18 DIAGNOSIS — Z5112 Encounter for antineoplastic immunotherapy: Secondary | ICD-10-CM | POA: Diagnosis not present

## 2021-11-18 DIAGNOSIS — C50312 Malignant neoplasm of lower-inner quadrant of left female breast: Secondary | ICD-10-CM

## 2021-11-18 DIAGNOSIS — Z95828 Presence of other vascular implants and grafts: Secondary | ICD-10-CM

## 2021-11-18 LAB — CBC WITH DIFFERENTIAL (CANCER CENTER ONLY)
Abs Immature Granulocytes: 0.03 10*3/uL (ref 0.00–0.07)
Basophils Absolute: 0 10*3/uL (ref 0.0–0.1)
Basophils Relative: 1 %
Eosinophils Absolute: 0.1 10*3/uL (ref 0.0–0.5)
Eosinophils Relative: 2 %
HCT: 22.6 % — ABNORMAL LOW (ref 36.0–46.0)
Hemoglobin: 7.9 g/dL — ABNORMAL LOW (ref 12.0–15.0)
Immature Granulocytes: 1 %
Lymphocytes Relative: 47 %
Lymphs Abs: 2.2 10*3/uL (ref 0.7–4.0)
MCH: 31.1 pg (ref 26.0–34.0)
MCHC: 35 g/dL (ref 30.0–36.0)
MCV: 89 fL (ref 80.0–100.0)
Monocytes Absolute: 0.2 10*3/uL (ref 0.1–1.0)
Monocytes Relative: 4 %
Neutro Abs: 2.1 10*3/uL (ref 1.7–7.7)
Neutrophils Relative %: 45 %
Platelet Count: 214 10*3/uL (ref 150–400)
RBC: 2.54 MIL/uL — ABNORMAL LOW (ref 3.87–5.11)
RDW: 17.6 % — ABNORMAL HIGH (ref 11.5–15.5)
Smear Review: NORMAL
WBC Count: 4.6 10*3/uL (ref 4.0–10.5)
nRBC: 0 % (ref 0.0–0.2)

## 2021-11-18 LAB — CMP (CANCER CENTER ONLY)
ALT: 21 U/L (ref 0–44)
AST: 20 U/L (ref 15–41)
Albumin: 3.9 g/dL (ref 3.5–5.0)
Alkaline Phosphatase: 94 U/L (ref 38–126)
Anion gap: 7 (ref 5–15)
BUN: 15 mg/dL (ref 6–20)
CO2: 27 mmol/L (ref 22–32)
Calcium: 9.2 mg/dL (ref 8.9–10.3)
Chloride: 104 mmol/L (ref 98–111)
Creatinine: 0.78 mg/dL (ref 0.44–1.00)
GFR, Estimated: >60 mL/min (ref >60)
Glucose, Bld: 117 mg/dL — ABNORMAL HIGH (ref 70–99)
Potassium: 3.5 mmol/L (ref 3.5–5.1)
Sodium: 138 mmol/L (ref 135–145)
Total Bilirubin: 0.5 mg/dL (ref 0.3–1.2)
Total Protein: 7.6 g/dL (ref 6.5–8.1)

## 2021-11-18 LAB — PREPARE RBC (CROSSMATCH)

## 2021-11-18 LAB — T4, FREE: Free T4: 0.74 ng/dL (ref 0.61–1.12)

## 2021-11-18 LAB — TSH: TSH: 2.386 u[IU]/mL (ref 0.350–4.500)

## 2021-11-18 MED ORDER — DIPHENHYDRAMINE HCL 50 MG/ML IJ SOLN
25.0000 mg | Freq: Once | INTRAMUSCULAR | Status: AC
  Administered 2021-11-18: 25 mg via INTRAVENOUS

## 2021-11-18 MED ORDER — SODIUM CHLORIDE 0.9 % IV SOLN
80.0000 mg/m2 | Freq: Once | INTRAVENOUS | Status: AC
  Administered 2021-11-18: 162 mg via INTRAVENOUS
  Filled 2021-11-18: qty 27

## 2021-11-18 MED ORDER — ACETAMINOPHEN 325 MG PO TABS
650.0000 mg | ORAL_TABLET | Freq: Once | ORAL | Status: AC
  Administered 2021-11-18: 650 mg via ORAL

## 2021-11-18 MED ORDER — SODIUM CHLORIDE 0.9 % IV SOLN: Freq: Once | INTRAVENOUS | Status: AC

## 2021-11-18 MED ORDER — SODIUM CHLORIDE 0.9% FLUSH
10.0000 mL | Freq: Once | INTRAVENOUS | Status: AC
  Administered 2021-11-18: 10 mL

## 2021-11-18 MED ORDER — FAMOTIDINE IN NACL 20-0.9 MG/50ML-% IV SOLN
20.0000 mg | Freq: Once | INTRAVENOUS | Status: AC
  Administered 2021-11-18: 20 mg via INTRAVENOUS

## 2021-11-18 MED ORDER — HEPARIN SOD (PORK) LOCK FLUSH 100 UNIT/ML IV SOLN
500.0000 [IU] | Freq: Once | INTRAVENOUS | Status: AC | PRN
  Administered 2021-11-18: 500 [IU]

## 2021-11-18 MED ORDER — SODIUM CHLORIDE 0.9% FLUSH
10.0000 mL | INTRAVENOUS | Status: DC | PRN
  Administered 2021-11-18: 10 mL

## 2021-11-18 NOTE — Patient Instructions (Signed)
[facility]  Discharge Instructions: ?Thank you for choosing [facility] to provide your oncology and hematology care.  ? ?If you have a lab appointment with the [facility], please go directly to the [facility] and check in at the registration area. ?  ?Wear comfortable clothing and clothing appropriate for easy access to any Portacath or PICC line.  ? ?We strive to give you quality time with your provider. You may need to reschedule your appointment if you arrive late (15 or more minutes).  Arriving late affects you and other patients whose appointments are after yours.  Also, if you miss three or more appointments without notifying the office, you may be dismissed from the clinic at the provider?s discretion.    ?  ?For prescription refill requests, have your pharmacy contact our office and allow 72 hours for refills to be completed.   ? ?Today you received the following chemotherapy and/or immunotherapy agents: Taxol.    ?  ?To help prevent nausea and vomiting after your treatment, we encourage you to take your nausea medication as directed. ? ?BELOW ARE SYMPTOMS THAT SHOULD BE REPORTED IMMEDIATELY: ?*FEVER GREATER THAN 100.4 F (38 ?C) OR HIGHER ?*CHILLS OR SWEATING ?*NAUSEA AND VOMITING THAT IS NOT CONTROLLED WITH YOUR NAUSEA MEDICATION ?*UNUSUAL SHORTNESS OF BREATH ?*UNUSUAL BRUISING OR BLEEDING ?*URINARY PROBLEMS (pain or burning when urinating, or frequent urination) ?*BOWEL PROBLEMS (unusual diarrhea, constipation, pain near the anus) ?TENDERNESS IN MOUTH AND THROAT WITH OR WITHOUT PRESENCE OF ULCERS (sore throat, sores in mouth, or a toothache) ?UNUSUAL RASH, SWELLING OR PAIN  ?UNUSUAL VAGINAL DISCHARGE OR ITCHING  ? ?Items with * indicate a potential emergency and should be followed up as soon as possible or go to the Emergency Department if any problems should occur. ? ?Please show the CHEMOTHERAPY ALERT CARD or IMMUNOTHERAPY ALERT CARD at check-in to the  Emergency Department and triage nurse. ? ?Should you have questions after your visit or need to cancel or reschedule your appointment, please contact [facility]  Dept: [phone]  and follow the prompts.  Office hours are 8:00 a.m. to 4:30 p.m. Monday - Friday. Please note that voicemails left after 4:00 p.m. may not be returned until the following business day.  We are closed weekends and major holidays. You have access to a nurse at all times for urgent questions. Please call the main number to the clinic Dept: [phone] and follow the prompts. ? ? ?For any non-urgent questions, you may also contact your provider using MyChart. We now offer e-Visits for anyone 69 and older to request care online for non-urgent symptoms. For details visit [email or web address]. ?  ?Also download the MyChart app! Go to the app store, search "MyChart", open the app, select [facility], and log in with your MyChart username and password. ? ?Due to Covid, a mask is required upon entering the hospital/clinic. If you do not have a mask, one will be given to you upon arrival. For doctor visits, patients may have 1 support person aged 42 or older with them. For treatment visits, patients cannot have anyone with them due to current Covid guidelines and our immunocompromised population.  ? ?

## 2021-11-18 NOTE — Progress Notes (Signed)
Per Dede Query, PA ok to treat today with hemoglobin of 7.9. Patient will be getting one unit of blood today. ?

## 2021-11-18 NOTE — Progress Notes (Signed)
?[place]   ?[phone] [phone]   ?Clinic Follow up Note  ? ?Patient Care Team: ?Billie Ruddy, MD as PCP - General (Family Medicine) ?Erroll Luna, MD as Consulting Physician (General Surgery) ?Truitt Merle, MD as Consulting Physician (Hematology) ?Gery Pray, MD as Consulting Physician (Radiation Oncology) ? ?Date of Service:  11/18/2021 ? ?CHIEF COMPLAINT: f/u of left breast cancer ? ?CURRENT THERAPY:  ?Neoadjuvant Carbo d1 and Taxol d1, 8, 15, q3weeks, starting 09/09/21 ?-Keytruda, q3weeks, starting 09/09/21 ? ?SUMMARY OF ONCOLOGIC HISTORY: ?Oncology History Overview Note  ? Cancer Staging  ?Malignant neoplasm of lower-inner quadrant of left breast in female, estrogen receptor positive ([place]) ?Staging form: Breast, AJCC 8th Edition ?- Clinical stage from 08/07/2021: Stage IIIA (cT2, cN1, cM0, G3, ER+, PR-, HER2-) - Signed by Truitt Merle, MD on 08/18/2021 ? ?  ?Malignant neoplasm of lower-inner quadrant of left breast in female, estrogen receptor positive ([place])  ?08/07/2021 Cancer Staging  ? Staging form: Breast, AJCC 8th Edition ?- Clinical stage from 08/07/2021: Stage IIB (cT2, cN0, cM0, G3, ER-, PR-, HER2-) - Signed by Truitt Merle, MD on 09/24/2021 ?Stage prefix: Initial diagnosis ?Histologic grading system: 3 grade system ? ?  ?08/07/2021 Mammogram  ? CLINICAL DATA:  Palpable mass in the LEFT breast since August. ?  ?EXAM: ?DIGITAL DIAGNOSTIC BILATERAL MAMMOGRAM WITH TOMOSYNTHESIS AND CAD; ?ULTRASOUND LEFT BREAST LIMITED ? ?IMPRESSION: ?1. Suspicious mass in the 7:30 o'clock location of the LEFT breast ?for which biopsy is recommended. ?2. LEFT axillary adenopathy, with at least 4 abnormal lymph nodes. ?  ?08/07/2021 Initial Biopsy  ? Diagnosis ?1. Breast, left, needle core biopsy, 7:30, ribbon clip ?- INVASIVE DUCTAL CARCINOMA ?- SEE COMMENT ?2. Lymph node, needle/core biopsy, left axilla, coil clip ?- NO CARCINOMA IDENTIFIED ?Microscopic Comment ?1. based on the biopsy, the  carcinoma appears Nottingham grade 3 of 3 and measures 1.1 cm in greatest linear extent. ? ?1. PROGNOSTIC INDICATORS ?Results: ?The tumor cells are EQUIVOCAL for Her2 (2+). Her2 by FISH will be performed and results reported separately. ?Estrogen Receptor: 2%, POSITIVE, WEAK STAINING INTENSITY ?Progesterone Receptor: <1%, NEGATIVE ?Proliferation Marker Ki67: 40% ? ?1. FLUORESCENCE IN-SITU HYBRIDIZATION ?Results: ?GROUP 5: HER2 **NEGATIVE** ?  ?08/17/2021 Initial Diagnosis  ? Malignant neoplasm of lower-inner quadrant of left breast in female, estrogen receptor positive ([place]) ? ?  ?08/19/2021 Genetic Testing  ? [facility] CancerNext-Expanded Panel is Negative. Report date is 09/02/2021. ? ?The CancerNext-Expanded gene panel offered by [facility] and includes sequencing, rearrangement, and RNA analysis for the following 77 genes: AIP, ALK, APC, ATM, AXIN2, BAP1, BARD1, BLM, BMPR1A, BRCA1, BRCA2, BRIP1, CDC73, CDH1, CDK4, CDKN1B, CDKN2A, CHEK2, CTNNA1, DICER1, FANCC, FH, FLCN, GALNT12, KIF1B, LZTR1, MAX, MEN1, MET, MLH1, MSH2, MSH3, MSH6, MUTYH, NBN, NF1, NF2, NTHL1, PALB2, PHOX2B, PMS2, POT1, PRKAR1A, PTCH1, PTEN, RAD51C, RAD51D, RB1, RECQL, RET, SDHA, SDHAF2, SDHB, SDHC, SDHD, SMAD4, SMARCA4, SMARCB1, SMARCE1, STK11, SUFU, TMEM127, TP53, TSC1, TSC2, VHL and XRCC2 (sequencing and deletion/duplication); EGFR, EGLN1, HOXB13, KIT, MITF, PDGFRA, POLD1, and POLE (sequencing only); EPCAM and GREM1 (deletion/duplication only).  ?  ?08/26/2021 Imaging  ? EXAM: ?BILATERAL BREAST MRI WITH AND WITHOUT CONTRAST ? ?IMPRESSION: ?2.3 cm mass in the lower-inner quadrant of the left breast ?corresponding with the biopsy proven invasive ductal carcinoma. ?  ?08/31/2021 PET scan  ? IMPRESSION: ?1. Known mass medially in the left breast is markedly hypermetabolic consistent with breast cancer. ?2. No evidence of nodal metastases within the chest. No typical ?distant metastases. ?3. Suspected acute inflammatory process involving  central  mesentery and pelvis with ill-defined focal hypermetabolic activity anterior to the sacrum. There are pelvic inflammatory changes and free pelvic fluid. Recent labs demonstrate mild leukocytosis. Differential considerations include acute appendicitis, enteritis and small perforated foreign body. No evidence of drainable fluid collection, pneumoperitoneum or bowel obstruction. ?4. These results were called by telephone at the time of ?interpretation on 08/31/2021 at 5:12 pm to on-call oncology nurse, ?[name], who verbally acknowledged these results. Unless there is a good clinical explanation for these findings, evaluation in the ?emergency department is likely warranted, and patient may benefit ?from abdominopelvic CT with oral and intravenous contrast. ?  ?09/01/2021 Imaging  ? EXAM: ?CT ABDOMEN AND PELVIS WITH CONTRAST ? ?IMPRESSION: ?Multiple thick-walled loops of distal small bowel, suggesting ?infectious/inflammatory enteritis. Associated small volume pelvic ?ascites. No pneumatosis or free air. ?  ?No evidence of bowel obstruction.  Normal appendix. ?  ?2.1 cm lesion in the medial left breast, likely corresponding to the ?patient's known primary breast neoplasm. No findings suspicious for metastatic disease. ?  ?09/09/2021 -  Chemotherapy  ? Patient is on Treatment Plan : BREAST Pembrolizumab (200) D1 + Carboplatin (5) D1 + Paclitaxel (80) D1,8,15 q21d X 4 cycles / Pembrolizumab (200) D1 + AC D1 q21d x 4 cycles  ? ?  ?  ? ? ? ?INTERVAL HISTORY:  ?Cheyenne [name] is here for a follow up of breast cancer. She was last seen by Dr. Burr Medico on 11/11/2021. She is due for Cycle 4, Day 8 with Taxol. She is accompanied by her wife for this visit.  ? ?Cheyenne [name] reports her energy levels are fairly stable.  She does have more fatigue but continues to complete all her daily activities independently.  She reports decreased appetite secondary to taste changes with chemotherapy.  Fortunately, her weight is stable.  She had  mild nausea without any vomiting episodes.  She has intermittent episodes of abdominal pain that is diffusely located.  The abdominal pain resolves on its own and she is unaware of any triggers.  Her bowel habits are unchanged without any recurrent episodes of diarrhea or constipation.  She reports intermittent episodes of nosebleeds with the last episode 2 weeks ago.  She denies any fevers, chills, night sweats, shortness of breath, chest pain, cough, peripheral neuropathy or edema.  She has no other complaints.  ? ? ?MEDICAL HISTORY:  ?Past Medical History:  ?Diagnosis Date  ? Anal fissure   ? Anxiety   ? Breast cancer ([place]) 08/10/21  ? Date diagnosis was given  ? DVT (deep venous thrombosis) ([place])   ? Incomplete right bundle branch block (RBBB) determined by electrocardiography 09/01/2021  ? ? ?SURGICAL HISTORY: ?Past Surgical History:  ?Procedure Laterality Date  ? NO PAST SURGERIES    ? PORTACATH PLACEMENT N/A 09/08/2021  ? Procedure: INSERTION PORT-A-CATH;  Surgeon: Erroll Luna, MD;  Location: [facility];  Service: General;  Laterality: N/A;  ? ? ?I have reviewed the social history and family history with the patient and they are unchanged from previous note. ? ?ALLERGIES:  has No Known Allergies. ? ?MEDICATIONS:  ?Current Outpatient Medications  ?Medication Sig Dispense Refill  ? acetaminophen (TYLENOL) 500 MG tablet Take 500-1,000 mg by mouth every 6 (six) hours as needed (pain.).    ? ALAYCEN 1/35 tablet Take 1 tablet by mouth in the morning.  4  ? albuterol (VENTOLIN HFA) 108 (90 Base) MCG/ACT inhaler Inhale 2 puffs into the lungs every 4 (four) hours as needed for wheezing. 1  each 5  ? ALPRAZolam (XANAX) 0.5 MG tablet Take one tablet twice daily as needed for anxiety. 45 tablet 2  ? cyclobenzaprine (FLEXERIL) 10 MG tablet Take 10 mg by mouth daily as needed for muscle spasms.    ? desonide (DESOWEN) 0.05 % cream Apply topically 2 (two) times daily. (Patient taking differently: Apply 1 application topically 2  (two) times daily as needed (skin irritation.).) 30 g 1  ? desonide (DESOWEN) 0.05 % lotion Apply 1 application topically daily.    ? hydroquinone 4 % cream APPLY TO DARK SPOTS AT BEDTIME (Patient taking differently:

## 2021-11-19 ENCOUNTER — Encounter: Payer: Self-pay | Admitting: Physician Assistant

## 2021-11-19 DIAGNOSIS — R300 Dysuria: Secondary | ICD-10-CM | POA: Diagnosis not present

## 2021-11-19 LAB — BPAM RBC
Blood Product Expiration Date: 202305222359
ISSUE DATE / TIME: 202304191419
Unit Type and Rh: 5100

## 2021-11-19 LAB — TYPE AND SCREEN
ABO/RH(D): O POS
Antibody Screen: NEGATIVE
Unit division: 0

## 2021-11-24 ENCOUNTER — Encounter: Payer: Self-pay | Admitting: *Deleted

## 2021-11-25 ENCOUNTER — Other Ambulatory Visit: Payer: Self-pay

## 2021-11-25 ENCOUNTER — Inpatient Hospital Stay: Payer: Federal, State, Local not specified - PPO

## 2021-11-25 ENCOUNTER — Encounter: Payer: Self-pay | Admitting: Hematology

## 2021-11-25 ENCOUNTER — Inpatient Hospital Stay (HOSPITAL_BASED_OUTPATIENT_CLINIC_OR_DEPARTMENT_OTHER): Payer: Federal, State, Local not specified - PPO | Admitting: Hematology

## 2021-11-25 VITALS — BP 141/84 | HR 96 | Temp 98.0°F | Resp 16 | Wt 203.4 lb

## 2021-11-25 DIAGNOSIS — C50312 Malignant neoplasm of lower-inner quadrant of left female breast: Secondary | ICD-10-CM | POA: Diagnosis not present

## 2021-11-25 DIAGNOSIS — Z170 Estrogen receptor positive status [ER+]: Secondary | ICD-10-CM | POA: Diagnosis not present

## 2021-11-25 DIAGNOSIS — Z5112 Encounter for antineoplastic immunotherapy: Secondary | ICD-10-CM | POA: Diagnosis not present

## 2021-11-25 DIAGNOSIS — Z79899 Other long term (current) drug therapy: Secondary | ICD-10-CM | POA: Diagnosis not present

## 2021-11-25 DIAGNOSIS — Z5189 Encounter for other specified aftercare: Secondary | ICD-10-CM | POA: Diagnosis not present

## 2021-11-25 DIAGNOSIS — Z95828 Presence of other vascular implants and grafts: Secondary | ICD-10-CM

## 2021-11-25 DIAGNOSIS — Z5111 Encounter for antineoplastic chemotherapy: Secondary | ICD-10-CM | POA: Diagnosis not present

## 2021-11-25 DIAGNOSIS — D649 Anemia, unspecified: Secondary | ICD-10-CM | POA: Diagnosis not present

## 2021-11-25 LAB — CMP (CANCER CENTER ONLY)
ALT: 20 U/L (ref 0–44)
AST: 22 U/L (ref 15–41)
Albumin: 4 g/dL (ref 3.5–5.0)
Alkaline Phosphatase: 88 U/L (ref 38–126)
Anion gap: 7 (ref 5–15)
BUN: 15 mg/dL (ref 6–20)
CO2: 26 mmol/L (ref 22–32)
Calcium: 9.3 mg/dL (ref 8.9–10.3)
Chloride: 106 mmol/L (ref 98–111)
Creatinine: 0.93 mg/dL (ref 0.44–1.00)
GFR, Estimated: >60 mL/min (ref >60)
Glucose, Bld: 129 mg/dL — ABNORMAL HIGH (ref 70–99)
Potassium: 3.5 mmol/L (ref 3.5–5.1)
Sodium: 139 mmol/L (ref 135–145)
Total Bilirubin: 0.4 mg/dL (ref 0.3–1.2)
Total Protein: 7.5 g/dL (ref 6.5–8.1)

## 2021-11-25 LAB — CBC WITH DIFFERENTIAL (CANCER CENTER ONLY)
Abs Immature Granulocytes: 0.03 10*3/uL (ref 0.00–0.07)
Basophils Absolute: 0 10*3/uL (ref 0.0–0.1)
Basophils Relative: 0 %
Eosinophils Absolute: 0.1 10*3/uL (ref 0.0–0.5)
Eosinophils Relative: 1 %
HCT: 24 % — ABNORMAL LOW (ref 36.0–46.0)
Hemoglobin: 8.4 g/dL — ABNORMAL LOW (ref 12.0–15.0)
Immature Granulocytes: 1 %
Lymphocytes Relative: 52 %
Lymphs Abs: 2.9 10*3/uL (ref 0.7–4.0)
MCH: 31.6 pg (ref 26.0–34.0)
MCHC: 35 g/dL (ref 30.0–36.0)
MCV: 90.2 fL (ref 80.0–100.0)
Monocytes Absolute: 0.5 10*3/uL (ref 0.1–1.0)
Monocytes Relative: 9 %
Neutro Abs: 2.1 10*3/uL (ref 1.7–7.7)
Neutrophils Relative %: 37 %
Platelet Count: 205 10*3/uL (ref 150–400)
RBC: 2.66 MIL/uL — ABNORMAL LOW (ref 3.87–5.11)
RDW: 17.7 % — ABNORMAL HIGH (ref 11.5–15.5)
WBC Count: 5.7 10*3/uL (ref 4.0–10.5)
nRBC: 0 % (ref 0.0–0.2)

## 2021-11-25 MED ORDER — SODIUM CHLORIDE 0.9% FLUSH
10.0000 mL | Freq: Once | INTRAVENOUS | Status: AC
  Administered 2021-11-25: 10 mL

## 2021-11-25 MED ORDER — SODIUM CHLORIDE 0.9 % IV SOLN
80.0000 mg/m2 | Freq: Once | INTRAVENOUS | Status: AC
  Administered 2021-11-25: 162 mg via INTRAVENOUS
  Filled 2021-11-25: qty 27

## 2021-11-25 MED ORDER — DIPHENHYDRAMINE HCL 50 MG/ML IJ SOLN
25.0000 mg | Freq: Once | INTRAMUSCULAR | Status: AC
  Administered 2021-11-25: 25 mg via INTRAVENOUS
  Filled 2021-11-25: qty 1

## 2021-11-25 MED ORDER — HEPARIN SOD (PORK) LOCK FLUSH 100 UNIT/ML IV SOLN
500.0000 [IU] | Freq: Once | INTRAVENOUS | Status: AC | PRN
  Administered 2021-11-25: 500 [IU]

## 2021-11-25 MED ORDER — SODIUM CHLORIDE 0.9% FLUSH
10.0000 mL | INTRAVENOUS | Status: DC | PRN
  Administered 2021-11-25: 10 mL

## 2021-11-25 MED ORDER — SODIUM CHLORIDE 0.9 % IV SOLN: Freq: Once | INTRAVENOUS | Status: AC

## 2021-11-25 MED ORDER — FAMOTIDINE IN NACL 20-0.9 MG/50ML-% IV SOLN
20.0000 mg | Freq: Once | INTRAVENOUS | Status: AC
  Administered 2021-11-25: 20 mg via INTRAVENOUS
  Filled 2021-11-25: qty 50

## 2021-11-25 NOTE — Patient Instructions (Signed)
[facility]  Discharge Instructions: ?Thank you for choosing [facility] to provide your oncology and hematology care.  ? ?If you have a lab appointment with the [facility], please go directly to the [facility] and check in at the registration area. ?  ?Wear comfortable clothing and clothing appropriate for easy access to any Portacath or PICC line.  ? ?We strive to give you quality time with your provider. You may need to reschedule your appointment if you arrive late (15 or more minutes).  Arriving late affects you and other patients whose appointments are after yours.  Also, if you miss three or more appointments without notifying the office, you may be dismissed from the clinic at the provider?s discretion.    ?  ?For prescription refill requests, have your pharmacy contact our office and allow 72 hours for refills to be completed.   ? ?Today you received the following chemotherapy and/or immunotherapy agents: Taxol.    ?  ?To help prevent nausea and vomiting after your treatment, we encourage you to take your nausea medication as directed. ? ?BELOW ARE SYMPTOMS THAT SHOULD BE REPORTED IMMEDIATELY: ?*FEVER GREATER THAN 100.4 F (38 ?C) OR HIGHER ?*CHILLS OR SWEATING ?*NAUSEA AND VOMITING THAT IS NOT CONTROLLED WITH YOUR NAUSEA MEDICATION ?*UNUSUAL SHORTNESS OF BREATH ?*UNUSUAL BRUISING OR BLEEDING ?*URINARY PROBLEMS (pain or burning when urinating, or frequent urination) ?*BOWEL PROBLEMS (unusual diarrhea, constipation, pain near the anus) ?TENDERNESS IN MOUTH AND THROAT WITH OR WITHOUT PRESENCE OF ULCERS (sore throat, sores in mouth, or a toothache) ?UNUSUAL RASH, SWELLING OR PAIN  ?UNUSUAL VAGINAL DISCHARGE OR ITCHING  ? ?Items with * indicate a potential emergency and should be followed up as soon as possible or go to the Emergency Department if any problems should occur. ? ?Please show the CHEMOTHERAPY ALERT CARD or IMMUNOTHERAPY ALERT CARD at check-in to the  Emergency Department and triage nurse. ? ?Should you have questions after your visit or need to cancel or reschedule your appointment, please contact [facility]  Dept: [phone]  and follow the prompts.  Office hours are 8:00 a.m. to 4:30 p.m. Monday - Friday. Please note that voicemails left after 4:00 p.m. may not be returned until the following business day.  We are closed weekends and major holidays. You have access to a nurse at all times for urgent questions. Please call the main number to the clinic Dept: [phone] and follow the prompts. ? ? ?For any non-urgent questions, you may also contact your provider using MyChart. We now offer e-Visits for anyone 76 and older to request care online for non-urgent symptoms. For details visit [email or web address]. ?  ?Also download the MyChart app! Go to the app store, search "MyChart", open the app, select [facility], and log in with your MyChart username and password. ? ?Due to Covid, a mask is required upon entering the hospital/clinic. If you do not have a mask, one will be given to you upon arrival. For doctor visits, patients may have 1 support person aged 60 or older with them. For treatment visits, patients cannot have anyone with them due to current Covid guidelines and our immunocompromised population.  ? ?

## 2021-11-25 NOTE — Progress Notes (Signed)
?[name]   ?Telephone:([phone] [phone]   ?Clinic Follow up Note  ? ?Patient Care Team: ?Billie Ruddy, MD as PCP - General (Family Medicine) ?Erroll Luna, MD as Consulting Physician (General Surgery) ?Truitt Merle, MD as Consulting Physician (Hematology) ?Gery Pray, MD as Consulting Physician (Radiation Oncology) ? ?Date of Service:  11/25/2021 ? ?CHIEF COMPLAINT: f/u of left breast cancer ? ?CURRENT THERAPY:  ?Neoadjuvant Carbo d1 and Taxol d1, 8, 15, q3weeks, starting 09/09/21 ?-Keytruda, q3weeks, starting 09/09/21 ? ?ASSESSMENT & PLAN:  ?Cheyenne [name] is a 41 y.o. pre-menopausal female with  ? ?1. Malignant neoplasm of lower-inner quadrant of left breast, Stage IIIA, c(T2, N1), Functionally triple negative, Grade 3 ?-presented with palpable left breast mass. B/l MM and left [place] on 08/07/21 showed: 2.8 cm left breast mass at 7:30; at least 4 abnormal lymph nodes. Biopsy that day confirmed IDC in breast but node was negative. ?-breast MRI on 08/26/21 and PET on 08/31/21 were negative aside from primary mass. ?-Her breast cancer is ER 2% positive, PR and HER2 negative, this is similar to triple negative breast cancer.   ?-port placed 09/08/21 ?-she began neoadjuvant carbo/taxol and keytruda on 09/09/20. This will be followed by Adriamycin and Cytoxan every 3 weeks for 4 cycles, along with immunotherapy Keytruda every 3 weeks for 1 year.  ?-she has been tolerating chemo well overall, required blood transfusion twice so far  ?-Labs reviewed, adequate for treatment, will proceed last dose Taxol today, followed by daily G-CSF injection for 3 days this week ?-She has had a good clinical response to chemo so far, the breast mass is not palpable anymore, plan to repeat breast MRI after neoadjuvant chemo.  She declined breast exam today. ?-She will return next week to start first cycle AC and Keytruda ? ?2. Anemia ?-secondary to chemo ?-she has required blood transfusion twice during  chemo, on 4/8 and 4/19 ?-hgb 8.4 today (11/25/21) ?  ?3. Social Support ?-she has good support from her wife. ?-she has a history of anxiety and is on Xanax as needed. I advised her to f/u with her counselor. ?  ? ?PLAN:  ?-Proceed with Cycle 4, Day 15 of taxol today ?-Plan to start Adriamycin and Cytoxan with Keytruda on 12/02/21 as scheduled.  ?-lab, flush, f/u, and AC/Keytruda in 4, 7, and 10 weeks ? ? ?No problem-specific Assessment & Plan notes found for this encounter. ? ? ?SUMMARY OF ONCOLOGIC HISTORY: ?Oncology History Overview Note  ? Cancer Staging  ?Malignant neoplasm of lower-inner quadrant of left breast in female, estrogen receptor positive ([place]) ?Staging form: Breast, AJCC 8th Edition ?- Clinical stage from 08/07/2021: Stage IIIA (cT2, cN1, cM0, G3, ER+, PR-, HER2-) - Signed by Truitt Merle, MD on 08/18/2021 ? ?  ?Malignant neoplasm of lower-inner quadrant of left breast in female, estrogen receptor positive ([place])  ?08/07/2021 Cancer Staging  ? Staging form: Breast, AJCC 8th Edition ?- Clinical stage from 08/07/2021: Stage IIB (cT2, cN0, cM0, G3, ER-, PR-, HER2-) - Signed by Truitt Merle, MD on 09/24/2021 ?Stage prefix: Initial diagnosis ?Histologic grading system: 3 grade system ? ?  ?08/07/2021 Mammogram  ? CLINICAL DATA:  Palpable mass in the LEFT breast since August. ?  ?EXAM: ?DIGITAL DIAGNOSTIC BILATERAL MAMMOGRAM WITH TOMOSYNTHESIS AND CAD; ?ULTRASOUND LEFT BREAST LIMITED ? ?IMPRESSION: ?1. Suspicious mass in the 7:30 o'clock location of the LEFT breast ?for which biopsy is recommended. ?2. LEFT axillary adenopathy, with at least 4 abnormal lymph nodes. ?  ?08/07/2021 Initial  Biopsy  ? Diagnosis ?1. Breast, left, needle core biopsy, 7:30, ribbon clip ?- INVASIVE DUCTAL CARCINOMA ?- SEE COMMENT ?2. Lymph node, needle/core biopsy, left axilla, coil clip ?- NO CARCINOMA IDENTIFIED ?Microscopic Comment ?1. based on the biopsy, the carcinoma appears Nottingham grade 3 of 3 and measures 1.1 cm in greatest linear  extent. ? ?1. PROGNOSTIC INDICATORS ?Results: ?The tumor cells are EQUIVOCAL for Her2 (2+). Her2 by FISH will be performed and results reported separately. ?Estrogen Receptor: 2%, POSITIVE, WEAK STAINING INTENSITY ?Progesterone Receptor: <1%, NEGATIVE ?Proliferation Marker Ki67: 40% ? ?1. FLUORESCENCE IN-SITU HYBRIDIZATION ?Results: ?GROUP 5: HER2 **NEGATIVE** ?  ?08/17/2021 Initial Diagnosis  ? Malignant neoplasm of lower-inner quadrant of left breast in female, estrogen receptor positive ([place]) ? ?  ?08/19/2021 Genetic Testing  ? [facility] CancerNext-Expanded Panel is Negative. Report date is 09/02/2021. ? ?The CancerNext-Expanded gene panel offered by [facility] and includes sequencing, rearrangement, and RNA analysis for the following 77 genes: AIP, ALK, APC, ATM, AXIN2, BAP1, BARD1, BLM, BMPR1A, BRCA1, BRCA2, BRIP1, CDC73, CDH1, CDK4, CDKN1B, CDKN2A, CHEK2, CTNNA1, DICER1, FANCC, FH, FLCN, GALNT12, KIF1B, LZTR1, MAX, MEN1, MET, MLH1, MSH2, MSH3, MSH6, MUTYH, NBN, NF1, NF2, NTHL1, PALB2, PHOX2B, PMS2, POT1, PRKAR1A, PTCH1, PTEN, RAD51C, RAD51D, RB1, RECQL, RET, SDHA, SDHAF2, SDHB, SDHC, SDHD, SMAD4, SMARCA4, SMARCB1, SMARCE1, STK11, SUFU, TMEM127, TP53, TSC1, TSC2, VHL and XRCC2 (sequencing and deletion/duplication); EGFR, EGLN1, HOXB13, KIT, MITF, PDGFRA, POLD1, and POLE (sequencing only); EPCAM and GREM1 (deletion/duplication only).  ?  ?08/26/2021 Imaging  ? EXAM: ?BILATERAL BREAST MRI WITH AND WITHOUT CONTRAST ? ?IMPRESSION: ?2.3 cm mass in the lower-inner quadrant of the left breast ?corresponding with the biopsy proven invasive ductal carcinoma. ?  ?08/31/2021 PET scan  ? IMPRESSION: ?1. Known mass medially in the left breast is markedly hypermetabolic consistent with breast cancer. ?2. No evidence of nodal metastases within the chest. No typical ?distant metastases. ?3. Suspected acute inflammatory process involving central mesentery and pelvis with ill-defined focal hypermetabolic activity anterior to the  sacrum. There are pelvic inflammatory changes and free pelvic fluid. Recent labs demonstrate mild leukocytosis. Differential considerations include acute appendicitis, enteritis and small perforated foreign body. No evidence of drainable fluid collection, pneumoperitoneum or bowel obstruction. ?4. These results were called by telephone at the time of ?interpretation on 08/31/2021 at 5:12 pm to on-call oncology nurse, ?[name], who verbally acknowledged these results. Unless there is a good clinical explanation for these findings, evaluation in the ?emergency department is likely warranted, and patient may benefit ?from abdominopelvic CT with oral and intravenous contrast. ?  ?09/01/2021 Imaging  ? EXAM: ?CT ABDOMEN AND PELVIS WITH CONTRAST ? ?IMPRESSION: ?Multiple thick-walled loops of distal small bowel, suggesting ?infectious/inflammatory enteritis. Associated small volume pelvic ?ascites. No pneumatosis or free air. ?  ?No evidence of bowel obstruction.  Normal appendix. ?  ?2.1 cm lesion in the medial left breast, likely corresponding to the ?patient's known primary breast neoplasm. No findings suspicious for metastatic disease. ?  ?09/09/2021 -  Chemotherapy  ? Patient is on Treatment Plan : BREAST Pembrolizumab (200) D1 + Carboplatin (5) D1 + Paclitaxel (80) D1,8,15 q21d X 4 cycles / Pembrolizumab (200) D1 + AC D1 q21d x 4 cycles  ? ?  ?  ? ? ? ?INTERVAL HISTORY:  ?Cheyenne [name] is here for a follow up of breast cancer. She was last seen by PA Murray Hodgkins on 11/18/21. She presents to the clinic accompanied by her wife. ?She reports she is doing okay overall. She notes she felt better  after the blood transfusion last week. ?Her wife notes some skin darkening in the patient's groin area and under-arms. ?  ?All other systems were reviewed with the patient and are negative. ? ?MEDICAL HISTORY:  ?Past Medical History:  ?Diagnosis Date  ? Anal fissure   ? Anxiety   ? Breast cancer ([place]) 08/10/21  ? Date diagnosis was given   ? DVT (deep venous thrombosis) ([place])   ? Incomplete right bundle branch block (RBBB) determined by electrocardiography 09/01/2021  ? ? ?SURGICAL HISTORY: ?Past Surgical History:  ?Procedure Laterality Date  ? NO PAST

## 2021-11-26 ENCOUNTER — Inpatient Hospital Stay: Payer: Federal, State, Local not specified - PPO

## 2021-11-26 VITALS — BP 123/78 | HR 86 | Temp 97.8°F | Resp 18

## 2021-11-26 DIAGNOSIS — Z5111 Encounter for antineoplastic chemotherapy: Secondary | ICD-10-CM | POA: Diagnosis not present

## 2021-11-26 DIAGNOSIS — Z170 Estrogen receptor positive status [ER+]: Secondary | ICD-10-CM

## 2021-11-26 DIAGNOSIS — D649 Anemia, unspecified: Secondary | ICD-10-CM | POA: Diagnosis not present

## 2021-11-26 DIAGNOSIS — Z79899 Other long term (current) drug therapy: Secondary | ICD-10-CM | POA: Diagnosis not present

## 2021-11-26 DIAGNOSIS — C50312 Malignant neoplasm of lower-inner quadrant of left female breast: Secondary | ICD-10-CM | POA: Diagnosis not present

## 2021-11-26 DIAGNOSIS — Z5112 Encounter for antineoplastic immunotherapy: Secondary | ICD-10-CM | POA: Diagnosis not present

## 2021-11-26 DIAGNOSIS — Z5189 Encounter for other specified aftercare: Secondary | ICD-10-CM | POA: Diagnosis not present

## 2021-11-26 MED ORDER — FILGRASTIM-AAFI 480 MCG/0.8ML IJ SOSY
480.0000 ug | PREFILLED_SYRINGE | Freq: Once | INTRAMUSCULAR | Status: AC
  Administered 2021-11-26: 480 ug via SUBCUTANEOUS
  Filled 2021-11-26: qty 0.8

## 2021-11-27 ENCOUNTER — Encounter: Payer: Self-pay | Admitting: Hematology

## 2021-11-27 ENCOUNTER — Other Ambulatory Visit: Payer: Self-pay

## 2021-11-27 ENCOUNTER — Inpatient Hospital Stay: Payer: Federal, State, Local not specified - PPO

## 2021-11-27 ENCOUNTER — Other Ambulatory Visit: Payer: Self-pay | Admitting: Nurse Practitioner

## 2021-11-27 VITALS — BP 120/72 | HR 99 | Temp 98.4°F | Resp 20

## 2021-11-27 DIAGNOSIS — C50312 Malignant neoplasm of lower-inner quadrant of left female breast: Secondary | ICD-10-CM

## 2021-11-27 DIAGNOSIS — Z5112 Encounter for antineoplastic immunotherapy: Secondary | ICD-10-CM | POA: Diagnosis not present

## 2021-11-27 DIAGNOSIS — Z79899 Other long term (current) drug therapy: Secondary | ICD-10-CM | POA: Diagnosis not present

## 2021-11-27 DIAGNOSIS — Z5111 Encounter for antineoplastic chemotherapy: Secondary | ICD-10-CM | POA: Diagnosis not present

## 2021-11-27 DIAGNOSIS — Z5189 Encounter for other specified aftercare: Secondary | ICD-10-CM | POA: Diagnosis not present

## 2021-11-27 DIAGNOSIS — D649 Anemia, unspecified: Secondary | ICD-10-CM | POA: Diagnosis not present

## 2021-11-27 MED ORDER — FILGRASTIM-AAFI 480 MCG/0.8ML IJ SOSY
480.0000 ug | PREFILLED_SYRINGE | Freq: Once | INTRAMUSCULAR | Status: AC
  Administered 2021-11-27: 480 ug via SUBCUTANEOUS
  Filled 2021-11-27: qty 0.8

## 2021-11-27 NOTE — Patient Instructions (Signed)
Filgrastim, G-CSF injection ?What is this medication? ?FILGRASTIM, G-CSF (fil GRA stim) is a granulocyte colony-stimulating factor that stimulates the growth of neutrophils, a type of white blood cell (WBC) important in the body's fight against infection. It is used to reduce the incidence of fever and infection in patients with certain types of cancer who are receiving chemotherapy that affects the bone marrow, to stimulate blood cell production for removal of WBCs from the body prior to a bone marrow transplantation, to reduce the incidence of fever and infection in patients who have severe chronic neutropenia, and to improve survival outcomes following high-dose radiation exposure that is toxic to the bone marrow. ?This medicine may be used for other purposes; ask your health care provider or pharmacist if you have questions. ?COMMON BRAND NAME(S): Neupogen, Nivestym, Releuko, Zarxio ?What should I tell my care team before I take this medication? ?They need to know if you have any of these conditions: ?kidney disease ?latex allergy ?ongoing radiation therapy ?sickle cell disease ?an unusual or allergic reaction to filgrastim, pegfilgrastim, other medicines, foods, dyes, or preservatives ?pregnant or trying to get pregnant ?breast-feeding ?How should I use this medication? ?This medicine is for injection under the skin or infusion into a vein. As an infusion into a vein, it is usually given by a health care professional in a hospital or clinic setting. If you get this medicine at home, you will be taught how to prepare and give this medicine. Refer to the Instructions for Use that come with your medication packaging. Use exactly as directed. Take your medicine at regular intervals. Do not take your medicine more often than directed. ?It is important that you put your used needles and syringes in a special sharps container. Do not put them in a trash can. If you do not have a sharps container, call your pharmacist  or healthcare provider to get one. ?Talk to your pediatrician regarding the use of this medicine in children. While this drug may be prescribed for children as young as 7 months for selected conditions, precautions do apply. ?Overdosage: If you think you have taken too much of this medicine contact a poison control center or emergency room at once. ?NOTE: This medicine is only for you. Do not share this medicine with others. ?What if I miss a dose? ?It is important not to miss your dose. Call your doctor or health care professional if you miss a dose. ?What may interact with this medication? ?This medicine may interact with the following medications: ?medicines that may cause a release of neutrophils, such as lithium ?This list may not describe all possible interactions. Give your health care provider a list of all the medicines, herbs, non-prescription drugs, or dietary supplements you use. Also tell them if you smoke, drink alcohol, or use illegal drugs. Some items may interact with your medicine. ?What should I watch for while using this medication? ?Your condition will be monitored carefully while you are receiving this medicine. ?You may need blood work done while you are taking this medicine. ?Talk to your health care provider about your risk of cancer. You may be more at risk for certain types of cancer if you take this medicine. ?What side effects may I notice from receiving this medication? ?Side effects that you should report to your doctor or health care professional as soon as possible: ?allergic reactions like skin rash, itching or hives, swelling of the face, lips, or tongue ?back pain ?dizziness or feeling faint ?fever ?pain, redness, or   irritation at site where injected ?pinpoint red spots on the skin ?shortness of breath or breathing problems ?signs and symptoms of kidney injury like trouble passing urine, change in the amount of urine, or red or dark-brown urine ?stomach or side pain, or pain at  the shoulder ?swelling ?tiredness ?unusual bleeding or bruising ?Side effects that usually do not require medical attention (report to your doctor or health care professional if they continue or are bothersome): ?bone pain ?cough ?diarrhea ?hair loss ?headache ?muscle pain ?This list may not describe all possible side effects. Call your doctor for medical advice about side effects. You may report side effects to FDA at 1-800-FDA-1088. ?Where should I keep my medication? ?Keep out of the reach of children. ?Store in a refrigerator between 2 and 8 degrees C (36 and 46 degrees F). Do not freeze. Keep in carton to protect from light. Throw away this medicine if vials or syringes are left out of the refrigerator for more than 24 hours. Throw away any unused medicine after the expiration date. ?NOTE: This sheet is a summary. It may not cover all possible information. If you have questions about this medicine, talk to your doctor, pharmacist, or health care provider. ?? 2023 Elsevier/Gold Standard (2021-06-19 00:00:00) ? ?

## 2021-11-28 ENCOUNTER — Inpatient Hospital Stay: Payer: Federal, State, Local not specified - PPO

## 2021-11-28 VITALS — BP 117/61 | HR 90 | Temp 97.7°F | Resp 17 | Ht 62.0 in

## 2021-11-28 DIAGNOSIS — Z5189 Encounter for other specified aftercare: Secondary | ICD-10-CM | POA: Diagnosis not present

## 2021-11-28 DIAGNOSIS — C50312 Malignant neoplasm of lower-inner quadrant of left female breast: Secondary | ICD-10-CM | POA: Diagnosis not present

## 2021-11-28 DIAGNOSIS — D649 Anemia, unspecified: Secondary | ICD-10-CM | POA: Diagnosis not present

## 2021-11-28 DIAGNOSIS — Z5111 Encounter for antineoplastic chemotherapy: Secondary | ICD-10-CM | POA: Diagnosis not present

## 2021-11-28 DIAGNOSIS — Z5112 Encounter for antineoplastic immunotherapy: Secondary | ICD-10-CM | POA: Diagnosis not present

## 2021-11-28 DIAGNOSIS — Z79899 Other long term (current) drug therapy: Secondary | ICD-10-CM | POA: Diagnosis not present

## 2021-11-28 DIAGNOSIS — Z170 Estrogen receptor positive status [ER+]: Secondary | ICD-10-CM

## 2021-11-28 MED ORDER — FILGRASTIM-AAFI 480 MCG/0.8ML IJ SOSY
480.0000 ug | PREFILLED_SYRINGE | Freq: Once | INTRAMUSCULAR | Status: AC
  Administered 2021-11-28: 480 ug via SUBCUTANEOUS

## 2021-11-28 NOTE — Patient Instructions (Signed)
Filgrastim, G-CSF injection ?What is this medication? ?FILGRASTIM, G-CSF (fil GRA stim) is a granulocyte colony-stimulating factor that stimulates the growth of neutrophils, a type of white blood cell (WBC) important in the body's fight against infection. It is used to reduce the incidence of fever and infection in patients with certain types of cancer who are receiving chemotherapy that affects the bone marrow, to stimulate blood cell production for removal of WBCs from the body prior to a bone marrow transplantation, to reduce the incidence of fever and infection in patients who have severe chronic neutropenia, and to improve survival outcomes following high-dose radiation exposure that is toxic to the bone marrow. ?This medicine may be used for other purposes; ask your health care provider or pharmacist if you have questions. ?COMMON BRAND NAME(S): Neupogen, Nivestym, Releuko, Zarxio ?What should I tell my care team before I take this medication? ?They need to know if you have any of these conditions: ?kidney disease ?latex allergy ?ongoing radiation therapy ?sickle cell disease ?an unusual or allergic reaction to filgrastim, pegfilgrastim, other medicines, foods, dyes, or preservatives ?pregnant or trying to get pregnant ?breast-feeding ?How should I use this medication? ?This medicine is for injection under the skin or infusion into a vein. As an infusion into a vein, it is usually given by a health care professional in a hospital or clinic setting. If you get this medicine at home, you will be taught how to prepare and give this medicine. Refer to the Instructions for Use that come with your medication packaging. Use exactly as directed. Take your medicine at regular intervals. Do not take your medicine more often than directed. ?It is important that you put your used needles and syringes in a special sharps container. Do not put them in a trash can. If you do not have a sharps container, call your pharmacist  or healthcare provider to get one. ?Talk to your pediatrician regarding the use of this medicine in children. While this drug may be prescribed for children as young as 7 months for selected conditions, precautions do apply. ?Overdosage: If you think you have taken too much of this medicine contact a poison control center or emergency room at once. ?NOTE: This medicine is only for you. Do not share this medicine with others. ?What if I miss a dose? ?It is important not to miss your dose. Call your doctor or health care professional if you miss a dose. ?What may interact with this medication? ?This medicine may interact with the following medications: ?medicines that may cause a release of neutrophils, such as lithium ?This list may not describe all possible interactions. Give your health care provider a list of all the medicines, herbs, non-prescription drugs, or dietary supplements you use. Also tell them if you smoke, drink alcohol, or use illegal drugs. Some items may interact with your medicine. ?What should I watch for while using this medication? ?Your condition will be monitored carefully while you are receiving this medicine. ?You may need blood work done while you are taking this medicine. ?Talk to your health care provider about your risk of cancer. You may be more at risk for certain types of cancer if you take this medicine. ?What side effects may I notice from receiving this medication? ?Side effects that you should report to your doctor or health care professional as soon as possible: ?allergic reactions like skin rash, itching or hives, swelling of the face, lips, or tongue ?back pain ?dizziness or feeling faint ?fever ?pain, redness, or   irritation at site where injected ?pinpoint red spots on the skin ?shortness of breath or breathing problems ?signs and symptoms of kidney injury like trouble passing urine, change in the amount of urine, or red or dark-brown urine ?stomach or side pain, or pain at  the shoulder ?swelling ?tiredness ?unusual bleeding or bruising ?Side effects that usually do not require medical attention (report to your doctor or health care professional if they continue or are bothersome): ?bone pain ?cough ?diarrhea ?hair loss ?headache ?muscle pain ?This list may not describe all possible side effects. Call your doctor for medical advice about side effects. You may report side effects to FDA at 1-800-FDA-1088. ?Where should I keep my medication? ?Keep out of the reach of children. ?Store in a refrigerator between 2 and 8 degrees C (36 and 46 degrees F). Do not freeze. Keep in carton to protect from light. Throw away this medicine if vials or syringes are left out of the refrigerator for more than 24 hours. Throw away any unused medicine after the expiration date. ?NOTE: This sheet is a summary. It may not cover all possible information. If you have questions about this medicine, talk to your doctor, pharmacist, or health care provider. ?? 2023 Elsevier/Gold Standard (2021-06-19 00:00:00) ? ?

## 2021-12-01 ENCOUNTER — Encounter: Payer: Self-pay | Admitting: Hematology

## 2021-12-01 ENCOUNTER — Telehealth: Payer: Self-pay | Admitting: Hematology

## 2021-12-01 ENCOUNTER — Inpatient Hospital Stay: Payer: Federal, State, Local not specified - PPO

## 2021-12-01 ENCOUNTER — Other Ambulatory Visit: Payer: Self-pay

## 2021-12-01 ENCOUNTER — Inpatient Hospital Stay: Payer: Federal, State, Local not specified - PPO | Attending: Hematology | Admitting: Hematology

## 2021-12-01 ENCOUNTER — Ambulatory Visit: Payer: Federal, State, Local not specified - PPO

## 2021-12-01 VITALS — BP 124/77 | HR 107 | Temp 98.5°F | Resp 18 | Ht 62.0 in | Wt 204.9 lb

## 2021-12-01 DIAGNOSIS — Z5111 Encounter for antineoplastic chemotherapy: Secondary | ICD-10-CM | POA: Diagnosis not present

## 2021-12-01 DIAGNOSIS — Z5189 Encounter for other specified aftercare: Secondary | ICD-10-CM | POA: Diagnosis not present

## 2021-12-01 DIAGNOSIS — Z170 Estrogen receptor positive status [ER+]: Secondary | ICD-10-CM | POA: Diagnosis not present

## 2021-12-01 DIAGNOSIS — D649 Anemia, unspecified: Secondary | ICD-10-CM | POA: Diagnosis not present

## 2021-12-01 DIAGNOSIS — C50312 Malignant neoplasm of lower-inner quadrant of left female breast: Secondary | ICD-10-CM | POA: Insufficient documentation

## 2021-12-01 DIAGNOSIS — Z5112 Encounter for antineoplastic immunotherapy: Secondary | ICD-10-CM | POA: Insufficient documentation

## 2021-12-01 DIAGNOSIS — Z79899 Other long term (current) drug therapy: Secondary | ICD-10-CM | POA: Insufficient documentation

## 2021-12-01 LAB — CMP (CANCER CENTER ONLY)
ALT: 43 U/L (ref 0–44)
AST: 30 U/L (ref 15–41)
Albumin: 4.2 g/dL (ref 3.5–5.0)
Alkaline Phosphatase: 104 U/L (ref 38–126)
Anion gap: 9 (ref 5–15)
BUN: 12 mg/dL (ref 6–20)
CO2: 28 mmol/L (ref 22–32)
Calcium: 9.5 mg/dL (ref 8.9–10.3)
Chloride: 103 mmol/L (ref 98–111)
Creatinine: 0.94 mg/dL (ref 0.44–1.00)
GFR, Estimated: >60 mL/min (ref >60)
Glucose, Bld: 120 mg/dL — ABNORMAL HIGH (ref 70–99)
Potassium: 4.1 mmol/L (ref 3.5–5.1)
Sodium: 140 mmol/L (ref 135–145)
Total Bilirubin: 0.4 mg/dL (ref 0.3–1.2)
Total Protein: 7.8 g/dL (ref 6.5–8.1)

## 2021-12-01 LAB — CBC WITH DIFFERENTIAL (CANCER CENTER ONLY)
Abs Immature Granulocytes: 0.15 10*3/uL — ABNORMAL HIGH (ref 0.00–0.07)
Basophils Absolute: 0.1 10*3/uL (ref 0.0–0.1)
Basophils Relative: 1 %
Eosinophils Absolute: 0 10*3/uL (ref 0.0–0.5)
Eosinophils Relative: 1 %
HCT: 25.2 % — ABNORMAL LOW (ref 36.0–46.0)
Hemoglobin: 8.8 g/dL — ABNORMAL LOW (ref 12.0–15.0)
Immature Granulocytes: 2 %
Lymphocytes Relative: 44 %
Lymphs Abs: 2.9 10*3/uL (ref 0.7–4.0)
MCH: 32.1 pg (ref 26.0–34.0)
MCHC: 34.9 g/dL (ref 30.0–36.0)
MCV: 92 fL (ref 80.0–100.0)
Monocytes Absolute: 0.9 10*3/uL (ref 0.1–1.0)
Monocytes Relative: 14 %
Neutro Abs: 2.5 10*3/uL (ref 1.7–7.7)
Neutrophils Relative %: 38 %
Platelet Count: 141 10*3/uL — ABNORMAL LOW (ref 150–400)
RBC: 2.74 MIL/uL — ABNORMAL LOW (ref 3.87–5.11)
RDW: 18.6 % — ABNORMAL HIGH (ref 11.5–15.5)
WBC Count: 6.5 10*3/uL (ref 4.0–10.5)
nRBC: 0 % (ref 0.0–0.2)

## 2021-12-01 NOTE — Telephone Encounter (Signed)
.  Called patient to schedule appointment per 5/2 inbasket, patient is aware of date and time.   ?

## 2021-12-01 NOTE — Progress Notes (Signed)
?[place]   ?Telephone:(336) [phone] [phone]   ?Clinic Follow up Note  ? ?Patient Care Team: ?Billie Ruddy, MD as PCP - General (Family Medicine) ?Erroll Luna, MD as Consulting Physician (General Surgery) ?Truitt Merle, MD as Consulting Physician (Hematology) ?Gery Pray, MD as Consulting Physician (Radiation Oncology) ? ?Date of Service:  12/01/2021 ? ?CHIEF COMPLAINT: f/u of left breast cancer ? ?CURRENT THERAPY:  ?Neoadjuvant Adriamycin and Cytoxan, q3weeks, starting 12/02/21 ?-Keytruda, q3weeks, starting 09/09/21 ? ?ASSESSMENT & PLAN:  ?Cheyenne [name] is a 41 y.o. pre-menopausal female with  ? ?1. Malignant neoplasm of lower-inner quadrant of left breast, Stage IIIA, c(T2, N1), Functionally triple negative, Grade 3 ?-presented with palpable left breast mass. B/l MM and left [place] on 08/07/21 showed: 2.8 cm left breast mass at 7:30; at least 4 abnormal lymph nodes. Biopsy that day confirmed IDC in breast but node was negative. ?-breast MRI on 08/26/21 and PET on 08/31/21 were negative aside from primary mass. ?-Her breast cancer is ER 2% positive, PR and HER2 negative, this is similar to triple negative breast cancer.   ?-port placed 09/08/21 ?-she began neoadjuvant carbo/taxol and keytruda on 09/09/20. This will be followed by Adriamycin and Cytoxan every 3 weeks for 4 cycles, along with immunotherapy Keytruda every 3 weeks for 1 year. She completed Carbo/Taxol on 11/25/21; her breast mass became no longer palpable after cycle 3. ?-she has been tolerating chemo well overall, required blood transfusion twice so far  ?-she is now ready to move to [facility], with continuing [place]. Labs reviewed, overall stable and adequate to proceed with first [facility] tomorrow. Physical exam continues to show good response; there are currently no palpable masses. ?  ?2. Anemia ?-secondary to chemo ?-she has required blood transfusion twice during chemo, on 4/8 and 4/19 ?-hgb 8.8 today (12/01/21) ?  ?3. Social  Support ?-she has good support from her wife. ?-she has a history of anxiety and is on Xanax as needed. I advised her to f/u with her counselor. ?  ?  ?PLAN:  ?-Proceed with Cycle 1 Adriamycin and Cytoxan with Keytruda tomorrow, 12/02/21, as scheduled.  ? -we will schedule GCF-S for Friday, 5/5 ?-lab, flush, f/u, and AC/Keytruda in 3, 6, and 9 weeks ?-I will write a letter for her work, per her request  ? ? ?No problem-specific Assessment & Plan notes found for this encounter. ? ? ?SUMMARY OF ONCOLOGIC HISTORY: ?Oncology History Overview Note  ? Cancer Staging  ?Malignant neoplasm of lower-inner quadrant of left breast in female, estrogen receptor positive ([place]) ?Staging form: Breast, AJCC 8th Edition ?- Clinical stage from 08/07/2021: Stage IIIA (cT2, cN1, cM0, G3, ER+, PR-, HER2-) - Signed by Truitt Merle, MD on 08/18/2021 ? ?  ?Malignant neoplasm of lower-inner quadrant of left breast in female, estrogen receptor positive ([place])  ?08/07/2021 Cancer Staging  ? Staging form: Breast, AJCC 8th Edition ?- Clinical stage from 08/07/2021: Stage IIB (cT2, cN0, cM0, G3, ER-, PR-, HER2-) - Signed by Truitt Merle, MD on 09/24/2021 ?Stage prefix: Initial diagnosis ?Histologic grading system: 3 grade system ? ?  ?08/07/2021 Mammogram  ? CLINICAL DATA:  Palpable mass in the LEFT breast since August. ?  ?EXAM: ?DIGITAL DIAGNOSTIC BILATERAL MAMMOGRAM WITH TOMOSYNTHESIS AND CAD; ?ULTRASOUND LEFT BREAST LIMITED ? ?IMPRESSION: ?1. Suspicious mass in the 7:30 o'clock location of the LEFT breast ?for which biopsy is recommended. ?2. LEFT axillary adenopathy, with at least 4 abnormal lymph nodes. ?  ?08/07/2021 Initial Biopsy  ? Diagnosis ?1.  Breast, left, needle core biopsy, 7:30, ribbon clip ?- INVASIVE DUCTAL CARCINOMA ?- SEE COMMENT ?2. Lymph node, needle/core biopsy, left axilla, coil clip ?- NO CARCINOMA IDENTIFIED ?Microscopic Comment ?1. based on the biopsy, the carcinoma appears Nottingham grade 3 of 3 and measures 1.1 cm in greatest linear  extent. ? ?1. PROGNOSTIC INDICATORS ?Results: ?The tumor cells are EQUIVOCAL for Her2 (2+). Her2 by FISH will be performed and results reported separately. ?Estrogen Receptor: 2%, POSITIVE, WEAK STAINING INTENSITY ?Progesterone Receptor: <1%, NEGATIVE ?Proliferation Marker Ki67: 40% ? ?1. FLUORESCENCE IN-SITU HYBRIDIZATION ?Results: ?GROUP 5: HER2 **NEGATIVE** ?  ?08/17/2021 Initial Diagnosis  ? Malignant neoplasm of lower-inner quadrant of left breast in female, estrogen receptor positive ([place]) ? ?  ?08/19/2021 Genetic Testing  ? [facility] CancerNext-Expanded Panel is Negative. Report date is 09/02/2021. ? ?The CancerNext-Expanded gene panel offered by [facility] and includes sequencing, rearrangement, and RNA analysis for the following 77 genes: AIP, ALK, APC, ATM, AXIN2, BAP1, BARD1, BLM, BMPR1A, BRCA1, BRCA2, BRIP1, CDC73, CDH1, CDK4, CDKN1B, CDKN2A, CHEK2, CTNNA1, DICER1, FANCC, FH, FLCN, GALNT12, KIF1B, LZTR1, MAX, MEN1, MET, MLH1, MSH2, MSH3, MSH6, MUTYH, NBN, NF1, NF2, NTHL1, PALB2, PHOX2B, PMS2, POT1, PRKAR1A, PTCH1, PTEN, RAD51C, RAD51D, RB1, RECQL, RET, SDHA, SDHAF2, SDHB, SDHC, SDHD, SMAD4, SMARCA4, SMARCB1, SMARCE1, STK11, SUFU, TMEM127, TP53, TSC1, TSC2, VHL and XRCC2 (sequencing and deletion/duplication); EGFR, EGLN1, HOXB13, KIT, MITF, PDGFRA, POLD1, and POLE (sequencing only); EPCAM and GREM1 (deletion/duplication only).  ?  ?08/26/2021 Imaging  ? EXAM: ?BILATERAL BREAST MRI WITH AND WITHOUT CONTRAST ? ?IMPRESSION: ?2.3 cm mass in the lower-inner quadrant of the left breast ?corresponding with the biopsy proven invasive ductal carcinoma. ?  ?08/31/2021 PET scan  ? IMPRESSION: ?1. Known mass medially in the left breast is markedly hypermetabolic consistent with breast cancer. ?2. No evidence of nodal metastases within the chest. No typical ?distant metastases. ?3. Suspected acute inflammatory process involving central mesentery and pelvis with ill-defined focal hypermetabolic activity anterior to the  sacrum. There are pelvic inflammatory changes and free pelvic fluid. Recent labs demonstrate mild leukocytosis. Differential considerations include acute appendicitis, enteritis and small perforated foreign body. No evidence of drainable fluid collection, pneumoperitoneum or bowel obstruction. ?4. These results were called by telephone at the time of ?interpretation on 08/31/2021 at 5:12 pm to on-call oncology nurse, ?[name], who verbally acknowledged these results. Unless there is a good clinical explanation for these findings, evaluation in the ?emergency department is likely warranted, and patient may benefit ?from abdominopelvic CT with oral and intravenous contrast. ?  ?09/01/2021 Imaging  ? EXAM: ?CT ABDOMEN AND PELVIS WITH CONTRAST ? ?IMPRESSION: ?Multiple thick-walled loops of distal small bowel, suggesting ?infectious/inflammatory enteritis. Associated small volume pelvic ?ascites. No pneumatosis or free air. ?  ?No evidence of bowel obstruction.  Normal appendix. ?  ?2.1 cm lesion in the medial left breast, likely corresponding to the ?patient's known primary breast neoplasm. No findings suspicious for metastatic disease. ?  ?09/09/2021 -  Chemotherapy  ? Patient is on Treatment Plan : BREAST Pembrolizumab (200) D1 + Carboplatin (5) D1 + Paclitaxel (80) D1,8,15 q21d X 4 cycles / Pembrolizumab (200) D1 + AC D1 q21d x 4 cycles  ? ?  ?  ? ? ? ?INTERVAL HISTORY:  ?Cheyenne [name] is here for a follow up of breast cancer. She was last seen by me on 11/25/21. She presents to the clinic accompanied by her wife. ?She reports she did well with her final cycle taxol. She reports intermittent tenderness to her axilla. ?She  also reports what feels like cuts in her mouth; she denies mouth sores. She notes these are irritated by salty foods. ?  ?All other systems were reviewed with the patient and are negative. ? ?MEDICAL HISTORY:  ?Past Medical History:  ?Diagnosis Date  ? Anal fissure   ? Anxiety   ? Breast cancer  ([place]) 08/10/21  ? Date diagnosis was given  ? DVT (deep venous thrombosis) ([place])   ? Incomplete right bundle branch block (RBBB) determined by electrocardiography 09/01/2021  ? ? ?SURGICAL HISTORY: ?Past Surgical History:  ?

## 2021-12-02 ENCOUNTER — Other Ambulatory Visit: Payer: Self-pay

## 2021-12-02 ENCOUNTER — Emergency Department (HOSPITAL_COMMUNITY)
Admission: EM | Admit: 2021-12-02 | Discharge: 2021-12-03 | Disposition: A | Discharge status: Home / Self Care | Payer: Federal, State, Local not specified - PPO | Attending: Emergency Medicine | Admitting: Emergency Medicine

## 2021-12-02 ENCOUNTER — Inpatient Hospital Stay: Payer: Federal, State, Local not specified - PPO

## 2021-12-02 ENCOUNTER — Encounter: Payer: Self-pay | Admitting: *Deleted

## 2021-12-02 ENCOUNTER — Encounter (HOSPITAL_COMMUNITY): Payer: Self-pay | Admitting: Emergency Medicine

## 2021-12-02 VITALS — BP 157/89 | HR 96 | Temp 98.0°F | Resp 18

## 2021-12-02 DIAGNOSIS — Z853 Personal history of malignant neoplasm of breast: Secondary | ICD-10-CM | POA: Insufficient documentation

## 2021-12-02 DIAGNOSIS — T782XXA Anaphylactic shock, unspecified, initial encounter: Secondary | ICD-10-CM | POA: Diagnosis not present

## 2021-12-02 DIAGNOSIS — C50312 Malignant neoplasm of lower-inner quadrant of left female breast: Secondary | ICD-10-CM

## 2021-12-02 DIAGNOSIS — R110 Nausea: Secondary | ICD-10-CM | POA: Insufficient documentation

## 2021-12-02 DIAGNOSIS — L539 Erythematous condition, unspecified: Secondary | ICD-10-CM | POA: Insufficient documentation

## 2021-12-02 DIAGNOSIS — R221 Localized swelling, mass and lump, neck: Secondary | ICD-10-CM | POA: Diagnosis not present

## 2021-12-02 DIAGNOSIS — R21 Rash and other nonspecific skin eruption: Secondary | ICD-10-CM | POA: Diagnosis not present

## 2021-12-02 MED ORDER — HEPARIN SOD (PORK) LOCK FLUSH 100 UNIT/ML IV SOLN
500.0000 [IU] | Freq: Once | INTRAVENOUS | Status: AC | PRN
  Administered 2021-12-02: 500 [IU]

## 2021-12-02 MED ORDER — METHYLPREDNISOLONE SODIUM SUCC 125 MG IJ SOLR
125.0000 mg | Freq: Once | INTRAMUSCULAR | Status: AC
  Administered 2021-12-02: 125 mg via INTRAVENOUS
  Filled 2021-12-02: qty 2

## 2021-12-02 MED ORDER — SODIUM CHLORIDE 0.9 % IV SOLN
150.0000 mg | Freq: Once | INTRAVENOUS | Status: AC
  Administered 2021-12-02: 150 mg via INTRAVENOUS
  Filled 2021-12-02: qty 150

## 2021-12-02 MED ORDER — DIPHENHYDRAMINE HCL 50 MG/ML IJ SOLN
50.0000 mg | Freq: Once | INTRAMUSCULAR | Status: AC
  Administered 2021-12-02: 50 mg via INTRAVENOUS
  Filled 2021-12-02: qty 1

## 2021-12-02 MED ORDER — EPINEPHRINE 0.3 MG/0.3ML IJ SOAJ
INTRAMUSCULAR | Status: AC
  Filled 2021-12-02: qty 0.3

## 2021-12-02 MED ORDER — SODIUM CHLORIDE 0.9% FLUSH
10.0000 mL | INTRAVENOUS | Status: DC | PRN
  Administered 2021-12-02: 10 mL

## 2021-12-02 MED ORDER — PREDNISONE 20 MG PO TABS: 40.0000 mg | ORAL_TABLET | Freq: Every day | ORAL | 0 refills | Status: AC

## 2021-12-02 MED ORDER — FAMOTIDINE IN NACL 20-0.9 MG/50ML-% IV SOLN
20.0000 mg | Freq: Once | INTRAVENOUS | Status: AC
  Administered 2021-12-02: 20 mg via INTRAVENOUS
  Filled 2021-12-02: qty 50

## 2021-12-02 MED ORDER — EPINEPHRINE 0.3 MG/0.3ML IJ SOAJ
0.3000 mg | INTRAMUSCULAR | 0 refills | Status: AC | PRN
Start: 2021-12-02 — End: ?

## 2021-12-02 MED ORDER — SODIUM CHLORIDE 0.9 % IV SOLN
600.0000 mg/m2 | Freq: Once | INTRAVENOUS | Status: AC
  Administered 2021-12-02: 1220 mg via INTRAVENOUS
  Filled 2021-12-02: qty 61

## 2021-12-02 MED ORDER — SODIUM CHLORIDE 0.9 % IV SOLN: Freq: Once | INTRAVENOUS | Status: AC

## 2021-12-02 MED ORDER — DOXORUBICIN HCL CHEMO IV INJECTION 2 MG/ML
60.0000 mg/m2 | Freq: Once | INTRAVENOUS | Status: AC
  Administered 2021-12-02: 122 mg via INTRAVENOUS
  Filled 2021-12-02: qty 61

## 2021-12-02 MED ORDER — SODIUM CHLORIDE 0.9 % IV SOLN
200.0000 mg | Freq: Once | INTRAVENOUS | Status: AC
  Administered 2021-12-02: 200 mg via INTRAVENOUS
  Filled 2021-12-02: qty 200

## 2021-12-02 MED ORDER — EPINEPHRINE 0.3 MG/0.3ML IJ SOAJ
0.3000 mg | Freq: Once | INTRAMUSCULAR | Status: AC
  Administered 2021-12-02: 0.3 mg via INTRAMUSCULAR

## 2021-12-02 MED ORDER — PALONOSETRON HCL INJECTION 0.25 MG/5ML
0.2500 mg | Freq: Once | INTRAVENOUS | Status: AC
  Administered 2021-12-02: 0.25 mg via INTRAVENOUS
  Filled 2021-12-02: qty 5

## 2021-12-02 NOTE — Discharge Instructions (Signed)
Your history, exam, evaluation today are concerning for anaphylactic reaction.  Given the only new exposure medication was the new chemotherapy this morning, I do suspect this may have contributed to your symptoms.  Your exam revealed some facial swelling, tongue swelling, and evidence of posterior throat swelling with some erythema and rash to the face.  After epinephrine your symptoms have improved.  You did not have evidence of any thrush in your mouth and your exam was otherwise reassuring.  Given your improving symptoms and stability we do feel you are safe for discharge home but please fill the prescription for the steroids for the next 4 days and EpiPen prescription.  Please follow-up with your oncology team and PCP for further allergy testing and further planning for your outpatient management. ? ?We also discussed getting labs and imaging of your neck however I suspect your swelling is related to allergic reaction as opposed to some deeper abnormality or infection.  If your symptoms are to change or worsen, please consider returning to the nearest emergency department for further evaluation and management. ?

## 2021-12-02 NOTE — ED Provider Triage Note (Signed)
Emergency Medicine Provider Triage Evaluation Note ? ?Cheyenne [name] , a 41 y.o. female  was evaluated in triage.  Pt complains of possible allergic reaction. Pt received new chemo therapy today around 9 AM. She went home and slept all day. About 1 hour ago she woke up with feeling of throat swelling, difficulty breathing. She thought she may have had a rash to her R neck however family member at bedside did not notice a rash. She did not take any benadryl prior to arrival.  ? ?Review of Systems  ?Positive: + throat swelling, difficulty breathing ?Negative: - rash ? ?Physical Exam  ?BP (!) 152/77 (BP Location: Right Arm)   Pulse (!) 115   Temp 99 ?F (37.2 ?C) (Oral)   Resp 18   SpO2 98%  ?Gen:   Awake, no distress   ?Resp:  Normal effort  ?MSK:   Moves extremities without difficulty  ?Other:  Speaking in full sentences. LCTAB without wheezing. No rash. Posterior oropharynx clear.  ? ?Medical Decision Making  ?Medically screening exam initiated at 8:31 PM.  Appropriate orders placed.  Cheyenne [name] was informed that the remainder of the evaluation will be completed by another provider, this initial triage assessment does not replace that evaluation, and the importance of remaining in the ED until their evaluation is complete. ? ? ?  ?Eustaquio Maize, PA-C ?12/02/21 2034 ? ?

## 2021-12-02 NOTE — ED Provider Notes (Signed)
?[facility] DEPT ?Provider Note ? ? ?CSN: [id number] ?Arrival date & time: 12/02/21  2013 ? ?  ? ?History ? ?Chief Complaint  ?Patient presents with  ? neck swelling  ? ? ?Cheyenne [name] is a 41 y.o. female. ? ?The history is provided by the patient, a relative and medical records. No language interpreter was used.  ?Allergic Reaction ?Presenting symptoms: rash and swelling   ?Presenting symptoms: no difficulty swallowing and no wheezing   ?Severity:  Moderate ?Duration:  1 hour ?Prior allergic episodes:  No prior episodes ?Context: medications   ?Relieved by:  Nothing ?Worsened by:  Nothing ?Ineffective treatments:  None tried ? ?  ? ?Home Medications ?Prior to Admission medications   ?Medication Sig Start Date End Date Taking? Authorizing Provider  ?acetaminophen (TYLENOL) 500 MG tablet Take 500-1,000 mg by mouth every 6 (six) hours as needed (pain.).    [provider]  ?Matilde Bash 1/35 tablet Take 1 tablet by mouth in the morning. 02/13/18   [provider]  ?albuterol (VENTOLIN HFA) 108 (90 Base) MCG/ACT inhaler Inhale 2 puffs into the lungs every 4 (four) hours as needed for wheezing. 03/12/21   Billie Ruddy, MD  ?ALPRAZolam Duanne Moron) 0.5 MG tablet Take one tablet twice daily as needed for anxiety. 09/04/21   Mozingo, Berdie Ogren, NP  ?cyclobenzaprine (FLEXERIL) 10 MG tablet Take 10 mg by mouth daily as needed for muscle spasms.    [provider]  ?desonide (DESOWEN) 0.05 % cream Apply topically 2 (two) times daily. ?Patient taking differently: Apply 1 application topically 2 (two) times daily as needed (skin irritation.). 06/12/18   McVey, Gelene Mink, PA-C  ?desonide (DESOWEN) 0.05 % lotion Apply 1 application topically daily.    [provider]  ?hydroquinone 4 % cream APPLY TO DARK SPOTS AT BEDTIME ?Patient taking differently: Apply 1 application topically daily as needed (dark spots). APPLY TO DARK SPOTS AT BEDTIME 03/12/21   Billie Ruddy, MD  ?ibuprofen (ADVIL) 800 MG tablet Take 800 mg by mouth every 8 (eight) hours as needed for moderate pain.    [provider]  ?ibuprofen (ADVIL) 800 MG tablet Take 1 tablet (800 mg total) by mouth every 8 (eight) hours as needed. 09/08/21   Erroll Luna, MD  ?KLOR-CON M20 20 MEQ tablet TAKE 1 TABLET BY MOUTH TWICE A DAY 11/27/21   Alla Feeling, NP  ?lidocaine-prilocaine (EMLA) cream Apply to affected area once 08/19/21   Truitt Merle, MD  ?loratadine (CLARITIN) 10 MG tablet Take 10 mg by mouth daily.    [provider]  ?Multiple Vitamins-Minerals (ONE A DAY IMMUNITY DEFENSE) CHEW Chew 1 tablet by mouth daily.    [provider]  ?ondansetron (ZOFRAN) 8 MG tablet Take 1 tablet (8 mg total) by mouth 2 (two) times daily as needed. Start on the third day after carboplatin and AC chemotherapy. 08/19/21   Truitt Merle, MD  ?oxyCODONE (OXY IR/ROXICODONE) 5 MG immediate release tablet Take 1 tablet (5 mg total) by mouth every 6 (six) hours as needed for severe pain. 09/08/21   Erroll Luna, MD  ?prochlorperazine (COMPAZINE) 10 MG tablet Take 1 tablet (10 mg total) by mouth every 6 (six) hours as needed (Nausea or vomiting). 08/19/21   Truitt Merle, MD  ?sulfamethoxazole-trimethoprim (BACTRIM DS) 800-160 MG tablet Take 1 tablet by mouth 2 (two) times daily. 10/12/21   [provider]  ?traMADol (ULTRAM) 50 MG tablet Take 1 tablet (50 mg total) by mouth  every 6 (six) hours as needed. 09/02/21   Truitt Merle, MD  ?   ? ?Allergies    ?Patient has no known allergies.   ? ?Review of Systems   ?Review of Systems  ?Constitutional:  Negative for chills, diaphoresis, fatigue and fever.  ?HENT:  Positive for facial swelling. Negative for congestion and trouble swallowing.   ?Eyes:  Negative for visual disturbance.  ?Respiratory:  Negative for cough, chest tightness, shortness of breath and wheezing.   ?Cardiovascular:  Negative for chest pain and leg swelling.  ?Gastrointestinal:  Positive for  nausea. Negative for abdominal pain, constipation, diarrhea and vomiting.  ?Genitourinary:  Negative for dysuria and frequency.  ?Musculoskeletal:  Negative for back pain, neck pain and neck stiffness.  ?Skin:  Positive for rash. Negative for wound.  ?Neurological:  Negative for light-headedness and headaches.  ?Psychiatric/Behavioral:  Negative for agitation and confusion.   ?All other systems reviewed and are negative. ? ?Physical Exam ?Updated Vital Signs ?BP 128/87 (BP Location: Right Arm)   Pulse (!) 108   Temp 98.6 ?F (37 ?C) (Oral)   Resp 16   SpO2 99%  ?Physical Exam ?Vitals and nursing note reviewed.  ?Constitutional:   ?   General: She is not in acute distress. ?   Appearance: She is well-developed. She is not ill-appearing, toxic-appearing or diaphoretic.  ?HENT:  ?   Head: Atraumatic.  ? ?   Comments: Mild lip swelling, tongue swelling, and possible oropharyngeal swelling.  No stridor appreciated.  Some redness on the face.  Patient reported some mild tenderness on her bilateral neck but otherwise chest nontender.  Dry mucous membranes in the mouth.  Do not see evidence of thrush at this time.  Uvula midline.  Normal neck range of motion without tenderness to the back of the neck. ?   Nose: No congestion or rhinorrhea.  ?   Mouth/Throat:  ?   Mouth: Mucous membranes are dry.  ?   Pharynx: No oropharyngeal exudate or posterior oropharyngeal erythema.  ?Eyes:  ?   Extraocular Movements: Extraocular movements intact.  ?   Conjunctiva/sclera: Conjunctivae normal.  ?   Pupils: Pupils are equal, round, and reactive to light.  ?Cardiovascular:  ?   Rate and Rhythm: Normal rate and regular rhythm.  ?   Heart sounds: No murmur heard. ?Pulmonary:  ?   Effort: Pulmonary effort is normal. No respiratory distress.  ?   Breath sounds: Normal breath sounds. No wheezing, rhonchi or rales.  ?Chest:  ?   Chest wall: No tenderness.  ?Abdominal:  ?   General: Abdomen is flat.  ?   Palpations: Abdomen is soft.  ?    Tenderness: There is no abdominal tenderness. There is no guarding or rebound.  ?Musculoskeletal:     ?   General: No swelling or tenderness.  ?   Cervical back: Neck supple.  ?   Right lower leg: No edema.  ?   Left lower leg: No edema.  ?Skin: ?   General: Skin is warm and dry.  ?   Capillary Refill: Capillary refill takes less than 2 seconds.  ?   Findings: Erythema present.  ?Neurological:  ?   General: No focal deficit present.  ?   Mental Status: She is alert.  ?Psychiatric:     ?   Mood and Affect: Mood normal.  ? ? ?ED Results / Procedures / Treatments   ?Labs ?(all labs ordered are listed, but only abnormal results are displayed) ?Labs  Reviewed - No data to display ? ?EKG ?None ? ?Radiology ?No results found. ? ?Procedures ?Procedures  ? ? ?CRITICAL CARE ?Performed by: Gwenyth Allegra Timea Breed ?Total critical care time: 30 minutes ?Critical care time was exclusive of separately billable procedures and treating other patients. ?Critical care was necessary to treat or prevent imminent or life-threatening deterioration. ?Critical care was time spent personally by me on the following activities: development of treatment plan with patient and/or surrogate as well as nursing, discussions with consultants, evaluation of patient's response to treatment, examination of patient, obtaining history from patient or surrogate, ordering and performing treatments and interventions, ordering and review of laboratory studies, ordering and review of radiographic studies, pulse oximetry and re-evaluation of patient's condition. ? ? ?Medications Ordered in ED ?Medications  ?methylPREDNISolone sodium succinate (SOLU-MEDROL) 125 mg/2 mL injection 125 mg (has no administration in time range)  ?diphenhydrAMINE (BENADRYL) injection 50 mg (has no administration in time range)  ?famotidine (PEPCID) IVPB 20 mg premix (has no administration in time range)  ? ? ?ED Course/ Medical Decision Making/ A&P ?  ?                        ?Medical  Decision Making ?Risk ?Prescription drug management. ? ? ? ?Cheyenne [name] is a 41 y.o. female with a past medical history significant for breast cancer, anxiety, and previous DVT who presents with concern for a

## 2021-12-02 NOTE — Patient Instructions (Signed)
[facility]  Discharge Instructions: ?Thank you for choosing [facility] to provide your oncology and hematology care.  ? ?If you have a lab appointment with the [facility], please go directly to the [facility] and check in at the registration area. ?  ?Wear comfortable clothing and clothing appropriate for easy access to any Portacath or PICC line.  ? ?We strive to give you quality time with your provider. You may need to reschedule your appointment if you arrive late (15 or more minutes).  Arriving late affects you and other patients whose appointments are after yours.  Also, if you miss three or more appointments without notifying the office, you may be dismissed from the clinic at the provider?s discretion.    ?  ?For prescription refill requests, have your pharmacy contact our office and allow 72 hours for refills to be completed.   ? ?Today you received the following chemotherapy and/or immunotherapy agents: Keytruda, Doxorubicin, Cytoxan.     ?  ?To help prevent nausea and vomiting after your treatment, we encourage you to take your nausea medication as directed. ? ?BELOW ARE SYMPTOMS THAT SHOULD BE REPORTED IMMEDIATELY: ?*FEVER GREATER THAN 100.4 F (38 ?C) OR HIGHER ?*CHILLS OR SWEATING ?*NAUSEA AND VOMITING THAT IS NOT CONTROLLED WITH YOUR NAUSEA MEDICATION ?*UNUSUAL SHORTNESS OF BREATH ?*UNUSUAL BRUISING OR BLEEDING ?*URINARY PROBLEMS (pain or burning when urinating, or frequent urination) ?*BOWEL PROBLEMS (unusual diarrhea, constipation, pain near the anus) ?TENDERNESS IN MOUTH AND THROAT WITH OR WITHOUT PRESENCE OF ULCERS (sore throat, sores in mouth, or a toothache) ?UNUSUAL RASH, SWELLING OR PAIN  ?UNUSUAL VAGINAL DISCHARGE OR ITCHING  ? ?Items with * indicate a potential emergency and should be followed up as soon as possible or go to the Emergency Department if any problems should occur. ? ?Please show the CHEMOTHERAPY ALERT CARD or IMMUNOTHERAPY  ALERT CARD at check-in to the Emergency Department and triage nurse. ? ?Should you have questions after your visit or need to cancel or reschedule your appointment, please contact [facility]  Dept: [phone]  and follow the prompts.  Office hours are 8:00 a.m. to 4:30 p.m. Monday - Friday. Please note that voicemails left after 4:00 p.m. may not be returned until the following business day.  We are closed weekends and major holidays. You have access to a nurse at all times for urgent questions. Please call the main number to the clinic Dept: [phone] and follow the prompts. ? ? ?For any non-urgent questions, you may also contact your provider using MyChart. We now offer e-Visits for anyone 46 and older to request care online for non-urgent symptoms. For details visit [email or web address]. ?  ?Also download the MyChart app! Go to the app store, search "MyChart", open the app, select [facility], and log in with your MyChart username and password. ? ?Due to Covid, a mask is required upon entering the hospital/clinic. If you do not have a mask, one will be given to you upon arrival. For doctor visits, patients may have 1 support person aged 62 or older with them. For treatment visits, patients cannot have anyone with them due to current Covid guidelines and our immunocompromised population.  ? ?Doxorubicin injection ?What is this medication? ?DOXORUBICIN (dox oh ROO bi sin) is a chemotherapy drug. It is used to treat many kinds of cancer like leukemia, lymphoma, neuroblastoma, sarcoma, and Wilms' tumor. It is also used to treat bladder cancer, breast cancer, lung cancer, ovarian  cancer, stomach cancer, and thyroid cancer. ?This medicine may be used for other purposes; ask your health care provider or pharmacist if you have questions. ?COMMON BRAND NAME(S): Adriamycin, Adriamycin PFS, Adriamycin RDF, Rubex ?What should I tell my care team before I take this medication? ?They need  to know if you have any of these conditions: ?heart disease ?history of low blood counts caused by a medicine ?liver disease ?recent or ongoing radiation therapy ?an unusual or allergic reaction to doxorubicin, other chemotherapy agents, other medicines, foods, dyes, or preservatives ?pregnant or trying to get pregnant ?breast-feeding ?How should I use this medication? ?This drug is given as an infusion into a vein. It is administered in a hospital or clinic by a specially trained health care professional. If you have pain, swelling, burning or any unusual feeling around the site of your injection, tell your health care professional right away. ?Talk to your pediatrician regarding the use of this medicine in children. Special care may be needed. ?Overdosage: If you think you have taken too much of this medicine contact a poison control center or emergency room at once. ?NOTE: This medicine is only for you. Do not share this medicine with others. ?What if I miss a dose? ?It is important not to miss your dose. Call your doctor or health care professional if you are unable to keep an appointment. ?What may interact with this medication? ?This medicine may interact with the following medications: ?6-mercaptopurine ?paclitaxel ?phenytoin ?St. John's Wort ?trastuzumab ?verapamil ?This list may not describe all possible interactions. Give your health care provider a list of all the medicines, herbs, non-prescription drugs, or dietary supplements you use. Also tell them if you smoke, drink alcohol, or use illegal drugs. Some items may interact with your medicine. ?What should I watch for while using this medication? ?This drug may make you feel generally unwell. This is not uncommon, as chemotherapy can affect healthy cells as well as cancer cells. Report any side effects. Continue your course of treatment even though you feel ill unless your doctor tells you to stop. ?There is a maximum amount of this medicine you should  receive throughout your life. The amount depends on the medical condition being treated and your overall health. Your doctor will watch how much of this medicine you receive in your lifetime. Tell your doctor if you have taken this medicine before. ?You may need blood work done while you are taking this medicine. ?Your urine may turn red for a few days after your dose. This is not blood. If your urine is dark or brown, call your doctor. ?In some cases, you may be given additional medicines to help with side effects. Follow all directions for their use. ?Call your doctor or health care professional for advice if you get a fever, chills or sore throat, or other symptoms of a cold or flu. Do not treat yourself. This drug decreases your body's ability to fight infections. Try to avoid being around people who are sick. ?This medicine may increase your risk to bruise or bleed. Call your doctor or health care professional if you notice any unusual bleeding. ?Talk to your doctor about your risk of cancer. You may be more at risk for certain types of cancers if you take this medicine. ?Do not become pregnant while taking this medicine or for 6 months after stopping it. Women should inform their doctor if they wish to become pregnant or think they might be pregnant. Men should not father a child  while taking this medicine and for 6 months after stopping it. There is a potential for serious side effects to an unborn child. Talk to your health care professional or pharmacist for more information. Do not breast-feed an infant while taking this medicine. ?This medicine has caused ovarian failure in some women and reduced sperm counts in some men This medicine may interfere with the ability to have a child. Talk with your doctor or health care professional if you are concerned about your fertility. ?This medicine may cause a decrease in Co-Enzyme Q-10. You should make sure that you get enough Co-Enzyme Q-10 while you are taking  this medicine. Discuss the foods you eat and the vitamins you take with your health care professional. ?What side effects may I notice from receiving this medication? ?Side effects that you should report t

## 2021-12-02 NOTE — ED Provider Notes (Signed)
Care assumed from Dr. Sherry Ruffing, patient with allergic reaction, needs to be observed until 0100. ? ?Patient states swelling has gone down.  She is felt to be safe for discharge.  She is discharged with a short course of prednisone and also a prescription for an epinephrine autoinjector. ?  ?Cheyenne Fuel, MD ?28/78/67 0129 ? ?

## 2021-12-02 NOTE — ED Notes (Addendum)
Pt's port a cath was accessed. It pulled back blood and flushed well. Pepcid started running and pt reported mild burning and burning increased. Upon assessment area was reddened and firm around site. RN deaccessed per EDP request and warm compresses applied. Unsure if allergic reaction to chlorhexidine or tegaderm or infiltration of pepcid. EDP ok with pt not getting full amount of pepcid at this time.  ?

## 2021-12-02 NOTE — ED Triage Notes (Signed)
Patient had chemo earlier today. She now complains of a swollen neck, neck pain, smaller looking throat and difficulty swallowing. Denies difficulty breathing.  ?

## 2021-12-03 ENCOUNTER — Telehealth: Payer: Self-pay

## 2021-12-03 NOTE — Telephone Encounter (Signed)
-----   Message from Rafael Bihari, RN sent at 12/02/2021  1:05 PM EDT ----- ?Regarding: Dr Burr Medico pt, first time Doxorubicin/Cytoxan. ?Dr Burr Medico pt, came in 12/02/21 for first time Doxorubicin and Cytoxan. Tolerated infusion well. Needs call back.  ? ?

## 2021-12-03 NOTE — Telephone Encounter (Signed)
Pt LVM stating she went to the ED last night.  Pt did not give details regarding ED visit.  Pt was called by Fortunato Curling, RN for follow-up on 1st Time Chemo.  Pt notified Barbaraann Share of details (see notes).  Louise also sent message to Dr. Burr Medico. ?

## 2021-12-03 NOTE — Telephone Encounter (Signed)
Cheyenne [name] went to the ED last night for swelling in neck and feeling of throat closing. She was treated for possible anaphylaxis and monitored for several. Pt given a prescription  prednisone 40 mg daily x 4 days.  She was waiting to hear from Dr. Ernestina Penna office as to wether or not to continue the prednisone. No dose taken today. ?Pt states that her neck is still swollen but less then last night. ?Her right chest is swollen from the port to her armpit and it is sore to touch. Pt states that the infusion from the benadryl through her port in the ED burned from the port to her arm pit. The area just below her port is tender to touch as well. ?No difficulty breathing noted. ?Told patient that this information would be given to Dr. Burr Medico to review. ?Pt has injection appointment tomorrow. ?

## 2021-12-03 NOTE — ED Notes (Signed)
Pt's site around port is less reddened and softer around the site. She still reports pain to touch but not more stinging. Fresh warm packs applied ?

## 2021-12-04 ENCOUNTER — Inpatient Hospital Stay: Payer: Federal, State, Local not specified - PPO

## 2021-12-04 ENCOUNTER — Other Ambulatory Visit: Payer: Self-pay

## 2021-12-04 VITALS — BP 131/82 | HR 97 | Temp 99.2°F | Resp 20

## 2021-12-04 DIAGNOSIS — Z124 Encounter for screening for malignant neoplasm of cervix: Secondary | ICD-10-CM | POA: Diagnosis not present

## 2021-12-04 DIAGNOSIS — C50312 Malignant neoplasm of lower-inner quadrant of left female breast: Secondary | ICD-10-CM

## 2021-12-04 DIAGNOSIS — Z01411 Encounter for gynecological examination (general) (routine) with abnormal findings: Secondary | ICD-10-CM | POA: Diagnosis not present

## 2021-12-04 DIAGNOSIS — Z5189 Encounter for other specified aftercare: Secondary | ICD-10-CM | POA: Diagnosis not present

## 2021-12-04 DIAGNOSIS — Z113 Encounter for screening for infections with a predominantly sexual mode of transmission: Secondary | ICD-10-CM | POA: Diagnosis not present

## 2021-12-04 DIAGNOSIS — Z5111 Encounter for antineoplastic chemotherapy: Secondary | ICD-10-CM | POA: Diagnosis not present

## 2021-12-04 DIAGNOSIS — Z01419 Encounter for gynecological examination (general) (routine) without abnormal findings: Secondary | ICD-10-CM | POA: Diagnosis not present

## 2021-12-04 DIAGNOSIS — Z5112 Encounter for antineoplastic immunotherapy: Secondary | ICD-10-CM | POA: Diagnosis not present

## 2021-12-04 DIAGNOSIS — Z6838 Body mass index (BMI) 38.0-38.9, adult: Secondary | ICD-10-CM | POA: Diagnosis not present

## 2021-12-04 DIAGNOSIS — D649 Anemia, unspecified: Secondary | ICD-10-CM | POA: Diagnosis not present

## 2021-12-04 DIAGNOSIS — Z79899 Other long term (current) drug therapy: Secondary | ICD-10-CM | POA: Diagnosis not present

## 2021-12-04 MED ORDER — PEGFILGRASTIM-CBQV 6 MG/0.6ML ~~LOC~~ SOSY
6.0000 mg | PREFILLED_SYRINGE | Freq: Once | SUBCUTANEOUS | Status: AC
  Administered 2021-12-04: 6 mg via SUBCUTANEOUS
  Filled 2021-12-04: qty 0.6

## 2021-12-10 ENCOUNTER — Encounter: Payer: Self-pay | Admitting: Hematology

## 2021-12-11 ENCOUNTER — Other Ambulatory Visit: Payer: Self-pay

## 2021-12-11 DIAGNOSIS — Z170 Estrogen receptor positive status [ER+]: Secondary | ICD-10-CM

## 2021-12-14 ENCOUNTER — Other Ambulatory Visit: Payer: Federal, State, Local not specified - PPO

## 2021-12-14 ENCOUNTER — Inpatient Hospital Stay (HOSPITAL_BASED_OUTPATIENT_CLINIC_OR_DEPARTMENT_OTHER): Payer: Federal, State, Local not specified - PPO | Admitting: Physician Assistant

## 2021-12-14 ENCOUNTER — Other Ambulatory Visit: Payer: Self-pay

## 2021-12-14 ENCOUNTER — Inpatient Hospital Stay: Payer: Federal, State, Local not specified - PPO

## 2021-12-14 VITALS — BP 108/70 | HR 102 | Temp 98.5°F | Resp 18

## 2021-12-14 VITALS — BP 121/67 | HR 92 | Temp 99.0°F | Resp 17 | Wt 201.0 lb

## 2021-12-14 DIAGNOSIS — Z5111 Encounter for antineoplastic chemotherapy: Secondary | ICD-10-CM | POA: Diagnosis not present

## 2021-12-14 DIAGNOSIS — D649 Anemia, unspecified: Secondary | ICD-10-CM | POA: Diagnosis not present

## 2021-12-14 DIAGNOSIS — Z5189 Encounter for other specified aftercare: Secondary | ICD-10-CM | POA: Diagnosis not present

## 2021-12-14 DIAGNOSIS — Z170 Estrogen receptor positive status [ER+]: Secondary | ICD-10-CM

## 2021-12-14 DIAGNOSIS — Z95828 Presence of other vascular implants and grafts: Secondary | ICD-10-CM

## 2021-12-14 DIAGNOSIS — Z5112 Encounter for antineoplastic immunotherapy: Secondary | ICD-10-CM | POA: Diagnosis not present

## 2021-12-14 DIAGNOSIS — D696 Thrombocytopenia, unspecified: Secondary | ICD-10-CM

## 2021-12-14 DIAGNOSIS — C50312 Malignant neoplasm of lower-inner quadrant of left female breast: Secondary | ICD-10-CM

## 2021-12-14 DIAGNOSIS — Z79899 Other long term (current) drug therapy: Secondary | ICD-10-CM | POA: Diagnosis not present

## 2021-12-14 LAB — CBC WITH DIFFERENTIAL (CANCER CENTER ONLY)
Abs Immature Granulocytes: 0.17 10*3/uL — ABNORMAL HIGH (ref 0.00–0.07)
Basophils Absolute: 0 10*3/uL (ref 0.0–0.1)
Basophils Relative: 0 %
Eosinophils Absolute: 0.1 10*3/uL (ref 0.0–0.5)
Eosinophils Relative: 1 %
HCT: 17 % — ABNORMAL LOW (ref 36.0–46.0)
Hemoglobin: 5.9 g/dL — CL (ref 12.0–15.0)
Immature Granulocytes: 3 %
Lymphocytes Relative: 37 %
Lymphs Abs: 1.9 10*3/uL (ref 0.7–4.0)
MCH: 33.5 pg (ref 26.0–34.0)
MCHC: 34.7 g/dL (ref 30.0–36.0)
MCV: 96.6 fL (ref 80.0–100.0)
Monocytes Absolute: 0.8 10*3/uL (ref 0.1–1.0)
Monocytes Relative: 15 %
Neutro Abs: 2.2 10*3/uL (ref 1.7–7.7)
Neutrophils Relative %: 44 %
Platelet Count: 81 10*3/uL — ABNORMAL LOW (ref 150–400)
RBC: 1.76 MIL/uL — ABNORMAL LOW (ref 3.87–5.11)
RDW: 18 % — ABNORMAL HIGH (ref 11.5–15.5)
Smear Review: NORMAL
WBC Count: 5.1 10*3/uL (ref 4.0–10.5)
nRBC: 0.4 % — ABNORMAL HIGH (ref 0.0–0.2)

## 2021-12-14 LAB — URINALYSIS, COMPLETE (UACMP) WITH MICROSCOPIC
Bilirubin Urine: NEGATIVE
Glucose, UA: NEGATIVE mg/dL
Ketones, ur: NEGATIVE mg/dL
Leukocytes,Ua: NEGATIVE
Nitrite: NEGATIVE
Protein, ur: NEGATIVE mg/dL
Specific Gravity, Urine: 1.017 (ref 1.005–1.030)
pH: 5 (ref 5.0–8.0)

## 2021-12-14 LAB — CMP (CANCER CENTER ONLY)
ALT: 13 U/L (ref 0–44)
AST: 14 U/L — ABNORMAL LOW (ref 15–41)
Albumin: 3.9 g/dL (ref 3.5–5.0)
Alkaline Phosphatase: 79 U/L (ref 38–126)
Anion gap: 6 (ref 5–15)
BUN: 11 mg/dL (ref 6–20)
CO2: 28 mmol/L (ref 22–32)
Calcium: 8.9 mg/dL (ref 8.9–10.3)
Chloride: 104 mmol/L (ref 98–111)
Creatinine: 0.94 mg/dL (ref 0.44–1.00)
GFR, Estimated: >60 mL/min (ref >60)
Glucose, Bld: 121 mg/dL — ABNORMAL HIGH (ref 70–99)
Potassium: 3.2 mmol/L — ABNORMAL LOW (ref 3.5–5.1)
Sodium: 138 mmol/L (ref 135–145)
Total Bilirubin: 0.3 mg/dL (ref 0.3–1.2)
Total Protein: 7.1 g/dL (ref 6.5–8.1)

## 2021-12-14 LAB — SAMPLE TO BLOOD BANK

## 2021-12-14 LAB — PREPARE RBC (CROSSMATCH)

## 2021-12-14 MED ORDER — DIPHENHYDRAMINE HCL 25 MG PO CAPS
25.0000 mg | ORAL_CAPSULE | Freq: Once | ORAL | Status: AC
  Administered 2021-12-14: 25 mg via ORAL
  Filled 2021-12-14: qty 1

## 2021-12-14 MED ORDER — HEPARIN SOD (PORK) LOCK FLUSH 100 UNIT/ML IV SOLN
250.0000 [IU] | INTRAVENOUS | Status: AC | PRN
  Administered 2021-12-14: 500 [IU]

## 2021-12-14 MED ORDER — SODIUM CHLORIDE 0.9% FLUSH
10.0000 mL | INTRAVENOUS | Status: AC | PRN
  Administered 2021-12-14: 10 mL

## 2021-12-14 MED ORDER — ACETAMINOPHEN 325 MG PO TABS
650.0000 mg | ORAL_TABLET | Freq: Once | ORAL | Status: AC
  Administered 2021-12-14: 650 mg via ORAL
  Filled 2021-12-14: qty 2

## 2021-12-14 MED ORDER — SODIUM CHLORIDE 0.9% IV SOLUTION
250.0000 mL | Freq: Once | INTRAVENOUS | Status: AC
  Administered 2021-12-14: 250 mL via INTRAVENOUS

## 2021-12-14 MED ORDER — SODIUM CHLORIDE 0.9 % IV SOLN: Freq: Once | INTRAVENOUS | Status: AC

## 2021-12-14 NOTE — Progress Notes (Signed)
? ? ? ?Symptom Management Consult note ?[facility]   ? ?Patient Care Team: ?Billie Ruddy, MD as PCP - General (Family Medicine) ?Erroll Luna, MD as Consulting Physician (General Surgery) ?Truitt Merle, MD as Consulting Physician (Hematology) ?Gery Pray, MD as Consulting Physician (Radiation Oncology)  ? ? ?Name of the patient: Cheyenne [name]  [id number]  [birth year]  ? ?Date of visit: 12/14/2021  ? ? ?Chief complaint/ Reason for visit- shortness of breath, fatigue ? ?Oncology History Overview Note  ? Cancer Staging  ?Malignant neoplasm of lower-inner quadrant of left breast in female, estrogen receptor positive ([place]) ?Staging form: Breast, AJCC 8th Edition ?- Clinical stage from 08/07/2021: Stage IIIA (cT2, cN1, cM0, G3, ER+, PR-, HER2-) - Signed by Truitt Merle, MD on 08/18/2021 ? ?  ?Malignant neoplasm of lower-inner quadrant of left breast in female, estrogen receptor positive ([place])  ?08/07/2021 Cancer Staging  ? Staging form: Breast, AJCC 8th Edition ?- Clinical stage from 08/07/2021: Stage IIB (cT2, cN0, cM0, G3, ER-, PR-, HER2-) - Signed by Truitt Merle, MD on 09/24/2021 ?Stage prefix: Initial diagnosis ?Histologic grading system: 3 grade system ? ?  ?08/07/2021 Mammogram  ? CLINICAL DATA:  Palpable mass in the LEFT breast since August. ?  ?EXAM: ?DIGITAL DIAGNOSTIC BILATERAL MAMMOGRAM WITH TOMOSYNTHESIS AND CAD; ?ULTRASOUND LEFT BREAST LIMITED ? ?IMPRESSION: ?1. Suspicious mass in the 7:30 o'clock location of the LEFT breast ?for which biopsy is recommended. ?2. LEFT axillary adenopathy, with at least 4 abnormal lymph nodes. ?  ?08/07/2021 Initial Biopsy  ? Diagnosis ?1. Breast, left, needle core biopsy, 7:30, ribbon clip ?- INVASIVE DUCTAL CARCINOMA ?- SEE COMMENT ?2. Lymph node, needle/core biopsy, left axilla, coil clip ?- NO CARCINOMA IDENTIFIED ?Microscopic Comment ?1. based on the biopsy, the carcinoma appears Nottingham grade 3 of 3 and measures 1.1 cm in greatest linear extent. ? ?1. PROGNOSTIC  INDICATORS ?Results: ?The tumor cells are EQUIVOCAL for Her2 (2+). Her2 by FISH will be performed and results reported separately. ?Estrogen Receptor: 2%, POSITIVE, WEAK STAINING INTENSITY ?Progesterone Receptor: <1%, NEGATIVE ?Proliferation Marker Ki67: 40% ? ?1. FLUORESCENCE IN-SITU HYBRIDIZATION ?Results: ?GROUP 5: HER2 **NEGATIVE** ?  ?08/17/2021 Initial Diagnosis  ? Malignant neoplasm of lower-inner quadrant of left breast in female, estrogen receptor positive ([place]) ? ?  ?08/19/2021 Genetic Testing  ? [facility] CancerNext-Expanded Panel is Negative. Report date is 09/02/2021. ? ?The CancerNext-Expanded gene panel offered by [facility] and includes sequencing, rearrangement, and RNA analysis for the following 77 genes: AIP, ALK, APC, ATM, AXIN2, BAP1, BARD1, BLM, BMPR1A, BRCA1, BRCA2, BRIP1, CDC73, CDH1, CDK4, CDKN1B, CDKN2A, CHEK2, CTNNA1, DICER1, FANCC, FH, FLCN, GALNT12, KIF1B, LZTR1, MAX, MEN1, MET, MLH1, MSH2, MSH3, MSH6, MUTYH, NBN, NF1, NF2, NTHL1, PALB2, PHOX2B, PMS2, POT1, PRKAR1A, PTCH1, PTEN, RAD51C, RAD51D, RB1, RECQL, RET, SDHA, SDHAF2, SDHB, SDHC, SDHD, SMAD4, SMARCA4, SMARCB1, SMARCE1, STK11, SUFU, TMEM127, TP53, TSC1, TSC2, VHL and XRCC2 (sequencing and deletion/duplication); EGFR, EGLN1, HOXB13, KIT, MITF, PDGFRA, POLD1, and POLE (sequencing only); EPCAM and GREM1 (deletion/duplication only).  ?  ?08/26/2021 Imaging  ? EXAM: ?BILATERAL BREAST MRI WITH AND WITHOUT CONTRAST ? ?IMPRESSION: ?2.3 cm mass in the lower-inner quadrant of the left breast ?corresponding with the biopsy proven invasive ductal carcinoma. ?  ?08/31/2021 PET scan  ? IMPRESSION: ?1. Known mass medially in the left breast is markedly hypermetabolic consistent with breast cancer. ?2. No evidence of nodal metastases within the chest. No typical ?distant metastases. ?3. Suspected acute inflammatory process involving central mesentery and pelvis with ill-defined focal hypermetabolic activity anterior  to the sacrum. There are pelvic  inflammatory changes and free pelvic fluid. Recent labs demonstrate mild leukocytosis. Differential considerations include acute appendicitis, enteritis and small perforated foreign body. No evidence of drainable fluid collection, pneumoperitoneum or bowel obstruction. ?4. These results were called by telephone at the time of ?interpretation on 08/31/2021 at 5:12 pm to on-call oncology nurse, ?[name], who verbally acknowledged these results. Unless there is a good clinical explanation for these findings, evaluation in the ?emergency department is likely warranted, and patient may benefit ?from abdominopelvic CT with oral and intravenous contrast. ?  ?09/01/2021 Imaging  ? EXAM: ?CT ABDOMEN AND PELVIS WITH CONTRAST ? ?IMPRESSION: ?Multiple thick-walled loops of distal small bowel, suggesting ?infectious/inflammatory enteritis. Associated small volume pelvic ?ascites. No pneumatosis or free air. ?  ?No evidence of bowel obstruction.  Normal appendix. ?  ?2.1 cm lesion in the medial left breast, likely corresponding to the ?patient's known primary breast neoplasm. No findings suspicious for metastatic disease. ?  ?09/09/2021 -  Chemotherapy  ? Patient is on Treatment Plan : BREAST Pembrolizumab (200) D1 + Carboplatin (5) D1 + Paclitaxel (80) D1,8,15 q21d X 4 cycles / Pembrolizumab (200) D1 + AC D1 q21d x 4 cycles  ? ?   ? ? ?Current Therapy: Cyclocphosphamide, doxorubicin, pembrolizumab day 1 cycle 5 on 12/02/21. Udenyca on 12/04/21 ? ?Interval history- Cheyenne [name] is a 41 yo female with oncologic history as above presenting to [facility] today with chief complaint of shortness of breath and fatigue x 5 days. She reports shortness of breath with activities which is new. She experienced extreme fatigue since last treatment. She reports additional symptoms of gum bleeding when brushing teeth despite using soft tooth brush, diaphoresis, lightheadedness and nausea without emesis that has been intermittent as well. She has a  blood pressure cuff at home and reports her pressures have been low and her heart rate in the 120s. She does admit to symptoms slightly improving over the last 2 days however she still feels poorly. She has also had increased nose bleeds recently, the last being x 1 week ago. She has had blood transfusions in the past and admits to feeling better afterwards usually. She denies any fever, chills, congestion, hemoptysis, chest pain, abdominal pain, urinary symptoms, diarrhea, blood in stool, rash, bruising.  Spouse present and provides additional history. ?Patient's first Beryle Flock was during most recent treatment. She symptoms of a delayed allergic reaction and was seen in the ED where she received epi, solumedrol, pepcid, and steroids. She states since being discharged the symptoms of throat tightness has resolved. ? ? ? ? ?ROS  ?All other systems are reviewed and are negative for acute change except as noted in the HPI. ? ? ? ?No Known Allergies ? ? ?Past Medical History:  ?Diagnosis Date  ? Anal fissure   ? Anxiety   ? Breast cancer ([place]) 08/10/21  ? Date diagnosis was given  ? DVT (deep venous thrombosis) ([place])   ? Incomplete right bundle branch block (RBBB) determined by electrocardiography 09/01/2021  ? ? ? ?Past Surgical History:  ?Procedure Laterality Date  ? NO PAST SURGERIES    ? PORTACATH PLACEMENT N/A 09/08/2021  ? Procedure: INSERTION PORT-A-CATH;  Surgeon: Erroll Luna, MD;  Location: [facility] ORS;  Service: General;  Laterality: N/A;  ? ? ?Social History  ? ?Socioeconomic History  ? Marital status: Married  ?  Spouse name: Not on file  ? Number of children: 0  ? Years of education: Not on file  ?  Highest education level: Not on file  ?Occupational History  ? Occupation: Claims Rep  ?Tobacco Use  ? Smoking status: Never  ? Smokeless tobacco: Never  ?Vaping Use  ? Vaping Use: Never used  ?Substance and Sexual Activity  ? Alcohol use: Yes  ?  Comment: Occasionally  ? Drug use: No  ? Sexual activity: Yes  ?   Partners: Female  ?Other Topics Concern  ? Not on file  ?Social History Narrative  ? Married. Education: [employer]. Exercise: No.  ? ?Social Determinants of Health  ? ?Financial Resource Strain: Not on file  ?Maida Sale

## 2021-12-14 NOTE — Patient Instructions (Signed)
Blood Transfusion, Adult, Care After This sheet gives you information about how to care for yourself after your procedure. Your doctor may also give you more specific instructions. If you have problems or questions, contact your doctor. What can I expect after the procedure? After the procedure, it is common to have: Bruising and soreness at the IV site. A headache. Follow these instructions at home: Insertion site care     Follow instructions from your doctor about how to take care of your insertion site. This is where an IV tube was put into your vein. Make sure you: Wash your hands with soap and water before and after you change your bandage (dressing). If you cannot use soap and water, use hand sanitizer. Change your bandage as told by your doctor. Check your insertion site every day for signs of infection. Check for: Redness, swelling, or pain. Bleeding from the site. Warmth. Pus or a bad smell. General instructions Take over-the-counter and prescription medicines only as told by your doctor. Rest as told by your doctor. Go back to your normal activities as told by your doctor. Keep all follow-up visits as told by your doctor. This is important. Contact a doctor if: You have itching or red, swollen areas of skin (hives). You feel worried or nervous (anxious). You feel weak after doing your normal activities. You have redness, swelling, warmth, or pain around the insertion site. You have blood coming from the insertion site, and the blood does not stop with pressure. You have pus or a bad smell coming from the insertion site. Get help right away if: You have signs of a serious reaction. This may be coming from an allergy or the body's defense system (immune system). Signs include: Trouble breathing or shortness of breath. Swelling of the face or feeling warm (flushed). Fever or chills. Head, chest, or back pain. Dark pee (urine) or blood in the pee. Widespread rash. Fast  heartbeat. Feeling dizzy or light-headed. You may receive your blood transfusion in an outpatient setting. If so, you will be told whom to contact to report any reactions. These symptoms may be an emergency. Do not wait to see if the symptoms will go away. Get medical help right away. Call your local emergency services (911 in the [place] Do not drive yourself to the hospital. Summary Bruising and soreness at the IV site are common. Check your insertion site every day for signs of infection. Rest as told by your doctor. Go back to your normal activities as told by your doctor. Get help right away if you have signs of a serious reaction. This information is not intended to replace advice given to you by your health care provider. Make sure you discuss any questions you have with your health care provider. Document Revised: 11/13/2020 Document Reviewed: 01/11/2019 Elsevier Patient Education  2023 Elsevier Inc.  

## 2021-12-14 NOTE — Progress Notes (Signed)
Per Dr. Burr Medico, ok to increase rate of blood to complete 2 units over 3 hours. Rate adjusted to 351m/hr. ?

## 2021-12-14 NOTE — Patient Instructions (Signed)

## 2021-12-15 LAB — BPAM RBC
Blood Product Expiration Date: 202306132359
Blood Product Expiration Date: 202306132359
ISSUE DATE / TIME: 202305151324
ISSUE DATE / TIME: 202305151324
Unit Type and Rh: 5100
Unit Type and Rh: 5100

## 2021-12-15 LAB — TYPE AND SCREEN
ABO/RH(D): O POS
Antibody Screen: NEGATIVE
Unit division: 0
Unit division: 0

## 2021-12-15 LAB — URINE CULTURE: Culture: NO GROWTH

## 2021-12-22 ENCOUNTER — Other Ambulatory Visit: Payer: Self-pay | Admitting: Nurse Practitioner

## 2021-12-22 MED FILL — Fosaprepitant Dimeglumine For IV Infusion 150 MG (Base Eq): INTRAVENOUS | Qty: 5 | Status: AC

## 2021-12-23 ENCOUNTER — Other Ambulatory Visit: Payer: Self-pay

## 2021-12-23 ENCOUNTER — Inpatient Hospital Stay: Payer: Federal, State, Local not specified - PPO

## 2021-12-23 ENCOUNTER — Inpatient Hospital Stay (HOSPITAL_BASED_OUTPATIENT_CLINIC_OR_DEPARTMENT_OTHER): Payer: Federal, State, Local not specified - PPO | Admitting: Hematology

## 2021-12-23 ENCOUNTER — Encounter: Payer: Self-pay | Admitting: Hematology

## 2021-12-23 VITALS — BP 115/68 | HR 88 | Temp 98.5°F | Resp 18 | Ht 62.0 in | Wt 197.8 lb

## 2021-12-23 DIAGNOSIS — Z170 Estrogen receptor positive status [ER+]: Secondary | ICD-10-CM

## 2021-12-23 DIAGNOSIS — Z95828 Presence of other vascular implants and grafts: Secondary | ICD-10-CM

## 2021-12-23 DIAGNOSIS — Z5189 Encounter for other specified aftercare: Secondary | ICD-10-CM | POA: Diagnosis not present

## 2021-12-23 DIAGNOSIS — Z5111 Encounter for antineoplastic chemotherapy: Secondary | ICD-10-CM | POA: Diagnosis not present

## 2021-12-23 DIAGNOSIS — Z5112 Encounter for antineoplastic immunotherapy: Secondary | ICD-10-CM | POA: Diagnosis not present

## 2021-12-23 DIAGNOSIS — C50312 Malignant neoplasm of lower-inner quadrant of left female breast: Secondary | ICD-10-CM | POA: Diagnosis not present

## 2021-12-23 DIAGNOSIS — Z79899 Other long term (current) drug therapy: Secondary | ICD-10-CM | POA: Diagnosis not present

## 2021-12-23 DIAGNOSIS — D649 Anemia, unspecified: Secondary | ICD-10-CM | POA: Diagnosis not present

## 2021-12-23 LAB — CMP (CANCER CENTER ONLY)
ALT: 24 U/L (ref 0–44)
AST: 23 U/L (ref 15–41)
Albumin: 4.2 g/dL (ref 3.5–5.0)
Alkaline Phosphatase: 84 U/L (ref 38–126)
Anion gap: 10 (ref 5–15)
BUN: 14 mg/dL (ref 6–20)
CO2: 26 mmol/L (ref 22–32)
Calcium: 9.5 mg/dL (ref 8.9–10.3)
Chloride: 103 mmol/L (ref 98–111)
Creatinine: 1.06 mg/dL — ABNORMAL HIGH (ref 0.44–1.00)
GFR, Estimated: >60 mL/min (ref >60)
Glucose, Bld: 138 mg/dL — ABNORMAL HIGH (ref 70–99)
Potassium: 3.6 mmol/L (ref 3.5–5.1)
Sodium: 139 mmol/L (ref 135–145)
Total Bilirubin: 0.5 mg/dL (ref 0.3–1.2)
Total Protein: 7.6 g/dL (ref 6.5–8.1)

## 2021-12-23 LAB — CBC WITH DIFFERENTIAL (CANCER CENTER ONLY)
Abs Immature Granulocytes: 0.05 10*3/uL (ref 0.00–0.07)
Basophils Absolute: 0 10*3/uL (ref 0.0–0.1)
Basophils Relative: 1 %
Eosinophils Absolute: 0 10*3/uL (ref 0.0–0.5)
Eosinophils Relative: 0 %
HCT: 28.9 % — ABNORMAL LOW (ref 36.0–46.0)
Hemoglobin: 9.8 g/dL — ABNORMAL LOW (ref 12.0–15.0)
Immature Granulocytes: 1 %
Lymphocytes Relative: 28 %
Lymphs Abs: 1.8 10*3/uL (ref 0.7–4.0)
MCH: 31.5 pg (ref 26.0–34.0)
MCHC: 33.9 g/dL (ref 30.0–36.0)
MCV: 92.9 fL (ref 80.0–100.0)
Monocytes Absolute: 0.8 10*3/uL (ref 0.1–1.0)
Monocytes Relative: 13 %
Neutro Abs: 3.6 10*3/uL (ref 1.7–7.7)
Neutrophils Relative %: 57 %
Platelet Count: 421 10*3/uL — ABNORMAL HIGH (ref 150–400)
RBC: 3.11 MIL/uL — ABNORMAL LOW (ref 3.87–5.11)
RDW: 18 % — ABNORMAL HIGH (ref 11.5–15.5)
WBC Count: 6.3 10*3/uL (ref 4.0–10.5)
nRBC: 0 % (ref 0.0–0.2)

## 2021-12-23 LAB — T4, FREE: Free T4: 0.75 ng/dL (ref 0.61–1.12)

## 2021-12-23 LAB — TSH: TSH: 2.57 u[IU]/mL (ref 0.350–4.500)

## 2021-12-23 MED ORDER — SODIUM CHLORIDE 0.9% FLUSH
10.0000 mL | Freq: Once | INTRAVENOUS | Status: AC
  Administered 2021-12-23: 10 mL

## 2021-12-23 MED ORDER — SODIUM CHLORIDE 0.9% FLUSH
10.0000 mL | INTRAVENOUS | Status: DC | PRN
  Administered 2021-12-23: 10 mL

## 2021-12-23 MED ORDER — PALONOSETRON HCL INJECTION 0.25 MG/5ML
0.2500 mg | Freq: Once | INTRAVENOUS | Status: AC
  Administered 2021-12-23: 0.25 mg via INTRAVENOUS
  Filled 2021-12-23: qty 5

## 2021-12-23 MED ORDER — HEPARIN SOD (PORK) LOCK FLUSH 100 UNIT/ML IV SOLN
500.0000 [IU] | Freq: Once | INTRAVENOUS | Status: AC | PRN
  Administered 2021-12-23: 500 [IU]

## 2021-12-23 MED ORDER — SODIUM CHLORIDE 0.9 % IV SOLN: Freq: Once | INTRAVENOUS | Status: AC

## 2021-12-23 MED ORDER — SODIUM CHLORIDE 0.9 % IV SOLN
600.0000 mg/m2 | Freq: Once | INTRAVENOUS | Status: AC
  Administered 2021-12-23: 1220 mg via INTRAVENOUS
  Filled 2021-12-23: qty 61

## 2021-12-23 MED ORDER — SODIUM CHLORIDE 0.9 % IV SOLN
200.0000 mg | Freq: Once | INTRAVENOUS | Status: AC
  Administered 2021-12-23: 200 mg via INTRAVENOUS
  Filled 2021-12-23: qty 200

## 2021-12-23 NOTE — Progress Notes (Signed)
[place]   [phone] [phone]   Clinic Follow up Note   Patient Care Team: Billie Ruddy, MD as PCP - General (Family Medicine) Erroll Luna, MD as Consulting Physician (General Surgery) Truitt Merle, MD as Consulting Physician (Hematology) Gery Pray, MD as Consulting Physician (Radiation Oncology)  Date of Service:  12/23/2021  CHIEF COMPLAINT: f/u of left breast cancer  CURRENT THERAPY:  Neoadjuvant Adriamycin and Cytoxan, q3weeks, starting 12/02/21 Beryle Flock, q3weeks, starting 09/09/21  ASSESSMENT & PLAN:  Cheyenne [name] is a 41 y.o. pre-menopausal female with   1. Malignant neoplasm of lower-inner quadrant of left breast, Stage IIIA, c(T2, N1), Functionally triple negative, Grade 3 -presented with palpable left breast mass. B/l MM and left [place] on 08/07/21 showed: 2.8 cm left breast mass at 7:30; at least 4 abnormal lymph nodes. Biopsy that day confirmed IDC in breast but node was negative. -breast MRI on 08/26/21 and PET on 08/31/21 were negative aside from primary mass. -Her breast cancer is ER 2% positive, PR and HER2 negative, this is similar to triple negative breast cancer.   -port placed 09/08/21 -she began neoadjuvant carbo/taxol and keytruda on 09/09/20. This will be followed by Adriamycin and Cytoxan every 3 weeks for 4 cycles, along with immunotherapy Keytruda every 3 weeks for 1 year. She completed Carbo/Taxol on 11/25/21; her breast mass became no longer palpable after cycle 3. -she has been tolerating chemo well overall, required blood transfusion twice so far  -she moved to [facility] with continued Keytruda on 12/02/21. She developed an allergic reaction about 8-9 hours later, with facial swelling around her jaw and some throat tightening. She was seen in the ED and given epinephrine, steroids, and antihistamines. Given this, we will change the Adriamycin to Epirubicin. We will try to make this change by tomorrow. -labs reviewed, CBC  greatly improved with blood transfusion on 5/15. CMP shows slightly worsening creatinine at 1.06, otherwise no concern. -Due to her severe anemia after last cycle of AC, we will check her interim lab after this cycle   2. Anemia -secondary to chemo -she has required blood transfusion several times during chemo, on 4/8, 4/19, and 5/15 -hgb improved to 9.8 today (12/23/21)   3. Social Support -she has good support from her wife. -she has a history of anxiety and is on Xanax as needed. I advised her to f/u with her counselor.     PLAN:  -proceed with cytoxan and pembrolizumab today -return tomorrow for epirubicin, with premedication IV Emend, Pepcid and Claritin -move injection to 5/27 -Lab on June 2, will send a blood sample to blood bank in case she needs blood transfusion -lab, flush, f/u, and cytoxan/epirubicin in 3 and 6 weeks as scheduled   No problem-specific Assessment & Plan notes found for this encounter.   SUMMARY OF ONCOLOGIC HISTORY: Oncology History Overview Note   Cancer Staging  Malignant neoplasm of lower-inner quadrant of left breast in female, estrogen receptor positive ([place]) Staging form: Breast, AJCC 8th Edition - Clinical stage from 08/07/2021: Stage IIIA (cT2, cN1, cM0, G3, ER+, PR-, HER2-) - Signed by Truitt Merle, MD on 08/18/2021    Malignant neoplasm of lower-inner quadrant of left breast in female, estrogen receptor positive ([place])  08/07/2021 Cancer Staging   Staging form: Breast, AJCC 8th Edition - Clinical stage from 08/07/2021: Stage IIB (cT2, cN0, cM0, G3, ER-, PR-, HER2-) - Signed by Truitt Merle, MD on 09/24/2021 Stage prefix: Initial diagnosis Histologic grading system: 3 grade system  08/07/2021 Mammogram   CLINICAL DATA:  Palpable mass in the LEFT breast since August.   EXAM: DIGITAL DIAGNOSTIC BILATERAL MAMMOGRAM WITH TOMOSYNTHESIS AND CAD; ULTRASOUND LEFT BREAST LIMITED  IMPRESSION: 1. Suspicious mass in the 7:30 o'clock location of the LEFT  breast for which biopsy is recommended. 2. LEFT axillary adenopathy, with at least 4 abnormal lymph nodes.   08/07/2021 Initial Biopsy   Diagnosis 1. Breast, left, needle core biopsy, 7:30, ribbon clip - INVASIVE DUCTAL CARCINOMA - SEE COMMENT 2. Lymph node, needle/core biopsy, left axilla, coil clip - NO CARCINOMA IDENTIFIED Microscopic Comment 1. based on the biopsy, the carcinoma appears Nottingham grade 3 of 3 and measures 1.1 cm in greatest linear extent.  1. PROGNOSTIC INDICATORS Results: The tumor cells are EQUIVOCAL for Her2 (2+). Her2 by FISH will be performed and results reported separately. Estrogen Receptor: 2%, POSITIVE, WEAK STAINING INTENSITY Progesterone Receptor: <1%, NEGATIVE Proliferation Marker Ki67: 40%  1. FLUORESCENCE IN-SITU HYBRIDIZATION Results: GROUP 5: HER2 **NEGATIVE**   08/17/2021 Initial Diagnosis   Malignant neoplasm of lower-inner quadrant of left breast in female, estrogen receptor positive ([place])    08/19/2021 Genetic Testing   [facility] CancerNext-Expanded Panel is Negative. Report date is 09/02/2021.  The CancerNext-Expanded gene panel offered by [facility] and includes sequencing, rearrangement, and RNA analysis for the following 77 genes: AIP, ALK, APC, ATM, AXIN2, BAP1, BARD1, BLM, BMPR1A, BRCA1, BRCA2, BRIP1, CDC73, CDH1, CDK4, CDKN1B, CDKN2A, CHEK2, CTNNA1, DICER1, FANCC, FH, FLCN, GALNT12, KIF1B, LZTR1, MAX, MEN1, MET, MLH1, MSH2, MSH3, MSH6, MUTYH, NBN, NF1, NF2, NTHL1, PALB2, PHOX2B, PMS2, POT1, PRKAR1A, PTCH1, PTEN, RAD51C, RAD51D, RB1, RECQL, RET, SDHA, SDHAF2, SDHB, SDHC, SDHD, SMAD4, SMARCA4, SMARCB1, SMARCE1, STK11, SUFU, TMEM127, TP53, TSC1, TSC2, VHL and XRCC2 (sequencing and deletion/duplication); EGFR, EGLN1, HOXB13, KIT, MITF, PDGFRA, POLD1, and POLE (sequencing only); EPCAM and GREM1 (deletion/duplication only).    08/26/2021 Imaging   EXAM: BILATERAL BREAST MRI WITH AND WITHOUT CONTRAST  IMPRESSION: 2.3 cm mass in the lower-inner  quadrant of the left breast corresponding with the biopsy proven invasive ductal carcinoma.   08/31/2021 PET scan   IMPRESSION: 1. Known mass medially in the left breast is markedly hypermetabolic consistent with breast cancer. 2. No evidence of nodal metastases within the chest. No typical distant metastases. 3. Suspected acute inflammatory process involving central mesentery and pelvis with ill-defined focal hypermetabolic activity anterior to the sacrum. There are pelvic inflammatory changes and free pelvic fluid. Recent labs demonstrate mild leukocytosis. Differential considerations include acute appendicitis, enteritis and small perforated foreign body. No evidence of drainable fluid collection, pneumoperitoneum or bowel obstruction. 4. These results were called by telephone at the time of interpretation on 08/31/2021 at 5:12 pm to on-call oncology nurse, [name], who verbally acknowledged these results. Unless there is a good clinical explanation for these findings, evaluation in the emergency department is likely warranted, and patient may benefit from abdominopelvic CT with oral and intravenous contrast.   09/01/2021 Imaging   EXAM: CT ABDOMEN AND PELVIS WITH CONTRAST  IMPRESSION: Multiple thick-walled loops of distal small bowel, suggesting infectious/inflammatory enteritis. Associated small volume pelvic ascites. No pneumatosis or free air.   No evidence of bowel obstruction.  Normal appendix.   2.1 cm lesion in the medial left breast, likely corresponding to the patient's known primary breast neoplasm. No findings suspicious for metastatic disease.   09/09/2021 -  Chemotherapy   Patient is on Treatment Plan : BREAST Pembrolizumab (200) D1 + Carboplatin (5) D1 + Paclitaxel (80) D1,8,15 q21d X 4 cycles /  Pembrolizumab (200) D1 + AC D1 q21d x 4 cycles         INTERVAL HISTORY:  Cheyenne [name] is here for a follow up of breast cancer. She was last seen by me on 12/01/21 with  [facility] visit in the interim. She presents to the clinic accompanied by her wife. She reports she is feeling better since her blood transfusion.   All other systems were reviewed with the patient and are negative.  MEDICAL HISTORY:  Past Medical History:  Diagnosis Date   Anal fissure    Anxiety    Breast cancer ([place]) 08/10/21   Date diagnosis was given   DVT (deep venous thrombosis) ([place])    Incomplete right bundle branch block (RBBB) determined by electrocardiography 09/01/2021    SURGICAL HISTORY: Past Surgical History:  Procedure Laterality Date   NO PAST SURGERIES     PORTACATH PLACEMENT N/A 09/08/2021   Procedure: INSERTION PORT-A-CATH;  Surgeon: Erroll Luna, MD;  Location: [facility] ORS;  Service: General;  Laterality: N/A;    I have reviewed the social history and family history with the patient and they are unchanged from previous note.  ALLERGIES:  has No Known Allergies.  MEDICATIONS:  Current Outpatient Medications  Medication Sig Dispense Refill   acetaminophen (TYLENOL) 500 MG tablet Take 500-1,000 mg by mouth every 6 (six) hours as needed (pain.).     albuterol (VENTOLIN HFA) 108 (90 Base) MCG/ACT inhaler Inhale 2 puffs into the lungs every 4 (four) hours as needed for wheezing. 1 each 5   ALPRAZolam (XANAX) 0.5 MG tablet Take one tablet twice daily as needed for anxiety. 45 tablet 2   cyclobenzaprine (FLEXERIL) 10 MG tablet Take 10 mg by mouth daily as needed for muscle spasms.     desonide (DESOWEN) 0.05 % cream Apply topically 2 (two) times daily. (Patient taking differently: Apply 1 application. topically 2 (two) times daily as needed (skin irritation.).) 30 g 1   desonide (DESOWEN) 0.05 % lotion Apply 1 application. topically daily as needed (acne).     EPINEPHrine 0.3 mg/0.3 mL IJ SOAJ injection Inject 0.3 mg into the muscle as needed for anaphylaxis. 1 each 0   hydroquinone 4 % cream APPLY TO DARK SPOTS AT BEDTIME (Patient taking differently: Apply 1 application.  topically daily as needed (dark spots). APPLY TO DARK SPOTS AT BEDTIME) 28.35 g 0   ibuprofen (ADVIL) 800 MG tablet Take 1 tablet (800 mg total) by mouth every 8 (eight) hours as needed. 30 tablet 0   KLOR-CON M20 20 MEQ tablet TAKE 1 TABLET BY MOUTH TWICE A DAY 60 tablet 0   lidocaine-prilocaine (EMLA) cream Apply to affected area once (Patient taking differently: 1 application. as directed. Apply to affected area once for chemo) 30 g 3   loratadine (CLARITIN) 10 MG tablet Take 10 mg by mouth daily.     Multiple Vitamins-Minerals (ONE A DAY IMMUNITY DEFENSE) CHEW Chew 1 tablet by mouth daily.     ondansetron (ZOFRAN) 8 MG tablet Take 1 tablet (8 mg total) by mouth 2 (two) times daily as needed. Start on the third day after carboplatin and AC chemotherapy. 30 tablet 1   oxyCODONE (OXY IR/ROXICODONE) 5 MG immediate release tablet Take 1 tablet (5 mg total) by mouth every 6 (six) hours as needed for severe pain. (Patient not taking: Reported on 12/02/2021) 15 tablet 0   prochlorperazine (COMPAZINE) 10 MG tablet Take 1 tablet (10 mg total) by mouth every 6 (six) hours as needed (  Nausea or vomiting). 30 tablet 1   traMADol (ULTRAM) 50 MG tablet Take 1 tablet (50 mg total) by mouth every 6 (six) hours as needed. (Patient taking differently: Take 50 mg by mouth every 6 (six) hours as needed for moderate pain.) 5 tablet 0   No current facility-administered medications for this visit.   Facility-Administered Medications Ordered in Other Visits  Medication Dose Route Frequency Provider Last Rate Last Admin   cyclophosphamide (CYTOXAN) 1,220 mg in sodium chloride 0.9 % 250 mL chemo infusion  600 mg/m2 (Treatment Plan Recorded) Intravenous Once Truitt Merle, MD       heparin lock flush 100 unit/mL  500 Units Intracatheter Once PRN Truitt Merle, MD       sodium chloride flush (NS) 0.9 % injection 10 mL  10 mL Intracatheter PRN Truitt Merle, MD        PHYSICAL EXAMINATION: ECOG PERFORMANCE STATUS: 2 - Symptomatic, <50%  confined to bed  Vitals:   12/23/21 0836  BP: 115/68  Pulse: 88  Resp: 18  Temp: 98.5 F (36.9 C)  SpO2: 100%   Wt Readings from Last 3 Encounters:  12/23/21 197 lb 12.8 oz (89.7 kg)  12/14/21 201 lb (91.2 kg)  12/14/21 201 lb 6.4 oz (91.4 kg)     GENERAL:alert, no distress and comfortable SKIN: skin color normal, no rashes or significant lesions EYES: normal, Conjunctiva are pink and non-injected, sclera clear  NEURO: alert & oriented x 3 with fluent speech  LABORATORY DATA:  I have reviewed the data as listed    Latest Ref Rng & Units 12/23/2021    8:19 AM 12/14/2021   11:36 AM 12/01/2021    9:02 AM  CBC  WBC 4.0 - 10.5 K/uL 6.3   5.1   6.5    Hemoglobin 12.0 - 15.0 g/dL 9.8   5.9   8.8    Hematocrit 36.0 - 46.0 % 28.9   17.0   25.2    Platelets 150 - 400 K/uL 421   81   141          Latest Ref Rng & Units 12/23/2021    8:19 AM 12/14/2021   10:34 AM 12/01/2021    9:02 AM  CMP  Glucose 70 - 99 mg/dL 138   121   120    BUN 6 - 20 mg/dL _0 Creatinine 0.44 - 1.00 mg/dL 1.06   0.94   0.94    Sodium 135 - 145 mmol/L 139   138   140    Potassium 3.5 - 5.1 mmol/L 3.6   3.2   4.1    Chloride 98 - 111 mmol/L 103   104   103    CO2 22 - 32 mmol/L _1 Calcium 8.9 - 10.3 mg/dL 9.5   8.9   9.5    Total Protein 6.5 - 8.1 g/dL 7.6   7.1   7.8    Total Bilirubin 0.3 - 1.2 mg/dL 0.5   0.3   0.4    Alkaline Phos 38 - 126 U/L 84   79   104    AST 15 - 41 U/L _2 ALT 0 - 44 U/L 24   13   43        RADIOGRAPHIC STUDIES: I have personally reviewed the radiological images as listed and agreed with  the findings in the report. No results found.    No orders of the defined types were placed in this encounter.  All questions were answered. The patient knows to call the clinic with any problems, questions or concerns. No barriers to learning was detected. The total time spent in the appointment was 30 minutes.     Truitt Merle, MD 12/23/2021   I,  Wilburn Mylar, am acting as scribe for Truitt Merle, MD.   I have reviewed the above documentation for accuracy and completeness, and I agree with the above.

## 2021-12-23 NOTE — Patient Instructions (Signed)
[facility]  Discharge Instructions: Thank you for choosing [facility] to provide your oncology and hematology care.   If you have a lab appointment with the [facility], please go directly to the [facility] and check in at the registration area.   Wear comfortable clothing and clothing appropriate for easy access to any Portacath or PICC line.   We strive to give you quality time with your provider. You may need to reschedule your appointment if you arrive late (15 or more minutes).  Arriving late affects you and other patients whose appointments are after yours.  Also, if you miss three or more appointments without notifying the office, you may be dismissed from the clinic at the provider's discretion.      For prescription refill requests, have your pharmacy contact our office and allow 72 hours for refills to be completed.    Today you received the following chemotherapy and/or immunotherapy agents: Keytruda and Cytoxan.       To help prevent nausea and vomiting after your treatment, we encourage you to take your nausea medication as directed.  BELOW ARE SYMPTOMS THAT SHOULD BE REPORTED IMMEDIATELY: *FEVER GREATER THAN 100.4 F (38 C) OR HIGHER *CHILLS OR SWEATING *NAUSEA AND VOMITING THAT IS NOT CONTROLLED WITH YOUR NAUSEA MEDICATION *UNUSUAL SHORTNESS OF BREATH *UNUSUAL BRUISING OR BLEEDING *URINARY PROBLEMS (pain or burning when urinating, or frequent urination) *BOWEL PROBLEMS (unusual diarrhea, constipation, pain near the anus) TENDERNESS IN MOUTH AND THROAT WITH OR WITHOUT PRESENCE OF ULCERS (sore throat, sores in mouth, or a toothache) UNUSUAL RASH, SWELLING OR PAIN  UNUSUAL VAGINAL DISCHARGE OR ITCHING   Items with * indicate a potential emergency and should be followed up as soon as possible or go to the Emergency Department if any problems should occur.  Please show the CHEMOTHERAPY ALERT CARD or IMMUNOTHERAPY ALERT CARD at  check-in to the Emergency Department and triage nurse.  Should you have questions after your visit or need to cancel or reschedule your appointment, please contact [facility]  Dept: [phone]  and follow the prompts.  Office hours are 8:00 a.m. to 4:30 p.m. Monday - Friday. Please note that voicemails left after 4:00 p.m. may not be returned until the following business day.  We are closed weekends and major holidays. You have access to a nurse at all times for urgent questions. Please call the main number to the clinic Dept: [phone] and follow the prompts.   For any non-urgent questions, you may also contact your provider using MyChart. We now offer e-Visits for anyone 2 and older to request care online for non-urgent symptoms. For details visit [email or web address].   Also download the MyChart app! Go to the app store, search "MyChart", open the app, select [facility], and log in with your MyChart username and password.  Due to Covid, a mask is required upon entering the hospital/clinic. If you do not have a mask, one will be given to you upon arrival. For doctor visits, patients may have 1 support person aged 41 or older with them. For treatment visits, patients cannot have anyone with them due to current Covid guidelines and our immunocompromised population.

## 2021-12-24 ENCOUNTER — Encounter: Payer: Self-pay | Admitting: Hematology

## 2021-12-24 ENCOUNTER — Inpatient Hospital Stay: Payer: Federal, State, Local not specified - PPO

## 2021-12-24 VITALS — BP 104/61 | HR 87 | Temp 97.8°F | Resp 18

## 2021-12-24 DIAGNOSIS — Z5111 Encounter for antineoplastic chemotherapy: Secondary | ICD-10-CM | POA: Diagnosis not present

## 2021-12-24 DIAGNOSIS — C50312 Malignant neoplasm of lower-inner quadrant of left female breast: Secondary | ICD-10-CM | POA: Diagnosis not present

## 2021-12-24 DIAGNOSIS — Z5112 Encounter for antineoplastic immunotherapy: Secondary | ICD-10-CM | POA: Diagnosis not present

## 2021-12-24 DIAGNOSIS — Z5189 Encounter for other specified aftercare: Secondary | ICD-10-CM | POA: Diagnosis not present

## 2021-12-24 DIAGNOSIS — Z95828 Presence of other vascular implants and grafts: Secondary | ICD-10-CM

## 2021-12-24 DIAGNOSIS — Z79899 Other long term (current) drug therapy: Secondary | ICD-10-CM | POA: Diagnosis not present

## 2021-12-24 DIAGNOSIS — D649 Anemia, unspecified: Secondary | ICD-10-CM | POA: Diagnosis not present

## 2021-12-24 MED ORDER — DIPHENHYDRAMINE HCL 50 MG/ML IJ SOLN
50.0000 mg | Freq: Once | INTRAMUSCULAR | Status: AC
  Administered 2021-12-24: 50 mg via INTRAVENOUS
  Filled 2021-12-24: qty 1

## 2021-12-24 MED ORDER — EPIRUBICIN HCL CHEMO IV INJECTION 200 MG/100ML
99.5000 mg/m2 | Freq: Once | INTRAVENOUS | Status: AC
  Administered 2021-12-24: 200 mg via INTRAVENOUS
  Filled 2021-12-24: qty 100

## 2021-12-24 MED ORDER — HEPARIN SOD (PORK) LOCK FLUSH 100 UNIT/ML IV SOLN
500.0000 [IU] | Freq: Once | INTRAVENOUS | Status: AC
  Administered 2021-12-24: 500 [IU]

## 2021-12-24 MED ORDER — SODIUM CHLORIDE 0.9 % IV SOLN: INTRAVENOUS | Status: DC

## 2021-12-24 MED ORDER — FAMOTIDINE IN NACL 20-0.9 MG/50ML-% IV SOLN
20.0000 mg | Freq: Once | INTRAVENOUS | Status: AC
  Administered 2021-12-24: 20 mg via INTRAVENOUS
  Filled 2021-12-24: qty 50

## 2021-12-24 MED ORDER — SODIUM CHLORIDE 0.9% FLUSH
10.0000 mL | Freq: Once | INTRAVENOUS | Status: AC
  Administered 2021-12-24: 10 mL

## 2021-12-24 MED ORDER — SODIUM CHLORIDE 0.9 % IV SOLN
150.0000 mg | Freq: Once | INTRAVENOUS | Status: AC
  Administered 2021-12-24: 150 mg via INTRAVENOUS
  Filled 2021-12-24: qty 150

## 2021-12-24 NOTE — Patient Instructions (Signed)
[facility]  Discharge Instructions: Thank you for choosing [facility] to provide your oncology and hematology care.   If you have a lab appointment with the [facility], please go directly to the [facility] and check in at the registration area.   Wear comfortable clothing and clothing appropriate for easy access to any Portacath or PICC line.   We strive to give you quality time with your provider. You may need to reschedule your appointment if you arrive late (15 or more minutes).  Arriving late affects you and other patients whose appointments are after yours.  Also, if you miss three or more appointments without notifying the office, you may be dismissed from the clinic at the provider's discretion.      For prescription refill requests, have your pharmacy contact our office and allow 72 hours for refills to be completed.    Today you received the following chemotherapy and/or immunotherapy agent: Epirubicin      To help prevent nausea and vomiting after your treatment, we encourage you to take your nausea medication as directed.  BELOW ARE SYMPTOMS THAT SHOULD BE REPORTED IMMEDIATELY: *FEVER GREATER THAN 100.4 F (38 C) OR HIGHER *CHILLS OR SWEATING *NAUSEA AND VOMITING THAT IS NOT CONTROLLED WITH YOUR NAUSEA MEDICATION *UNUSUAL SHORTNESS OF BREATH *UNUSUAL BRUISING OR BLEEDING *URINARY PROBLEMS (pain or burning when urinating, or frequent urination) *BOWEL PROBLEMS (unusual diarrhea, constipation, pain near the anus) TENDERNESS IN MOUTH AND THROAT WITH OR WITHOUT PRESENCE OF ULCERS (sore throat, sores in mouth, or a toothache) UNUSUAL RASH, SWELLING OR PAIN  UNUSUAL VAGINAL DISCHARGE OR ITCHING   Items with * indicate a potential emergency and should be followed up as soon as possible or go to the Emergency Department if any problems should occur.  Please show the CHEMOTHERAPY ALERT CARD or IMMUNOTHERAPY ALERT CARD at check-in to  the Emergency Department and triage nurse.  Should you have questions after your visit or need to cancel or reschedule your appointment, please contact [facility]  Dept: [phone]  and follow the prompts.  Office hours are 8:00 a.m. to 4:30 p.m. Monday - Friday. Please note that voicemails left after 4:00 p.m. may not be returned until the following business day.  We are closed weekends and major holidays. You have access to a nurse at all times for urgent questions. Please call the main number to the clinic Dept: [phone] and follow the prompts.   For any non-urgent questions, you may also contact your provider using MyChart. We now offer e-Visits for anyone 48 and older to request care online for non-urgent symptoms. For details visit [email or web address].   Also download the MyChart app! Go to the app store, search "MyChart", open the app, select [facility], and log in with your MyChart username and password.  Due to Covid, a mask is required upon entering the hospital/clinic. If you do not have a mask, one will be given to you upon arrival. For doctor visits, patients may have 1 support person aged 53 or older with them. For treatment visits, patients cannot have anyone with them due to current Covid guidelines and our immunocompromised population.    Epirubicin injection What is this medication? EPIRUBICIN (ep i ROO bi sin) is a chemotherapy drug. This medicine is used to treat breast cancer. This medicine may be used for other purposes; ask your health care provider or pharmacist if you have questions. COMMON BRAND NAME(S): Ellence What  should I tell my care team before I take this medication? They need to know if you have any of these conditions: heart disease history of irregular heartbeat history of low blood counts caused by a medicine kidney disease liver disease recent heart attack recent or ongoing radiation therapy recently received or  scheduled to receive a vaccine an unusual or allergic reaction to epirubicin, other chemotherapy agents, other medicines, foods, dyes, or preservatives pregnant or trying to get pregnant breast-feeding How should I use this medication? This medicine is for infusion into a vein. It is given by a healthcare professional in a hospital or clinic setting. Talk to your pediatrician regarding the use of this medicine in children. Special care may be needed. Overdosage: If you think you have taken too much of this medicine contact a poison control center or emergency room at once. NOTE: This medicine is only for you. Do not share this medicine with others. What if I miss a dose? It is important not to miss your dose. Call your doctor or health care professional if you are unable to keep an appointment. What may interact with this medication? This medicine may interact with the following medications: calcium channel blockers cimetidine docetaxel paclitaxel trastuzumab live virus vaccines This list may not describe all possible interactions. Give your health care provider a list of all the medicines, herbs, non-prescription drugs, or dietary supplements you use. Also tell them if you smoke, drink alcohol, or use illegal drugs. Some items may interact with your medicine. What should I watch for while using this medication? Your condition will be monitored carefully while you are receiving this medicine. You will need important blood work done while you are taking this medicine. This medicine may make you feel generally unwell. This is not uncommon as chemotherapy can affect healthy cells as well as cancer cells. Report any side effects. Continue your course of treatment even though you feel ill unless your healthcare professional tells you to stop. Your urine may turn red for a few days after your dose. This is not blood. If your urine is dark or brown, call your doctor. This medicine may increase your  risk of getting an infection. Call your healthcare professional for advice if you get a fever, chills, or sore throat, or other symptoms of a cold or flu. Do not treat yourself. Try to avoid being around people who are sick. Call your healthcare professional if you are around anyone with measles, chickenpox, or if you develop sores or blisters that do not heal properly. Call your healthcare professional for advice if you get a fever, chills, or sore throat, or other symptoms of a cold or flu. Do not treat yourself. This medicine decreases your body's ability to fight infections. Try to avoid being around people who are sick. Avoid taking medicines that contain aspirin, acetaminophen, ibuprofen, naproxen, or ketoprofen unless instructed by your healthcare professional. These medicines may hide a fever. Be careful brushing or flossing your teeth or using a toothpick because you may get an infection or bleed more easily. If you have any dental work done, tell your dentist you are receiving this medicine. Do not become pregnant while taking this medicine or for 6 months after stopping it. Women should inform their healthcare professional if they wish to become pregnant or think they might be pregnant. Men should not father a child while taking this medicine and for 3 months after stopping it. There is potential for serious side effects to an unborn  child. Talk to your healthcare professional for more information. Do not breast-feed an infant while taking this medicine or for at least 7 days after stopping it. This medicine has caused ovarian failure in some women. This medicine may make it more difficult to get pregnant. Talk to your healthcare professional if you are concerned about your fertility. This medicine has caused decreased sperm counts in some men. This may make it more difficult to father a child. Talk to your healthcare professional if you are concerned about your fertility. There is a maximum  amount of this medicine you should receive throughout your life. The amount depends on the medical condition being treated and your overall health. Your healthcare professional will watch how much of this medicine you receive. Tell your healthcare professional if you have taken this medicine before. This medicine can make you more sensitive to the sun. Keep out of the sun, If you cannot avoid being in the sun, wear protective clothing and sunscreen. Do not use sun lamps or tanning beds/booths What side effects may I notice from receiving this medication? Side effects that you should report to your doctor or health care professional as soon as possible: allergic reactions like skin rash, itching or hives, swelling of the face, lips, or tongue chest pain or palpitations feeling faint or lightheaded, falls pain, redness, or irritation at site where injected signs and symptoms of a blood clot such as breathing problems; changes in vision; chest pain; severe, sudden headache; pain, swelling, warmth in the leg; trouble speaking; sudden numbness or weakness of the face, arm or leg signs and symptoms of a dangerous change in heartbeat or heart rhythm like chest pain; dizziness; fast or irregular heartbeat; palpitations; feeling faint or lightheaded, falls; breathing problems signs and symptoms of heart failure like breathing problems, fast, irregular heartbeat, sudden weight gain; swelling of the ankles, feet, hands; unusually weak or tired signs of decreased platelets or bleeding - bruising, pinpoint red spots on the skin, black, tarry stools, blood in the urine signs of decreased red blood cells - unusually weak or tired, feeling faint or lightheaded, falls signs of infection - fever or chills, cough, sore throat, pain or difficulty passing urine Side effects that usually do not require medical attention (report to your doctor or health care professional if they continue or are bothersome): changes in skin  or nail color diarrhea dizziness hair loss increased skin sensitivity to the sun loss of appetite mouth sores nausea, vomiting red urine This list may not describe all possible side effects. Call your doctor for medical advice about side effects. You may report side effects to FDA at 1-800-FDA-1088. Where should I keep my medication? This drug is given in a hospital or clinic and will not be stored at home. NOTE: This sheet is a summary. It may not cover all possible information. If you have questions about this medicine, talk to your doctor, pharmacist, or health care provider.  2023 [employer]vier/Gold Standard (2021-06-19 00:00:00)

## 2021-12-24 NOTE — Progress Notes (Signed)
Epirubicin IV push, blood return noted pre, during, and after infusion.  Patient tolerated well with no c/o during infusion

## 2021-12-25 ENCOUNTER — Ambulatory Visit: Payer: Federal, State, Local not specified - PPO

## 2021-12-26 ENCOUNTER — Other Ambulatory Visit: Payer: Self-pay

## 2021-12-26 ENCOUNTER — Inpatient Hospital Stay: Payer: Federal, State, Local not specified - PPO

## 2021-12-26 VITALS — BP 115/69 | HR 88 | Temp 98.0°F

## 2021-12-26 DIAGNOSIS — Z79899 Other long term (current) drug therapy: Secondary | ICD-10-CM | POA: Diagnosis not present

## 2021-12-26 DIAGNOSIS — C50312 Malignant neoplasm of lower-inner quadrant of left female breast: Secondary | ICD-10-CM | POA: Diagnosis not present

## 2021-12-26 DIAGNOSIS — Z5111 Encounter for antineoplastic chemotherapy: Secondary | ICD-10-CM | POA: Diagnosis not present

## 2021-12-26 DIAGNOSIS — D649 Anemia, unspecified: Secondary | ICD-10-CM | POA: Diagnosis not present

## 2021-12-26 DIAGNOSIS — Z5112 Encounter for antineoplastic immunotherapy: Secondary | ICD-10-CM | POA: Diagnosis not present

## 2021-12-26 DIAGNOSIS — Z5189 Encounter for other specified aftercare: Secondary | ICD-10-CM | POA: Diagnosis not present

## 2021-12-26 MED ORDER — PEGFILGRASTIM-CBQV 6 MG/0.6ML ~~LOC~~ SOSY
6.0000 mg | PREFILLED_SYRINGE | Freq: Once | SUBCUTANEOUS | Status: AC
  Administered 2021-12-26: 6 mg via SUBCUTANEOUS

## 2021-12-26 NOTE — Patient Instructions (Signed)

## 2021-12-29 ENCOUNTER — Other Ambulatory Visit: Payer: Self-pay | Admitting: Pharmacist

## 2022-01-01 ENCOUNTER — Inpatient Hospital Stay: Payer: Federal, State, Local not specified - PPO | Attending: Hematology

## 2022-01-01 ENCOUNTER — Inpatient Hospital Stay: Payer: Federal, State, Local not specified - PPO

## 2022-01-01 ENCOUNTER — Other Ambulatory Visit: Payer: Self-pay

## 2022-01-01 DIAGNOSIS — Z5189 Encounter for other specified aftercare: Secondary | ICD-10-CM | POA: Diagnosis not present

## 2022-01-01 DIAGNOSIS — Z79899 Other long term (current) drug therapy: Secondary | ICD-10-CM | POA: Diagnosis not present

## 2022-01-01 DIAGNOSIS — C50312 Malignant neoplasm of lower-inner quadrant of left female breast: Secondary | ICD-10-CM | POA: Diagnosis not present

## 2022-01-01 DIAGNOSIS — Z5111 Encounter for antineoplastic chemotherapy: Secondary | ICD-10-CM | POA: Insufficient documentation

## 2022-01-01 DIAGNOSIS — D649 Anemia, unspecified: Secondary | ICD-10-CM | POA: Insufficient documentation

## 2022-01-01 DIAGNOSIS — Z5112 Encounter for antineoplastic immunotherapy: Secondary | ICD-10-CM | POA: Insufficient documentation

## 2022-01-01 DIAGNOSIS — K1379 Other lesions of oral mucosa: Secondary | ICD-10-CM | POA: Insufficient documentation

## 2022-01-01 DIAGNOSIS — Z170 Estrogen receptor positive status [ER+]: Secondary | ICD-10-CM

## 2022-01-01 LAB — CBC WITH DIFFERENTIAL (CANCER CENTER ONLY)
Abs Immature Granulocytes: 0.24 10*3/uL — ABNORMAL HIGH (ref 0.00–0.07)
Basophils Absolute: 0.1 10*3/uL (ref 0.0–0.1)
Basophils Relative: 2 %
Eosinophils Absolute: 0.1 10*3/uL (ref 0.0–0.5)
Eosinophils Relative: 2 %
HCT: 24.6 % — ABNORMAL LOW (ref 36.0–46.0)
Hemoglobin: 8.5 g/dL — ABNORMAL LOW (ref 12.0–15.0)
Immature Granulocytes: 6 %
Lymphocytes Relative: 29 %
Lymphs Abs: 1.1 10*3/uL (ref 0.7–4.0)
MCH: 32.3 pg (ref 26.0–34.0)
MCHC: 34.6 g/dL (ref 30.0–36.0)
MCV: 93.5 fL (ref 80.0–100.0)
Monocytes Absolute: 0.7 10*3/uL (ref 0.1–1.0)
Monocytes Relative: 17 %
Neutro Abs: 1.7 10*3/uL (ref 1.7–7.7)
Neutrophils Relative %: 44 %
Platelet Count: 151 10*3/uL (ref 150–400)
RBC: 2.63 MIL/uL — ABNORMAL LOW (ref 3.87–5.11)
RDW: 16.7 % — ABNORMAL HIGH (ref 11.5–15.5)
Smear Review: NORMAL
WBC Count: 3.9 10*3/uL — ABNORMAL LOW (ref 4.0–10.5)
nRBC: 0 % (ref 0.0–0.2)

## 2022-01-01 LAB — CMP (CANCER CENTER ONLY)
ALT: 16 U/L (ref 0–44)
AST: 16 U/L (ref 15–41)
Albumin: 4.1 g/dL (ref 3.5–5.0)
Alkaline Phosphatase: 97 U/L (ref 38–126)
Anion gap: 10 (ref 5–15)
BUN: 11 mg/dL (ref 6–20)
CO2: 26 mmol/L (ref 22–32)
Calcium: 9.6 mg/dL (ref 8.9–10.3)
Chloride: 103 mmol/L (ref 98–111)
Creatinine: 0.94 mg/dL (ref 0.44–1.00)
GFR, Estimated: >60 mL/min (ref >60)
Glucose, Bld: 119 mg/dL — ABNORMAL HIGH (ref 70–99)
Potassium: 3.3 mmol/L — ABNORMAL LOW (ref 3.5–5.1)
Sodium: 139 mmol/L (ref 135–145)
Total Bilirubin: 0.5 mg/dL (ref 0.3–1.2)
Total Protein: 7.2 g/dL (ref 6.5–8.1)

## 2022-01-12 ENCOUNTER — Encounter: Payer: Self-pay | Admitting: Hematology

## 2022-01-12 MED FILL — Fosaprepitant Dimeglumine For IV Infusion 150 MG (Base Eq): INTRAVENOUS | Qty: 5 | Status: AC

## 2022-01-13 ENCOUNTER — Inpatient Hospital Stay: Payer: Federal, State, Local not specified - PPO

## 2022-01-13 ENCOUNTER — Encounter: Payer: Self-pay | Admitting: Nurse Practitioner

## 2022-01-13 ENCOUNTER — Other Ambulatory Visit: Payer: Self-pay

## 2022-01-13 ENCOUNTER — Inpatient Hospital Stay: Payer: Federal, State, Local not specified - PPO | Admitting: Dietician

## 2022-01-13 ENCOUNTER — Inpatient Hospital Stay (HOSPITAL_BASED_OUTPATIENT_CLINIC_OR_DEPARTMENT_OTHER): Payer: Federal, State, Local not specified - PPO | Admitting: Nurse Practitioner

## 2022-01-13 DIAGNOSIS — Z170 Estrogen receptor positive status [ER+]: Secondary | ICD-10-CM

## 2022-01-13 DIAGNOSIS — C50312 Malignant neoplasm of lower-inner quadrant of left female breast: Secondary | ICD-10-CM

## 2022-01-13 DIAGNOSIS — Z79899 Other long term (current) drug therapy: Secondary | ICD-10-CM | POA: Diagnosis not present

## 2022-01-13 DIAGNOSIS — D649 Anemia, unspecified: Secondary | ICD-10-CM | POA: Diagnosis not present

## 2022-01-13 DIAGNOSIS — Z5112 Encounter for antineoplastic immunotherapy: Secondary | ICD-10-CM | POA: Diagnosis not present

## 2022-01-13 DIAGNOSIS — Z5189 Encounter for other specified aftercare: Secondary | ICD-10-CM | POA: Diagnosis not present

## 2022-01-13 DIAGNOSIS — Z95828 Presence of other vascular implants and grafts: Secondary | ICD-10-CM

## 2022-01-13 DIAGNOSIS — Z5111 Encounter for antineoplastic chemotherapy: Secondary | ICD-10-CM | POA: Diagnosis not present

## 2022-01-13 DIAGNOSIS — K1379 Other lesions of oral mucosa: Secondary | ICD-10-CM | POA: Diagnosis not present

## 2022-01-13 LAB — CBC WITH DIFFERENTIAL (CANCER CENTER ONLY)
Abs Immature Granulocytes: 0.05 10*3/uL (ref 0.00–0.07)
Basophils Absolute: 0.1 10*3/uL (ref 0.0–0.1)
Basophils Relative: 1 %
Eosinophils Absolute: 0.1 10*3/uL (ref 0.0–0.5)
Eosinophils Relative: 1 %
HCT: 25.9 % — ABNORMAL LOW (ref 36.0–46.0)
Hemoglobin: 8.9 g/dL — ABNORMAL LOW (ref 12.0–15.0)
Immature Granulocytes: 1 %
Lymphocytes Relative: 22 %
Lymphs Abs: 1.4 10*3/uL (ref 0.7–4.0)
MCH: 33.2 pg (ref 26.0–34.0)
MCHC: 34.4 g/dL (ref 30.0–36.0)
MCV: 96.6 fL (ref 80.0–100.0)
Monocytes Absolute: 0.7 10*3/uL (ref 0.1–1.0)
Monocytes Relative: 11 %
Neutro Abs: 4.2 10*3/uL (ref 1.7–7.7)
Neutrophils Relative %: 64 %
Platelet Count: 423 10*3/uL — ABNORMAL HIGH (ref 150–400)
RBC: 2.68 MIL/uL — ABNORMAL LOW (ref 3.87–5.11)
RDW: 18.8 % — ABNORMAL HIGH (ref 11.5–15.5)
WBC Count: 6.6 10*3/uL (ref 4.0–10.5)
nRBC: 0 % (ref 0.0–0.2)

## 2022-01-13 LAB — CMP (CANCER CENTER ONLY)
ALT: 35 U/L (ref 0–44)
AST: 29 U/L (ref 15–41)
Albumin: 4.2 g/dL (ref 3.5–5.0)
Alkaline Phosphatase: 85 U/L (ref 38–126)
Anion gap: 8 (ref 5–15)
BUN: 12 mg/dL (ref 6–20)
CO2: 27 mmol/L (ref 22–32)
Calcium: 9.8 mg/dL (ref 8.9–10.3)
Chloride: 105 mmol/L (ref 98–111)
Creatinine: 0.92 mg/dL (ref 0.44–1.00)
GFR, Estimated: >60 mL/min (ref >60)
Glucose, Bld: 134 mg/dL — ABNORMAL HIGH (ref 70–99)
Potassium: 3.6 mmol/L (ref 3.5–5.1)
Sodium: 140 mmol/L (ref 135–145)
Total Bilirubin: 0.4 mg/dL (ref 0.3–1.2)
Total Protein: 7.6 g/dL (ref 6.5–8.1)

## 2022-01-13 MED ORDER — SODIUM CHLORIDE 0.9 % IV SOLN
150.0000 mg | Freq: Once | INTRAVENOUS | Status: AC
  Administered 2022-01-13: 150 mg via INTRAVENOUS
  Filled 2022-01-13: qty 150

## 2022-01-13 MED ORDER — SODIUM CHLORIDE 0.9 % IV SOLN
200.0000 mg | Freq: Once | INTRAVENOUS | Status: AC
  Administered 2022-01-13: 200 mg via INTRAVENOUS
  Filled 2022-01-13: qty 200

## 2022-01-13 MED ORDER — DIPHENHYDRAMINE HCL 50 MG/ML IJ SOLN
50.0000 mg | Freq: Once | INTRAMUSCULAR | Status: AC
  Administered 2022-01-13: 50 mg via INTRAVENOUS
  Filled 2022-01-13: qty 1

## 2022-01-13 MED ORDER — SODIUM CHLORIDE 0.9 % IV SOLN
600.0000 mg/m2 | Freq: Once | INTRAVENOUS | Status: AC
  Administered 2022-01-13: 1220 mg via INTRAVENOUS
  Filled 2022-01-13: qty 61

## 2022-01-13 MED ORDER — SODIUM CHLORIDE 0.9 % IV SOLN: Freq: Once | INTRAVENOUS | Status: AC

## 2022-01-13 MED ORDER — PALONOSETRON HCL INJECTION 0.25 MG/5ML
0.2500 mg | Freq: Once | INTRAVENOUS | Status: AC
  Administered 2022-01-13: 0.25 mg via INTRAVENOUS
  Filled 2022-01-13: qty 5

## 2022-01-13 MED ORDER — LIDOCAINE-PRILOCAINE 2.5-2.5 % EX CREA: TOPICAL_CREAM | CUTANEOUS | 3 refills | Status: DC

## 2022-01-13 MED ORDER — EPIRUBICIN HCL CHEMO IV INJECTION 200 MG/100ML
99.5000 mg/m2 | Freq: Once | INTRAVENOUS | Status: AC
  Administered 2022-01-13: 200 mg via INTRAVENOUS
  Filled 2022-01-13: qty 100

## 2022-01-13 MED ORDER — HEPARIN SOD (PORK) LOCK FLUSH 100 UNIT/ML IV SOLN
500.0000 [IU] | Freq: Once | INTRAVENOUS | Status: AC | PRN
  Administered 2022-01-13: 500 [IU]

## 2022-01-13 MED ORDER — FAMOTIDINE IN NACL 20-0.9 MG/50ML-% IV SOLN
20.0000 mg | Freq: Once | INTRAVENOUS | Status: AC
  Administered 2022-01-13: 20 mg via INTRAVENOUS
  Filled 2022-01-13: qty 50

## 2022-01-13 MED ORDER — SODIUM CHLORIDE 0.9% FLUSH
10.0000 mL | INTRAVENOUS | Status: DC | PRN
  Administered 2022-01-13: 10 mL

## 2022-01-13 MED ORDER — SODIUM CHLORIDE 0.9% FLUSH
10.0000 mL | Freq: Once | INTRAVENOUS | Status: AC
  Administered 2022-01-13: 10 mL

## 2022-01-13 NOTE — Progress Notes (Signed)
Nutrition Assessment   Reason for Assessment: MST   ASSESSMENT: 41 year old female with breast cancer, estrogen receptor positive. She is currently receiving Epirubican, Cytoxan q3w (started 12/02/21) plus Keytruda q3w (started 09/09/21). Patient is under the care of Dr. Burr Medico.   Past medical history includes anxiety, DVT, RBBB, anal fissure  Met with patient in infusion. She reports poor appetite. Food has "no taste" and her mouth stays dry. Patient is drinking 2-3 bottles of water. She is brushing 3 times daily plus biotene. This has not been helpful. Patient reports unable to eat chips or crackers as these "ball up" in her mouth. She does not experience this with breads. Yesterday she ate cereal with almond milk, cup of coffee with cream/sugar for breakfast, few bites of frozen mac/cheese meal. This tasted horrible. She then ate 8 plain meatballs. Patient likes these. Patient had ~half of chicken quesadilla and strawberry lemonade from [employer] for dinner. She drinks Ensure sometimes. Patient denies constipation, diarrhea, vomiting. She reports occasional nausea. Patient takes anti-emetics as needed.     Nutrition Focused Physical Exam: deferred    Medications: xanax, flexeril, MVI, zofran, klor-con, oxycodone, compazine, tramadol  Labs: glucose 134   Anthropometrics: Weights have decreased 12 lbs (6%) in 6 weeks; significant.   Height: 5'2" Weight: 192 lb 3.2 oz  UBW: 204 lb 14.4 oz (12/01/21) BMI: 35.15   NUTRITION DIAGNOSIS: Unintentional weight loss related to cancer treatment side effects as evidenced by reported dry mouth, altered taste of food, 6% wt loss in 6 weeks; significant   INTERVENTION:  Educated on importance of adequate calorie and protein energy intake to maintain strength/weights Educated on strategies for altered taste - handout with tips provided Suggested baking soda salt water rinses several times daily before meal times - recipe provided Discussed strategies  for dry mouth - handout with tips provided Educated on ways to increase calories and protein (sub Ensure vs creamer in coffee, using regular milk in cereal, adding cheese) Encouraged small frequent meals and snacks vs 3 meals/day - handout with ideas + plus list of high protein foods provided  Recommend drinking one Ensure Plus/equivalent daily - coupons provided Contact information given   MONITORING, EVALUATION, GOAL: Patient will tolerate increased calories and protein to minimize weight loss   Next Visit: To be scheduled with treatment as needed

## 2022-01-13 NOTE — Progress Notes (Signed)
[place]   [phone] [phone]   Clinic Follow up Note   Patient Care Team: Billie Ruddy, MD as PCP - General (Family Medicine) Erroll Luna, MD as Consulting Physician (General Surgery) Truitt Merle, MD as Consulting Physician (Hematology) Gery Pray, MD as Consulting Physician (Radiation Oncology) 01/13/2022  CHIEF COMPLAINT: Follow-up left breast cancer  SUMMARY OF ONCOLOGIC HISTORY: Oncology History Overview Note   Cancer Staging  Malignant neoplasm of lower-inner quadrant of left breast in female, estrogen receptor positive ([place]) Staging form: Breast, AJCC 8th Edition - Clinical stage from 08/07/2021: Stage IIIA (cT2, cN1, cM0, G3, ER+, PR-, HER2-) - Signed by Truitt Merle, MD on 08/18/2021    Malignant neoplasm of lower-inner quadrant of left breast in female, estrogen receptor positive ([place])  08/07/2021 Cancer Staging   Staging form: Breast, AJCC 8th Edition - Clinical stage from 08/07/2021: Stage IIB (cT2, cN0, cM0, G3, ER-, PR-, HER2-) - Signed by Truitt Merle, MD on 09/24/2021 Stage prefix: Initial diagnosis Histologic grading system: 3 grade system   08/07/2021 Mammogram   CLINICAL DATA:  Palpable mass in the LEFT breast since August.   EXAM: DIGITAL DIAGNOSTIC BILATERAL MAMMOGRAM WITH TOMOSYNTHESIS AND CAD; ULTRASOUND LEFT BREAST LIMITED  IMPRESSION: 1. Suspicious mass in the 7:30 o'clock location of the LEFT breast for which biopsy is recommended. 2. LEFT axillary adenopathy, with at least 4 abnormal lymph nodes.   08/07/2021 Initial Biopsy   Diagnosis 1. Breast, left, needle core biopsy, 7:30, ribbon clip - INVASIVE DUCTAL CARCINOMA - SEE COMMENT 2. Lymph node, needle/core biopsy, left axilla, coil clip - NO CARCINOMA IDENTIFIED Microscopic Comment 1. based on the biopsy, the carcinoma appears Nottingham grade 3 of 3 and measures 1.1 cm in greatest linear extent.  1. PROGNOSTIC INDICATORS Results: The tumor cells are  EQUIVOCAL for Her2 (2+). Her2 by FISH will be performed and results reported separately. Estrogen Receptor: 2%, POSITIVE, WEAK STAINING INTENSITY Progesterone Receptor: <1%, NEGATIVE Proliferation Marker Ki67: 40%  1. FLUORESCENCE IN-SITU HYBRIDIZATION Results: GROUP 5: HER2 **NEGATIVE**   08/17/2021 Initial Diagnosis   Malignant neoplasm of lower-inner quadrant of left breast in female, estrogen receptor positive ([place])   08/19/2021 Genetic Testing   [facility] CancerNext-Expanded Panel is Negative. Report date is 09/02/2021.  The CancerNext-Expanded gene panel offered by [facility] and includes sequencing, rearrangement, and RNA analysis for the following 77 genes: AIP, ALK, APC, ATM, AXIN2, BAP1, BARD1, BLM, BMPR1A, BRCA1, BRCA2, BRIP1, CDC73, CDH1, CDK4, CDKN1B, CDKN2A, CHEK2, CTNNA1, DICER1, FANCC, FH, FLCN, GALNT12, KIF1B, LZTR1, MAX, MEN1, MET, MLH1, MSH2, MSH3, MSH6, MUTYH, NBN, NF1, NF2, NTHL1, PALB2, PHOX2B, PMS2, POT1, PRKAR1A, PTCH1, PTEN, RAD51C, RAD51D, RB1, RECQL, RET, SDHA, SDHAF2, SDHB, SDHC, SDHD, SMAD4, SMARCA4, SMARCB1, SMARCE1, STK11, SUFU, TMEM127, TP53, TSC1, TSC2, VHL and XRCC2 (sequencing and deletion/duplication); EGFR, EGLN1, HOXB13, KIT, MITF, PDGFRA, POLD1, and POLE (sequencing only); EPCAM and GREM1 (deletion/duplication only).    08/26/2021 Imaging   EXAM: BILATERAL BREAST MRI WITH AND WITHOUT CONTRAST  IMPRESSION: 2.3 cm mass in the lower-inner quadrant of the left breast corresponding with the biopsy proven invasive ductal carcinoma.   08/31/2021 PET scan   IMPRESSION: 1. Known mass medially in the left breast is markedly hypermetabolic consistent with breast cancer. 2. No evidence of nodal metastases within the chest. No typical distant metastases. 3. Suspected acute inflammatory process involving central mesentery and pelvis with ill-defined focal hypermetabolic activity anterior to the sacrum. There are pelvic inflammatory changes and free pelvic fluid.  Recent labs demonstrate mild  leukocytosis. Differential considerations include acute appendicitis, enteritis and small perforated foreign body. No evidence of drainable fluid collection, pneumoperitoneum or bowel obstruction. 4. These results were called by telephone at the time of interpretation on 08/31/2021 at 5:12 pm to on-call oncology nurse, [name], who verbally acknowledged these results. Unless there is a good clinical explanation for these findings, evaluation in the emergency department is likely warranted, and patient may benefit from abdominopelvic CT with oral and intravenous contrast.   09/01/2021 Imaging   EXAM: CT ABDOMEN AND PELVIS WITH CONTRAST  IMPRESSION: Multiple thick-walled loops of distal small bowel, suggesting infectious/inflammatory enteritis. Associated small volume pelvic ascites. No pneumatosis or free air.   No evidence of bowel obstruction.  Normal appendix.   2.1 cm lesion in the medial left breast, likely corresponding to the patient's known primary breast neoplasm. No findings suspicious for metastatic disease.   09/09/2021 -  Chemotherapy   Patient is on Treatment Plan : BREAST Pembrolizumab (200) D1 + Carboplatin (5) D1 + Paclitaxel (80) D1,8,15 q21d X 4 cycles / Pembrolizumab (200) D1 + AC D1 q21d x 4 cycles       CURRENT THERAPY: Neoadjuvant chemo -S/p carbo/Taxol 09/09/2021 - 11/25/2021 followed by Adriamycin and Cytoxan, q3weeks, starting 12/02/21; Adriamycin changed to epirubicin due to allergic reaction 8-9 hours after infusion including facial swelling and jaw and throat tightening required epinephrine, steroids, and antihistamines in the ED -Beryle Flock, q3weeks, starting 09/09/21  INTERVAL HISTORY: Ms. Cogan returns for follow-up and treatment as scheduled, last seen by Dr. Burr Medico 12/23/2021 and completed cycle 2 Cytoxan, Adria was changed to epirubicin, and she continues pembrolizumab.  She did better with last cycle, less fatigue.  She felt the lymph  nodes near her jaw tenses up but not like with cycle 1, she took a Benadryl and this resolved.  Denies throat tightness, dyspnea, or any other signs of allergic reaction.  She woke up with tingling in the right hand once, this resolved when she was on higher dose of potassium, now back to baseline dose.  She has dry mouth and no taste but no mouth sores.  Bowels moving well.  She has intermittent bilateral breast pains but does not feel recurrent mass.  Otherwise doing well, denies nausea/vomiting, fever, chills, cough, persistent neuropathy, or any other new specific complaints.   MEDICAL HISTORY:  Past Medical History:  Diagnosis Date   Anal fissure    Anxiety    Breast cancer ([place]) 08/10/21   Date diagnosis was given   DVT (deep venous thrombosis) ([place])    Incomplete right bundle branch block (RBBB) determined by electrocardiography 09/01/2021    SURGICAL HISTORY: Past Surgical History:  Procedure Laterality Date   NO PAST SURGERIES     PORTACATH PLACEMENT N/A 09/08/2021   Procedure: INSERTION PORT-A-CATH;  Surgeon: Erroll Luna, MD;  Location: [facility];  Service: General;  Laterality: N/A;    I have reviewed the social history and family history with the patient and they are unchanged from previous note.  ALLERGIES:  has No Known Allergies.  MEDICATIONS:  Current Outpatient Medications  Medication Sig Dispense Refill   acetaminophen (TYLENOL) 500 MG tablet Take 500-1,000 mg by mouth every 6 (six) hours as needed (pain.).     albuterol (VENTOLIN HFA) 108 (90 Base) MCG/ACT inhaler Inhale 2 puffs into the lungs every 4 (four) hours as needed for wheezing. 1 each 5   ALPRAZolam (XANAX) 0.5 MG tablet Take one tablet twice daily as needed for anxiety. 45 tablet 2  cyclobenzaprine (FLEXERIL) 10 MG tablet Take 10 mg by mouth daily as needed for muscle spasms.     desonide (DESOWEN) 0.05 % cream Apply topically 2 (two) times daily. (Patient taking differently: Apply 1 application. topically  2 (two) times daily as needed (skin irritation.).) 30 g 1   desonide (DESOWEN) 0.05 % lotion Apply 1 application. topically daily as needed (acne).     EPINEPHrine 0.3 mg/0.3 mL IJ SOAJ injection Inject 0.3 mg into the muscle as needed for anaphylaxis. 1 each 0   hydroquinone 4 % cream APPLY TO DARK SPOTS AT BEDTIME (Patient taking differently: Apply 1 application. topically daily as needed (dark spots). APPLY TO DARK SPOTS AT BEDTIME) 28.35 g 0   ibuprofen (ADVIL) 800 MG tablet Take 1 tablet (800 mg total) by mouth every 8 (eight) hours as needed. 30 tablet 0   KLOR-CON M20 20 MEQ tablet TAKE 1 TABLET BY MOUTH TWICE A DAY 60 tablet 0   lidocaine-prilocaine (EMLA) cream Apply to affected area once 30 g 3   loratadine (CLARITIN) 10 MG tablet Take 10 mg by mouth daily.     Multiple Vitamins-Minerals (ONE A DAY IMMUNITY DEFENSE) CHEW Chew 1 tablet by mouth daily.     ondansetron (ZOFRAN) 8 MG tablet Take 1 tablet (8 mg total) by mouth 2 (two) times daily as needed. Start on the third day after carboplatin and AC chemotherapy. 30 tablet 1   oxyCODONE (OXY IR/ROXICODONE) 5 MG immediate release tablet Take 1 tablet (5 mg total) by mouth every 6 (six) hours as needed for severe pain. (Patient not taking: Reported on 12/02/2021) 15 tablet 0   prochlorperazine (COMPAZINE) 10 MG tablet Take 1 tablet (10 mg total) by mouth every 6 (six) hours as needed (Nausea or vomiting). 30 tablet 1   traMADol (ULTRAM) 50 MG tablet Take 1 tablet (50 mg total) by mouth every 6 (six) hours as needed. (Patient taking differently: Take 50 mg by mouth every 6 (six) hours as needed for moderate pain.) 5 tablet 0   No current facility-administered medications for this visit.   Facility-Administered Medications Ordered in Other Visits  Medication Dose Route Frequency Provider Last Rate Last Admin   cyclophosphamide (CYTOXAN) 1,220 mg in sodium chloride 0.9 % 250 mL chemo infusion  600 mg/m2 (Treatment Plan Recorded) Intravenous Once  Truitt Merle, MD       diphenhydrAMINE (BENADRYL) injection 50 mg  50 mg Intravenous Once Truitt Merle, MD       epirubicin [facility]) chemo injection 200 mg  99.5 mg/m2 (Treatment Plan Recorded) Intravenous Once Truitt Merle, MD       famotidine (PEPCID) IVPB 20 mg premix  20 mg Intravenous Once Truitt Merle, MD       fosaprepitant (EMEND) 150 mg in sodium chloride 0.9 % 145 mL IVPB  150 mg Intravenous Once Truitt Merle, MD       heparin lock flush 100 unit/mL  500 Units Intracatheter Once PRN Truitt Merle, MD       palonosetron (ALOXI) injection 0.25 mg  0.25 mg Intravenous Once Truitt Merle, MD       pembrolizumab [facility]) 200 mg in sodium chloride 0.9 % 50 mL chemo infusion  200 mg Intravenous Once Truitt Merle, MD       sodium chloride flush (NS) 0.9 % injection 10 mL  10 mL Intracatheter PRN Truitt Merle, MD        PHYSICAL EXAMINATION: ECOG PERFORMANCE STATUS: 1 - Symptomatic but completely ambulatory  Vitals:  01/13/22 0911  BP: 119/67  Pulse: 98  Resp: 17  Temp: 98.5 F (36.9 C)  SpO2: 100%   Filed Weights   01/13/22 0911  Weight: 192 lb 3.2 oz (87.2 kg)    GENERAL:alert, no distress and comfortable SKIN: No rash LYMPH:  no palpable [place] auricular, cervical, or supraclavicular lymphadenopathy  LUNGS:  normal breathing effort HEART: no lower extremity edema NEURO: alert & oriented x 3 with fluent speech, no focal motor/sensory deficits Breast exam: No bilateral nipple discharge or inversion.  No palpable mass in either breast or axilla that I could appreciate. PAC without erythema  LABORATORY DATA:  I have reviewed the data as listed    Latest Ref Rng & Units 01/13/2022    8:30 AM 01/01/2022    8:30 AM 12/23/2021    8:19 AM  CBC  WBC 4.0 - 10.5 K/uL 6.6  3.9  6.3   Hemoglobin 12.0 - 15.0 g/dL 8.9  8.5  9.8   Hematocrit 36.0 - 46.0 % 25.9  24.6  28.9   Platelets 150 - 400 K/uL 423  151  421         Latest Ref Rng & Units 01/13/2022    8:30 AM 01/01/2022    8:30 AM 12/23/2021    8:19 AM   CMP  Glucose 70 - 99 mg/dL 134  119  138   BUN 6 - 20 mg/dL _0 Creatinine 0.44 - 1.00 mg/dL 0.92  0.94  1.06   Sodium 135 - 145 mmol/L 140  139  139   Potassium 3.5 - 5.1 mmol/L 3.6  3.3  3.6   Chloride 98 - 111 mmol/L 105  103  103   CO2 22 - 32 mmol/L _1 Calcium 8.9 - 10.3 mg/dL 9.8  9.6  9.5   Total Protein 6.5 - 8.1 g/dL 7.6  7.2  7.6   Total Bilirubin 0.3 - 1.2 mg/dL 0.4  0.5  0.5   Alkaline Phos 38 - 126 U/L 85  97  84   AST 15 - 41 U/L _2 ALT 0 - 44 U/L 35  16  24       RADIOGRAPHIC STUDIES: I have personally reviewed the radiological images as listed and agreed with the findings in the report. No results found.   ASSESSMENT & PLAN: Cheyenne [name] is a 41 y.o. female with    1. Malignant neoplasm of lower-inner quadrant of left breast, Stage IIIA, c(T2, N1), Functionally triple negative, Grade 3 -presented with palpable left breast mass. B/l MM and left [place] on 08/07/21 showed: 2.8 cm left breast mass at 7:30; at least 4 abnormal lymph nodes. Biopsy that day confirmed IDC in breast but node was negative. -breast MRI on 08/26/21 and PET on 08/31/21 were negative aside from primary mass. -Her breast cancer is ER 2% positive, PR and HER2 negative, this is similar to triple negative breast cancer.   -port placed 09/08/21 -she began neoadjuvant weekly Taxol on days 1, 8, 15, and carbo on day 1 q. 21 days with PEG filgrastim on days 16, 17, and 18 and q3 week [place] on 09/09/21 and completed 11/25/2021. -The left breast mass was not palpable from cycle 3 -She moved to Adriamycin and Cytoxan every 3 weeks, Adria switched to epirubicin with cycle 2 due to allergic reaction.   -Ms. Hairston appears stable.  S/p cycle 2 Cytoxan/epirubicin and  continues pembrolizumab every 3 weeks.  She tolerated cycle 2 better with less fatigue, no recurrent allergic reaction.  Side effects are well managed with supportive care at home.  She is able to recover and function  well. -The left breast mass remains not palpable consistent with a sustained response to neoadjuvant chemo -Labs reviewed, adequate to proceed with cycle 3 Cytoxan/epirubicin and continue Pembro today as planned. -Return for final cycle 4 and Pembro 7/5.  We will arrange posttreatment breast MRI after that -He knows to call sooner with new or worsening side effects or recurrent symptomatic anemia   2.  Chemo SE's: fatigue, exertional dyspnea, epistaxis, R hand pain, symptomatic anemia -reported at C3 day 1 -I encouraged her to continue to be active/mobile. No hypoxia. Lungs clear -epistaxis: avoid heavy nose blowing, use humidifier. Plt normal today -R hand pain: could be bone/joint aches from taxol vs early neuropathy. Resolved with support glove.  She had tingling when she woke up 1 morning recently, persistent neuropathy -She has required blood transfusion periodically for symptomatic anemia    3.  Facial skin rash -She developed dry skin with burning and rash on her face recently, saw dermatology -Desonide cream is on her med list, not taking frequently -I cautioned her against use of oral/systemic steroids on immunotherapy which would counteract its efficacy -No other evidence of skin rash -Resolved   4. Social Support -she has good support from her wife, Eathel Pajak. -she has a history of anxiety and is on Xanax as needed.  -Follow-up social work as needed   5. Transaminitis -She was noticed to have AST 90 and ALT 174 on labs 09/09/2021 on the day she started treatment, previously normal -LFTs are normal today -Resolved   Plan: -Labs reviewed -Proceed with cycle 3 of Cytoxan/epirubicin continue pembrolizumab today as planned -Follow-up and final cycle 4 on 7/5  -Continue Pembro every 3 weeks to total 1 year -Posttreatment MRI to be done in 5-6 weeks -msg to Dr. Brantley Stage and breast team re: treatment/surgery timeline     Orders Placed This Encounter  Procedures   MR  BREAST BILATERAL [place] CAD    Standing Status:   Future    Standing Expiration Date:   01/14/2023    Order Specific Question:   If indicated for the ordered procedure, I authorize the administration of contrast media per Radiology protocol    Answer:   Yes    Order Specific Question:   What is the patient's sedation requirement?    Answer:   No Sedation    Order Specific Question:   Does the patient have a pacemaker or implanted devices?    Answer:   No    Order Specific Question:   Preferred imaging location?    Answer:   [facility] (table limit - 550 lbs)   All questions were answered. The patient knows to call the clinic with any problems, questions or concerns. No barriers to learning were detected.      Alla Feeling, NP 01/13/22

## 2022-01-13 NOTE — Patient Instructions (Signed)
  [facility]  Discharge Instructions: Thank you for choosing [facility] to provide your oncology and hematology care.   If you have a lab appointment with the [facility], please go directly to the [facility] and check in at the registration area.   Wear comfortable clothing and clothing appropriate for easy access to any Portacath or PICC line.   We strive to give you quality time with your provider. You may need to reschedule your appointment if you arrive late (15 or more minutes).  Arriving late affects you and other patients whose appointments are after yours.  Also, if you miss three or more appointments without notifying the office, you may be dismissed from the clinic at the provider's discretion.      For prescription refill requests, have your pharmacy contact our office and allow 72 hours for refills to be completed.    Today you received the following chemotherapy and/or immunotherapy agents; Keytruda, Cytoxan, and Epirubicin      To help prevent nausea and vomiting after your treatment, we encourage you to take your nausea medication as directed.  BELOW ARE SYMPTOMS THAT SHOULD BE REPORTED IMMEDIATELY: *FEVER GREATER THAN 100.4 F (38 C) OR HIGHER *CHILLS OR SWEATING *NAUSEA AND VOMITING THAT IS NOT CONTROLLED WITH YOUR NAUSEA MEDICATION *UNUSUAL SHORTNESS OF BREATH *UNUSUAL BRUISING OR BLEEDING *URINARY PROBLEMS (pain or burning when urinating, or frequent urination) *BOWEL PROBLEMS (unusual diarrhea, constipation, pain near the anus) TENDERNESS IN MOUTH AND THROAT WITH OR WITHOUT PRESENCE OF ULCERS (sore throat, sores in mouth, or a toothache) UNUSUAL RASH, SWELLING OR PAIN  UNUSUAL VAGINAL DISCHARGE OR ITCHING   Items with * indicate a potential emergency and should be followed up as soon as possible or go to the Emergency Department if any problems should occur.  Please show the CHEMOTHERAPY ALERT CARD or IMMUNOTHERAPY  ALERT CARD at check-in to the Emergency Department and triage nurse.  Should you have questions after your visit or need to cancel or reschedule your appointment, please contact [facility]  Dept: [phone]  and follow the prompts.  Office hours are 8:00 a.m. to 4:30 p.m. Monday - Friday. Please note that voicemails left after 4:00 p.m. may not be returned until the following business day.  We are closed weekends and major holidays. You have access to a nurse at all times for urgent questions. Please call the main number to the clinic Dept: [phone] and follow the prompts.   For any non-urgent questions, you may also contact your provider using MyChart. We now offer e-Visits for anyone 51 and older to request care online for non-urgent symptoms. For details visit [email or web address].   Also download the MyChart app! Go to the app store, search "MyChart", open the app, select [facility], and log in with your MyChart username and password.  Masks are optional in the cancer centers. If you would like for your care team to wear a mask while they are taking care of you, please let them know. For doctor visits, patients may have with them one support Mayan Dolney who is at least 40 years old. At this time, visitors are not allowed in the infusion area.

## 2022-01-15 ENCOUNTER — Inpatient Hospital Stay: Payer: Federal, State, Local not specified - PPO

## 2022-01-15 ENCOUNTER — Other Ambulatory Visit: Payer: Self-pay

## 2022-01-15 ENCOUNTER — Telehealth: Payer: Self-pay | Admitting: Hematology

## 2022-01-15 ENCOUNTER — Encounter: Payer: Self-pay | Admitting: *Deleted

## 2022-01-15 DIAGNOSIS — Z5111 Encounter for antineoplastic chemotherapy: Secondary | ICD-10-CM | POA: Diagnosis not present

## 2022-01-15 DIAGNOSIS — Z5112 Encounter for antineoplastic immunotherapy: Secondary | ICD-10-CM | POA: Diagnosis not present

## 2022-01-15 DIAGNOSIS — C50312 Malignant neoplasm of lower-inner quadrant of left female breast: Secondary | ICD-10-CM | POA: Diagnosis not present

## 2022-01-15 DIAGNOSIS — Z79899 Other long term (current) drug therapy: Secondary | ICD-10-CM | POA: Diagnosis not present

## 2022-01-15 DIAGNOSIS — K1379 Other lesions of oral mucosa: Secondary | ICD-10-CM | POA: Diagnosis not present

## 2022-01-15 DIAGNOSIS — Z170 Estrogen receptor positive status [ER+]: Secondary | ICD-10-CM

## 2022-01-15 DIAGNOSIS — Z5189 Encounter for other specified aftercare: Secondary | ICD-10-CM | POA: Diagnosis not present

## 2022-01-15 DIAGNOSIS — D649 Anemia, unspecified: Secondary | ICD-10-CM | POA: Diagnosis not present

## 2022-01-15 MED ORDER — PEGFILGRASTIM-CBQV 6 MG/0.6ML ~~LOC~~ SOSY
6.0000 mg | PREFILLED_SYRINGE | Freq: Once | SUBCUTANEOUS | Status: AC
  Administered 2022-01-15: 6 mg via SUBCUTANEOUS
  Filled 2022-01-15: qty 0.6

## 2022-01-15 NOTE — Telephone Encounter (Signed)
Sent patient a message with upcoming appointment information

## 2022-01-16 ENCOUNTER — Other Ambulatory Visit: Payer: Self-pay | Admitting: Nurse Practitioner

## 2022-01-25 ENCOUNTER — Encounter: Payer: Self-pay | Admitting: Hematology

## 2022-01-27 ENCOUNTER — Encounter: Payer: Self-pay | Admitting: Hematology

## 2022-01-27 ENCOUNTER — Other Ambulatory Visit: Payer: Self-pay

## 2022-01-27 ENCOUNTER — Telehealth: Payer: Self-pay | Admitting: Hematology

## 2022-01-27 ENCOUNTER — Telehealth: Payer: Self-pay

## 2022-01-27 ENCOUNTER — Other Ambulatory Visit (HOSPITAL_COMMUNITY): Payer: Self-pay

## 2022-01-27 DIAGNOSIS — C50312 Malignant neoplasm of lower-inner quadrant of left female breast: Secondary | ICD-10-CM

## 2022-01-27 DIAGNOSIS — D649 Anemia, unspecified: Secondary | ICD-10-CM

## 2022-01-27 MED ORDER — NYSTATIN 100000 UNIT/ML MT SUSP
5.0000 mL | Freq: Three times a day (TID) | OROMUCOSAL | 1 refills | Status: DC | PRN
  Filled 2022-01-27: qty 140, 9d supply, fill #0
  Filled 2022-02-18: qty 140, 9d supply, fill #1

## 2022-01-27 NOTE — Telephone Encounter (Signed)
Rescheduled upcoming appointment per 6/27 patient advice request message. Patient is aware of changes.

## 2022-01-27 NOTE — Telephone Encounter (Signed)
Spoke with pt via telephone regarding her symptoms involving her mouth.  Pt stated she's experiencing significant bleeding from her gum when she brushes her teeth.  Pt c/o pain in the posterior part of her mouth and tongue.  Pt denied white patches within the mouth or on the tongue.  Informed pt that Dr. Burr Medico has called in [place] to [facility] for her to pickup today and start using immediately.  Pt is scheduled to see Dr. Burr Medico on Friday 01/29/2022 along with a lab appt.  Pt feels her counts are low again.  Pt is scheduled for chemo on 02/03/2022.  Pt had no further questions or concerns at this time.

## 2022-01-28 ENCOUNTER — Other Ambulatory Visit: Payer: Self-pay | Admitting: Hematology

## 2022-01-28 ENCOUNTER — Other Ambulatory Visit (HOSPITAL_COMMUNITY): Payer: Self-pay

## 2022-01-28 DIAGNOSIS — C50312 Malignant neoplasm of lower-inner quadrant of left female breast: Secondary | ICD-10-CM

## 2022-01-29 ENCOUNTER — Other Ambulatory Visit: Payer: Self-pay

## 2022-01-29 ENCOUNTER — Inpatient Hospital Stay: Payer: Federal, State, Local not specified - PPO

## 2022-01-29 ENCOUNTER — Encounter: Payer: Self-pay | Admitting: Hematology

## 2022-01-29 ENCOUNTER — Other Ambulatory Visit (HOSPITAL_COMMUNITY): Payer: Self-pay

## 2022-01-29 ENCOUNTER — Inpatient Hospital Stay (HOSPITAL_BASED_OUTPATIENT_CLINIC_OR_DEPARTMENT_OTHER): Payer: Federal, State, Local not specified - PPO | Admitting: Hematology

## 2022-01-29 VITALS — BP 118/73 | HR 84 | Temp 98.2°F | Resp 17 | Wt 187.8 lb

## 2022-01-29 DIAGNOSIS — Z5111 Encounter for antineoplastic chemotherapy: Secondary | ICD-10-CM | POA: Diagnosis not present

## 2022-01-29 DIAGNOSIS — C50312 Malignant neoplasm of lower-inner quadrant of left female breast: Secondary | ICD-10-CM | POA: Diagnosis not present

## 2022-01-29 DIAGNOSIS — Z170 Estrogen receptor positive status [ER+]: Secondary | ICD-10-CM

## 2022-01-29 DIAGNOSIS — K1379 Other lesions of oral mucosa: Secondary | ICD-10-CM | POA: Diagnosis not present

## 2022-01-29 DIAGNOSIS — Z79899 Other long term (current) drug therapy: Secondary | ICD-10-CM | POA: Diagnosis not present

## 2022-01-29 DIAGNOSIS — Z5189 Encounter for other specified aftercare: Secondary | ICD-10-CM | POA: Diagnosis not present

## 2022-01-29 DIAGNOSIS — D649 Anemia, unspecified: Secondary | ICD-10-CM

## 2022-01-29 DIAGNOSIS — Z5112 Encounter for antineoplastic immunotherapy: Secondary | ICD-10-CM | POA: Diagnosis not present

## 2022-01-29 LAB — CBC WITH DIFFERENTIAL (CANCER CENTER ONLY)
Abs Immature Granulocytes: 0.06 10*3/uL (ref 0.00–0.07)
Basophils Absolute: 0 10*3/uL (ref 0.0–0.1)
Basophils Relative: 1 %
Eosinophils Absolute: 0.1 10*3/uL (ref 0.0–0.5)
Eosinophils Relative: 2 %
HCT: 23.1 % — ABNORMAL LOW (ref 36.0–46.0)
Hemoglobin: 7.7 g/dL — ABNORMAL LOW (ref 12.0–15.0)
Immature Granulocytes: 1 %
Lymphocytes Relative: 19 %
Lymphs Abs: 1.2 10*3/uL (ref 0.7–4.0)
MCH: 32.5 pg (ref 26.0–34.0)
MCHC: 33.3 g/dL (ref 30.0–36.0)
MCV: 97.5 fL (ref 80.0–100.0)
Monocytes Absolute: 0.7 10*3/uL (ref 0.1–1.0)
Monocytes Relative: 11 %
Neutro Abs: 4.1 10*3/uL (ref 1.7–7.7)
Neutrophils Relative %: 66 %
Platelet Count: 223 10*3/uL (ref 150–400)
RBC: 2.37 MIL/uL — ABNORMAL LOW (ref 3.87–5.11)
RDW: 18.6 % — ABNORMAL HIGH (ref 11.5–15.5)
WBC Count: 6.1 10*3/uL (ref 4.0–10.5)
nRBC: 0 % (ref 0.0–0.2)

## 2022-01-29 LAB — SAMPLE TO BLOOD BANK

## 2022-01-29 LAB — CMP (CANCER CENTER ONLY)
ALT: 19 U/L (ref 0–44)
AST: 21 U/L (ref 15–41)
Albumin: 4.2 g/dL (ref 3.5–5.0)
Alkaline Phosphatase: 86 U/L (ref 38–126)
Anion gap: 9 (ref 5–15)
BUN: 12 mg/dL (ref 6–20)
CO2: 26 mmol/L (ref 22–32)
Calcium: 9.7 mg/dL (ref 8.9–10.3)
Chloride: 105 mmol/L (ref 98–111)
Creatinine: 0.92 mg/dL (ref 0.44–1.00)
GFR, Estimated: >60 mL/min (ref >60)
Glucose, Bld: 109 mg/dL — ABNORMAL HIGH (ref 70–99)
Potassium: 3.7 mmol/L (ref 3.5–5.1)
Sodium: 140 mmol/L (ref 135–145)
Total Bilirubin: 0.4 mg/dL (ref 0.3–1.2)
Total Protein: 7.5 g/dL (ref 6.5–8.1)

## 2022-01-29 LAB — PREPARE RBC (CROSSMATCH)

## 2022-01-29 LAB — TSH: TSH: 2.469 u[IU]/mL (ref 0.350–4.500)

## 2022-01-29 LAB — T4, FREE: Free T4: 0.9 ng/dL (ref 0.61–1.12)

## 2022-01-29 NOTE — Progress Notes (Addendum)
[place]   [phone]) [phone] [phone]) [phone]   Clinic Follow up Note   Patient Care Team: Billie Ruddy, MD as PCP - General (Family Medicine) Erroll Luna, MD as Consulting Physician (General Surgery) Truitt Merle, MD as Consulting Physician (Hematology) Gery Pray, MD as Consulting Physician (Radiation Oncology)  Date of Service:  01/29/2022  CHIEF COMPLAINT: f/u of left breast cancer  CURRENT THERAPY:  Neoadjuvant Cytoxan and Epirubicin, q3weeks, starting 12/02/21 Beryle Flock, q3weeks, starting 09/09/21  ASSESSMENT & PLAN:  Cheyenne [name] is a 41 y.o. pre-menopausal female with   1. Malignant neoplasm of lower-inner quadrant of left breast, Stage IIIA, c(T2, N1), Functionally triple negative, Grade 3 -presented with palpable left breast mass. B/l MM and left [place] on 08/07/21 showed: 2.8 cm left breast mass at 7:30; at least 4 abnormal lymph nodes. Biopsy that day confirmed IDC in breast but node was negative. -breast MRI on 08/26/21 and PET on 08/31/21 were negative aside from primary mass. -Her breast cancer is ER 2% positive, PR and HER2 negative, this is similar to triple negative breast cancer.   -port placed 09/08/21 -she received neoadjuvant carbo/taxol and keytruda on 09/09/21 - 11/25/21; her breast mass became no longer palpable after cycle 3. -she moved to [facility] with continued Keytruda on 12/02/21. She developed an allergic reaction about 8-9 hours later, with facial swelling around her jaw and some throat tightening. She was seen in the ED and given epinephrine, steroids, and antihistamines. Given this, we changed the Adriamycin to Epirubicin. She has tolerated moderately well overall, no recurrent allergic reaction. -labs reviewed, hgb down to 7.7. CMP is WNL. Plan for 1u pRBC prior to treatment next week. -she is scheduled for post-treatment MRI on 7/7 and to continue Keytruda on 7/27. -will send message to Dr. Alena Bills for her f/u appointment    2.  Anemia -secondary to chemo -she has required blood transfusion several times during chemo, on 4/8, 4/19, and 5/15 -hgb down to 7.7 today (01/29/22). I recommend blood transfusion before her final infusion.   3. Social Support -she has good support from her wife. -she has a history of anxiety and is on Xanax as needed. I advised her to f/u with her counselor.     PLAN:  -return tomorrow for 1u pRBC -proceed with cytoxan/epirubin/Keytruda on 7/5 as scheduled -injection and breast MRI 7/7  -will arrange for someone, either myself or Dr. Brantley Stage, to give them results of MRI  -lab, flush, f/u, and chemo 7/27   No problem-specific Assessment & Plan notes found for this encounter.   SUMMARY OF ONCOLOGIC HISTORY: Oncology History Overview Note   Cancer Staging  Malignant neoplasm of lower-inner quadrant of left breast in female, estrogen receptor positive ([place]) Staging form: Breast, AJCC 8th Edition - Clinical stage from 08/07/2021: Stage IIIA (cT2, cN1, cM0, G3, ER+, PR-, HER2-) - Signed by Truitt Merle, MD on 08/18/2021    Malignant neoplasm of lower-inner quadrant of left breast in female, estrogen receptor positive ([place])  08/07/2021 Cancer Staging   Staging form: Breast, AJCC 8th Edition - Clinical stage from 08/07/2021: Stage IIB (cT2, cN0, cM0, G3, ER-, PR-, HER2-) - Signed by Truitt Merle, MD on 09/24/2021 Stage prefix: Initial diagnosis Histologic grading system: 3 grade system   08/07/2021 Mammogram   CLINICAL DATA:  Palpable mass in the LEFT breast since August.   EXAM: DIGITAL DIAGNOSTIC BILATERAL MAMMOGRAM WITH TOMOSYNTHESIS AND CAD; ULTRASOUND LEFT BREAST LIMITED  IMPRESSION: 1. Suspicious mass in the 7:30 o'clock  location of the LEFT breast for which biopsy is recommended. 2. LEFT axillary adenopathy, with at least 4 abnormal lymph nodes.   08/07/2021 Initial Biopsy   Diagnosis 1. Breast, left, needle core biopsy, 7:30, ribbon clip - INVASIVE DUCTAL CARCINOMA - SEE COMMENT 2.  Lymph node, needle/core biopsy, left axilla, coil clip - NO CARCINOMA IDENTIFIED Microscopic Comment 1. based on the biopsy, the carcinoma appears Nottingham grade 3 of 3 and measures 1.1 cm in greatest linear extent.  1. PROGNOSTIC INDICATORS Results: The tumor cells are EQUIVOCAL for Her2 (2+). Her2 by FISH will be performed and results reported separately. Estrogen Receptor: 2%, POSITIVE, WEAK STAINING INTENSITY Progesterone Receptor: <1%, NEGATIVE Proliferation Marker Ki67: 40%  1. FLUORESCENCE IN-SITU HYBRIDIZATION Results: GROUP 5: HER2 **NEGATIVE**   08/17/2021 Initial Diagnosis   Malignant neoplasm of lower-inner quadrant of left breast in female, estrogen receptor positive ([place])   08/19/2021 Genetic Testing   [facility] CancerNext-Expanded Panel is Negative. Report date is 09/02/2021.  The CancerNext-Expanded gene panel offered by [facility] and includes sequencing, rearrangement, and RNA analysis for the following 77 genes: AIP, ALK, APC, ATM, AXIN2, BAP1, BARD1, BLM, BMPR1A, BRCA1, BRCA2, BRIP1, CDC73, CDH1, CDK4, CDKN1B, CDKN2A, CHEK2, CTNNA1, DICER1, FANCC, FH, FLCN, GALNT12, KIF1B, LZTR1, MAX, MEN1, MET, MLH1, MSH2, MSH3, MSH6, MUTYH, NBN, NF1, NF2, NTHL1, PALB2, PHOX2B, PMS2, POT1, PRKAR1A, PTCH1, PTEN, RAD51C, RAD51D, RB1, RECQL, RET, SDHA, SDHAF2, SDHB, SDHC, SDHD, SMAD4, SMARCA4, SMARCB1, SMARCE1, STK11, SUFU, TMEM127, TP53, TSC1, TSC2, VHL and XRCC2 (sequencing and deletion/duplication); EGFR, EGLN1, HOXB13, KIT, MITF, PDGFRA, POLD1, and POLE (sequencing only); EPCAM and GREM1 (deletion/duplication only).    08/26/2021 Imaging   EXAM: BILATERAL BREAST MRI WITH AND WITHOUT CONTRAST  IMPRESSION: 2.3 cm mass in the lower-inner quadrant of the left breast corresponding with the biopsy proven invasive ductal carcinoma.   08/31/2021 PET scan   IMPRESSION: 1. Known mass medially in the left breast is markedly hypermetabolic consistent with breast cancer. 2. No evidence of  nodal metastases within the chest. No typical distant metastases. 3. Suspected acute inflammatory process involving central mesentery and pelvis with ill-defined focal hypermetabolic activity anterior to the sacrum. There are pelvic inflammatory changes and free pelvic fluid. Recent labs demonstrate mild leukocytosis. Differential considerations include acute appendicitis, enteritis and small perforated foreign body. No evidence of drainable fluid collection, pneumoperitoneum or bowel obstruction. 4. These results were called by telephone at the time of interpretation on 08/31/2021 at 5:12 pm to on-call oncology nurse, [name], who verbally acknowledged these results. Unless there is a good clinical explanation for these findings, evaluation in the emergency department is likely warranted, and patient may benefit from abdominopelvic CT with oral and intravenous contrast.   09/01/2021 Imaging   EXAM: CT ABDOMEN AND PELVIS WITH CONTRAST  IMPRESSION: Multiple thick-walled loops of distal small bowel, suggesting infectious/inflammatory enteritis. Associated small volume pelvic ascites. No pneumatosis or free air.   No evidence of bowel obstruction.  Normal appendix.   2.1 cm lesion in the medial left breast, likely corresponding to the patient's known primary breast neoplasm. No findings suspicious for metastatic disease.   09/09/2021 -  Chemotherapy   Patient is on Treatment Plan : BREAST Pembrolizumab (200) D1 + Carboplatin (5) D1 + Paclitaxel (80) D1,8,15 q21d X 4 cycles / Pembrolizumab (200) D1 + AC D1 q21d x 4 cycles        INTERVAL HISTORY:  Cheyenne [name] is here for a follow up of breast cancer. She was last seen by NP Lacie on  01/13/22. She presents to the clinic accompanied by her wife. She reports her mouth sores have started to improve. She notes she has not obtained the magic mouthwash yet due to some miscommunication-- she was waiting for a call but found out she needed to be  at the pharmacy before they start to mix the magic mouthwash. She tells me she plans to pick it up today. She reports she has tolerated epirubicin much better than adriamycin.   All other systems were reviewed with the patient and are negative.  MEDICAL HISTORY:  Past Medical History:  Diagnosis Date   Anal fissure    Anxiety    Breast cancer ([place]) 08/10/21   Date diagnosis was given   DVT (deep venous thrombosis) ([place])    Incomplete right bundle branch block (RBBB) determined by electrocardiography 09/01/2021    SURGICAL HISTORY: Past Surgical History:  Procedure Laterality Date   NO PAST SURGERIES     PORTACATH PLACEMENT N/A 09/08/2021   Procedure: INSERTION PORT-A-CATH;  Surgeon: Erroll Luna, MD;  Location: [facility] ORS;  Service: General;  Laterality: N/A;    I have reviewed the social history and family history with the patient and they are unchanged from previous note.  ALLERGIES:  has No Known Allergies.  MEDICATIONS:  Current Outpatient Medications  Medication Sig Dispense Refill   acetaminophen (TYLENOL) 500 MG tablet Take 500-1,000 mg by mouth every 6 (six) hours as needed (pain.).     albuterol (VENTOLIN HFA) 108 (90 Base) MCG/ACT inhaler Inhale 2 puffs into the lungs every 4 (four) hours as needed for wheezing. 1 each 5   ALPRAZolam (XANAX) 0.5 MG tablet Take one tablet twice daily as needed for anxiety. 45 tablet 2   cyclobenzaprine (FLEXERIL) 10 MG tablet Take 10 mg by mouth daily as needed for muscle spasms.     desonide (DESOWEN) 0.05 % cream Apply topically 2 (two) times daily. (Patient taking differently: Apply 1 application. topically 2 (two) times daily as needed (skin irritation.).) 30 g 1   desonide (DESOWEN) 0.05 % lotion Apply 1 application. topically daily as needed (acne).     EPINEPHrine 0.3 mg/0.3 mL IJ SOAJ injection Inject 0.3 mg into the muscle as needed for anaphylaxis. 1 each 0   hydroquinone 4 % cream APPLY TO DARK SPOTS AT BEDTIME (Patient taking  differently: Apply 1 application. topically daily as needed (dark spots). APPLY TO DARK SPOTS AT BEDTIME) 28.35 g 0   ibuprofen (ADVIL) 800 MG tablet Take 1 tablet (800 mg total) by mouth every 8 (eight) hours as needed. 30 tablet 0   KLOR-CON M20 20 MEQ tablet TAKE 1 TABLET BY MOUTH TWICE A DAY 60 tablet 0   lidocaine-prilocaine (EMLA) cream Apply to affected area once 30 g 3   loratadine (CLARITIN) 10 MG tablet Take 10 mg by mouth daily.     magic mouthwash (nystatin, diphenhydrAMINE, alum & mag hydroxide) suspension mixture Swish and swallow 5 mLs by mouth 3 (three) times daily as needed for mouth pain. 140 mL 1   Multiple Vitamins-Minerals (ONE A DAY IMMUNITY DEFENSE) CHEW Chew 1 tablet by mouth daily.     ondansetron (ZOFRAN) 8 MG tablet Take 1 tablet (8 mg total) by mouth 2 (two) times daily as needed. Start on the third day after carboplatin and AC chemotherapy. 30 tablet 1   oxyCODONE (OXY IR/ROXICODONE) 5 MG immediate release tablet Take 1 tablet (5 mg total) by mouth every 6 (six) hours as needed for severe pain. (Patient not  taking: Reported on 12/02/2021) 15 tablet 0   prochlorperazine (COMPAZINE) 10 MG tablet Take 1 tablet (10 mg total) by mouth every 6 (six) hours as needed (Nausea or vomiting). 30 tablet 1   traMADol (ULTRAM) 50 MG tablet Take 1 tablet (50 mg total) by mouth every 6 (six) hours as needed. (Patient taking differently: Take 50 mg by mouth every 6 (six) hours as needed for moderate pain.) 5 tablet 0   No current facility-administered medications for this visit.    PHYSICAL EXAMINATION: ECOG PERFORMANCE STATUS: 1 - Symptomatic but completely ambulatory  Vitals:   01/29/22 0906  BP: 118/73  Pulse: 84  Resp: 17  Temp: 98.2 F (36.8 C)  SpO2: 100%   Wt Readings from Last 3 Encounters:  01/29/22 187 lb 12.8 oz (85.2 kg)  01/13/22 192 lb 3.2 oz (87.2 kg)  12/23/21 197 lb 12.8 oz (89.7 kg)     GENERAL:alert, no distress and comfortable SKIN: skin color normal, no  rashes or significant lesions EYES: normal, Conjunctiva are pink and non-injected, sclera clear  NEURO: alert & oriented x 3 with fluent speech  LABORATORY DATA:  I have reviewed the data as listed    Latest Ref Rng & Units 01/29/2022    8:42 AM 01/13/2022    8:30 AM 01/01/2022    8:30 AM  CBC  WBC 4.0 - 10.5 K/uL 6.1  6.6  3.9   Hemoglobin 12.0 - 15.0 g/dL 7.7  8.9  8.5   Hematocrit 36.0 - 46.0 % 23.1  25.9  24.6   Platelets 150 - 400 K/uL 223  423  151         Latest Ref Rng & Units 01/29/2022    8:42 AM 01/13/2022    8:30 AM 01/01/2022    8:30 AM  CMP  Glucose 70 - 99 mg/dL 109  134  119   BUN 6 - 20 mg/dL _0 Creatinine 0.44 - 1.00 mg/dL 0.92  0.92  0.94   Sodium 135 - 145 mmol/L 140  140  139   Potassium 3.5 - 5.1 mmol/L 3.7  3.6  3.3   Chloride 98 - 111 mmol/L 105  105  103   CO2 22 - 32 mmol/L _1 Calcium 8.9 - 10.3 mg/dL 9.7  9.8  9.6   Total Protein 6.5 - 8.1 g/dL 7.5  7.6  7.2   Total Bilirubin 0.3 - 1.2 mg/dL 0.4  0.4  0.5   Alkaline Phos 38 - 126 U/L 86  85  97   AST 15 - 41 U/L _2 ALT 0 - 44 U/L 19  35  16       RADIOGRAPHIC STUDIES: I have personally reviewed the radiological images as listed and agreed with the findings in the report. No results found.    No orders of the defined types were placed in this encounter.  All questions were answered. The patient knows to call the clinic with any problems, questions or concerns. No barriers to learning was detected. The total time spent in the appointment was 30 minutes.     Truitt Merle, MD 01/29/2022   I, Wilburn Mylar, am acting as scribe for Truitt Merle, MD.   I have reviewed the above documentation for accuracy and completeness, and I agree with the above.

## 2022-01-29 NOTE — Progress Notes (Signed)
Spoke with Cheyenne [name] in Blood Bank to confirm T&S and Prepare orders in Epic.  Orders are confirmed.

## 2022-01-30 ENCOUNTER — Inpatient Hospital Stay: Payer: Federal, State, Local not specified - PPO | Attending: Hematology

## 2022-01-30 VITALS — BP 114/68 | HR 87 | Temp 98.3°F | Resp 17

## 2022-01-30 DIAGNOSIS — T451X5A Adverse effect of antineoplastic and immunosuppressive drugs, initial encounter: Secondary | ICD-10-CM | POA: Diagnosis not present

## 2022-01-30 DIAGNOSIS — C50312 Malignant neoplasm of lower-inner quadrant of left female breast: Secondary | ICD-10-CM | POA: Insufficient documentation

## 2022-01-30 DIAGNOSIS — Z79899 Other long term (current) drug therapy: Secondary | ICD-10-CM | POA: Diagnosis not present

## 2022-01-30 DIAGNOSIS — Z95828 Presence of other vascular implants and grafts: Secondary | ICD-10-CM

## 2022-01-30 DIAGNOSIS — Z170 Estrogen receptor positive status [ER+]: Secondary | ICD-10-CM

## 2022-01-30 DIAGNOSIS — Z5112 Encounter for antineoplastic immunotherapy: Secondary | ICD-10-CM | POA: Diagnosis not present

## 2022-01-30 DIAGNOSIS — Z5189 Encounter for other specified aftercare: Secondary | ICD-10-CM | POA: Diagnosis not present

## 2022-01-30 DIAGNOSIS — D6481 Anemia due to antineoplastic chemotherapy: Secondary | ICD-10-CM | POA: Diagnosis not present

## 2022-01-30 DIAGNOSIS — Z5111 Encounter for antineoplastic chemotherapy: Secondary | ICD-10-CM | POA: Diagnosis not present

## 2022-01-30 LAB — CANCER ANTIGEN 27.29: CA 27.29: 31.7 U/mL (ref 0.0–38.6)

## 2022-01-30 MED ORDER — ACETAMINOPHEN 325 MG PO TABS
650.0000 mg | ORAL_TABLET | Freq: Once | ORAL | Status: AC
  Administered 2022-01-30: 650 mg via ORAL
  Filled 2022-01-30: qty 2

## 2022-01-30 MED ORDER — HEPARIN SOD (PORK) LOCK FLUSH 100 UNIT/ML IV SOLN
500.0000 [IU] | Freq: Once | INTRAVENOUS | Status: AC
  Administered 2022-01-30: 500 [IU]

## 2022-01-30 MED ORDER — DIPHENHYDRAMINE HCL 25 MG PO CAPS
25.0000 mg | ORAL_CAPSULE | Freq: Once | ORAL | Status: AC
  Administered 2022-01-30: 25 mg via ORAL
  Filled 2022-01-30: qty 1

## 2022-01-30 MED ORDER — SODIUM CHLORIDE 0.9% FLUSH
10.0000 mL | Freq: Once | INTRAVENOUS | Status: AC
  Administered 2022-01-30: 10 mL

## 2022-01-30 MED ORDER — SODIUM CHLORIDE 0.9% IV SOLUTION: 250.0000 mL | Freq: Once | INTRAVENOUS | Status: DC

## 2022-01-30 NOTE — Patient Instructions (Signed)
Blood Transfusion, Adult, Care After ?This sheet gives you information about how to care for yourself after your procedure. Your health care provider may also give you more specific instructions. If you have problems or questions, contact your health care provider. ?What can I expect after the procedure? ?After the procedure, it is common to have: ?Bruising and soreness where the IV was inserted. ?A headache. ?Follow these instructions at home: ?IV insertion site care ? ?  ? ?Follow instructions from your health care provider about how to take care of your IV insertion site. Make sure you: ?Wash your hands with soap and water before and after you change your bandage (dressing). If soap and water are not available, use hand sanitizer. ?Change your dressing as told by your health care provider. ?Check your IV insertion site every day for signs of infection. Check for: ?Redness, swelling, or pain. ?Bleeding from the site. ?Warmth. ?Pus or a bad smell. ?General instructions ?Take over-the-counter and prescription medicines only as told by your health care provider. ?Rest as told by your health care provider. ?Return to your normal activities as told by your health care provider. ?Keep all follow-up visits as told by your health care provider. This is important. ?Contact a health care provider if: ?You have itching or red, swollen areas of skin (hives). ?You feel anxious. ?You feel weak after doing your normal activities. ?You have redness, swelling, warmth, or pain around the IV insertion site. ?You have blood coming from the IV insertion site that does not stop with pressure. ?You have pus or a bad smell coming from your IV insertion site. ?Get help right away if: ?You have symptoms of a serious allergic or immune system reaction, including: ?Trouble breathing or shortness of breath. ?Swelling of the face or feeling flushed. ?Fever or chills. ?Pain in the head, back, or chest. ?Dark urine or blood in the  urine. ?Widespread rash. ?Fast heartbeat. ?Feeling dizzy or light-headed. ?If you receive your blood transfusion in an outpatient setting, you will be told whom to contact to report any reactions. ?These symptoms may represent a serious problem that is an emergency. Do not wait to see if the symptoms will go away. Get medical help right away. Call your local emergency services (911 in the [place] Do not drive yourself to the hospital. ?Summary ?Bruising and tenderness around the IV insertion site are common. ?Check your IV insertion site every day for signs of infection. ?Rest as told by your health care provider. Return to your normal activities as told by your health care provider. ?Get help right away for symptoms of a serious allergic or immune system reaction to blood transfusion. ?This information is not intended to replace advice given to you by your health care provider. Make sure you discuss any questions you have with your health care provider. ?Document Revised: 11/13/2020 Document Reviewed: 01/11/2019 ?Elsevier Patient Education ? 2023 [employer] ? ?

## 2022-02-01 ENCOUNTER — Ambulatory Visit: Payer: Self-pay | Admitting: Surgery

## 2022-02-01 DIAGNOSIS — C50912 Malignant neoplasm of unspecified site of left female breast: Secondary | ICD-10-CM

## 2022-02-01 LAB — BPAM RBC
Blood Product Expiration Date: 202307242359
ISSUE DATE / TIME: 202307010849
Unit Type and Rh: 5100

## 2022-02-01 LAB — TYPE AND SCREEN
ABO/RH(D): O POS
Antibody Screen: NEGATIVE
Unit division: 0

## 2022-02-01 MED FILL — Fosaprepitant Dimeglumine For IV Infusion 150 MG (Base Eq): INTRAVENOUS | Qty: 5 | Status: AC

## 2022-02-03 ENCOUNTER — Encounter: Payer: Self-pay | Admitting: *Deleted

## 2022-02-03 ENCOUNTER — Other Ambulatory Visit: Payer: Self-pay

## 2022-02-03 ENCOUNTER — Ambulatory Visit: Payer: Federal, State, Local not specified - PPO | Admitting: Hematology

## 2022-02-03 ENCOUNTER — Inpatient Hospital Stay: Payer: Federal, State, Local not specified - PPO

## 2022-02-03 VITALS — BP 114/59 | HR 90 | Temp 98.7°F | Resp 18 | Wt 189.8 lb

## 2022-02-03 DIAGNOSIS — C50312 Malignant neoplasm of lower-inner quadrant of left female breast: Secondary | ICD-10-CM

## 2022-02-03 DIAGNOSIS — D6481 Anemia due to antineoplastic chemotherapy: Secondary | ICD-10-CM | POA: Diagnosis not present

## 2022-02-03 DIAGNOSIS — Z79899 Other long term (current) drug therapy: Secondary | ICD-10-CM | POA: Diagnosis not present

## 2022-02-03 DIAGNOSIS — Z5112 Encounter for antineoplastic immunotherapy: Secondary | ICD-10-CM | POA: Diagnosis not present

## 2022-02-03 DIAGNOSIS — Z95828 Presence of other vascular implants and grafts: Secondary | ICD-10-CM

## 2022-02-03 DIAGNOSIS — Z5189 Encounter for other specified aftercare: Secondary | ICD-10-CM | POA: Diagnosis not present

## 2022-02-03 DIAGNOSIS — Z5111 Encounter for antineoplastic chemotherapy: Secondary | ICD-10-CM | POA: Diagnosis not present

## 2022-02-03 DIAGNOSIS — T451X5A Adverse effect of antineoplastic and immunosuppressive drugs, initial encounter: Secondary | ICD-10-CM | POA: Diagnosis not present

## 2022-02-03 LAB — CBC WITH DIFFERENTIAL (CANCER CENTER ONLY)
Abs Immature Granulocytes: 0.02 10*3/uL (ref 0.00–0.07)
Basophils Absolute: 0 10*3/uL (ref 0.0–0.1)
Basophils Relative: 1 %
Eosinophils Absolute: 0.1 10*3/uL (ref 0.0–0.5)
Eosinophils Relative: 2 %
HCT: 28.9 % — ABNORMAL LOW (ref 36.0–46.0)
Hemoglobin: 9.9 g/dL — ABNORMAL LOW (ref 12.0–15.0)
Immature Granulocytes: 0 %
Lymphocytes Relative: 20 %
Lymphs Abs: 1.3 10*3/uL (ref 0.7–4.0)
MCH: 33.2 pg (ref 26.0–34.0)
MCHC: 34.3 g/dL (ref 30.0–36.0)
MCV: 97 fL (ref 80.0–100.0)
Monocytes Absolute: 0.8 10*3/uL (ref 0.1–1.0)
Monocytes Relative: 13 %
Neutro Abs: 4.1 10*3/uL (ref 1.7–7.7)
Neutrophils Relative %: 64 %
Platelet Count: 323 10*3/uL (ref 150–400)
RBC: 2.98 MIL/uL — ABNORMAL LOW (ref 3.87–5.11)
RDW: 17.2 % — ABNORMAL HIGH (ref 11.5–15.5)
WBC Count: 6.4 10*3/uL (ref 4.0–10.5)
nRBC: 0 % (ref 0.0–0.2)

## 2022-02-03 LAB — CMP (CANCER CENTER ONLY)
ALT: 25 U/L (ref 0–44)
AST: 24 U/L (ref 15–41)
Albumin: 4.3 g/dL (ref 3.5–5.0)
Alkaline Phosphatase: 86 U/L (ref 38–126)
Anion gap: 6 (ref 5–15)
BUN: 17 mg/dL (ref 6–20)
CO2: 28 mmol/L (ref 22–32)
Calcium: 9.6 mg/dL (ref 8.9–10.3)
Chloride: 104 mmol/L (ref 98–111)
Creatinine: 0.87 mg/dL (ref 0.44–1.00)
GFR, Estimated: >60 mL/min (ref >60)
Glucose, Bld: 115 mg/dL — ABNORMAL HIGH (ref 70–99)
Potassium: 3.5 mmol/L (ref 3.5–5.1)
Sodium: 138 mmol/L (ref 135–145)
Total Bilirubin: 0.4 mg/dL (ref 0.3–1.2)
Total Protein: 7.6 g/dL (ref 6.5–8.1)

## 2022-02-03 MED ORDER — SODIUM CHLORIDE 0.9 % IV SOLN: Freq: Once | INTRAVENOUS | Status: AC

## 2022-02-03 MED ORDER — HEPARIN SOD (PORK) LOCK FLUSH 100 UNIT/ML IV SOLN
500.0000 [IU] | Freq: Once | INTRAVENOUS | Status: AC | PRN
  Administered 2022-02-03: 500 [IU]

## 2022-02-03 MED ORDER — FAMOTIDINE IN NACL 20-0.9 MG/50ML-% IV SOLN
20.0000 mg | Freq: Once | INTRAVENOUS | Status: AC
  Administered 2022-02-03: 20 mg via INTRAVENOUS
  Filled 2022-02-03: qty 50

## 2022-02-03 MED ORDER — DIPHENHYDRAMINE HCL 50 MG/ML IJ SOLN
50.0000 mg | Freq: Once | INTRAMUSCULAR | Status: AC
  Administered 2022-02-03: 50 mg via INTRAVENOUS
  Filled 2022-02-03: qty 1

## 2022-02-03 MED ORDER — EPIRUBICIN HCL CHEMO IV INJECTION 200 MG/100ML
99.0000 mg/m2 | Freq: Once | INTRAVENOUS | Status: AC
  Administered 2022-02-03: 200 mg via INTRAVENOUS
  Filled 2022-02-03: qty 100

## 2022-02-03 MED ORDER — PALONOSETRON HCL INJECTION 0.25 MG/5ML
0.2500 mg | Freq: Once | INTRAVENOUS | Status: AC
  Administered 2022-02-03: 0.25 mg via INTRAVENOUS
  Filled 2022-02-03: qty 5

## 2022-02-03 MED ORDER — SODIUM CHLORIDE 0.9 % IV SOLN
150.0000 mg | Freq: Once | INTRAVENOUS | Status: AC
  Administered 2022-02-03: 150 mg via INTRAVENOUS
  Filled 2022-02-03: qty 150

## 2022-02-03 MED ORDER — SODIUM CHLORIDE 0.9% FLUSH
10.0000 mL | Freq: Once | INTRAVENOUS | Status: AC
  Administered 2022-02-03: 10 mL

## 2022-02-03 MED ORDER — SODIUM CHLORIDE 0.9 % IV SOLN
600.0000 mg/m2 | Freq: Once | INTRAVENOUS | Status: AC
  Administered 2022-02-03: 1220 mg via INTRAVENOUS
  Filled 2022-02-03: qty 61

## 2022-02-03 MED ORDER — SODIUM CHLORIDE 0.9 % IV SOLN
200.0000 mg | Freq: Once | INTRAVENOUS | Status: AC
  Administered 2022-02-03: 200 mg via INTRAVENOUS
  Filled 2022-02-03: qty 200

## 2022-02-03 MED ORDER — SODIUM CHLORIDE 0.9% FLUSH
10.0000 mL | INTRAVENOUS | Status: DC | PRN
  Administered 2022-02-03: 10 mL

## 2022-02-03 NOTE — Patient Instructions (Signed)
[facility]  Discharge Instructions: Thank you for choosing [facility] to provide your oncology and hematology care.   If you have a lab appointment with the [facility], please go directly to the [facility] and check in at the registration area.   Wear comfortable clothing and clothing appropriate for easy access to any Portacath or PICC line.   We strive to give you quality time with your provider. You may need to reschedule your appointment if you arrive late (15 or more minutes).  Arriving late affects you and other patients whose appointments are after yours.  Also, if you miss three or more appointments without notifying the office, you may be dismissed from the clinic at the provider's discretion.      For prescription refill requests, have your pharmacy contact our office and allow 72 hours for refills to be completed.    Today you received the following chemotherapy and/or immunotherapy agents; Keytruda, Cytoxan, and Epirubicin      To help prevent nausea and vomiting after your treatment, we encourage you to take your nausea medication as directed.  BELOW ARE SYMPTOMS THAT SHOULD BE REPORTED IMMEDIATELY: *FEVER GREATER THAN 100.4 F (38 C) OR HIGHER *CHILLS OR SWEATING *NAUSEA AND VOMITING THAT IS NOT CONTROLLED WITH YOUR NAUSEA MEDICATION *UNUSUAL SHORTNESS OF BREATH *UNUSUAL BRUISING OR BLEEDING *URINARY PROBLEMS (pain or burning when urinating, or frequent urination) *BOWEL PROBLEMS (unusual diarrhea, constipation, pain near the anus) TENDERNESS IN MOUTH AND THROAT WITH OR WITHOUT PRESENCE OF ULCERS (sore throat, sores in mouth, or a toothache) UNUSUAL RASH, SWELLING OR PAIN  UNUSUAL VAGINAL DISCHARGE OR ITCHING   Items with * indicate a potential emergency and should be followed up as soon as possible or go to the Emergency Department if any problems should occur.  Please show the CHEMOTHERAPY ALERT CARD or IMMUNOTHERAPY  ALERT CARD at check-in to the Emergency Department and triage nurse.  Should you have questions after your visit or need to cancel or reschedule your appointment, please contact [facility]  Dept: [phone]  and follow the prompts.  Office hours are 8:00 a.m. to 4:30 p.m. Monday - Friday. Please note that voicemails left after 4:00 p.m. may not be returned until the following business day.  We are closed weekends and major holidays. You have access to a nurse at all times for urgent questions. Please call the main number to the clinic Dept: [phone] and follow the prompts.   For any non-urgent questions, you may also contact your provider using MyChart. We now offer e-Visits for anyone 40 and older to request care online for non-urgent symptoms. For details visit [email or web address].   Also download the MyChart app! Go to the app store, search "MyChart", open the app, select [facility], and log in with your MyChart username and password.  Masks are optional in the cancer centers. If you would like for your care team to wear a mask while they are taking care of you, please let them know. For doctor visits, patients may have with them one support person who is at least 40 years old. At this time, visitors are not allowed in the infusion area.  Epirubicin injection What is this medication? EPIRUBICIN (ep i ROO bi sin) is a chemotherapy drug. This medicine is used to treat breast cancer. This medicine may be used for other purposes; ask your health care provider or pharmacist if you have questions. COMMON BRAND NAME(S): Ellence  What should I tell my care team before I take this medication? They need to know if you have any of these conditions: heart disease history of irregular heartbeat history of low blood counts caused by a medicine kidney disease liver disease recent heart attack recent or ongoing radiation therapy recently received or scheduled to  receive a vaccine an unusual or allergic reaction to epirubicin, other chemotherapy agents, other medicines, foods, dyes, or preservatives pregnant or trying to get pregnant breast-feeding How should I use this medication? This medicine is for infusion into a vein. It is given by a healthcare professional in a hospital or clinic setting. Talk to your pediatrician regarding the use of this medicine in children. Special care may be needed. Overdosage: If you think you have taken too much of this medicine contact a poison control center or emergency room at once. NOTE: This medicine is only for you. Do not share this medicine with others. What if I miss a dose? It is important not to miss your dose. Call your doctor or health care professional if you are unable to keep an appointment. What may interact with this medication? This medicine may interact with the following medications: calcium channel blockers cimetidine docetaxel paclitaxel trastuzumab live virus vaccines This list may not describe all possible interactions. Give your health care provider a list of all the medicines, herbs, non-prescription drugs, or dietary supplements you use. Also tell them if you smoke, drink alcohol, or use illegal drugs. Some items may interact with your medicine. What should I watch for while using this medication? Your condition will be monitored carefully while you are receiving this medicine. You will need important blood work done while you are taking this medicine. This medicine may make you feel generally unwell. This is not uncommon as chemotherapy can affect healthy cells as well as cancer cells. Report any side effects. Continue your course of treatment even though you feel ill unless your healthcare professional tells you to stop. Your urine may turn red for a few days after your dose. This is not blood. If your urine is dark or brown, call your doctor. This medicine may increase your risk of  getting an infection. Call your healthcare professional for advice if you get a fever, chills, or sore throat, or other symptoms of a cold or flu. Do not treat yourself. Try to avoid being around people who are sick. Call your healthcare professional if you are around anyone with measles, chickenpox, or if you develop sores or blisters that do not heal properly. Call your healthcare professional for advice if you get a fever, chills, or sore throat, or other symptoms of a cold or flu. Do not treat yourself. This medicine decreases your body's ability to fight infections. Try to avoid being around people who are sick. Avoid taking medicines that contain aspirin, acetaminophen, ibuprofen, naproxen, or ketoprofen unless instructed by your healthcare professional. These medicines may hide a fever. Be careful brushing or flossing your teeth or using a toothpick because you may get an infection or bleed more easily. If you have any dental work done, tell your dentist you are receiving this medicine. Do not become pregnant while taking this medicine or for 6 months after stopping it. Women should inform their healthcare professional if they wish to become pregnant or think they might be pregnant. Men should not father a child while taking this medicine and for 3 months after stopping it. There is potential for serious side effects to an  unborn child. Talk to your healthcare professional for more information. Do not breast-feed an infant while taking this medicine or for at least 7 days after stopping it. This medicine has caused ovarian failure in some women. This medicine may make it more difficult to get pregnant. Talk to your healthcare professional if you are concerned about your fertility. This medicine has caused decreased sperm counts in some men. This may make it more difficult to father a child. Talk to your healthcare professional if you are concerned about your fertility. There is a maximum amount of  this medicine you should receive throughout your life. The amount depends on the medical condition being treated and your overall health. Your healthcare professional will watch how much of this medicine you receive. Tell your healthcare professional if you have taken this medicine before. This medicine can make you more sensitive to the sun. Keep out of the sun, If you cannot avoid being in the sun, wear protective clothing and sunscreen. Do not use sun lamps or tanning beds/booths What side effects may I notice from receiving this medication? Side effects that you should report to your doctor or health care professional as soon as possible: allergic reactions like skin rash, itching or hives, swelling of the face, lips, or tongue chest pain or palpitations feeling faint or lightheaded, falls pain, redness, or irritation at site where injected signs and symptoms of a blood clot such as breathing problems; changes in vision; chest pain; severe, sudden headache; pain, swelling, warmth in the leg; trouble speaking; sudden numbness or weakness of the face, arm or leg signs and symptoms of a dangerous change in heartbeat or heart rhythm like chest pain; dizziness; fast or irregular heartbeat; palpitations; feeling faint or lightheaded, falls; breathing problems signs and symptoms of heart failure like breathing problems, fast, irregular heartbeat, sudden weight gain; swelling of the ankles, feet, hands; unusually weak or tired signs of decreased platelets or bleeding - bruising, pinpoint red spots on the skin, black, tarry stools, blood in the urine signs of decreased red blood cells - unusually weak or tired, feeling faint or lightheaded, falls signs of infection - fever or chills, cough, sore throat, pain or difficulty passing urine Side effects that usually do not require medical attention (report to your doctor or health care professional if they continue or are bothersome): changes in skin or nail  color diarrhea dizziness hair loss increased skin sensitivity to the sun loss of appetite mouth sores nausea, vomiting red urine This list may not describe all possible side effects. Call your doctor for medical advice about side effects. You may report side effects to FDA at 1-800-FDA-1088. Where should I keep my medication? This drug is given in a hospital or clinic and will not be stored at home. NOTE: This sheet is a summary. It may not cover all possible information. If you have questions about this medicine, talk to your doctor, pharmacist, or health care provider.  2023 Elsevier/Gold Standard (2021-06-19 00:00:00)  Pembrolizumab injection What is this medication? PEMBROLIZUMAB (pem broe liz ue mab) is a monoclonal antibody. It is used to treat certain types of cancer. This medicine may be used for other purposes; ask your health care provider or pharmacist if you have questions. COMMON BRAND NAME(S): Keytruda What should I tell my care team before I take this medication? They need to know if you have any of these conditions: autoimmune diseases like Crohn's disease, ulcerative colitis, or lupus have had or planning to have an  allogeneic stem cell transplant (uses someone else's stem cells) history of organ transplant history of chest radiation nervous system problems like myasthenia gravis or Guillain-Barre syndrome an unusual or allergic reaction to pembrolizumab, other medicines, foods, dyes, or preservatives pregnant or trying to get pregnant breast-feeding How should I use this medication? This medicine is for infusion into a vein. It is given by a health care professional in a hospital or clinic setting. A special MedGuide will be given to you before each treatment. Be sure to read this information carefully each time. Talk to your pediatrician regarding the use of this medicine in children. While this drug may be prescribed for children as young as 6 months for selected  conditions, precautions do apply. Overdosage: If you think you have taken too much of this medicine contact a poison control center or emergency room at once. NOTE: This medicine is only for you. Do not share this medicine with others. What if I miss a dose? It is important not to miss your dose. Call your doctor or health care professional if you are unable to keep an appointment. What may interact with this medication? Interactions have not been studied. This list may not describe all possible interactions. Give your health care provider a list of all the medicines, herbs, non-prescription drugs, or dietary supplements you use. Also tell them if you smoke, drink alcohol, or use illegal drugs. Some items may interact with your medicine. What should I watch for while using this medication? Your condition will be monitored carefully while you are receiving this medicine. You may need blood work done while you are taking this medicine. Do not become pregnant while taking this medicine or for 4 months after stopping it. Women should inform their doctor if they wish to become pregnant or think they might be pregnant. There is a potential for serious side effects to an unborn child. Talk to your health care professional or pharmacist for more information. Do not breast-feed an infant while taking this medicine or for 4 months after the last dose. What side effects may I notice from receiving this medication? Side effects that you should report to your doctor or health care professional as soon as possible: allergic reactions like skin rash, itching or hives, swelling of the face, lips, or tongue bloody or black, tarry breathing problems changes in vision chest pain chills confusion constipation cough diarrhea dizziness or feeling faint or lightheaded fast or irregular heartbeat fever flushing joint pain low blood counts - this medicine may decrease the number of white blood cells, red blood  cells and platelets. You may be at increased risk for infections and bleeding. muscle pain muscle weakness pain, tingling, numbness in the hands or feet persistent headache redness, blistering, peeling or loosening of the skin, including inside the mouth signs and symptoms of high blood sugar such as dizziness; dry mouth; dry skin; fruity breath; nausea; stomach pain; increased hunger or thirst; increased urination signs and symptoms of kidney injury like trouble passing urine or change in the amount of urine signs and symptoms of liver injury like dark urine, light-colored stools, loss of appetite, nausea, right upper belly pain, yellowing of the eyes or skin sweating swollen lymph nodes weight loss Side effects that usually do not require medical attention (report to your doctor or health care professional if they continue or are bothersome): decreased appetite hair loss tiredness This list may not describe all possible side effects. Call your doctor for medical advice about side effects.  You may report side effects to FDA at 1-800-FDA-1088. Where should I keep my medication? This drug is given in a hospital or clinic and will not be stored at home. NOTE: This sheet is a summary. It may not cover all possible information. If you have questions about this medicine, talk to your doctor, pharmacist, or health care provider.  2023 Elsevier/Gold Standard (2021-06-19 00:00:00) Cyclophosphamide Injection What is this medication? CYCLOPHOSPHAMIDE (sye kloe FOSS fa mide) is a chemotherapy drug. It slows the growth of cancer cells. This medicine is used to treat many types of cancer like lymphoma, myeloma, leukemia, breast cancer, and ovarian cancer, to name a few. This medicine may be used for other purposes; ask your health care provider or pharmacist if you have questions. COMMON BRAND NAME(S): Cyclophosphamide, Cytoxan, Neosar What should I tell my care team before I take this  medication? They need to know if you have any of these conditions: heart disease history of irregular heartbeat infection kidney disease liver disease low blood counts, like white cells, platelets, or red blood cells on hemodialysis recent or ongoing radiation therapy scarring or thickening of the lungs trouble passing urine an unusual or allergic reaction to cyclophosphamide, other medicines, foods, dyes, or preservatives pregnant or trying to get pregnant breast-feeding How should I use this medication? This drug is usually given as an injection into a vein or muscle or by infusion into a vein. It is administered in a hospital or clinic by a specially trained health care professional. Talk to your pediatrician regarding the use of this medicine in children. Special care may be needed. Overdosage: If you think you have taken too much of this medicine contact a poison control center or emergency room at once. NOTE: This medicine is only for you. Do not share this medicine with others. What if I miss a dose? It is important not to miss your dose. Call your doctor or health care professional if you are unable to keep an appointment. What may interact with this medication? amphotericin B azathioprine certain antivirals for HIV or hepatitis certain medicines for blood pressure, heart disease, irregular heart beat certain medicines that treat or prevent blood clots like warfarin certain other medicines for cancer cyclosporine etanercept indomethacin medicines that relax muscles for surgery medicines to increase blood counts metronidazole This list may not describe all possible interactions. Give your health care provider a list of all the medicines, herbs, non-prescription drugs, or dietary supplements you use. Also tell them if you smoke, drink alcohol, or use illegal drugs. Some items may interact with your medicine. What should I watch for while using this medication? Your  condition will be monitored carefully while you are receiving this medicine. You may need blood work done while you are taking this medicine. Drink water or other fluids as directed. Urinate often, even at night. Some products may contain alcohol. Ask your health care professional if this medicine contains alcohol. Be sure to tell all health care professionals you are taking this medicine. Certain medicines, like metronidazole and disulfiram, can cause an unpleasant reaction when taken with alcohol. The reaction includes flushing, headache, nausea, vomiting, sweating, and increased thirst. The reaction can last from 30 minutes to several hours. Do not become pregnant while taking this medicine or for 1 year after stopping it. Women should inform their health care professional if they wish to become pregnant or think they might be pregnant. Men should not father a child while taking this medicine and for 4 months  after stopping it. There is potential for serious side effects to an unborn child. Talk to your health care professional for more information. Do not breast-feed an infant while taking this medicine or for 1 week after stopping it. This medicine has caused ovarian failure in some women. This medicine may make it more difficult to get pregnant. Talk to your health care professional if you are concerned about your fertility. This medicine has caused decreased sperm counts in some men. This may make it more difficult to father a child. Talk to your health care professional if you are concerned about your fertility. Call your health care professional for advice if you get a fever, chills, or sore throat, or other symptoms of a cold or flu. Do not treat yourself. This medicine decreases your body's ability to fight infections. Try to avoid being around people who are sick. Avoid taking medicines that contain aspirin, acetaminophen, ibuprofen, naproxen, or ketoprofen unless instructed by your health care  professional. These medicines may hide a fever. Talk to your health care professional about your risk of cancer. You may be more at risk for certain types of cancer if you take this medicine. If you are going to need surgery or other procedure, tell your health care professional that you are using this medicine. Be careful brushing or flossing your teeth or using a toothpick because you may get an infection or bleed more easily. If you have any dental work done, tell your dentist you are receiving this medicine. What side effects may I notice from receiving this medication? Side effects that you should report to your doctor or health care professional as soon as possible: allergic reactions like skin rash, itching or hives, swelling of the face, lips, or tongue breathing problems nausea, vomiting signs and symptoms of bleeding such as bloody or black, tarry stools; red or dark brown urine; spitting up blood or brown material that looks like coffee grounds; red spots on the skin; unusual bruising or bleeding from the eyes, gums, or nose signs and symptoms of heart failure like fast, irregular heartbeat, sudden weight gain; swelling of the ankles, feet, hands signs and symptoms of infection like fever; chills; cough; sore throat; pain or trouble passing urine signs and symptoms of kidney injury like trouble passing urine or change in the amount of urine signs and symptoms of liver injury like dark yellow or brown urine; general ill feeling or flu-like symptoms; light-colored stools; loss of appetite; nausea; right upper belly pain; unusually weak or tired; yellowing of the eyes or skin Side effects that usually do not require medical attention (report to your doctor or health care professional if they continue or are bothersome): confusion decreased hearing diarrhea facial flushing hair loss headache loss of appetite missed menstrual periods signs and symptoms of low red blood cells or anemia  such as unusually weak or tired; feeling faint or lightheaded; falls skin discoloration This list may not describe all possible side effects. Call your doctor for medical advice about side effects. You may report side effects to FDA at 1-800-FDA-1088. Where should I keep my medication? This drug is given in a hospital or clinic and will not be stored at home. NOTE: This sheet is a summary. It may not cover all possible information. If you have questions about this medicine, talk to your doctor, pharmacist, or health care provider.  2023 Elsevier/Gold Standard (2021-06-19 00:00:00)

## 2022-02-05 ENCOUNTER — Ambulatory Visit (HOSPITAL_COMMUNITY)
Admission: RE | Admit: 2022-02-05 | Discharge: 2022-02-05 | Disposition: A | Discharge status: Home / Self Care | Payer: Federal, State, Local not specified - PPO | Source: Ambulatory Visit | Attending: Nurse Practitioner | Admitting: Nurse Practitioner

## 2022-02-05 ENCOUNTER — Inpatient Hospital Stay: Payer: Federal, State, Local not specified - PPO

## 2022-02-05 ENCOUNTER — Other Ambulatory Visit: Payer: Self-pay

## 2022-02-05 VITALS — BP 118/68 | HR 78 | Temp 98.2°F | Resp 18

## 2022-02-05 DIAGNOSIS — Z170 Estrogen receptor positive status [ER+]: Secondary | ICD-10-CM

## 2022-02-05 DIAGNOSIS — D6481 Anemia due to antineoplastic chemotherapy: Secondary | ICD-10-CM | POA: Diagnosis not present

## 2022-02-05 DIAGNOSIS — Z5189 Encounter for other specified aftercare: Secondary | ICD-10-CM | POA: Diagnosis not present

## 2022-02-05 DIAGNOSIS — C50312 Malignant neoplasm of lower-inner quadrant of left female breast: Secondary | ICD-10-CM | POA: Insufficient documentation

## 2022-02-05 DIAGNOSIS — Z5111 Encounter for antineoplastic chemotherapy: Secondary | ICD-10-CM | POA: Diagnosis not present

## 2022-02-05 DIAGNOSIS — Z5112 Encounter for antineoplastic immunotherapy: Secondary | ICD-10-CM | POA: Diagnosis not present

## 2022-02-05 DIAGNOSIS — T451X5A Adverse effect of antineoplastic and immunosuppressive drugs, initial encounter: Secondary | ICD-10-CM | POA: Diagnosis not present

## 2022-02-05 DIAGNOSIS — R928 Other abnormal and inconclusive findings on diagnostic imaging of breast: Secondary | ICD-10-CM | POA: Diagnosis not present

## 2022-02-05 DIAGNOSIS — Z79899 Other long term (current) drug therapy: Secondary | ICD-10-CM | POA: Diagnosis not present

## 2022-02-05 MED ORDER — GADOBUTROL 1 MMOL/ML IV SOLN
8.0000 mL | Freq: Once | INTRAVENOUS | Status: AC | PRN
  Administered 2022-02-05: 8 mL via INTRAVENOUS

## 2022-02-05 MED ORDER — PEGFILGRASTIM-CBQV 6 MG/0.6ML ~~LOC~~ SOSY
6.0000 mg | PREFILLED_SYRINGE | Freq: Once | SUBCUTANEOUS | Status: AC
  Administered 2022-02-05: 6 mg via SUBCUTANEOUS
  Filled 2022-02-05: qty 0.6

## 2022-02-08 ENCOUNTER — Encounter: Payer: Self-pay | Admitting: *Deleted

## 2022-02-09 ENCOUNTER — Other Ambulatory Visit: Payer: Self-pay | Admitting: Surgery

## 2022-02-09 DIAGNOSIS — C50912 Malignant neoplasm of unspecified site of left female breast: Secondary | ICD-10-CM

## 2022-02-11 ENCOUNTER — Other Ambulatory Visit: Payer: Self-pay

## 2022-02-11 DIAGNOSIS — R7303 Prediabetes: Secondary | ICD-10-CM | POA: Diagnosis not present

## 2022-02-11 DIAGNOSIS — B3731 Acute candidiasis of vulva and vagina: Secondary | ICD-10-CM | POA: Diagnosis not present

## 2022-02-11 DIAGNOSIS — N898 Other specified noninflammatory disorders of vagina: Secondary | ICD-10-CM | POA: Diagnosis not present

## 2022-02-11 DIAGNOSIS — Z170 Estrogen receptor positive status [ER+]: Secondary | ICD-10-CM

## 2022-02-12 ENCOUNTER — Other Ambulatory Visit: Payer: Self-pay

## 2022-02-12 ENCOUNTER — Inpatient Hospital Stay: Payer: Federal, State, Local not specified - PPO

## 2022-02-12 DIAGNOSIS — D6481 Anemia due to antineoplastic chemotherapy: Secondary | ICD-10-CM | POA: Diagnosis not present

## 2022-02-12 DIAGNOSIS — Z5111 Encounter for antineoplastic chemotherapy: Secondary | ICD-10-CM | POA: Diagnosis not present

## 2022-02-12 DIAGNOSIS — Z170 Estrogen receptor positive status [ER+]: Secondary | ICD-10-CM

## 2022-02-12 DIAGNOSIS — Z5112 Encounter for antineoplastic immunotherapy: Secondary | ICD-10-CM | POA: Diagnosis not present

## 2022-02-12 DIAGNOSIS — Z5189 Encounter for other specified aftercare: Secondary | ICD-10-CM | POA: Diagnosis not present

## 2022-02-12 DIAGNOSIS — C50312 Malignant neoplasm of lower-inner quadrant of left female breast: Secondary | ICD-10-CM | POA: Diagnosis not present

## 2022-02-12 DIAGNOSIS — Z79899 Other long term (current) drug therapy: Secondary | ICD-10-CM | POA: Diagnosis not present

## 2022-02-12 DIAGNOSIS — T451X5A Adverse effect of antineoplastic and immunosuppressive drugs, initial encounter: Secondary | ICD-10-CM | POA: Diagnosis not present

## 2022-02-12 LAB — CBC WITH DIFFERENTIAL (CANCER CENTER ONLY)
Abs Immature Granulocytes: 0.01 10*3/uL (ref 0.00–0.07)
Basophils Absolute: 0 10*3/uL (ref 0.0–0.1)
Basophils Relative: 3 %
Eosinophils Absolute: 0.1 10*3/uL (ref 0.0–0.5)
Eosinophils Relative: 8 %
HCT: 26.8 % — ABNORMAL LOW (ref 36.0–46.0)
Hemoglobin: 9.1 g/dL — ABNORMAL LOW (ref 12.0–15.0)
Immature Granulocytes: 1 %
Lymphocytes Relative: 54 %
Lymphs Abs: 0.5 10*3/uL — ABNORMAL LOW (ref 0.7–4.0)
MCH: 33.2 pg (ref 26.0–34.0)
MCHC: 34 g/dL (ref 30.0–36.0)
MCV: 97.8 fL (ref 80.0–100.0)
Monocytes Absolute: 0.1 10*3/uL (ref 0.1–1.0)
Monocytes Relative: 5 %
Neutro Abs: 0.3 10*3/uL — CL (ref 1.7–7.7)
Neutrophils Relative %: 29 %
Platelet Count: 90 10*3/uL — ABNORMAL LOW (ref 150–400)
RBC: 2.74 MIL/uL — ABNORMAL LOW (ref 3.87–5.11)
RDW: 14.9 % (ref 11.5–15.5)
Smear Review: NORMAL
WBC Count: 1 10*3/uL — ABNORMAL LOW (ref 4.0–10.5)
nRBC: 0 % (ref 0.0–0.2)

## 2022-02-12 LAB — CMP (CANCER CENTER ONLY)
ALT: 17 U/L (ref 0–44)
AST: 18 U/L (ref 15–41)
Albumin: 4.4 g/dL (ref 3.5–5.0)
Alkaline Phosphatase: 100 U/L (ref 38–126)
Anion gap: 8 (ref 5–15)
BUN: 16 mg/dL (ref 6–20)
CO2: 27 mmol/L (ref 22–32)
Calcium: 9.9 mg/dL (ref 8.9–10.3)
Chloride: 105 mmol/L (ref 98–111)
Creatinine: 0.91 mg/dL (ref 0.44–1.00)
GFR, Estimated: >60 mL/min (ref >60)
Glucose, Bld: 103 mg/dL — ABNORMAL HIGH (ref 70–99)
Potassium: 3.8 mmol/L (ref 3.5–5.1)
Sodium: 140 mmol/L (ref 135–145)
Total Bilirubin: 0.6 mg/dL (ref 0.3–1.2)
Total Protein: 7.8 g/dL (ref 6.5–8.1)

## 2022-02-14 ENCOUNTER — Other Ambulatory Visit: Payer: Self-pay | Admitting: Hematology

## 2022-02-17 ENCOUNTER — Encounter: Payer: Self-pay | Admitting: Hematology

## 2022-02-19 ENCOUNTER — Other Ambulatory Visit (HOSPITAL_COMMUNITY): Payer: Self-pay

## 2022-02-19 ENCOUNTER — Encounter: Payer: Self-pay | Admitting: Hematology

## 2022-02-22 ENCOUNTER — Other Ambulatory Visit: Payer: Self-pay

## 2022-02-23 ENCOUNTER — Other Ambulatory Visit: Payer: Self-pay

## 2022-02-25 ENCOUNTER — Inpatient Hospital Stay: Payer: Federal, State, Local not specified - PPO

## 2022-02-25 ENCOUNTER — Encounter: Payer: Self-pay | Admitting: Hematology

## 2022-02-25 ENCOUNTER — Other Ambulatory Visit: Payer: Self-pay

## 2022-02-25 ENCOUNTER — Inpatient Hospital Stay (HOSPITAL_BASED_OUTPATIENT_CLINIC_OR_DEPARTMENT_OTHER): Payer: Federal, State, Local not specified - PPO | Admitting: Hematology

## 2022-02-25 ENCOUNTER — Encounter: Payer: Self-pay | Admitting: *Deleted

## 2022-02-25 VITALS — BP 103/62 | HR 85 | Temp 98.4°F | Resp 18 | Ht 62.0 in | Wt 184.2 lb

## 2022-02-25 DIAGNOSIS — Z95828 Presence of other vascular implants and grafts: Secondary | ICD-10-CM

## 2022-02-25 DIAGNOSIS — Z5111 Encounter for antineoplastic chemotherapy: Secondary | ICD-10-CM | POA: Diagnosis not present

## 2022-02-25 DIAGNOSIS — Z170 Estrogen receptor positive status [ER+]: Secondary | ICD-10-CM

## 2022-02-25 DIAGNOSIS — Z79899 Other long term (current) drug therapy: Secondary | ICD-10-CM | POA: Diagnosis not present

## 2022-02-25 DIAGNOSIS — C50312 Malignant neoplasm of lower-inner quadrant of left female breast: Secondary | ICD-10-CM | POA: Diagnosis not present

## 2022-02-25 DIAGNOSIS — Z5189 Encounter for other specified aftercare: Secondary | ICD-10-CM | POA: Diagnosis not present

## 2022-02-25 DIAGNOSIS — T451X5A Adverse effect of antineoplastic and immunosuppressive drugs, initial encounter: Secondary | ICD-10-CM | POA: Diagnosis not present

## 2022-02-25 DIAGNOSIS — Z5112 Encounter for antineoplastic immunotherapy: Secondary | ICD-10-CM | POA: Diagnosis not present

## 2022-02-25 DIAGNOSIS — D6481 Anemia due to antineoplastic chemotherapy: Secondary | ICD-10-CM | POA: Diagnosis not present

## 2022-02-25 LAB — CMP (CANCER CENTER ONLY)
ALT: 47 U/L — ABNORMAL HIGH (ref 0–44)
AST: 38 U/L (ref 15–41)
Albumin: 4.3 g/dL (ref 3.5–5.0)
Alkaline Phosphatase: 93 U/L (ref 38–126)
Anion gap: 8 (ref 5–15)
BUN: 11 mg/dL (ref 6–20)
CO2: 27 mmol/L (ref 22–32)
Calcium: 9.5 mg/dL (ref 8.9–10.3)
Chloride: 105 mmol/L (ref 98–111)
Creatinine: 0.85 mg/dL (ref 0.44–1.00)
GFR, Estimated: >60 mL/min (ref >60)
Glucose, Bld: 121 mg/dL — ABNORMAL HIGH (ref 70–99)
Potassium: 3.6 mmol/L (ref 3.5–5.1)
Sodium: 140 mmol/L (ref 135–145)
Total Bilirubin: 0.3 mg/dL (ref 0.3–1.2)
Total Protein: 7.6 g/dL (ref 6.5–8.1)

## 2022-02-25 LAB — CBC WITH DIFFERENTIAL (CANCER CENTER ONLY)
Abs Immature Granulocytes: 0.23 10*3/uL — ABNORMAL HIGH (ref 0.00–0.07)
Basophils Absolute: 0 10*3/uL (ref 0.0–0.1)
Basophils Relative: 1 %
Eosinophils Absolute: 0.1 10*3/uL (ref 0.0–0.5)
Eosinophils Relative: 2 %
HCT: 26 % — ABNORMAL LOW (ref 36.0–46.0)
Hemoglobin: 9 g/dL — ABNORMAL LOW (ref 12.0–15.0)
Immature Granulocytes: 5 %
Lymphocytes Relative: 17 %
Lymphs Abs: 0.8 10*3/uL (ref 0.7–4.0)
MCH: 33.7 pg (ref 26.0–34.0)
MCHC: 34.6 g/dL (ref 30.0–36.0)
MCV: 97.4 fL (ref 80.0–100.0)
Monocytes Absolute: 0.5 10*3/uL (ref 0.1–1.0)
Monocytes Relative: 12 %
Neutro Abs: 2.8 10*3/uL (ref 1.7–7.7)
Neutrophils Relative %: 63 %
Platelet Count: 275 10*3/uL (ref 150–400)
RBC: 2.67 MIL/uL — ABNORMAL LOW (ref 3.87–5.11)
RDW: 15.8 % — ABNORMAL HIGH (ref 11.5–15.5)
WBC Count: 4.5 10*3/uL (ref 4.0–10.5)
nRBC: 0 % (ref 0.0–0.2)

## 2022-02-25 LAB — T4, FREE: Free T4: 0.88 ng/dL (ref 0.61–1.12)

## 2022-02-25 LAB — TSH: TSH: 2.214 u[IU]/mL (ref 0.350–4.500)

## 2022-02-25 MED ORDER — SODIUM CHLORIDE 0.9 % IV SOLN: Freq: Once | INTRAVENOUS | Status: AC

## 2022-02-25 MED ORDER — SODIUM CHLORIDE 0.9% FLUSH
10.0000 mL | Freq: Once | INTRAVENOUS | Status: AC
  Administered 2022-02-25: 10 mL

## 2022-02-25 MED ORDER — SODIUM CHLORIDE 0.9 % IV SOLN
200.0000 mg | Freq: Once | INTRAVENOUS | Status: AC
  Administered 2022-02-25: 200 mg via INTRAVENOUS
  Filled 2022-02-25: qty 200

## 2022-02-25 MED ORDER — HEPARIN SOD (PORK) LOCK FLUSH 100 UNIT/ML IV SOLN
500.0000 [IU] | Freq: Once | INTRAVENOUS | Status: AC | PRN
  Administered 2022-02-25: 500 [IU]

## 2022-02-25 MED ORDER — SODIUM CHLORIDE 0.9% FLUSH
10.0000 mL | INTRAVENOUS | Status: DC | PRN
  Administered 2022-02-25: 10 mL

## 2022-02-25 NOTE — Progress Notes (Signed)
[place]   [phone] [phone]   Clinic Follow up Note   Patient Care Team: Billie Ruddy, MD as PCP - General (Family Medicine) Erroll Luna, MD as Consulting Physician (General Surgery) Truitt Merle, MD as Consulting Physician (Hematology) Gery Pray, MD as Consulting Physician (Radiation Oncology)  Date of Service:  02/25/2022  CHIEF COMPLAINT: f/u of left breast cancer  CURRENT THERAPY:  [name], starting 09/09/21  ASSESSMENT & PLAN:  Cheyenne [name] is a 41 y.o. pre-menopausal female with   1. Malignant neoplasm of lower-inner quadrant of left breast, Stage IIIA, c(T2, N1), Functionally triple negative, Grade 3 -presented with palpable left breast mass. B/l MM and left [place] on 08/07/21 showed: 2.8 cm left breast mass at 7:30; at least 4 abnormal lymph nodes. Biopsy that day confirmed IDC in breast but node was negative. -breast MRI on 08/26/21 and PET on 08/31/21 were negative aside from primary mass. -Her breast cancer is ER 2% positive, PR and HER2 negative, this is similar to triple negative breast cancer.   -port placed 09/08/21 -she received neoadjuvant carbo/taxol and keytruda on 09/09/21 - 11/25/21; her breast mass became no longer palpable after cycle 3. She then completed Epirubicin/cytoxan 12/02/21 - 02/03/22 (she had allergic reaction to Adriamycin), tolerated moderately well overall. -post-treatment breast MRI on 02/05/22 showed near complete resolution of known left breast cancer. I reviewed with her -she will continue Keytruda today and every 3 weeks to complete a year. Labs reviewed, hgb stable at 9. WBC, plt, and ANC improved to WNL. -she is scheduled for lumpectomy on 03/16/22 with Dr. Brantley Stage.  I will see her 1 week after surgery to review her surgical pathology.  If she has significant residual disease, I will recommend 6 months of adjuvant oral chemo Xeloda.  I discussed with her.   2. Anemia -secondary to chemo; she has  required blood transfusion several times throughout treatment -hgb stable at 9 today (02/25/22)   3. Social Support -she has good support from her wife. -she has a history of anxiety and is on Xanax as needed. I advised her to f/u with her counselor.   4. Genetics  -negative    PLAN:  -proceed with Keytruda today -lumpectomy 03/16/22 -lab, flush, f/u, and Keytruda in 4 weeks   No problem-specific Assessment & Plan notes found for this encounter.   SUMMARY OF ONCOLOGIC HISTORY: Oncology History Overview Note   Cancer Staging  Malignant neoplasm of lower-inner quadrant of left breast in female, estrogen receptor positive ([place]) Staging form: Breast, AJCC 8th Edition - Clinical stage from 08/07/2021: Stage IIIA (cT2, cN1, cM0, G3, ER+, PR-, HER2-) - Signed by Truitt Merle, MD on 08/18/2021    Malignant neoplasm of lower-inner quadrant of left breast in female, estrogen receptor positive ([place])  08/07/2021 Cancer Staging   Staging form: Breast, AJCC 8th Edition - Clinical stage from 08/07/2021: Stage IIB (cT2, cN0, cM0, G3, ER-, PR-, HER2-) - Signed by Truitt Merle, MD on 09/24/2021 Stage prefix: Initial diagnosis Histologic grading system: 3 grade system   08/07/2021 Mammogram   CLINICAL DATA:  Palpable mass in the LEFT breast since August.   EXAM: DIGITAL DIAGNOSTIC BILATERAL MAMMOGRAM WITH TOMOSYNTHESIS AND CAD; ULTRASOUND LEFT BREAST LIMITED  IMPRESSION: 1. Suspicious mass in the 7:30 o'clock location of the LEFT breast for which biopsy is recommended. 2. LEFT axillary adenopathy, with at least 4 abnormal lymph nodes.   08/07/2021 Initial Biopsy   Diagnosis 1. Breast, left, needle core biopsy,  7:30, ribbon clip - INVASIVE DUCTAL CARCINOMA - SEE COMMENT 2. Lymph node, needle/core biopsy, left axilla, coil clip - NO CARCINOMA IDENTIFIED Microscopic Comment 1. based on the biopsy, the carcinoma appears Nottingham grade 3 of 3 and measures 1.1 cm in greatest linear extent.  1.  PROGNOSTIC INDICATORS Results: The tumor cells are EQUIVOCAL for Her2 (2+). Her2 by FISH will be performed and results reported separately. Estrogen Receptor: 2%, POSITIVE, WEAK STAINING INTENSITY Progesterone Receptor: <1%, NEGATIVE Proliferation Marker Ki67: 40%  1. FLUORESCENCE IN-SITU HYBRIDIZATION Results: GROUP 5: HER2 **NEGATIVE**   08/17/2021 Initial Diagnosis   Malignant neoplasm of lower-inner quadrant of left breast in female, estrogen receptor positive ([place])   08/19/2021 Genetic Testing   [facility] CancerNext-Expanded Panel is Negative. Report date is 09/02/2021.  The CancerNext-Expanded gene panel offered by [facility] and includes sequencing, rearrangement, and RNA analysis for the following 77 genes: AIP, ALK, APC, ATM, AXIN2, BAP1, BARD1, BLM, BMPR1A, BRCA1, BRCA2, BRIP1, CDC73, CDH1, CDK4, CDKN1B, CDKN2A, CHEK2, CTNNA1, DICER1, FANCC, FH, FLCN, GALNT12, KIF1B, LZTR1, MAX, MEN1, MET, MLH1, MSH2, MSH3, MSH6, MUTYH, NBN, NF1, NF2, NTHL1, PALB2, PHOX2B, PMS2, POT1, PRKAR1A, PTCH1, PTEN, RAD51C, RAD51D, RB1, RECQL, RET, SDHA, SDHAF2, SDHB, SDHC, SDHD, SMAD4, SMARCA4, SMARCB1, SMARCE1, STK11, SUFU, TMEM127, TP53, TSC1, TSC2, VHL and XRCC2 (sequencing and deletion/duplication); EGFR, EGLN1, HOXB13, KIT, MITF, PDGFRA, POLD1, and POLE (sequencing only); EPCAM and GREM1 (deletion/duplication only).    08/26/2021 Imaging   EXAM: BILATERAL BREAST MRI WITH AND WITHOUT CONTRAST  IMPRESSION: 2.3 cm mass in the lower-inner quadrant of the left breast corresponding with the biopsy proven invasive ductal carcinoma.   08/31/2021 PET scan   IMPRESSION: 1. Known mass medially in the left breast is markedly hypermetabolic consistent with breast cancer. 2. No evidence of nodal metastases within the chest. No typical distant metastases. 3. Suspected acute inflammatory process involving central mesentery and pelvis with ill-defined focal hypermetabolic activity anterior to the sacrum. There are  pelvic inflammatory changes and free pelvic fluid. Recent labs demonstrate mild leukocytosis. Differential considerations include acute appendicitis, enteritis and small perforated foreign body. No evidence of drainable fluid collection, pneumoperitoneum or bowel obstruction. 4. These results were called by telephone at the time of interpretation on 08/31/2021 at 5:12 pm to on-call oncology nurse, [name], who verbally acknowledged these results. Unless there is a good clinical explanation for these findings, evaluation in the emergency department is likely warranted, and patient may benefit from abdominopelvic CT with oral and intravenous contrast.   09/01/2021 Imaging   EXAM: CT ABDOMEN AND PELVIS WITH CONTRAST  IMPRESSION: Multiple thick-walled loops of distal small bowel, suggesting infectious/inflammatory enteritis. Associated small volume pelvic ascites. No pneumatosis or free air.   No evidence of bowel obstruction.  Normal appendix.   2.1 cm lesion in the medial left breast, likely corresponding to the patient's known primary breast neoplasm. No findings suspicious for metastatic disease.   09/09/2021 -  Chemotherapy   Patient is on Treatment Plan : BREAST Pembrolizumab (200) D1 + Carboplatin (5) D1 + Paclitaxel (80) D1,8,15 q21d X 4 cycles / Pembrolizumab (200) D1 + AC D1 q21d x 4 cycles        INTERVAL HISTORY:  Cheyenne [name] is here for a follow up of breast cancer. She was last seen by me on 01/29/22. She presents to the clinic accompanied by her wife. She reports she is doing well today, no new concerns.   All other systems were reviewed with the patient and are negative.  MEDICAL HISTORY:  Past Medical History:  Diagnosis Date   Anal fissure    Anxiety    Breast cancer ([place]) 08/10/21   Date diagnosis was given   DVT (deep venous thrombosis) ([place])    Incomplete right bundle branch block (RBBB) determined by electrocardiography 09/01/2021    SURGICAL  HISTORY: Past Surgical History:  Procedure Laterality Date   NO PAST SURGERIES     PORTACATH PLACEMENT N/A 09/08/2021   Procedure: INSERTION PORT-A-CATH;  Surgeon: Erroll Luna, MD;  Location: [facility] ORS;  Service: General;  Laterality: N/A;    I have reviewed the social history and family history with the patient and they are unchanged from previous note.  ALLERGIES:  has No Known Allergies.  MEDICATIONS:  Current Outpatient Medications  Medication Sig Dispense Refill   acetaminophen (TYLENOL) 500 MG tablet Take 500-1,000 mg by mouth every 6 (six) hours as needed (pain.).     albuterol (VENTOLIN HFA) 108 (90 Base) MCG/ACT inhaler Inhale 2 puffs into the lungs every 4 (four) hours as needed for wheezing. 1 each 5   ALPRAZolam (XANAX) 0.5 MG tablet Take one tablet twice daily as needed for anxiety. 45 tablet 2   cyclobenzaprine (FLEXERIL) 10 MG tablet Take 10 mg by mouth daily as needed for muscle spasms.     desonide (DESOWEN) 0.05 % cream Apply topically 2 (two) times daily. (Patient taking differently: Apply 1 application. topically 2 (two) times daily as needed (skin irritation.).) 30 g 1   desonide (DESOWEN) 0.05 % lotion Apply 1 application. topically daily as needed (acne).     EPINEPHrine 0.3 mg/0.3 mL IJ SOAJ injection Inject 0.3 mg into the muscle as needed for anaphylaxis. 1 each 0   hydroquinone 4 % cream APPLY TO DARK SPOTS AT BEDTIME (Patient taking differently: Apply 1 application. topically daily as needed (dark spots). APPLY TO DARK SPOTS AT BEDTIME) 28.35 g 0   ibuprofen (ADVIL) 800 MG tablet Take 1 tablet (800 mg total) by mouth every 8 (eight) hours as needed. 30 tablet 0   KLOR-CON M20 20 MEQ tablet TAKE 1 TABLET BY MOUTH TWICE A DAY 60 tablet 0   lidocaine-prilocaine (EMLA) cream Apply to affected area once 30 g 3   loratadine (CLARITIN) 10 MG tablet Take 10 mg by mouth daily.     magic mouthwash (nystatin, diphenhydrAMINE, alum & mag hydroxide) suspension mixture Swish  and swallow 5 mLs by mouth 3 (three) times daily as needed for mouth pain. 140 mL 1   Multiple Vitamins-Minerals (ONE A DAY IMMUNITY DEFENSE) CHEW Chew 1 tablet by mouth daily.     ondansetron (ZOFRAN) 8 MG tablet Take 1 tablet (8 mg total) by mouth 2 (two) times daily as needed. Start on the third day after carboplatin and AC chemotherapy. 30 tablet 1   oxyCODONE (OXY IR/ROXICODONE) 5 MG immediate release tablet Take 1 tablet (5 mg total) by mouth every 6 (six) hours as needed for severe pain. (Patient not taking: Reported on 12/02/2021) 15 tablet 0   prochlorperazine (COMPAZINE) 10 MG tablet Take 1 tablet (10 mg total) by mouth every 6 (six) hours as needed (Nausea or vomiting). 30 tablet 1   traMADol (ULTRAM) 50 MG tablet Take 1 tablet (50 mg total) by mouth every 6 (six) hours as needed. (Patient taking differently: Take 50 mg by mouth every 6 (six) hours as needed for moderate pain.) 5 tablet 0   No current facility-administered medications for this visit.    PHYSICAL EXAMINATION: ECOG PERFORMANCE STATUS: 1 -  Symptomatic but completely ambulatory  Vitals:   02/25/22 1010  BP: 103/62  Pulse: 85  Resp: 18  Temp: 98.4 F (36.9 C)  SpO2: 100%   Wt Readings from Last 3 Encounters:  02/25/22 184 lb 3.2 oz (83.6 kg)  02/03/22 189 lb 12.8 oz (86.1 kg)  01/29/22 187 lb 12.8 oz (85.2 kg)     GENERAL:alert, no distress and comfortable SKIN: skin color normal, no rashes or significant lesions EYES: normal, Conjunctiva are pink and non-injected, sclera clear  NEURO: alert & oriented x 3 with fluent speech  LABORATORY DATA:  I have reviewed the data as listed    Latest Ref Rng & Units 02/25/2022    9:56 AM 02/12/2022    8:52 AM 02/03/2022   11:33 AM  CBC  WBC 4.0 - 10.5 K/uL 4.5  1.0  6.4   Hemoglobin 12.0 - 15.0 g/dL 9.0  9.1  9.9   Hematocrit 36.0 - 46.0 % 26.0  26.8  28.9   Platelets 150 - 400 K/uL 275  90  323         Latest Ref Rng & Units 02/25/2022    9:56 AM 02/12/2022     8:52 AM 02/03/2022   11:33 AM  CMP  Glucose 70 - 99 mg/dL 121  103  115   BUN 6 - 20 mg/dL 11  16  17    Creatinine 0.44 - 1.00 mg/dL 0.85  0.91  0.87   Sodium 135 - 145 mmol/L 140  140  138   Potassium 3.5 - 5.1 mmol/L 3.6  3.8  3.5   Chloride 98 - 111 mmol/L 105  105  104   CO2 22 - 32 mmol/L 27  27  28    Calcium 8.9 - 10.3 mg/dL 9.5  9.9  9.6   Total Protein 6.5 - 8.1 g/dL 7.6  7.8  7.6   Total Bilirubin 0.3 - 1.2 mg/dL 0.3  0.6  0.4   Alkaline Phos 38 - 126 U/L 93  100  86   AST 15 - 41 U/L 38  18  24   ALT 0 - 44 U/L 47  17  25       RADIOGRAPHIC STUDIES: I have personally reviewed the radiological images as listed and agreed with the findings in the report. No results found.    No orders of the defined types were placed in this encounter.  All questions were answered. The patient knows to call the clinic with any problems, questions or concerns. No barriers to learning was detected. The total time spent in the appointment was 30 minutes.     Truitt Merle, MD 02/25/2022   I, Wilburn Mylar, am acting as scribe for Truitt Merle, MD.   I have reviewed the above documentation for accuracy and completeness, and I agree with the above.

## 2022-02-25 NOTE — Patient Instructions (Signed)
[facility] MEDICAL [facility]COLOGY  Discharge Instructions: Thank you for choosing [facility] to provide your oncology and hematology care.   If you have a lab appointment with the Cancer Center, please go directly to the Cancer Center and check in at the registration area.   Wear comfortable clothing and clothing appropriate for easy access to any Portacath or PICC line.   We strive to give you quality time with your provider. You may need to reschedule your appointment if you arrive late (15 or more minutes).  Arriving late affects you and other patients whose appointments are after yours.  Also, if you miss three or more appointments without notifying the office, you may be dismissed from the clinic at the provider's discretion.      For prescription refill requests, have your pharmacy contact our office and allow 72 hours for refills to be completed.    Today you received the following chemotherapy and/or immunotherapy agents: Keytruda      To help prevent nausea and vomiting after your treatment, we encourage you to take your nausea medication as directed.  BELOW ARE SYMPTOMS THAT SHOULD BE REPORTED IMMEDIATELY: *FEVER GREATER THAN 100.4 F (38 C) OR HIGHER *CHILLS OR SWEATING *NAUSEA AND VOMITING THAT IS NOT CONTROLLED WITH YOUR NAUSEA MEDICATION *UNUSUAL SHORTNESS OF BREATH *UNUSUAL BRUISING OR BLEEDING *URINARY PROBLEMS (pain or burning when urinating, or frequent urination) *BOWEL PROBLEMS (unusual diarrhea, constipation, pain near the anus) TENDERNESS IN MOUTH AND THROAT WITH OR WITHOUT PRESENCE OF ULCERS (sore throat, sores in mouth, or a toothache) UNUSUAL RASH, SWELLING OR PAIN  UNUSUAL VAGINAL DISCHARGE OR ITCHING   Items with * indicate a potential emergency and should be followed up as soon as possible or go to the Emergency Department if any problems should occur.  Please show the CHEMOTHERAPY ALERT CARD or IMMUNOTHERAPY ALERT CARD at check-in to  the Emergency Department and triage nurse.  Should you have questions after your visit or need to cancel or reschedule your appointment, please contact [facility]  Dept: [phone]  and follow the prompts.  Office hours are 8:00 a.m. to 4:30 p.m. Monday - Friday. Please note that voicemails left after 4:00 p.m. may not be returned until the following business day.  We are closed weekends and major holidays. You have access to a nurse at all times for urgent questions. Please call the main number to the clinic Dept: [phone] and follow the prompts.   For any non-urgent questions, you may also contact your provider using MyChart. We now offer e-Visits for anyone 18 and older to request care online for non-urgent symptoms. For details visit [email or web address].   Also download the MyChart app! Go to the app store, search "MyChart", open the app, select [facility], and log in with your MyChart username and password.  Masks are optional in the cancer centers. If you would like for your care team to wear a mask while they are taking care of you, please let them know. For doctor visits, patients may have with them one support person who is at least 40 years old. At this time, visitors are not allowed in the infusion area. 

## 2022-03-02 ENCOUNTER — Telehealth: Payer: Self-pay | Admitting: Hematology

## 2022-03-02 ENCOUNTER — Other Ambulatory Visit: Payer: Self-pay

## 2022-03-02 NOTE — Telephone Encounter (Signed)
Scheduled follow-up appointment per 7/27 los. Patient is aware. 

## 2022-03-03 ENCOUNTER — Other Ambulatory Visit: Payer: Self-pay

## 2022-03-08 ENCOUNTER — Encounter: Payer: Self-pay | Admitting: *Deleted

## 2022-03-09 ENCOUNTER — Encounter (HOSPITAL_BASED_OUTPATIENT_CLINIC_OR_DEPARTMENT_OTHER): Payer: Self-pay | Admitting: Surgery

## 2022-03-09 ENCOUNTER — Other Ambulatory Visit: Payer: Self-pay

## 2022-03-11 ENCOUNTER — Other Ambulatory Visit: Payer: Self-pay

## 2022-03-12 ENCOUNTER — Other Ambulatory Visit: Payer: Self-pay

## 2022-03-13 ENCOUNTER — Other Ambulatory Visit: Payer: Self-pay | Admitting: Hematology

## 2022-03-15 ENCOUNTER — Encounter (HOSPITAL_BASED_OUTPATIENT_CLINIC_OR_DEPARTMENT_OTHER): Payer: Self-pay | Admitting: Surgery

## 2022-03-15 ENCOUNTER — Ambulatory Visit
Admission: RE | Admit: 2022-03-15 | Discharge: 2022-03-15 | Disposition: A | Discharge status: Home / Self Care | Payer: Federal, State, Local not specified - PPO | Source: Ambulatory Visit | Attending: Surgery | Admitting: Surgery

## 2022-03-15 DIAGNOSIS — R928 Other abnormal and inconclusive findings on diagnostic imaging of breast: Secondary | ICD-10-CM | POA: Diagnosis not present

## 2022-03-15 DIAGNOSIS — C50912 Malignant neoplasm of unspecified site of left female breast: Secondary | ICD-10-CM

## 2022-03-15 NOTE — Anesthesia Preprocedure Evaluation (Signed)
Anesthesia Evaluation  Patient identified by MRN, date of birth, ID band Patient awake    Reviewed: Allergy & Precautions, NPO status , Patient's Chart, lab work & pertinent test results  Airway Mallampati: III  TM Distance: >3 FB Neck ROM: Full    Dental  (+) Teeth Intact, Dental Advisory Given   Pulmonary neg pulmonary ROS,    Pulmonary exam normal breath sounds clear to auscultation       Cardiovascular negative cardio ROS Normal cardiovascular exam Rhythm:Regular Rate:Normal  EKG 09/01/21 NSR, PAC's, incomplete RBBB pattern  Echo 08/28/21 1. Left ventricular ejection fraction, by estimation, is 55 to 60%. The left ventricle has normal function. The left ventricle has no regional wall motion abnormalities. There is mild concentric left ventricular hypertrophy. Left ventricular diastolic parameters were normal.  2. Right ventricular systolic function is normal. The right ventricular size is normal.  3. The mitral valve is normal in structure. No evidence of mitral valve regurgitation. No evidence of mitral stenosis.  4. The aortic valve is normal in structure. Aortic valve regurgitation is trivial. No aortic stenosis is present.  5. The inferior vena cava is normal in size with greater than 50% respiratory variability, suggesting right atrial pressure of 3 mmHg.    Neuro/Psych Anxiety negative neurological ROS     GI/Hepatic negative GI ROS, Neg liver ROS,   Endo/Other  Obesity HLD Left Breast Ca  Renal/GU negative Renal ROS  negative genitourinary   Musculoskeletal negative musculoskeletal ROS (+)   Abdominal (+) + obese,   Peds  Hematology  (+) Blood dyscrasia, anemia ,   Anesthesia Other Findings   Reproductive/Obstetrics                            Anesthesia Physical Anesthesia Plan  ASA: 2  Anesthesia Plan: General   Post-op Pain Management: Regional block* and Minimal or no  pain anticipated   Induction: Intravenous  PONV Risk Score and Plan: 4 or greater and Treatment may vary due to age or medical condition, Midazolam, Ondansetron and Dexamethasone  Airway Management Planned: LMA  Additional Equipment: None  Intra-op Plan:   Post-operative Plan: Extubation in OR  Informed Consent: I have reviewed the patients History and Physical, chart, labs and discussed the procedure including the risks, benefits and alternatives for the proposed anesthesia with the patient or authorized representative who has indicated his/her understanding and acceptance.     Dental advisory given  Plan Discussed with: CRNA and Anesthesiologist  Anesthesia Plan Comments:        Anesthesia Quick Evaluation

## 2022-03-15 NOTE — Progress Notes (Signed)

## 2022-03-16 ENCOUNTER — Other Ambulatory Visit: Payer: Self-pay

## 2022-03-16 ENCOUNTER — Ambulatory Visit (HOSPITAL_BASED_OUTPATIENT_CLINIC_OR_DEPARTMENT_OTHER): Payer: Federal, State, Local not specified - PPO | Admitting: Anesthesiology

## 2022-03-16 ENCOUNTER — Ambulatory Visit (HOSPITAL_BASED_OUTPATIENT_CLINIC_OR_DEPARTMENT_OTHER)
Admission: RE | Admit: 2022-03-16 | Discharge: 2022-03-16 | Disposition: A | Discharge status: Home / Self Care | Payer: Federal, State, Local not specified - PPO | Attending: Surgery | Admitting: Surgery

## 2022-03-16 ENCOUNTER — Encounter (HOSPITAL_BASED_OUTPATIENT_CLINIC_OR_DEPARTMENT_OTHER): Payer: Self-pay | Admitting: Surgery

## 2022-03-16 ENCOUNTER — Encounter (HOSPITAL_BASED_OUTPATIENT_CLINIC_OR_DEPARTMENT_OTHER)
Admission: RE | Disposition: A | Discharge status: Home / Self Care | Payer: Self-pay | Source: Home / Self Care | Attending: Surgery

## 2022-03-16 ENCOUNTER — Ambulatory Visit
Admission: RE | Admit: 2022-03-16 | Discharge: 2022-03-16 | Disposition: A | Discharge status: Home / Self Care | Payer: Federal, State, Local not specified - PPO | Source: Ambulatory Visit | Attending: Surgery | Admitting: Surgery

## 2022-03-16 DIAGNOSIS — Z9221 Personal history of antineoplastic chemotherapy: Secondary | ICD-10-CM | POA: Diagnosis not present

## 2022-03-16 DIAGNOSIS — E669 Obesity, unspecified: Secondary | ICD-10-CM | POA: Diagnosis not present

## 2022-03-16 DIAGNOSIS — C50312 Malignant neoplasm of lower-inner quadrant of left female breast: Secondary | ICD-10-CM | POA: Diagnosis not present

## 2022-03-16 DIAGNOSIS — C50912 Malignant neoplasm of unspecified site of left female breast: Secondary | ICD-10-CM

## 2022-03-16 DIAGNOSIS — T8859XA Other complications of anesthesia, initial encounter: Secondary | ICD-10-CM

## 2022-03-16 DIAGNOSIS — G8918 Other acute postprocedural pain: Secondary | ICD-10-CM | POA: Diagnosis not present

## 2022-03-16 DIAGNOSIS — Z170 Estrogen receptor positive status [ER+]: Secondary | ICD-10-CM | POA: Diagnosis not present

## 2022-03-16 DIAGNOSIS — Z171 Estrogen receptor negative status [ER-]: Secondary | ICD-10-CM | POA: Diagnosis not present

## 2022-03-16 DIAGNOSIS — Z01818 Encounter for other preprocedural examination: Secondary | ICD-10-CM

## 2022-03-16 DIAGNOSIS — R928 Other abnormal and inconclusive findings on diagnostic imaging of breast: Secondary | ICD-10-CM | POA: Diagnosis not present

## 2022-03-16 HISTORY — PX: BREAST LUMPECTOMY WITH RADIOACTIVE SEED AND SENTINEL LYMPH NODE BIOPSY: SHX6550

## 2022-03-16 HISTORY — DX: Other complications of anesthesia, initial encounter: T88.59XA

## 2022-03-16 LAB — POCT PREGNANCY, URINE: Preg Test, Ur: NEGATIVE

## 2022-03-16 SURGERY — BREAST LUMPECTOMY WITH RADIOACTIVE SEED AND SENTINEL LYMPH NODE BIOPSY
Anesthesia: General | Site: Breast | Laterality: Left

## 2022-03-16 MED ORDER — AMISULPRIDE (ANTIEMETIC) 5 MG/2ML IV SOLN: 10.0000 mg | Freq: Once | INTRAVENOUS | Status: DC | PRN

## 2022-03-16 MED ORDER — FENTANYL CITRATE (PF) 100 MCG/2ML IJ SOLN
INTRAMUSCULAR | Status: AC
  Filled 2022-03-16: qty 2

## 2022-03-16 MED ORDER — PROPOFOL 500 MG/50ML IV EMUL
INTRAVENOUS | Status: AC
  Filled 2022-03-16: qty 100

## 2022-03-16 MED ORDER — BUPIVACAINE LIPOSOME 1.3 % IJ SUSP
INTRAMUSCULAR | Status: DC | PRN
  Administered 2022-03-16: 10 mL via PERINEURAL

## 2022-03-16 MED ORDER — OXYCODONE HCL 5 MG/5ML PO SOLN: 5.0000 mg | Freq: Once | ORAL | Status: DC | PRN

## 2022-03-16 MED ORDER — BUPIVACAINE-EPINEPHRINE (PF) 0.5% -1:200000 IJ SOLN
INTRAMUSCULAR | Status: DC | PRN
  Administered 2022-03-16: 20 mL via PERINEURAL

## 2022-03-16 MED ORDER — DEXAMETHASONE SODIUM PHOSPHATE 4 MG/ML IJ SOLN
INTRAMUSCULAR | Status: DC | PRN
  Administered 2022-03-16: 4 mg via INTRAVENOUS

## 2022-03-16 MED ORDER — PROPOFOL 500 MG/50ML IV EMUL
INTRAVENOUS | Status: DC | PRN
  Administered 2022-03-16: 150 ug/kg/min via INTRAVENOUS

## 2022-03-16 MED ORDER — FENTANYL CITRATE (PF) 100 MCG/2ML IJ SOLN
INTRAMUSCULAR | Status: DC | PRN
  Administered 2022-03-16 (×2): 50 ug via INTRAVENOUS
  Administered 2022-03-16: 25 ug via INTRAVENOUS

## 2022-03-16 MED ORDER — ONDANSETRON HCL 4 MG/2ML IJ SOLN: 4.0000 mg | Freq: Once | INTRAMUSCULAR | Status: DC | PRN

## 2022-03-16 MED ORDER — MAGTRACE LYMPHATIC TRACER
INTRAMUSCULAR | Status: DC | PRN
  Administered 2022-03-16: 2 mL via INTRAMUSCULAR

## 2022-03-16 MED ORDER — BUPIVACAINE-EPINEPHRINE (PF) 0.25% -1:200000 IJ SOLN
INTRAMUSCULAR | Status: AC
  Filled 2022-03-16: qty 150

## 2022-03-16 MED ORDER — PHENYLEPHRINE HCL (PRESSORS) 10 MG/ML IV SOLN
INTRAVENOUS | Status: DC | PRN
  Administered 2022-03-16: 80 ug via INTRAVENOUS
  Administered 2022-03-16 (×2): 40 ug via INTRAVENOUS

## 2022-03-16 MED ORDER — SODIUM CHLORIDE 0.9 % IV SOLN
INTRAVENOUS | Status: DC | PRN
  Administered 2022-03-16: 500 mL

## 2022-03-16 MED ORDER — ACETAMINOPHEN 500 MG PO TABS
ORAL_TABLET | ORAL | Status: AC
  Filled 2022-03-16: qty 2

## 2022-03-16 MED ORDER — BUPIVACAINE-EPINEPHRINE (PF) 0.25% -1:200000 IJ SOLN
INTRAMUSCULAR | Status: DC | PRN
  Administered 2022-03-16: 27 mL

## 2022-03-16 MED ORDER — FENTANYL CITRATE (PF) 100 MCG/2ML IJ SOLN: 25.0000 ug | INTRAMUSCULAR | Status: DC | PRN

## 2022-03-16 MED ORDER — LIDOCAINE 2% (20 MG/ML) 5 ML SYRINGE
INTRAMUSCULAR | Status: AC
  Filled 2022-03-16: qty 25

## 2022-03-16 MED ORDER — PROPOFOL 10 MG/ML IV BOLUS
INTRAVENOUS | Status: DC | PRN
  Administered 2022-03-16: 200 mg via INTRAVENOUS

## 2022-03-16 MED ORDER — CEFAZOLIN SODIUM-DEXTROSE 2-4 GM/100ML-% IV SOLN
INTRAVENOUS | Status: AC
  Filled 2022-03-16: qty 100

## 2022-03-16 MED ORDER — LIDOCAINE HCL (CARDIAC) PF 100 MG/5ML IV SOSY
PREFILLED_SYRINGE | INTRAVENOUS | Status: DC | PRN
  Administered 2022-03-16: 30 mg via INTRAVENOUS

## 2022-03-16 MED ORDER — MIDAZOLAM HCL 2 MG/2ML IJ SOLN
2.0000 mg | Freq: Once | INTRAMUSCULAR | Status: AC
  Administered 2022-03-16: 2 mg via INTRAVENOUS

## 2022-03-16 MED ORDER — CHLORHEXIDINE GLUCONATE CLOTH 2 % EX PADS: 6.0000 | MEDICATED_PAD | Freq: Once | CUTANEOUS | Status: DC

## 2022-03-16 MED ORDER — LACTATED RINGERS IV SOLN
INTRAVENOUS | Status: DC
Start: 2022-03-16 — End: 2022-03-16

## 2022-03-16 MED ORDER — SODIUM CHLORIDE 0.9 % IV SOLN
INTRAVENOUS | Status: AC
  Filled 2022-03-16 (×4): qty 10

## 2022-03-16 MED ORDER — OXYCODONE HCL 5 MG PO TABS: 5.0000 mg | ORAL_TABLET | Freq: Once | ORAL | Status: DC | PRN

## 2022-03-16 MED ORDER — OXYCODONE HCL 5 MG PO TABS: 5.0000 mg | ORAL_TABLET | Freq: Four times a day (QID) | ORAL | 0 refills | Status: DC | PRN

## 2022-03-16 MED ORDER — CEFAZOLIN SODIUM-DEXTROSE 2-4 GM/100ML-% IV SOLN
2.0000 g | INTRAVENOUS | Status: AC
  Administered 2022-03-16: 2 g via INTRAVENOUS

## 2022-03-16 MED ORDER — ACETAMINOPHEN 500 MG PO TABS
1000.0000 mg | ORAL_TABLET | ORAL | Status: AC
  Administered 2022-03-16: 1000 mg via ORAL

## 2022-03-16 MED ORDER — MIDAZOLAM HCL 2 MG/2ML IJ SOLN
INTRAMUSCULAR | Status: AC
  Filled 2022-03-16: qty 2

## 2022-03-16 MED ORDER — DEXAMETHASONE SODIUM PHOSPHATE 10 MG/ML IJ SOLN
INTRAMUSCULAR | Status: AC
  Filled 2022-03-16: qty 3

## 2022-03-16 MED ORDER — PROPOFOL 10 MG/ML IV BOLUS
INTRAVENOUS | Status: AC
  Filled 2022-03-16: qty 20

## 2022-03-16 MED ORDER — IBUPROFEN 800 MG PO TABS: 800.0000 mg | ORAL_TABLET | Freq: Three times a day (TID) | ORAL | 0 refills | Status: DC | PRN

## 2022-03-16 MED ORDER — FENTANYL CITRATE (PF) 100 MCG/2ML IJ SOLN
100.0000 ug | Freq: Once | INTRAMUSCULAR | Status: AC
  Administered 2022-03-16: 100 ug via INTRAVENOUS

## 2022-03-16 MED ORDER — ONDANSETRON HCL 4 MG/2ML IJ SOLN
INTRAMUSCULAR | Status: AC
  Filled 2022-03-16: qty 22

## 2022-03-16 MED ORDER — ONDANSETRON HCL 4 MG/2ML IJ SOLN
INTRAMUSCULAR | Status: DC | PRN
  Administered 2022-03-16: 4 mg via INTRAVENOUS

## 2022-03-16 SURGICAL SUPPLY — 55 items
ADH SKN CLS APL DERMABOND .7 (GAUZE/BANDAGES/DRESSINGS) ×1
APL PRP STRL LF DISP 70% ISPRP (MISCELLANEOUS) ×1
APPLIER CLIP 9.375 MED OPEN (MISCELLANEOUS) ×2
APR CLP MED 9.3 20 MLT OPN (MISCELLANEOUS) ×1
BAG DECANTER FOR FLEXI CONT (MISCELLANEOUS) ×1 IMPLANT
BINDER BREAST LRG (GAUZE/BANDAGES/DRESSINGS) IMPLANT
BINDER BREAST MEDIUM (GAUZE/BANDAGES/DRESSINGS) IMPLANT
BINDER BREAST XLRG (GAUZE/BANDAGES/DRESSINGS) ×1 IMPLANT
BINDER BREAST XXLRG (GAUZE/BANDAGES/DRESSINGS) IMPLANT
BLADE SURG 15 STRL LF DISP TIS (BLADE) ×1 IMPLANT
BLADE SURG 15 STRL SS (BLADE) ×2
CANISTER SUC SOCK COL 7IN (MISCELLANEOUS) IMPLANT
CANISTER SUCT 1200ML W/VALVE (MISCELLANEOUS) ×2 IMPLANT
CHLORAPREP W/TINT 26 (MISCELLANEOUS) ×2 IMPLANT
CLIP APPLIE 9.375 MED OPEN (MISCELLANEOUS) ×1 IMPLANT
COVER BACK TABLE 60X90IN (DRAPES) ×2 IMPLANT
COVER MAYO STAND STRL (DRAPES) ×2 IMPLANT
COVER PROBE W GEL 5X96 (DRAPES) ×2 IMPLANT
DERMABOND ADVANCED (GAUZE/BANDAGES/DRESSINGS) ×1
DERMABOND ADVANCED .7 DNX12 (GAUZE/BANDAGES/DRESSINGS) ×1 IMPLANT
DRAPE LAPAROSCOPIC ABDOMINAL (DRAPES) ×2 IMPLANT
DRAPE UTILITY XL STRL (DRAPES) ×2 IMPLANT
ELECT COATED BLADE 2.86 ST (ELECTRODE) ×2 IMPLANT
ELECT REM PT RETURN 9FT ADLT (ELECTROSURGICAL) ×2
ELECTRODE REM PT RTRN 9FT ADLT (ELECTROSURGICAL) ×1 IMPLANT
GLOVE BIOGEL PI IND STRL 7.0 (GLOVE) IMPLANT
GLOVE BIOGEL PI IND STRL 8 (GLOVE) ×1 IMPLANT
GLOVE BIOGEL PI INDICATOR 7.0 (GLOVE) ×1
GLOVE BIOGEL PI INDICATOR 8 (GLOVE) ×1
GLOVE ECLIPSE 8.0 STRL XLNG CF (GLOVE) ×2 IMPLANT
GOWN STRL REUS W/ TWL LRG LVL3 (GOWN DISPOSABLE) ×2 IMPLANT
GOWN STRL REUS W/ TWL XL LVL3 (GOWN DISPOSABLE) ×1 IMPLANT
GOWN STRL REUS W/TWL LRG LVL3 (GOWN DISPOSABLE) ×4
GOWN STRL REUS W/TWL XL LVL3 (GOWN DISPOSABLE) ×2
HEMOSTAT ARISTA ABSORB 3G PWDR (HEMOSTASIS) ×1 IMPLANT
HEMOSTAT SNOW SURGICEL 2X4 (HEMOSTASIS) IMPLANT
KIT MARKER MARGIN INK (KITS) ×2 IMPLANT
NDL HYPO 25X1 1.5 SAFETY (NEEDLE) ×1 IMPLANT
NDL SAFETY ECLIPSE 18X1.5 (NEEDLE) IMPLANT
NEEDLE HYPO 18GX1.5 SHARP (NEEDLE)
NEEDLE HYPO 25X1 1.5 SAFETY (NEEDLE) ×2 IMPLANT
NS IRRIG 1000ML POUR BTL (IV SOLUTION) ×1 IMPLANT
PACK BASIN DAY SURGERY FS (CUSTOM PROCEDURE TRAY) ×2 IMPLANT
PENCIL SMOKE EVACUATOR (MISCELLANEOUS) ×2 IMPLANT
SLEEVE SCD COMPRESS KNEE MED (STOCKING) ×2 IMPLANT
SPIKE FLUID TRANSFER (MISCELLANEOUS) IMPLANT
SPONGE T-LAP 4X18 ~~LOC~~+RFID (SPONGE) ×3 IMPLANT
SUT MNCRL AB 4-0 PS2 18 (SUTURE) ×3 IMPLANT
SUT VICRYL 3-0 CR8 SH (SUTURE) ×3 IMPLANT
SYR CONTROL 10ML LL (SYRINGE) ×2 IMPLANT
TOWEL GREEN STERILE FF (TOWEL DISPOSABLE) ×2 IMPLANT
TRACER MAGTRACE VIAL (MISCELLANEOUS) ×1 IMPLANT
TRAY FAXITRON CT DISP (TRAY / TRAY PROCEDURE) ×2 IMPLANT
TUBE CONNECTING 20X1/4 (TUBING) ×2 IMPLANT
YANKAUER SUCT BULB TIP NO VENT (SUCTIONS) ×2 IMPLANT

## 2022-03-16 NOTE — Anesthesia Procedure Notes (Addendum)
  Anesthesia Regional Block: Pectoralis block   Pre-Anesthetic Checklist: , timeout performed,  Correct Patient, Correct Site, Correct Laterality,  Correct Procedure, Correct Position, site marked,  Risks and benefits discussed,  Surgical consent,  Pre-op evaluation,  At surgeon's request and post-op pain management  Laterality: Left  Prep: chloraprep       Needles:  Injection technique: Single-shot  Needle Type: Echogenic Stimulator Needle     Needle Length: 10cm  Needle Gauge: 21     Additional Needles:   Procedures:,,,, ultrasound used (permanent image in chart),,    Narrative:  Start time: 03/16/2022 7:22 AM End time: 03/16/2022 7:27 AM Injection made incrementally with aspirations every 5 mL.  Performed by: Personally  Anesthesiologist: Josephine Igo, MD  Additional Notes: Timeout performed. Patient sedated. Relevant anatomy ID'd using [place]. Incremental 2-88m injection of LA with frequent aspiration. Patient tolerated procedure well.

## 2022-03-16 NOTE — Transfer of Care (Signed)
Immediate Anesthesia Transfer of Care Note  Patient: Cheyenne [name]  Procedure(s) Performed: LEFT BREAST SEED LUMPECTOMY, LEFT SENTINEL LYMPH NODE MAPPING (Left: Breast)  Patient Location: PACU  Anesthesia Type:GA combined with regional for post-op pain  Level of Consciousness: drowsy and patient cooperative  Airway & Oxygen Therapy: Patient Spontanous Breathing and Patient connected to face mask oxygen  Post-op Assessment: Report given to RN and Post -op Vital signs reviewed and stable  Post vital signs: Reviewed and stable  Last Vitals:  Vitals Value Taken Time  BP    Temp    Pulse 70 03/16/22 0907  Resp    SpO2 97 % 03/16/22 0907  Vitals shown include unvalidated device data.  Last Pain:  Vitals:   03/16/22 0631  TempSrc: Oral  PainSc: 0-No pain      Patients Stated Pain Goal: 4 (61/44/31 5400)  Complications: No notable events documented.

## 2022-03-16 NOTE — Anesthesia Procedure Notes (Signed)
Procedure Name: LMA Insertion Date/Time: 03/16/2022 7:40 AM  Performed by: Shaasia Odle, Ernesta Amble, CRNAPre-anesthesia Checklist: Patient identified, Emergency Drugs available, Suction available and Patient being monitored Patient Re-evaluated:Patient Re-evaluated prior to induction Oxygen Delivery Method: Circle system utilized Preoxygenation: Pre-oxygenation with 100% oxygen Induction Type: IV induction Ventilation: Mask ventilation without difficulty LMA: LMA inserted LMA Size: 4.0 Number of attempts: 1 Airway Equipment and Method: Bite block Placement Confirmation: positive ETCO2 Tube secured with: Tape Dental Injury: Teeth and Oropharynx as per pre-operative assessment

## 2022-03-16 NOTE — H&P (Signed)
  History of Present Illness: Cheyenne [name] is a 41 y.o. female who is seen today for patient returns for long-term follow-up after neoadjuvant chemotherapy for her essentially triple negative left breast cancer stage II. MRIs ordered for later this week but overall she has done well. She did have issues with fatigue and alopecia. She has 1 more chemotherapy this week to take and she will be done. Maintenance Keytruda was recommended in her case by her oncologist and she will keep her port for now..    Review of Systems: A complete review of systems was obtained from the patient. I have reviewed this information and discussed as appropriate with the patient. See HPI as well for other ROS.    Medical History: Past Medical History:  Diagnosis Date  Anxiety  DVT (deep venous thrombosis) (CMS-HCC)  History of cancer   There is no problem list on file for this patient.  History reviewed. No pertinent surgical history.   No Known Allergies  Current Outpatient Medications on File Prior to Visit  Medication Sig Dispense Refill  ALPRAZolam (XANAX) 0.5 MG tablet Take 0.5 mg by mouth 2 (two) times daily as needed  ALYACEN 1/35, 28, 1-35 mg-mcg tablet TAKE 1 TABLET BY MOUTH DAILY CONTINUOUSLY (NO PLACEBO TALBETS)   No current facility-administered medications on file prior to visit.   Family History  Problem Relation Age of Onset  Stroke Mother  Obesity Mother  Diabetes Mother  Obesity Sister  Diabetes Sister    Social History   Tobacco Use  Smoking Status Never  Smokeless Tobacco Never    Social History   Socioeconomic History  Marital status: Single  Tobacco Use  Smoking status: Never  Smokeless tobacco: Never  Substance and Sexual Activity  Alcohol use: Yes  Drug use: Not Currently   Objective:   There were no vitals filed for this visit.  There is no height or weight on file to calculate BMI.  Physical Exam HENT:  Head: Normocephalic.  Eyes:  Pupils:  Pupils are equal, round, and reactive to light.  Chest:  Breasts: Right: Normal. No inverted nipple, mass or nipple discharge.  Left: Normal. No inverted nipple, mass or nipple discharge.  Musculoskeletal:  General: Normal range of motion.  Skin: General: Skin is warm.  Neurological:  General: No focal deficit present.  Mental Status: She is alert.  Psychiatric:  Mood and Affect: Mood normal.  Behavior: Behavior normal.    Assessment and Plan:   Diagnoses and all orders for this visit:  Breast cancer, stage 2, left (CMS-HCC)    Patient is finishing neoadjuvant chemotherapy for stage II left breast cancer. She is due for MRI this week but she has no detectable disease on exam today. She would like to proceed with breast conserving surgery and we will schedule left breast seed localized lumpectomy with left axillary sentinel lymph node mapping. Review MRI when available this week. She will keep her port for Keytruda. Discussed the risks and benefits of surgery. Discussed risk of lymphedema, shoulder pain and discoloration of the breast due to dye use. Discussed cosmetic outcome, bleeding, infection, chronic wound problems, and less likely cardiovascular risk, organ injury, blood clots, and rarely death. Patient wishes to proceed with left breast seed localized lumpectomy with left axillary node mapping.  No follow-ups on file.  Kennieth Francois, MD

## 2022-03-16 NOTE — Discharge Instructions (Addendum)
No tylenol until 12:45 p.m.  [place] Office Phone Number [phone]  BREAST BIOPSY/ PARTIAL MASTECTOMY: POST OP INSTRUCTIONS  Always review your discharge instruction sheet given to you by the facility where your surgery was performed.  IF YOU HAVE DISABILITY OR FAMILY LEAVE FORMS, YOU MUST BRING THEM TO THE OFFICE FOR PROCESSING.  DO NOT GIVE THEM TO YOUR DOCTOR.  A prescription for pain medication may be given to you upon discharge.  Take your pain medication as prescribed, if needed.  If narcotic pain medicine is not needed, then you may take acetaminophen (Tylenol) or ibuprofen (Advil) as needed. Take your usually prescribed medications unless otherwise directed If you need a refill on your pain medication, please contact your pharmacy.  They will contact our office to request authorization.  Prescriptions will not be filled after 5pm or on week-ends. You should eat very light the first 24 hours after surgery, such as soup, crackers, pudding, etc.  Resume your normal diet the day after surgery. Most patients will experience some swelling and bruising in the breast.  Ice packs and a good support bra will help.  Swelling and bruising can take several days to resolve.  It is common to experience some constipation if taking pain medication after surgery.  Increasing fluid intake and taking a stool softener will usually help or prevent this problem from occurring.  A mild laxative (Milk of Magnesia or Miralax) should be taken according to package directions if there are no bowel movements after 48 hours. Unless discharge instructions indicate otherwise, you may remove your bandages 24-48 hours after surgery, and you may shower at that time.  You may have steri-strips (small skin tapes) in place directly over the incision.  These strips should be left on the skin for 7-10 days.  If your surgeon used skin glue on the incision, you may shower in 24 hours.  The glue will flake off  over the next 2-3 weeks.  Any sutures or staples will be removed at the office during your follow-up visit. ACTIVITIES:  You may resume regular daily activities (gradually increasing) beginning the next day.  Wearing a good support bra or sports bra minimizes pain and swelling.  You may have sexual intercourse when it is comfortable. You may drive when you no longer are taking prescription pain medication, you can comfortably wear a seatbelt, and you can safely maneuver your car and apply brakes. RETURN TO WORK:  ______________________________________________________________________________________ Dennis Bast should see your doctor in the office for a follow-up appointment approximately two weeks after your surgery.  Your doctor's nurse will typically make your follow-up appointment when she calls you with your pathology report.  Expect your pathology report 2-3 business days after your surgery.  You may call to check if you do not hear from [place] after three days. OTHER INSTRUCTIONS: _______________________________________________________________________________________________ _____________________________________________________________________________________________________________________________________ _____________________________________________________________________________________________________________________________________ _____________________________________________________________________________________________________________________________________  WHEN TO CALL YOUR DOCTOR: Fever over 101.0 Nausea and/or vomiting. Extreme swelling or bruising. Continued bleeding from incision. Increased pain, redness, or drainage from the incision.  The clinic staff is available to answer your questions during regular business hours.  Please don't hesitate to call and ask to speak to one of the nurses for clinical concerns.  If you have a medical emergency, go to the nearest emergency room or call  911.  A surgeon from [facility] is always on call at the hospital.  For further questions, please visit [email or web address]    Post Anesthesia Home Care Instructions  Activity: Get plenty  of rest for the remainder of the day. A responsible individual must stay with you for 24 hours following the procedure.  For the next 24 hours, DO NOT: -Drive a car -Paediatric nurse -Drink alcoholic beverages -Take any medication unless instructed by your physician -Make any legal decisions or sign important papers.  Meals: Start with liquid foods such as gelatin or soup. Progress to regular foods as tolerated. Avoid greasy, spicy, heavy foods. If nausea and/or vomiting occur, drink only clear liquids until the nausea and/or vomiting subsides. Call your physician if vomiting continues.  Special Instructions/Symptoms: Your throat may feel dry or sore from the anesthesia or the breathing tube placed in your throat during surgery. If this causes discomfort, gargle with warm salt water. The discomfort should disappear within 24 hours.  If you had a scopolamine patch placed behind your ear for the management of post- operative nausea and/or vomiting:  1. The medication in the patch is effective for 72 hours, after which it should be removed.  Wrap patch in a tissue and discard in the trash. Wash hands thoroughly with soap and water. 2. You may remove the patch earlier than 72 hours if you experience unpleasant side effects which may include dry mouth, dizziness or visual disturbances. 3. Avoid touching the patch. Wash your hands with soap and water after contact with the patch.    Information for Discharge Teaching: EXPAREL (bupivacaine liposome injectable suspension)   Your surgeon or anesthesiologist gave you EXPAREL(bupivacaine) to help control your pain after surgery.  EXPAREL is a local anesthetic that provides pain relief by numbing the tissue around the surgical site. EXPAREL  is designed to release pain medication over time and can control pain for up to 72 hours. Depending on how you respond to EXPAREL, you may require less pain medication during your recovery.  Possible side effects: Temporary loss of sensation or ability to move in the area where bupivacaine was injected. Nausea, vomiting, constipation Rarely, numbness and tingling in your mouth or lips, lightheadedness, or anxiety may occur. Call your doctor right away if you think you may be experiencing any of these sensations, or if you have other questions regarding possible side effects.  Follow all other discharge instructions given to you by your surgeon or nurse. Eat a healthy diet and drink plenty of water or other fluids.  If you return to the hospital for any reason within 96 hours following the administration of EXPAREL, it is important for health care providers to know that you have received this anesthetic. A teal colored band has been placed on your arm with the date, time and amount of EXPAREL you have received in order to alert and inform your health care providers. Please leave this armband in place for the full 96 hours following administration, and then you may remove the band.

## 2022-03-16 NOTE — Op Note (Signed)
Preoperative diagnosis: Stage II left breast cancer lower inner quadrant  Postoperative diagnosis: Same  Procedure: Left breast seed localized lumpectomy with left axillary sentinel lymph node mapping using mag trace  Surgeon: Erroll Luna, MD  Anesthesia: LMA with pectoral block and 0.25% Marcaine with epinephrine  EBL: Minimal  Drains: None  IV fluids: Per anesthesia record  Indications for procedure: The patient is a 41-year-old female status post neoadjuvant chemotherapy for stage II left breast cancer.  She had next response chemotherapy presents for breast conserving surgery.The procedure has been discussed with the patient. Alternatives to surgery have been discussed with the patient.  Risks of surgery include bleeding,  Infection,  Seroma formation, death,  and the need for further surgery.   The patient understands and wishes to proceed. Sentinel lymph node mapping and dissection has been discussed with the patient.  Risk of bleeding,  Infection,  Seroma formation,  Additional procedures,,  Shoulder weakness , lymphedema shoulder stiffness,  Nerve and blood vessel injury and reaction to the mapping dyes have been discussed.  Alternatives to surgery have been discussed with the patient.  The patient agrees to proceed.    Description of procedure: The patient was met in the holding area and questions were answered.  She had seed placement done as an outpatient.  Left breast was marked as correct site.  She had a pectoral block placed by anesthesia in the left breast.  She was then taken back to the operating room.  She is placed upon upon the OR table.  After induction of general esthesia, a timeout was performed and under sterile conditions 2 cc of mag tracer injected in the left subareolar plexus and massaged for 5 minutes.  We then reprepped and read draped the left breast in a sterile fashion and a second timeout was performed.  Proper patient, site and procedure were verified.  Films  were available for review.  Neoprobe was used to identify the seed in the left lower inner quadrant.  This was near the inferior mammary fold.  Incision was made along the left inframammary fold.  This was medial.  Dissection was carried out but the seed was firmly in the skin above this.  I went ahead and ellipse of the skin with the tumor since this was very close to each other.  We excised the entire area widely.  There is no mass syncope felt.  The Faxitron image revealed the seed and clip to be in this tissue specimen.  We irrigated out the cavity.  Local anesthetic was infiltrated throughout.  Hemostasis achieved with cautery.  Clips were placed to mark the cavity.  I then closed the cavity in layers with a deep layer 3-0 Vicryl.  4 Monocryl was used to close the skin in the subcu cuticular fashion.  Dermabond applied.   We then used the mag trace probe to identify the signal left axilla.  A 4 cm incision was made in the left axilla.  Dissection was carried down with the help of the probe.  In the level 1 basin there were 2 nodes that is taken up the tracer with the spike and the signal.  The background signal approached that of expected baseline.  Once these nodes were removed I reinspected the axilla and had no other peaks of activity.  Irrigation was then used.  Hemostasis achieved with cautery and Arista was used for some mild oozing with good result.  We then closed the axilla with 3-0 Vicryl.  4  Monocryl was used to approximate the skin.  Dermabond applied.  Breast binder placed.  All counts found to be correct.  The patient was then awoke extubated taken to recovery in satisfactory condition.

## 2022-03-16 NOTE — Interval H&P Note (Signed)
History and Physical Interval Note:  03/16/2022 7:29 AM  Cheyenne [name]  has presented today for surgery, with the diagnosis of LEFT BREAST CANCER.  The various methods of treatment have been discussed with the patient and family. After consideration of risks, benefits and other options for treatment, the patient has consented to  Procedure(s) with comments: LEFT BREAST SEED LUMPECTOMY, LEFT SENTINEL LYMPH NODE MAPPING (Left) - GEN & PEC BLOCK as a surgical intervention.  The patient's history has been reviewed, patient examined, no change in status, stable for surgery.  I have reviewed the patient's chart and labs.  Questions were answered to the patient's satisfaction.     [facility]

## 2022-03-16 NOTE — Anesthesia Postprocedure Evaluation (Signed)
Anesthesia Post Note  Patient: Cheyenne [name]  Procedure(s) Performed: LEFT BREAST SEED LUMPECTOMY, LEFT SENTINEL LYMPH NODE MAPPING (Left: Breast)     Patient location during evaluation: PACU Anesthesia Type: General Level of consciousness: awake and alert and oriented Pain management: pain level controlled Vital Signs Assessment: post-procedure vital signs reviewed and stable Respiratory status: spontaneous breathing, nonlabored ventilation and respiratory function stable Cardiovascular status: blood pressure returned to baseline and stable Postop Assessment: no apparent nausea or vomiting Anesthetic complications: no   No notable events documented.  Last Vitals:  Vitals:   03/16/22 0930 03/16/22 0945  BP: (!) 86/50 (!) 82/52  Pulse: 66 72  Resp: 16 13  Temp:    SpO2: 100% 98%    Last Pain:  Vitals:   03/16/22 0945  TempSrc:   PainSc: 0-No pain                 Jvion Turgeon A.

## 2022-03-16 NOTE — Progress Notes (Signed)
Assisted Dr. Foster with left, pectoralis, ultrasound guided block. Side rails up, monitors on throughout procedure. See vital signs in flow sheet. Tolerated Procedure well. 

## 2022-03-17 ENCOUNTER — Encounter (HOSPITAL_BASED_OUTPATIENT_CLINIC_OR_DEPARTMENT_OTHER): Payer: Self-pay | Admitting: Surgery

## 2022-03-17 LAB — SURGICAL PATHOLOGY

## 2022-03-22 ENCOUNTER — Encounter: Payer: Self-pay | Admitting: Surgery

## 2022-03-25 ENCOUNTER — Inpatient Hospital Stay: Payer: Federal, State, Local not specified - PPO

## 2022-03-25 ENCOUNTER — Encounter: Payer: Self-pay | Admitting: *Deleted

## 2022-03-25 ENCOUNTER — Inpatient Hospital Stay (HOSPITAL_BASED_OUTPATIENT_CLINIC_OR_DEPARTMENT_OTHER): Payer: Federal, State, Local not specified - PPO | Admitting: Hematology

## 2022-03-25 ENCOUNTER — Inpatient Hospital Stay: Payer: Federal, State, Local not specified - PPO | Attending: Hematology

## 2022-03-25 ENCOUNTER — Other Ambulatory Visit: Payer: Self-pay

## 2022-03-25 ENCOUNTER — Encounter: Payer: Self-pay | Admitting: Hematology

## 2022-03-25 VITALS — BP 108/75 | HR 85 | Temp 98.4°F | Resp 18

## 2022-03-25 DIAGNOSIS — C50312 Malignant neoplasm of lower-inner quadrant of left female breast: Secondary | ICD-10-CM

## 2022-03-25 DIAGNOSIS — Z5112 Encounter for antineoplastic immunotherapy: Secondary | ICD-10-CM | POA: Insufficient documentation

## 2022-03-25 DIAGNOSIS — Z79899 Other long term (current) drug therapy: Secondary | ICD-10-CM | POA: Insufficient documentation

## 2022-03-25 DIAGNOSIS — Z170 Estrogen receptor positive status [ER+]: Secondary | ICD-10-CM

## 2022-03-25 DIAGNOSIS — Z95828 Presence of other vascular implants and grafts: Secondary | ICD-10-CM

## 2022-03-25 LAB — CBC WITH DIFFERENTIAL (CANCER CENTER ONLY)
Abs Immature Granulocytes: 0.02 10*3/uL (ref 0.00–0.07)
Basophils Absolute: 0.1 10*3/uL (ref 0.0–0.1)
Basophils Relative: 1 %
Eosinophils Absolute: 0.3 10*3/uL (ref 0.0–0.5)
Eosinophils Relative: 4 %
HCT: 29.9 % — ABNORMAL LOW (ref 36.0–46.0)
Hemoglobin: 10.3 g/dL — ABNORMAL LOW (ref 12.0–15.0)
Immature Granulocytes: 0 %
Lymphocytes Relative: 18 %
Lymphs Abs: 1.2 10*3/uL (ref 0.7–4.0)
MCH: 33 pg (ref 26.0–34.0)
MCHC: 34.4 g/dL (ref 30.0–36.0)
MCV: 95.8 fL (ref 80.0–100.0)
Monocytes Absolute: 0.7 10*3/uL (ref 0.1–1.0)
Monocytes Relative: 10 %
Neutro Abs: 4.6 10*3/uL (ref 1.7–7.7)
Neutrophils Relative %: 67 %
Platelet Count: 286 10*3/uL (ref 150–400)
RBC: 3.12 MIL/uL — ABNORMAL LOW (ref 3.87–5.11)
RDW: 13.7 % (ref 11.5–15.5)
WBC Count: 6.8 10*3/uL (ref 4.0–10.5)
nRBC: 0 % (ref 0.0–0.2)

## 2022-03-25 LAB — T4, FREE: Free T4: 0.9 ng/dL (ref 0.61–1.12)

## 2022-03-25 LAB — CMP (CANCER CENTER ONLY)
ALT: 25 U/L (ref 0–44)
AST: 21 U/L (ref 15–41)
Albumin: 4.3 g/dL (ref 3.5–5.0)
Alkaline Phosphatase: 92 U/L (ref 38–126)
Anion gap: 6 (ref 5–15)
BUN: 18 mg/dL (ref 6–20)
CO2: 28 mmol/L (ref 22–32)
Calcium: 9.8 mg/dL (ref 8.9–10.3)
Chloride: 103 mmol/L (ref 98–111)
Creatinine: 0.68 mg/dL (ref 0.44–1.00)
GFR, Estimated: >60 mL/min (ref >60)
Glucose, Bld: 111 mg/dL — ABNORMAL HIGH (ref 70–99)
Potassium: 3.6 mmol/L (ref 3.5–5.1)
Sodium: 137 mmol/L (ref 135–145)
Total Bilirubin: 0.4 mg/dL (ref 0.3–1.2)
Total Protein: 7.5 g/dL (ref 6.5–8.1)

## 2022-03-25 LAB — TSH: TSH: 1.727 u[IU]/mL (ref 0.350–4.500)

## 2022-03-25 MED ORDER — SODIUM CHLORIDE 0.9% FLUSH
10.0000 mL | Freq: Once | INTRAVENOUS | Status: AC
  Administered 2022-03-25: 10 mL

## 2022-03-25 MED ORDER — SODIUM CHLORIDE 0.9% FLUSH
10.0000 mL | INTRAVENOUS | Status: DC | PRN
  Administered 2022-03-25: 10 mL

## 2022-03-25 MED ORDER — HEPARIN SOD (PORK) LOCK FLUSH 100 UNIT/ML IV SOLN
500.0000 [IU] | Freq: Once | INTRAVENOUS | Status: AC | PRN
  Administered 2022-03-25: 500 [IU]

## 2022-03-25 MED ORDER — LIDOCAINE-PRILOCAINE 2.5-2.5 % EX CREA: TOPICAL_CREAM | CUTANEOUS | 3 refills | Status: DC

## 2022-03-25 MED ORDER — SODIUM CHLORIDE 0.9 % IV SOLN
200.0000 mg | Freq: Once | INTRAVENOUS | Status: AC
  Administered 2022-03-25: 200 mg via INTRAVENOUS
  Filled 2022-03-25: qty 200

## 2022-03-25 MED ORDER — SODIUM CHLORIDE 0.9 % IV SOLN: Freq: Once | INTRAVENOUS | Status: AC

## 2022-03-25 NOTE — Patient Instructions (Addendum)
[facility] MEDICAL [facility]COLOGY   Discharge Instructions: Thank you for choosing [facility] to provide your oncology and hematology care.   If you have a lab appointment with the Cancer Center, please go directly to the Cancer Center and check in at the registration area.   Wear comfortable clothing and clothing appropriate for easy access to any Portacath or PICC line.   We strive to give you quality time with your provider. You may need to reschedule your appointment if you arrive late (15 or more minutes).  Arriving late affects you and other patients whose appointments are after yours.  Also, if you miss three or more appointments without notifying the office, you may be dismissed from the clinic at the provider's discretion.      For prescription refill requests, have your pharmacy contact our office and allow 72 hours for refills to be completed.    Today you received the following chemotherapy and/or immunotherapy agents: Pembrolizumab (Keytruda)      To help prevent nausea and vomiting after your treatment, we encourage you to take your nausea medication as directed.  BELOW ARE SYMPTOMS THAT SHOULD BE REPORTED IMMEDIATELY: *FEVER GREATER THAN 100.4 F (38 C) OR HIGHER *CHILLS OR SWEATING *NAUSEA AND VOMITING THAT IS NOT CONTROLLED WITH YOUR NAUSEA MEDICATION *UNUSUAL SHORTNESS OF BREATH *UNUSUAL BRUISING OR BLEEDING *URINARY PROBLEMS (pain or burning when urinating, or frequent urination) *BOWEL PROBLEMS (unusual diarrhea, constipation, pain near the anus) TENDERNESS IN MOUTH AND THROAT WITH OR WITHOUT PRESENCE OF ULCERS (sore throat, sores in mouth, or a toothache) UNUSUAL RASH, SWELLING OR PAIN  UNUSUAL VAGINAL DISCHARGE OR ITCHING   Items with * indicate a potential emergency and should be followed up as soon as possible or go to the Emergency Department if any problems should occur.  Please show the CHEMOTHERAPY ALERT CARD or IMMUNOTHERAPY ALERT  CARD at check-in to the Emergency Department and triage nurse.  Should you have questions after your visit or need to cancel or reschedule your appointment, please contact [facility]  Dept: [phone]  and follow the prompts.  Office hours are 8:00 a.m. to 4:30 p.m. Monday - Friday. Please note that voicemails left after 4:00 p.m. may not be returned until the following business day.  We are closed weekends and major holidays. You have access to a nurse at all times for urgent questions. Please call the main number to the clinic Dept: [phone] and follow the prompts.   For any non-urgent questions, you may also contact your provider using MyChart. We now offer e-Visits for anyone 18 and older to request care online for non-urgent symptoms. For details visit [email or web address].   Also download the MyChart app! Go to the app store, search "MyChart", open the app, select [facility], and log in with your MyChart username and password.  Masks are optional in the cancer centers. If you would like for your care team to wear a mask while they are taking care of you, please let them know. You may have one support person who is at least 40 years old accompany you for your appointments. 

## 2022-03-25 NOTE — Progress Notes (Signed)
[place]   [phone] Fax:([phone]) [phone]   Clinic Follow up Note   Patient Care Team: Billie Ruddy, MD as PCP - General (Family Medicine) Erroll Luna, MD as Consulting Physician (General Surgery) Truitt Merle, MD as Consulting Physician (Hematology) Gery Pray, MD as Consulting Physician (Radiation Oncology)  Date of Service:  03/25/2022  CHIEF COMPLAINT: f/u of left breast cancer  CURRENT THERAPY:  [name], starting 09/09/21  ASSESSMENT & PLAN:  Cheyenne [name] is a 41 y.o. pre-menopausal female with   1. Malignant neoplasm of lower-inner quadrant of left breast, Stage IIIA, c(T2, N1), Functionally triple negative, Grade 3 -presented with palpable left breast mass. B/l MM and left [place] on 08/07/21 showed: 2.8 cm left breast mass at 7:30; at least 4 abnormal lymph nodes. Biopsy that day confirmed IDC in breast but node was negative. -breast MRI on 08/26/21 and PET on 08/31/21 were negative aside from primary mass.  -port placed 09/08/21 -s/p neoadjuvant carbo/taxol and keytruda on 09/09/21 - 11/25/21; her breast mass became no longer palpable after cycle 3. She then completed Epirubicin/cytoxan 12/02/21 - 02/03/22 (she had allergic reaction to Adriamycin), tolerated moderately well overall. -left lumpectomy 03/16/22 with Dr. Brantley Stage showed no residual carcinoma, indicating complete response.I reviewed with her  -she will continue Keytruda today and every 3 weeks to complete a year. Labs reviewed, hgb 10.3, overall stable and adequate to resume Keytruda today. -No additional adjuvant chemo is planned given her pCR -will refer her to rad/onc for adjuvant radiation when she recovers well from surgery.   2. Anemia -secondary to chemo; she has required blood transfusion several times throughout treatment -hgb 10.3 today (03/25/22)    PLAN:  -proceed with Keytruda today -I refilled emla cream -lab, flush, f/u, and Keytruda every 3 weeks -rad/onc  referral    No problem-specific Assessment & Plan notes found for this encounter.   SUMMARY OF ONCOLOGIC HISTORY: Oncology History Overview Note   Cancer Staging  Malignant neoplasm of lower-inner quadrant of left breast in female, estrogen receptor positive ([place]) Staging form: Breast, AJCC 8th Edition - Clinical stage from 08/07/2021: Stage IIIA (cT2, cN1, cM0, G3, ER+, PR-, HER2-) - Signed by Truitt Merle, MD on 08/18/2021    Malignant neoplasm of lower-inner quadrant of left breast in female, estrogen receptor positive ([place])  08/07/2021 Cancer Staging   Staging form: Breast, AJCC 8th Edition - Clinical stage from 08/07/2021: Stage IIB (cT2, cN0, cM0, G3, ER-, PR-, HER2-) - Signed by Truitt Merle, MD on 09/24/2021 Stage prefix: Initial diagnosis Histologic grading system: 3 grade system   08/07/2021 Mammogram   CLINICAL DATA:  Palpable mass in the LEFT breast since August.   EXAM: DIGITAL DIAGNOSTIC BILATERAL MAMMOGRAM WITH TOMOSYNTHESIS AND CAD; ULTRASOUND LEFT BREAST LIMITED  IMPRESSION: 1. Suspicious mass in the 7:30 o'clock location of the LEFT breast for which biopsy is recommended. 2. LEFT axillary adenopathy, with at least 4 abnormal lymph nodes.   08/07/2021 Initial Biopsy   Diagnosis 1. Breast, left, needle core biopsy, 7:30, ribbon clip - INVASIVE DUCTAL CARCINOMA - SEE COMMENT 2. Lymph node, needle/core biopsy, left axilla, coil clip - NO CARCINOMA IDENTIFIED Microscopic Comment 1. based on the biopsy, the carcinoma appears Nottingham grade 3 of 3 and measures 1.1 cm in greatest linear extent.  1. PROGNOSTIC INDICATORS Results: The tumor cells are EQUIVOCAL for Her2 (2+). Her2 by FISH will be performed and results reported separately. Estrogen Receptor: 2%, POSITIVE, WEAK STAINING INTENSITY Progesterone Receptor: <1%, NEGATIVE Proliferation  Marker Ki67: 40%  1. FLUORESCENCE IN-SITU HYBRIDIZATION Results: GROUP 5: HER2 **NEGATIVE**   08/17/2021 Initial Diagnosis    Malignant neoplasm of lower-inner quadrant of left breast in female, estrogen receptor positive ([place])   08/19/2021 Genetic Testing   [facility] CancerNext-Expanded Panel is Negative. Report date is 09/02/2021.  The CancerNext-Expanded gene panel offered by [facility] and includes sequencing, rearrangement, and RNA analysis for the following 77 genes: AIP, ALK, APC, ATM, AXIN2, BAP1, BARD1, BLM, BMPR1A, BRCA1, BRCA2, BRIP1, CDC73, CDH1, CDK4, CDKN1B, CDKN2A, CHEK2, CTNNA1, DICER1, FANCC, FH, FLCN, GALNT12, KIF1B, LZTR1, MAX, MEN1, MET, MLH1, MSH2, MSH3, MSH6, MUTYH, NBN, NF1, NF2, NTHL1, PALB2, PHOX2B, PMS2, POT1, PRKAR1A, PTCH1, PTEN, RAD51C, RAD51D, RB1, RECQL, RET, SDHA, SDHAF2, SDHB, SDHC, SDHD, SMAD4, SMARCA4, SMARCB1, SMARCE1, STK11, SUFU, TMEM127, TP53, TSC1, TSC2, VHL and XRCC2 (sequencing and deletion/duplication); EGFR, EGLN1, HOXB13, KIT, MITF, PDGFRA, POLD1, and POLE (sequencing only); EPCAM and GREM1 (deletion/duplication only).    08/26/2021 Imaging   EXAM: BILATERAL BREAST MRI WITH AND WITHOUT CONTRAST  IMPRESSION: 2.3 cm mass in the lower-inner quadrant of the left breast corresponding with the biopsy proven invasive ductal carcinoma.   08/31/2021 PET scan   IMPRESSION: 1. Known mass medially in the left breast is markedly hypermetabolic consistent with breast cancer. 2. No evidence of nodal metastases within the chest. No typical distant metastases. 3. Suspected acute inflammatory process involving central mesentery and pelvis with ill-defined focal hypermetabolic activity anterior to the sacrum. There are pelvic inflammatory changes and free pelvic fluid. Recent labs demonstrate mild leukocytosis. Differential considerations include acute appendicitis, enteritis and small perforated foreign body. No evidence of drainable fluid collection, pneumoperitoneum or bowel obstruction. 4. These results were called by telephone at the time of interpretation on 08/31/2021 at 5:12 pm to on-call  oncology nurse, [name], who verbally acknowledged these results. Unless there is a good clinical explanation for these findings, evaluation in the emergency department is likely warranted, and patient may benefit from abdominopelvic CT with oral and intravenous contrast.   09/01/2021 Imaging   EXAM: CT ABDOMEN AND PELVIS WITH CONTRAST  IMPRESSION: Multiple thick-walled loops of distal small bowel, suggesting infectious/inflammatory enteritis. Associated small volume pelvic ascites. No pneumatosis or free air.   No evidence of bowel obstruction.  Normal appendix.   2.1 cm lesion in the medial left breast, likely corresponding to the patient's known primary breast neoplasm. No findings suspicious for metastatic disease.   09/09/2021 - 03/25/2022 Chemotherapy   Patient is on Treatment Plan : BREAST Pembrolizumab (200) D1 + Carboplatin (5) D1 + Paclitaxel (80) D1,8,15 q21d X 4 cycles / Pembrolizumab (200) D1 + AC D1 q21d x 4 cycles     09/09/2021 -  Chemotherapy   Patient is on Treatment Plan : BREAST Pembrolizumab (200) D1 + Carboplatin (1.5) D1,8,15 + Paclitaxel (80) D1,8,15 q21d X 4 cycles / Pembrolizumab (200) D1 + AC D1 q21d x 4 cycles     03/16/2022 Definitive Surgery   FINAL MICROSCOPIC DIAGNOSIS:   A. LEFT BREAST, LUMPECTOMY:  Negative for residual carcinoma (ypT0)  Changes consistent with neoadjuvant therapy  Changes consistent with prior biopsy   B. LEFT AXILLARY SENTINEL LYMPH NODE, EXCISION:  One benign lymph node, negative for carcinoma (0/1)   C. LEFT AXILLARY SENTINEL LYMPH NODE, EXCISION:  One benign lymph node, negative for carcinoma (0/1)       INTERVAL HISTORY:  Cheyenne [name] is here for a follow up of breast cancer. She was last seen by me on 02/25/22. She presents  to the clinic accompanied by her wife. She reports she did well with surgery but notes a rash below her left breast. She also reports she developed a single mouth sore to the roof of her mouth.    All other systems were reviewed with the patient and are negative.  MEDICAL HISTORY:  Past Medical History:  Diagnosis Date   Anal fissure    Anxiety    Breast cancer ([place]) 08/10/21   Date diagnosis was given   DVT (deep venous thrombosis) ([place])    Incomplete right bundle branch block (RBBB) determined by electrocardiography 09/01/2021    SURGICAL HISTORY: Past Surgical History:  Procedure Laterality Date   BREAST LUMPECTOMY WITH RADIOACTIVE SEED AND SENTINEL LYMPH NODE BIOPSY Left 03/16/2022   Procedure: LEFT BREAST SEED LUMPECTOMY, LEFT SENTINEL [place];  Surgeon: Erroll Luna, MD;  Location: [facility];  Service: General;  Laterality: Left;  GEN & PEC BLOCK   NO PAST SURGERIES     PORTACATH PLACEMENT N/A 09/08/2021   Procedure: INSERTION PORT-A-CATH;  Surgeon: Erroll Luna, MD;  Location: [facility];  Service: General;  Laterality: N/A;    I have reviewed the social history and family history with the patient and they are unchanged from previous note.  ALLERGIES:  has No Known Allergies.  MEDICATIONS:  Current Outpatient Medications  Medication Sig Dispense Refill   acetaminophen (TYLENOL) 500 MG tablet Take 500-1,000 mg by mouth every 6 (six) hours as needed (pain.).     albuterol (VENTOLIN HFA) 108 (90 Base) MCG/ACT inhaler Inhale 2 puffs into the lungs every 4 (four) hours as needed for wheezing. 1 each 5   ALPRAZolam (XANAX) 0.5 MG tablet Take one tablet twice daily as needed for anxiety. 45 tablet 2   cyclobenzaprine (FLEXERIL) 10 MG tablet Take 10 mg by mouth daily as needed for muscle spasms.     desonide (DESOWEN) 0.05 % cream Apply topically 2 (two) times daily. (Patient taking differently: Apply 1 application  topically 2 (two) times daily as needed (skin irritation.).) 30 g 1   desonide (DESOWEN) 0.05 % lotion Apply 1 application. topically daily as needed (acne).     EPINEPHrine 0.3 mg/0.3 mL IJ SOAJ injection Inject 0.3 mg into the muscle  as needed for anaphylaxis. 1 each 0   hydroquinone 4 % cream APPLY TO DARK SPOTS AT BEDTIME (Patient taking differently: Apply 1 application  topically daily as needed (dark spots). APPLY TO DARK SPOTS AT BEDTIME) 28.35 g 0   ibuprofen (ADVIL) 800 MG tablet Take 1 tablet (800 mg total) by mouth every 8 (eight) hours as needed. 30 tablet 0   ibuprofen (ADVIL) 800 MG tablet Take 1 tablet (800 mg total) by mouth every 8 (eight) hours as needed. 30 tablet 0   KLOR-CON M20 20 MEQ tablet TAKE 1 TABLET BY MOUTH TWICE A DAY 60 tablet 0   loratadine (CLARITIN) 10 MG tablet Take 10 mg by mouth daily.     magic mouthwash (nystatin, diphenhydrAMINE, alum & mag hydroxide) suspension mixture Swish and swallow 5 mLs by mouth 3 (three) times daily as needed for mouth pain. 140 mL 1   Multiple Vitamins-Minerals (ONE A DAY IMMUNITY DEFENSE) CHEW Chew 1 tablet by mouth daily.     oxyCODONE (OXY IR/ROXICODONE) 5 MG immediate release tablet Take 1 tablet (5 mg total) by mouth every 6 (six) hours as needed for severe pain. 15 tablet 0   traMADol (ULTRAM) 50 MG tablet Take 1 tablet (50 mg  total) by mouth every 6 (six) hours as needed. (Patient taking differently: Take 50 mg by mouth every 6 (six) hours as needed for moderate pain.) 5 tablet 0   No current facility-administered medications for this visit.    PHYSICAL EXAMINATION: ECOG PERFORMANCE STATUS: 1 - Symptomatic but completely ambulatory  There were no vitals filed for this visit. Wt Readings from Last 3 Encounters:  03/16/22 184 lb 1.4 oz (83.5 kg)  02/25/22 184 lb 3.2 oz (83.6 kg)  02/03/22 189 lb 12.8 oz (86.1 kg)     GENERAL:alert, no distress and comfortable SKIN: skin color, texture, turgor are normal, no significant lesions, (+) incision underneath left breast and left axilla are healing well, no discharge or skin erythema. EYES: normal, Conjunctiva are pink and non-injected, sclera clear OROPHARYNX:no exudate, no erythema and lips, buccal mucosa,  and tongue normal, (+) mouth sore to roof NEURO: alert & oriented x 3 with fluent speech, no focal motor/sensory deficits  LABORATORY DATA:  I have reviewed the data as listed    Latest Ref Rng & Units 03/25/2022    9:55 AM 02/25/2022    9:56 AM 02/12/2022    8:52 AM  CBC  WBC 4.0 - 10.5 K/uL 6.8  4.5  1.0   Hemoglobin 12.0 - 15.0 g/dL 10.3  9.0  9.1   Hematocrit 36.0 - 46.0 % 29.9  26.0  26.8   Platelets 150 - 400 K/uL 286  275  90         Latest Ref Rng & Units 03/25/2022    9:55 AM 02/25/2022    9:56 AM 02/12/2022    8:52 AM  CMP  Glucose 70 - 99 mg/dL 111  121  103   BUN 6 - 20 mg/dL 18  11  16    Creatinine 0.44 - 1.00 mg/dL 0.68  0.85  0.91   Sodium 135 - 145 mmol/L 137  140  140   Potassium 3.5 - 5.1 mmol/L 3.6  3.6  3.8   Chloride 98 - 111 mmol/L 103  105  105   CO2 22 - 32 mmol/L 28  27  27    Calcium 8.9 - 10.3 mg/dL 9.8  9.5  9.9   Total Protein 6.5 - 8.1 g/dL 7.5  7.6  7.8   Total Bilirubin 0.3 - 1.2 mg/dL 0.4  0.3  0.6   Alkaline Phos 38 - 126 U/L 92  93  100   AST 15 - 41 U/L 21  38  18   ALT 0 - 44 U/L 25  47  17       RADIOGRAPHIC STUDIES: I have personally reviewed the radiological images as listed and agreed with the findings in the report. No results found.    Orders Placed This Encounter  Procedures   CBC with Differential ([place] Only)    Standing Status:   Future    Standing Expiration Date:   04/16/2023   CMP ([place] only)    Standing Status:   Future    Standing Expiration Date:   04/16/2023   CBC with Differential ([place] Only)    Standing Status:   Future    Standing Expiration Date:   05/07/2023   CMP ([place] only)    Standing Status:   Future    Standing Expiration Date:   05/07/2023   All questions were answered. The patient knows to call the clinic with any problems, questions or concerns. No barriers to learning was detected.  The total time spent in the appointment was 30 minutes.     Truitt Merle,  MD 03/25/2022   I, Wilburn Mylar, am acting as scribe for Truitt Merle, MD.   I have reviewed the above documentation for accuracy and completeness, and I agree with the above.

## 2022-03-26 ENCOUNTER — Other Ambulatory Visit: Payer: Self-pay

## 2022-03-26 ENCOUNTER — Other Ambulatory Visit: Payer: Self-pay | Admitting: Hematology

## 2022-03-26 ENCOUNTER — Encounter: Payer: Self-pay | Admitting: Hematology

## 2022-03-26 ENCOUNTER — Telehealth: Payer: Self-pay | Admitting: Radiation Oncology

## 2022-03-26 LAB — CANCER ANTIGEN 27.29: CA 27.29: 20.8 U/mL (ref 0.0–38.6)

## 2022-03-26 NOTE — Telephone Encounter (Signed)
LVM to sched CON with Dr. Kinard 

## 2022-03-30 ENCOUNTER — Encounter: Payer: Self-pay | Admitting: Hematology

## 2022-03-30 ENCOUNTER — Other Ambulatory Visit: Payer: Self-pay

## 2022-04-01 ENCOUNTER — Encounter: Payer: Self-pay | Admitting: *Deleted

## 2022-04-02 ENCOUNTER — Other Ambulatory Visit: Payer: Self-pay

## 2022-04-05 ENCOUNTER — Encounter: Payer: Self-pay | Admitting: Family Medicine

## 2022-04-05 ENCOUNTER — Other Ambulatory Visit: Payer: Self-pay | Admitting: Family Medicine

## 2022-04-05 DIAGNOSIS — L819 Disorder of pigmentation, unspecified: Secondary | ICD-10-CM

## 2022-04-06 ENCOUNTER — Other Ambulatory Visit: Payer: Self-pay | Admitting: Hematology

## 2022-04-06 MED ORDER — HYDROQUINONE 4 % EX CREA: TOPICAL_CREAM | CUTANEOUS | 0 refills | Status: AC

## 2022-04-07 ENCOUNTER — Encounter (HOSPITAL_COMMUNITY): Payer: Self-pay

## 2022-04-08 ENCOUNTER — Telehealth: Payer: Self-pay | Admitting: Hematology

## 2022-04-08 ENCOUNTER — Encounter: Payer: Self-pay | Admitting: *Deleted

## 2022-04-08 NOTE — Telephone Encounter (Signed)
Scheduled follow-up appointments per appointment request workqueue. Patient is aware. 

## 2022-04-08 NOTE — Progress Notes (Signed)
Location of Breast Cancer: lower-inner quadrant of left breast  Histology per Pathology Report:   03/16/2022 FINAL MICROSCOPIC DIAGNOSIS:   A. LEFT BREAST, LUMPECTOMY:  Negative for residual carcinoma (ypT0)  Changes consistent with neoadjuvant therapy  Changes consistent with prior biopsy   B. LEFT AXILLARY SENTINEL LYMPH NODE, EXCISION:  One benign lymph node, negative for carcinoma (0/1)   C. LEFT AXILLARY SENTINEL LYMPH NODE, EXCISION:  One benign lymph node, negative for carcinoma (0/1)   08/07/2021 Diagnosis 1. Breast, left, needle core biopsy, 7:30, ribbon clip - INVASIVE DUCTAL CARCINOMA - SEE COMMENT 2. Lymph node, needle/core biopsy, left axilla, coil clip - NO CARCINOMA IDENTIFIED  Receptor Status: ER(2%), PR (<1%), Her2-neu (negative), Ki-(40%)  Did patient present with symptoms (if so, please note symptoms) or was this found on screening mammography?: palpated a lump  Past/Anticipated interventions by surgeon, if [id number] breast seed localized lumpectomy with left axillary sentinel lymph node mapping using mag trace 03/16/2022 by Dr. Brantley Stage   Past/Anticipated interventions by medical oncology, if any: s/p neoadjuvant carbo/taxol and keytruda on 09/09/21 - 11/25/21; Epirubicin/cytoxan 12/02/21 - 02/03/22 (she had allergic reaction to Adriamycin), currently getting Keytruda, q3weeks, starting 09/09/21.  Lymphedema issues, if any:  no    Pain issues, if any:  2 out of 10 in her left breast  SAFETY ISSUES: Prior radiation? no Pacemaker/ICD? no Possible current pregnancy?no Is the patient on methotrexate? no  Current Complaints / other details:  Patient is here with her wife.  BP (!) 107/58 (BP Location: Right Arm, Patient Position: Sitting)   Pulse 87   Temp 98.6 F (37 C) (Oral)   Resp 18   Ht 5' 2"  (1.575 m)   Wt 182 lb 12.8 oz (82.9 kg)   SpO2 100%   BMI 33.43 kg/m      Jacqulyn Liner, RN 04/08/2022,2:22 PM

## 2022-04-09 ENCOUNTER — Other Ambulatory Visit: Payer: Self-pay

## 2022-04-13 NOTE — Progress Notes (Signed)
Radiation Oncology         [phone]) [phone] ________________________________  Name: Cheyenne [name] MRN: [id number]  Date: 04/14/2022  DOB: 1981/06/05  Re-Evaluation Note  CC: Billie Ruddy, MD  Truitt Merle, MD  No diagnosis found.  Diagnosis:  No residual carcinoma s/p neoadjuvant chemotherapy and left breast conserving surgery  Stage IIIA (cT2, cN1, cM0) Left Breast LIQ, Invasive Ductal Carcinoma, ER+ / PR- / Her2-, Grade 3, Functionally triple negative, Grade 3   Cancer Staging  Malignant neoplasm of lower-inner quadrant of left breast in female, estrogen receptor positive ([place]) Staging form: Breast, AJCC 8th Edition - Clinical stage from 08/07/2021: Stage IIB (cT2, cN0, cM0, G3, ER-, PR-, HER2-) - Signed by Truitt Merle, MD on 09/24/2021  Narrative:  The patient returns today to discuss radiation treatment options. She was seen in the multidisciplinary breast clinic on 08/19/21.   Since consultation, she underwent genetic testing on 08/19/21. Results showed no clinically significant variants detected by BRCAplus or +RNAinsight testing.  She has been treated with neoadjuvant chemotherapy consisting of carbo/taxol and keytruda from 09/09/21 through 11/25/21 under Dr. Burr Medico. Her breast mass was no longer palpable following cycle 3. Chemo toxicities reported by the patient during the course of systemic treatment included anemia, for which she required blood transfusions. She then underwent further systemic treatment consisting of Epirubicin/cytoxan from 12/02/21 through 02/03/22. She had allergic reaction to Adriamycin, but otherwise tolerated her second line treatment relatively well.   She opted to proceed with left breast lumpectomy with nodal biopsies on 03/16/22 under the care of Dr. Brantley Stage. Pathology from the procedure revealed no residual carcinoma s/p neoadjuvant chemotherapy. Nodal status of 2/2 left axillary SLN excisions negative for carcinoma.    Post-operatively, the patient was  noted to have a seroma with drainage from the inframammary incision as well as a (presumed) additional seroma inferior to the axillary incision. Given no signs of infection on examination, she was instructed to keep her incision covered with gauze to collect any excess drainage.    Following lumpectomy, the patient was able to resume [place] on 03/25/22 per Dr. Burr Medico. (Last hemoglobin check during her visit with Dr. Burr Medico on 03/25/22 was 10.3).  Pertinent imaging performed in the interval includes the following:  -- Bilateral breast MRI on 08/26/21 which redemonstrated the 2.3 cm mass in the lower-inner quadrant of the left breast corresponding with the biopsy proven invasive ductal carcinoma. NO abnormal appearing lymph nodes were appreciated.  -- PET scan on 08/31/21 showed: the known mass in the medial left breast as markedly hypermetabolic (consistent with breast cancer). Incidentally, a suspected acute inflammatory process involving central mesentery and pelvis was appreciated, with ill-defined focal hypermetabolic activity anterior to the sacrum. Otherwise, PET showed no evidence of nodal metastases within the chest or distant metastases.  -- CT of the abdomen and pelvis on 09/01/21 (for further evaluation of pelvic finding appreciated on PET) showed multiple thick-walled loops of distal small bowel, suggestive of infectious/inflammatory enteritis. Associated small volume pelvic ascites was also appreciated. CT also redemonstrated no evidence of metastatic disease in the abdomen or or pelvis. -- Bilateral breast MRI on 02/05/22 showed near complete resolution of known LIQ left breast cancer, with only minimal residual blush enhancement appreciated at the biopsy site. No evidence of right breast malignancy was appreciated.  On review of systems, the patient reports ***. She denies *** and any other symptoms.   Allergies:  has No Known Allergies.  Meds: Current Outpatient Medications  Medication  Sig Dispense Refill   acetaminophen (TYLENOL) 500 MG tablet Take 500-1,000 mg by mouth every 6 (six) hours as needed (pain.).     albuterol (VENTOLIN HFA) 108 (90 Base) MCG/ACT inhaler Inhale 2 puffs into the lungs every 4 (four) hours as needed for wheezing. 1 each 5   ALPRAZolam (XANAX) 0.5 MG tablet Take one tablet twice daily as needed for anxiety. 45 tablet 2   cyclobenzaprine (FLEXERIL) 10 MG tablet Take 10 mg by mouth daily as needed for muscle spasms.     desonide (DESOWEN) 0.05 % cream Apply topically 2 (two) times daily. (Patient taking differently: Apply 1 application  topically 2 (two) times daily as needed (skin irritation.).) 30 g 1   desonide (DESOWEN) 0.05 % lotion Apply 1 application. topically daily as needed (acne).     EPINEPHrine 0.3 mg/0.3 mL IJ SOAJ injection Inject 0.3 mg into the muscle as needed for anaphylaxis. 1 each 0   hydroquinone 4 % cream APPLY TO DARK SPOTS AT BEDTIME 28.35 g 0   ibuprofen (ADVIL) 800 MG tablet Take 1 tablet (800 mg total) by mouth every 8 (eight) hours as needed. 30 tablet 0   ibuprofen (ADVIL) 800 MG tablet Take 1 tablet (800 mg total) by mouth every 8 (eight) hours as needed. 30 tablet 0   KLOR-CON M20 20 MEQ tablet TAKE 1 TABLET BY MOUTH TWICE A DAY 60 tablet 0   loratadine (CLARITIN) 10 MG tablet Take 10 mg by mouth daily.     magic mouthwash (nystatin, diphenhydrAMINE, alum & mag hydroxide) suspension mixture Swish and swallow 5 mLs by mouth 3 (three) times daily as needed for mouth pain. 140 mL 1   Multiple Vitamins-Minerals (ONE A DAY IMMUNITY DEFENSE) CHEW Chew 1 tablet by mouth daily.     oxyCODONE (OXY IR/ROXICODONE) 5 MG immediate release tablet Take 1 tablet (5 mg total) by mouth every 6 (six) hours as needed for severe pain. 15 tablet 0   traMADol (ULTRAM) 50 MG tablet Take 1 tablet (50 mg total) by mouth every 6 (six) hours as needed. (Patient taking differently: Take 50 mg by mouth every 6 (six) hours as needed for moderate pain.) 5  tablet 0   No current facility-administered medications for this encounter.    Physical Findings: The patient is in no acute distress. Patient is alert and oriented.  vitals were not taken for this visit.  No significant changes. Lungs are clear to auscultation bilaterally. Heart has regular rate and rhythm. No palpable cervical, supraclavicular, or axillary adenopathy. Abdomen soft, non-tender, normal bowel sounds. *** Breast: no palpable mass, nipple discharge or bleeding. *** Breast: ***  Lab Findings: Lab Results  Component Value Date   WBC 6.8 03/25/2022   HGB 10.3 (L) 03/25/2022   HCT 29.9 (L) 03/25/2022   MCV 95.8 03/25/2022   PLT 286 03/25/2022    Radiographic Findings: MM Breast Surgical Specimen  Result Date: 03/16/2022 CLINICAL DATA:  Evaluate specimen EXAM: SPECIMEN RADIOGRAPH OF THE LEFT BREAST COMPARISON:  Previous exam(s). FINDINGS: Status post excision of the left breast. The radioactive seed and biopsy marker clip are present, completely intact, and were marked for pathology. IMPRESSION: Specimen radiograph of the left breast. Electronically Signed   By: Dorise Bullion III M.D.   On: 03/16/2022 08:20  MM LT RADIOACTIVE SEED LOC MAMMO GUIDE  Result Date: 03/15/2022 CLINICAL DATA:  41 year old female presenting for radioactive seed localization of the left breast prior to lumpectomy. EXAM: MAMMOGRAPHIC GUIDED RADIOACTIVE SEED LOCALIZATION OF  THE LEFT BREAST COMPARISON:  Previous exam(s). FINDINGS: Patient presents for radioactive seed localization prior to left breast lumpectomy. I met with the patient and we discussed the procedure of seed localization including benefits and alternatives. We discussed the high likelihood of a successful procedure. We discussed the risks of the procedure including infection, bleeding, tissue injury and further surgery. We discussed the low dose of radioactivity involved in the procedure. Informed, written consent was given. The usual  time-out protocol was performed immediately prior to the procedure. Using mammographic guidance, sterile technique, 1% lidocaine and an I-125 radioactive seed, the ribbon shaped biopsy marking clip in the lower-inner quadrant of the left breast was localized using a medial approach. The follow-up mammogram images confirm the seed in the expected location and were marked for Dr. Brantley Stage. Follow-up survey of the patient confirms presence of the radioactive seed. Order number of I-125 seed:  [id number]. Total activity:  1.499 millicuries reference Date: 02/05/2022 The patient tolerated the procedure well and was released from the [facility]. She was given instructions regarding seed removal. IMPRESSION: Radioactive seed localization of the left breast. No apparent complications. Electronically Signed   By: Ammie Ferrier M.D.   On: 03/15/2022 13:48   Impression:  No residual carcinoma s/p neoadjuvant chemotherapy and left breast conserving surgery  Stage IIIA (cT2, cN1, cM0) Left Breast LIQ, Invasive Ductal Carcinoma, ER+ / PR- / Her2-, Grade 3, Functionally triple negative, Grade 3    ***  Plan:  Patient is scheduled for CT simulation {date/later today}. ***  -----------------------------------  Blair Promise, PhD, MD  This document serves as a record of services personally performed by Gery Pray, MD. It was created on his behalf by Roney Mans, a trained medical scribe. The creation of this record is based on the scribe's personal observations and the provider's statements to them. This document has been checked and approved by the attending provider.

## 2022-04-14 ENCOUNTER — Ambulatory Visit
Admission: RE | Admit: 2022-04-14 | Discharge: 2022-04-14 | Disposition: A | Discharge status: Home / Self Care | Payer: Federal, State, Local not specified - PPO | Source: Ambulatory Visit | Attending: Radiation Oncology | Admitting: Radiation Oncology

## 2022-04-14 ENCOUNTER — Encounter: Payer: Self-pay | Admitting: Radiation Oncology

## 2022-04-14 ENCOUNTER — Encounter: Payer: Self-pay | Admitting: Oncology

## 2022-04-14 DIAGNOSIS — Z9221 Personal history of antineoplastic chemotherapy: Secondary | ICD-10-CM | POA: Insufficient documentation

## 2022-04-14 DIAGNOSIS — C50312 Malignant neoplasm of lower-inner quadrant of left female breast: Secondary | ICD-10-CM

## 2022-04-14 DIAGNOSIS — Z79899 Other long term (current) drug therapy: Secondary | ICD-10-CM | POA: Diagnosis not present

## 2022-04-14 DIAGNOSIS — Z170 Estrogen receptor positive status [ER+]: Secondary | ICD-10-CM | POA: Insufficient documentation

## 2022-04-15 ENCOUNTER — Other Ambulatory Visit: Payer: Self-pay

## 2022-04-16 ENCOUNTER — Inpatient Hospital Stay: Payer: Federal, State, Local not specified - PPO

## 2022-04-16 ENCOUNTER — Inpatient Hospital Stay: Payer: Federal, State, Local not specified - PPO | Attending: Hematology

## 2022-04-16 ENCOUNTER — Other Ambulatory Visit: Payer: Self-pay

## 2022-04-16 VITALS — BP 112/66 | HR 83 | Temp 97.8°F | Resp 18

## 2022-04-16 DIAGNOSIS — Z79899 Other long term (current) drug therapy: Secondary | ICD-10-CM | POA: Insufficient documentation

## 2022-04-16 DIAGNOSIS — C50312 Malignant neoplasm of lower-inner quadrant of left female breast: Secondary | ICD-10-CM | POA: Insufficient documentation

## 2022-04-16 DIAGNOSIS — Z95828 Presence of other vascular implants and grafts: Secondary | ICD-10-CM

## 2022-04-16 DIAGNOSIS — Z5112 Encounter for antineoplastic immunotherapy: Secondary | ICD-10-CM | POA: Insufficient documentation

## 2022-04-16 LAB — CMP (CANCER CENTER ONLY)
ALT: 40 U/L (ref 0–44)
AST: 33 U/L (ref 15–41)
Albumin: 3.9 g/dL (ref 3.5–5.0)
Alkaline Phosphatase: 84 U/L (ref 38–126)
Anion gap: 9 (ref 5–15)
BUN: 18 mg/dL (ref 6–20)
CO2: 26 mmol/L (ref 22–32)
Calcium: 9.6 mg/dL (ref 8.9–10.3)
Chloride: 104 mmol/L (ref 98–111)
Creatinine: 0.85 mg/dL (ref 0.44–1.00)
GFR, Estimated: >60 mL/min (ref >60)
Glucose, Bld: 128 mg/dL — ABNORMAL HIGH (ref 70–99)
Potassium: 3.3 mmol/L — ABNORMAL LOW (ref 3.5–5.1)
Sodium: 139 mmol/L (ref 135–145)
Total Bilirubin: 0.6 mg/dL (ref 0.3–1.2)
Total Protein: 7.7 g/dL (ref 6.5–8.1)

## 2022-04-16 LAB — CBC WITH DIFFERENTIAL (CANCER CENTER ONLY)
Abs Immature Granulocytes: 0.01 10*3/uL (ref 0.00–0.07)
Basophils Absolute: 0 10*3/uL (ref 0.0–0.1)
Basophils Relative: 1 %
Eosinophils Absolute: 0.2 10*3/uL (ref 0.0–0.5)
Eosinophils Relative: 4 %
HCT: 32.1 % — ABNORMAL LOW (ref 36.0–46.0)
Hemoglobin: 10.7 g/dL — ABNORMAL LOW (ref 12.0–15.0)
Immature Granulocytes: 0 %
Lymphocytes Relative: 27 %
Lymphs Abs: 1.6 10*3/uL (ref 0.7–4.0)
MCH: 31.5 pg (ref 26.0–34.0)
MCHC: 33.3 g/dL (ref 30.0–36.0)
MCV: 94.4 fL (ref 80.0–100.0)
Monocytes Absolute: 0.5 10*3/uL (ref 0.1–1.0)
Monocytes Relative: 8 %
Neutro Abs: 3.5 10*3/uL (ref 1.7–7.7)
Neutrophils Relative %: 60 %
Platelet Count: 264 10*3/uL (ref 150–400)
RBC: 3.4 MIL/uL — ABNORMAL LOW (ref 3.87–5.11)
RDW: 12.9 % (ref 11.5–15.5)
WBC Count: 5.8 10*3/uL (ref 4.0–10.5)
nRBC: 0 % (ref 0.0–0.2)

## 2022-04-16 MED ORDER — SODIUM CHLORIDE 0.9% FLUSH
10.0000 mL | Freq: Once | INTRAVENOUS | Status: AC
  Administered 2022-04-16: 10 mL

## 2022-04-16 MED ORDER — SODIUM CHLORIDE 0.9% FLUSH
10.0000 mL | INTRAVENOUS | Status: DC | PRN
  Administered 2022-04-16: 10 mL

## 2022-04-16 MED ORDER — SODIUM CHLORIDE 0.9 % IV SOLN: Freq: Once | INTRAVENOUS | Status: AC

## 2022-04-16 MED ORDER — SODIUM CHLORIDE 0.9 % IV SOLN
200.0000 mg | Freq: Once | INTRAVENOUS | Status: AC
  Administered 2022-04-16: 200 mg via INTRAVENOUS
  Filled 2022-04-16: qty 200

## 2022-04-16 MED ORDER — HEPARIN SOD (PORK) LOCK FLUSH 100 UNIT/ML IV SOLN
500.0000 [IU] | Freq: Once | INTRAVENOUS | Status: AC | PRN
  Administered 2022-04-16: 500 [IU]

## 2022-04-16 NOTE — Patient Instructions (Signed)
[facility] MEDICAL [facility]COLOGY   Discharge Instructions: Thank you for choosing [facility] to provide your oncology and hematology care.   If you have a lab appointment with the Cancer Center, please go directly to the Cancer Center and check in at the registration area.   Wear comfortable clothing and clothing appropriate for easy access to any Portacath or PICC line.   We strive to give you quality time with your provider. You may need to reschedule your appointment if you arrive late (15 or more minutes).  Arriving late affects you and other patients whose appointments are after yours.  Also, if you miss three or more appointments without notifying the office, you may be dismissed from the clinic at the provider's discretion.      For prescription refill requests, have your pharmacy contact our office and allow 72 hours for refills to be completed.    Today you received the following chemotherapy and/or immunotherapy agents: Pembrolizumab (Keytruda)      To help prevent nausea and vomiting after your treatment, we encourage you to take your nausea medication as directed.  BELOW ARE SYMPTOMS THAT SHOULD BE REPORTED IMMEDIATELY: *FEVER GREATER THAN 100.4 F (38 C) OR HIGHER *CHILLS OR SWEATING *NAUSEA AND VOMITING THAT IS NOT CONTROLLED WITH YOUR NAUSEA MEDICATION *UNUSUAL SHORTNESS OF BREATH *UNUSUAL BRUISING OR BLEEDING *URINARY PROBLEMS (pain or burning when urinating, or frequent urination) *BOWEL PROBLEMS (unusual diarrhea, constipation, pain near the anus) TENDERNESS IN MOUTH AND THROAT WITH OR WITHOUT PRESENCE OF ULCERS (sore throat, sores in mouth, or a toothache) UNUSUAL RASH, SWELLING OR PAIN  UNUSUAL VAGINAL DISCHARGE OR ITCHING   Items with * indicate a potential emergency and should be followed up as soon as possible or go to the Emergency Department if any problems should occur.  Please show the CHEMOTHERAPY ALERT CARD or IMMUNOTHERAPY ALERT  CARD at check-in to the Emergency Department and triage nurse.  Should you have questions after your visit or need to cancel or reschedule your appointment, please contact [facility]  Dept: [phone]  and follow the prompts.  Office hours are 8:00 a.m. to 4:30 p.m. Monday - Friday. Please note that voicemails left after 4:00 p.m. may not be returned until the following business day.  We are closed weekends and major holidays. You have access to a nurse at all times for urgent questions. Please call the main number to the clinic Dept: [phone] and follow the prompts.   For any non-urgent questions, you may also contact your provider using MyChart. We now offer e-Visits for anyone 18 and older to request care online for non-urgent symptoms. For details visit [email or web address].   Also download the MyChart app! Go to the app store, search "MyChart", open the app, select [facility], and log in with your MyChart username and password.  Masks are optional in the cancer centers. If you would like for your care team to wear a mask while they are taking care of you, please let them know. You may have one support person who is at least 40 years old accompany you for your appointments. 

## 2022-04-17 ENCOUNTER — Encounter: Payer: Self-pay | Admitting: Physical Therapy

## 2022-04-17 ENCOUNTER — Encounter: Payer: Self-pay | Admitting: Hematology

## 2022-04-19 ENCOUNTER — Encounter: Payer: Self-pay | Admitting: Physical Therapy

## 2022-04-19 ENCOUNTER — Ambulatory Visit: Payer: Federal, State, Local not specified - PPO | Attending: Surgery | Admitting: Physical Therapy

## 2022-04-19 ENCOUNTER — Encounter: Payer: Self-pay | Admitting: *Deleted

## 2022-04-19 DIAGNOSIS — C50312 Malignant neoplasm of lower-inner quadrant of left female breast: Secondary | ICD-10-CM

## 2022-04-19 DIAGNOSIS — M25612 Stiffness of left shoulder, not elsewhere classified: Secondary | ICD-10-CM | POA: Insufficient documentation

## 2022-04-19 DIAGNOSIS — Z483 Aftercare following surgery for neoplasm: Secondary | ICD-10-CM | POA: Diagnosis not present

## 2022-04-19 DIAGNOSIS — R293 Abnormal posture: Secondary | ICD-10-CM | POA: Insufficient documentation

## 2022-04-19 DIAGNOSIS — R600 Localized edema: Secondary | ICD-10-CM | POA: Insufficient documentation

## 2022-04-19 NOTE — Therapy (Signed)
OUTPATIENT PHYSICAL THERAPY BREAST CANCER POST OP FOLLOW UP   Patient Name: Cheyenne [name] MRN: [id number] DOB:1981/01/18, 41 y.o., female Today's Date: 04/19/2022   PT End of Session - 04/19/22 1549     Visit Number 2    Number of Visits 10    Date for PT Re-Evaluation 05/17/22    PT Start Time 1508    PT Stop Time 1548    PT Time Calculation (min) 40 min    Activity Tolerance Patient tolerated treatment well    Behavior During Therapy [facility] for tasks assessed/performed             Past Medical History:  Diagnosis Date   Anal fissure    Anxiety    Breast cancer ([place]) 08/10/21   Date diagnosis was given   DVT (deep venous thrombosis) ([place])    Incomplete right bundle branch block (RBBB) determined by electrocardiography 09/01/2021   Past Surgical History:  Procedure Laterality Date   BREAST LUMPECTOMY WITH RADIOACTIVE SEED AND SENTINEL LYMPH NODE BIOPSY Left 03/16/2022   Procedure: LEFT BREAST SEED LUMPECTOMY, LEFT SENTINEL [employer];  Surgeon: Erroll Luna, MD;  Location: [facility];  Service: General;  Laterality: Left;  GEN & PEC BLOCK   NO PAST SURGERIES     PORTACATH PLACEMENT N/A 09/08/2021   Procedure: INSERTION PORT-A-CATH;  Surgeon: Erroll Luna, MD;  Location: [facility];  Service: General;  Laterality: N/A;   Patient Active Problem List   Diagnosis Date Noted   Port-A-Cath in place 09/16/2021   Genetic testing 08/31/2021   Malignant neoplasm of lower-inner quadrant of left breast in female, estrogen receptor positive ([place]) 08/17/2021   Anxiety 08/06/2019   Plantar fasciitis 08/06/2019   Fluid collection (edema) in the arms, legs, hands and feet 05/02/2018    PCP: Billie Ruddy, MD   REFERRING PROVIDER: Erroll Luna, MD   REFERRING DIAG: Left breast cancer  THERAPY DIAG:  Stiffness of left shoulder, not elsewhere classified  Localized edema  Aftercare following surgery for neoplasm  Abnormal posture  Malignant  neoplasm of lower-inner quadrant of left breast in female, estrogen receptor positive ([place])  Rationale for Evaluation and Treatment Rehabilitation  ONSET DATE: 08/07/2021  SUBJECTIVE:                                                                                                                                                                                           SUBJECTIVE STATEMENT: I have not been doing the exercises like I should. I am restricted a little bit with how far I can hold it up.   PERTINENT HISTORY:  Patient  was diagnosed on 08/07/2021 with left grade III invasive ductal carcinoma breast cancer. It measures 2.8 cm and is located in the lower inner quadrant. It is weakly (2%) ER positive, PR and HER2 negative with a Ki67 of 40%. 03/16/22- L breast seed lumpectomy and SLNB 0/2. Had post op seroma with drainage from inframammary incision as well as an additional one inferior to axillary incision.   PATIENT GOALS:  Reassess how my recovery is going related to arm function, pain, and swelling.  PAIN:  Are you having pain? No  PRECAUTIONS: Recent Surgery, left UE Lymphedema risk,   ACTIVITY LEVEL / LEISURE: pt was walking prior to surgery - she walked 30 miles in a month prior to her surgery   OBJECTIVE:   PATIENT SURVEYS:  QUICK DASH:  Quick Dash - 04/19/22 0001     Open a tight or new jar Moderate difficulty    Do heavy household chores (wash walls, wash floors) No difficulty    Carry a shopping bag or briefcase No difficulty    Wash your back Mild difficulty    Use a knife to cut food Mild difficulty    Recreational activities in which you take some force or impact through your arm, shoulder, or hand (golf, hammering, tennis) Mild difficulty    During the past week, to what extent has your arm, shoulder or hand problem interfered with your normal social activities with family, friends, neighbors, or groups? Modererately    During the past week, to what extent has your  arm, shoulder or hand problem limited your work or other regular daily activities Slightly    Arm, shoulder, or hand pain. None    Tingling (pins and needles) in your arm, shoulder, or hand Mild    Difficulty Sleeping Mild difficulty    DASH Score 22.73 %              OBSERVATIONS:  Incision in inferior L breast still open and draining but healing. Pt had this area covered with gauze  POSTURE:  Forward head and rounded shoulders posture   UPPER EXTREMITY AROM/PROM:   A/PROM Right 08/19/2021 Left 08/19/2021 Left 04/19/22  Shoulder extension 40 22 62  Shoulder flexion 146 146 152  Shoulder abduction 153 151 123  Shoulder internal rotation 57 52 59  Shoulder external rotation 90 90 85                          (Blank rows = not tested)       CERVICAL AROM: All within normal limits   UPPER EXTREMITY STRENGTH: WNL     LYMPHEDEMA ASSESSMENTS:    LANDMARK RIGHT 08/19/2021 LEFT 08/19/2021 RIGHT 04/19/22 LEFT 04/19/22  10 cm proximal to olecranon process 37 38.6 33.9 36  Olecranon process 29.2 30.3 28.5 29.2  10 cm proximal to ulnar styloid process 25.7 26.8 24.7 23.2  Just proximal to ulnar styloid process 17.5 18.7 17.8 16.7  Across hand at thumb web space 19.8 20 20.5 19.8  At base of 2nd digit 6.3 6.3 6.2 6.1  (Blank rows = not tested)       Surgery type/Date: 03/16/22- L breast lumpectomy and SLNB Number of lymph nodes removed: 0/2 Current/past treatment (chemo, radiation, hormone therapy): completed neoadjuvant chemo, will begin radiation on 04/28/22 Other symptoms:  Heaviness/tightness No Pain Yes in the L breast Pitting edema No Infections No Decreased scar mobility Yes Stemmer sign No  TODAY'S TREATMENT:   04/19/22- Began  PROM to L shoulder in direction of flexion and abduction   PATIENT EDUCATION:  Education details: post op exercises, importance of stretching, need for compression for seroma Person educated: Patient and Spouse Education method:  Explanation Education comprehension: verbalized understanding   HOME EXERCISE PROGRAM:  Reviewed previously given post op HEP.   ASSESSMENT:  CLINICAL IMPRESSION: Pt returns to PT after completing neoadjuvant chemo and undergoing a L breast lumpectomy and SLNB on 03/16/22. Pt demonstrates decreased L shoulder abduction ROM. All other motions have returned to baseline. She also demonstrates some edema at upper L arm compared to R. She has two seromas that are healing but still palpable in area of lumpectomy and SLNB scars. She would benefit from skilled PT services to improve L shoulder ROM, decrease scar tissue, decrease edema/seroma, and progress pt towards independence with a home exercise program.   Pt will benefit from skilled therapeutic intervention to improve on the following deficits: Decreased knowledge of precautions, impaired UE functional use, pain, decreased ROM, postural dysfunction.   PT treatment/interventions: ADL/Self care home management, Therapeutic exercises, Therapeutic activity, Patient/Family education, Self Care, Joint mobilization, Orthotic/Fit training, Manual lymph drainage, scar mobilization, Vasopneumatic device, Manual therapy, and Re-evaluation     GOALS: Goals reviewed with patient? Yes  LONG TERM GOALS:  (STG=LTG)  GOALS Name Target Date  Goal status  1 Pt will demonstrate she has regained full shoulder ROM and function post operatively compared to baselines.  Baseline: 05/17/2022 INITIAL  2 Pt will demonstrate 150 degrees of L shoulder abduction to allow her to reach out to the side.  05/17/2022 INITIAL  3 Pt will report independence with scar mobilization once her wound has completely healed.  05/17/2022 INITIAL  4 Pt will be independent in a home exercise program for continued stretching and strengthening.  05/17/2022 INITIAL     PLAN: PT FREQUENCY/DURATION: 2x/wk for 4 wks  PLAN FOR NEXT SESSION: PROM to improve L shoulder abduction, give  dowel exercises, MLD to axillary seroma and to L breast to improve comfort (be careful of healing wound), make sure she is scheduled for ABC class   [facility]  [address], [address]  [place] [address]  [phone]  After Breast Cancer Class It is recommended you attend the ABC class to be educated on lymphedema risk reduction. This class is free of charge and lasts for 1 hour. It is a 1-time class. You will need to download the Webex app either on your phone or computer. We will send you a link the night before or the morning of the class. You should be able to click on that link to join the class. This is not a confidential class. You don't have to turn your camera on, but other participants may be able to see your email address.  Scar massage You can begin gentle scar massage to you incision sites. Gently place one hand on the incision and move the skin (without sliding on the skin) in various directions. Do this for a few minutes and then you can gently massage either coconut oil or vitamin E cream into the scars.  Compression garment You should continue wearing your compression bra until you feel like you no longer have swelling.  Home exercise Program Continue doing the exercises you were given until you feel like you can do them without feeling any tightness at the end.   Walking Program Studies show that 30 minutes of walking per day (fast enough to elevate your  heart rate) can significantly reduce the risk of a cancer recurrence. If you can't walk due to other medical reasons, we encourage you to find another activity you could do (like a stationary bike or water exercise).  Posture After breast cancer surgery, people frequently sit with rounded shoulders posture because it puts their incisions on slack and feels better. If you sit like this and scar tissue forms in that position, you can become very tight and have pain sitting or standing with good  posture. Try to be aware of your posture and sit and stand up tall to heal properly.  Follow up PT: It is recommended you return every 3 months for the first 3 years following surgery to be assessed on the SOZO machine for an L-Dex score. This helps prevent clinically significant lymphedema in 95% of patients. These follow up screens are 10 minute appointments that you are not billed for.  [facility] [place], PT 04/19/2022, 3:52 PM

## 2022-04-20 ENCOUNTER — Encounter: Payer: Self-pay | Admitting: Hematology

## 2022-04-20 DIAGNOSIS — Z170 Estrogen receptor positive status [ER+]: Secondary | ICD-10-CM | POA: Diagnosis not present

## 2022-04-20 DIAGNOSIS — C50312 Malignant neoplasm of lower-inner quadrant of left female breast: Secondary | ICD-10-CM | POA: Diagnosis not present

## 2022-04-21 ENCOUNTER — Ambulatory Visit: Payer: Federal, State, Local not specified - PPO | Admitting: Physical Therapy

## 2022-04-21 ENCOUNTER — Encounter: Payer: Self-pay | Admitting: Physical Therapy

## 2022-04-21 DIAGNOSIS — R600 Localized edema: Secondary | ICD-10-CM | POA: Diagnosis not present

## 2022-04-21 DIAGNOSIS — M25612 Stiffness of left shoulder, not elsewhere classified: Secondary | ICD-10-CM | POA: Diagnosis not present

## 2022-04-21 DIAGNOSIS — R293 Abnormal posture: Secondary | ICD-10-CM | POA: Diagnosis not present

## 2022-04-21 DIAGNOSIS — Z483 Aftercare following surgery for neoplasm: Secondary | ICD-10-CM

## 2022-04-21 DIAGNOSIS — C50312 Malignant neoplasm of lower-inner quadrant of left female breast: Secondary | ICD-10-CM

## 2022-04-21 NOTE — Therapy (Signed)
OUTPATIENT PHYSICAL THERAPY BREAST CANCER TREATMENT   Patient Name: Cheyenne [name] MRN: [id number] DOB:16-Sep-1980, 41 y.o., female Today's Date: 04/21/2022   PT End of Session - 04/21/22 0906     Visit Number 3    Number of Visits 10    Date for PT Re-Evaluation 05/17/22    PT Start Time 0905    PT Stop Time 0952    PT Time Calculation (min) 47 min    Activity Tolerance Patient tolerated treatment well    Behavior During Therapy [facility] for tasks assessed/performed             Past Medical History:  Diagnosis Date   Anal fissure    Anxiety    Breast cancer ([place]) 08/10/21   Date diagnosis was given   DVT (deep venous thrombosis) (HCC)    Incomplete right bundle branch block (RBBB) determined by electrocardiography 09/01/2021   Past Surgical History:  Procedure Laterality Date   BREAST LUMPECTOMY WITH RADIOACTIVE SEED AND SENTINEL LYMPH NODE BIOPSY Left 03/16/2022   Procedure: LEFT BREAST SEED LUMPECTOMY, LEFT SENTINEL [place];  Surgeon: Erroll Luna, MD;  Location: [facility] [place];  Service: General;  Laterality: Left;  GEN & PEC BLOCK   NO PAST SURGERIES     PORTACATH PLACEMENT N/A 09/08/2021   Procedure: INSERTION PORT-A-CATH;  Surgeon: Erroll Luna, MD;  Location: [facility];  Service: General;  Laterality: N/A;   Patient Active Problem List   Diagnosis Date Noted   Port-A-Cath in place 09/16/2021   Genetic testing 08/31/2021   Malignant neoplasm of lower-inner quadrant of left breast in female, estrogen receptor positive ([place]) 08/17/2021   Anxiety 08/06/2019   Plantar fasciitis 08/06/2019   Fluid collection (edema) in the arms, legs, hands and feet 05/02/2018    PCP: Billie Ruddy, MD   REFERRING PROVIDER: Erroll Luna, MD   REFERRING DIAG: Left breast cancer  THERAPY DIAG:  Stiffness of left shoulder, not elsewhere classified  Localized edema  Aftercare following surgery for neoplasm  Abnormal posture  Malignant neoplasm of  lower-inner quadrant of left breast in female, estrogen receptor positive ([place])  Rationale for Evaluation and Treatment Rehabilitation  ONSET DATE: 08/07/2021  SUBJECTIVE:                                                                                                                                                                                           SUBJECTIVE STATEMENT: My ROM is about the same. I have been trying to use my arm a little bit more.   PERTINENT HISTORY:  Patient was diagnosed on 08/07/2021 with left grade III invasive  ductal carcinoma breast cancer. It measures 2.8 cm and is located in the lower inner quadrant. It is weakly (2%) ER positive, PR and HER2 negative with a Ki67 of 40%. 03/16/22- L breast seed lumpectomy and SLNB 0/2. Had post op seroma with drainage from inframammary incision as well as an additional one inferior to axillary incision.   PATIENT GOALS:  Reassess how my recovery is going related to arm function, pain, and swelling.  PAIN:  Are you having pain? No  PRECAUTIONS: Recent Surgery, left UE Lymphedema risk,   ACTIVITY LEVEL / LEISURE: pt was walking prior to surgery - she walked 30 miles in a month prior to her surgery   OBJECTIVE:   PATIENT SURVEYS:  QUICK DASH:     OBSERVATIONS:  Incision in inferior L breast still open and draining but healing. Pt had this area covered with gauze  POSTURE:  Forward head and rounded shoulders posture   UPPER EXTREMITY AROM/PROM:   A/PROM Right 08/19/2021 Left 08/19/2021 Left 04/19/22  Shoulder extension 40 22 62  Shoulder flexion 146 146 152  Shoulder abduction 153 151 123  Shoulder internal rotation 57 52 59  Shoulder external rotation 90 90 85                          (Blank rows = not tested)       CERVICAL AROM: All within normal limits   UPPER EXTREMITY STRENGTH: WNL     LYMPHEDEMA ASSESSMENTS:    LANDMARK RIGHT 08/19/2021 LEFT 08/19/2021 RIGHT 04/19/22 LEFT 04/19/22  10 cm proximal  to olecranon process 37 38.6 33.9 36  Olecranon process 29.2 30.3 28.5 29.2  10 cm proximal to ulnar styloid process 25.7 26.8 24.7 23.2  Just proximal to ulnar styloid process 17.5 18.7 17.8 16.7  Across hand at thumb web space 19.8 20 20.5 19.8  At base of 2nd digit 6.3 6.3 6.2 6.1  (Blank rows = not tested)       Surgery type/Date: 03/16/22- L breast lumpectomy and SLNB Number of lymph nodes removed: 0/2 Current/past treatment (chemo, radiation, hormone therapy): completed neoadjuvant chemo, will begin radiation on 04/28/22 Other symptoms:  Heaviness/tightness No Pain Yes in the L breast Pitting edema No Infections No Decreased scar mobility Yes Stemmer sign No   PATIENT EDUCATION:  Education details: post op exercises, importance of stretching, need for compression for seroma Person educated: Patient and Spouse Education method: Explanation Education comprehension: verbalized understanding   TODAY'S TREATMENT:  04/21/22- Instructed pt in supine dowel abduction and flexion x 10 reps each with 5 sec holds, PROM to L shoulder in direction of abduction and flexion with pt able to achieve full ROM by end of session. MLD to L breast in area of seroma in L axilla and inferior breast (being careful of healing wound) as follows in supine: short neck, 5 diaphragmatic breaths, R axillary nodes and establishment of interaxillary pathway, L inguinal nodes and establishment of axillo inguinal pathway then L breast in area of seroma moving fluid towards pathways.  HOME EXERCISE PROGRAM:  Reviewed previously given post op HEP.   ASSESSMENT:  CLINICAL IMPRESSION: Pt has been wearing her post op surgical wrap for additional compression to help improve wound healing and to decrease seromas. Instructed pt in AAROM exercises today and issued these as part of an HEP. Began PROM to L shoulder as well as MLD to L breast in area of seromas.   Pt will benefit from  skilled therapeutic intervention to  improve on the following deficits: Decreased knowledge of precautions, impaired UE functional use, pain, decreased ROM, postural dysfunction.   PT treatment/interventions: ADL/Self care home management, Therapeutic exercises, Therapeutic activity, Patient/Family education, Self Care, Joint mobilization, Orthotic/Fit training, Manual lymph drainage, scar mobilization, Vasopneumatic device, Manual therapy, and Re-evaluation     GOALS: Goals reviewed with patient? Yes  LONG TERM GOALS:  (STG=LTG)  GOALS Name Target Date  Goal status  1 Pt will demonstrate she has regained full shoulder ROM and function post operatively compared to baselines.  Baseline: 05/17/2022 INITIAL  2 Pt will demonstrate 150 degrees of L shoulder abduction to allow her to reach out to the side.  05/17/2022 INITIAL  3 Pt will report independence with scar mobilization once her wound has completely healed.  05/17/2022 INITIAL  4 Pt will be independent in a home exercise program for continued stretching and strengthening.  05/17/2022 INITIAL     PLAN: PT FREQUENCY/DURATION: 2x/wk for 4 wks  PLAN FOR NEXT SESSION: PROM to improve L shoulder abduction, give dowel exercises, MLD to axillary seroma and to L breast to improve comfort (be careful of healing wound)   [facility]  [address], [address]  [place] [address]  [phone]  After Breast Cancer Class It is recommended you attend the ABC class to be educated on lymphedema risk reduction. This class is free of charge and lasts for 1 hour. It is a 1-time class. You will need to download the Webex app either on your phone or computer. We will send you a link the night before or the morning of the class. You should be able to click on that link to join the class. This is not a confidential class. You don't have to turn your camera on, but other participants may be able to see your email address.  Scar massage You can begin gentle scar  massage to you incision sites. Gently place one hand on the incision and move the skin (without sliding on the skin) in various directions. Do this for a few minutes and then you can gently massage either coconut oil or vitamin E cream into the scars.  Compression garment You should continue wearing your compression bra until you feel like you no longer have swelling.  Home exercise Program Continue doing the exercises you were given until you feel like you can do them without feeling any tightness at the end.   Walking Program Studies show that 30 minutes of walking per day (fast enough to elevate your heart rate) can significantly reduce the risk of a cancer recurrence. If you can't walk due to other medical reasons, we encourage you to find another activity you could do (like a stationary bike or water exercise).  Posture After breast cancer surgery, people frequently sit with rounded shoulders posture because it puts their incisions on slack and feels better. If you sit like this and scar tissue forms in that position, you can become very tight and have pain sitting or standing with good posture. Try to be aware of your posture and sit and stand up tall to heal properly.  Follow up PT: It is recommended you return every 3 months for the first 3 years following surgery to be assessed on the SOZO machine for an L-Dex score. This helps prevent clinically significant lymphedema in 95% of patients. These follow up screens are 10 minute appointments that you are not billed for.  [facility] [place],  PT 04/21/2022, 9:56 AM

## 2022-04-22 ENCOUNTER — Other Ambulatory Visit: Payer: Self-pay

## 2022-04-26 ENCOUNTER — Ambulatory Visit: Payer: Federal, State, Local not specified - PPO | Admitting: Physical Therapy

## 2022-04-26 ENCOUNTER — Encounter: Payer: Self-pay | Admitting: Physical Therapy

## 2022-04-26 DIAGNOSIS — Z483 Aftercare following surgery for neoplasm: Secondary | ICD-10-CM | POA: Diagnosis not present

## 2022-04-26 DIAGNOSIS — R293 Abnormal posture: Secondary | ICD-10-CM | POA: Diagnosis not present

## 2022-04-26 DIAGNOSIS — R600 Localized edema: Secondary | ICD-10-CM | POA: Diagnosis not present

## 2022-04-26 DIAGNOSIS — M25612 Stiffness of left shoulder, not elsewhere classified: Secondary | ICD-10-CM | POA: Diagnosis not present

## 2022-04-26 DIAGNOSIS — C50312 Malignant neoplasm of lower-inner quadrant of left female breast: Secondary | ICD-10-CM

## 2022-04-26 NOTE — Therapy (Signed)
OUTPATIENT PHYSICAL THERAPY BREAST CANCER TREATMENT   Patient Name: Cheyenne [name] MRN: [id number] DOB:10/28/1980, 41 y.o., female Today's Date: 04/26/2022   PT End of Session - 04/26/22 0814     Visit Number 4    Number of Visits 10    Date for PT Re-Evaluation 05/17/22    PT Start Time 0805    PT Stop Time 0849    PT Time Calculation (min) 44 min    Activity Tolerance Patient tolerated treatment well    Behavior During Therapy [facility] for tasks assessed/performed             Past Medical History:  Diagnosis Date   Anal fissure    Anxiety    Breast cancer ([place]) 08/10/21   Date diagnosis was given   DVT (deep venous thrombosis) ([place])    Incomplete right bundle branch block (RBBB) determined by electrocardiography 09/01/2021   Past Surgical History:  Procedure Laterality Date   BREAST LUMPECTOMY WITH RADIOACTIVE SEED AND SENTINEL LYMPH NODE BIOPSY Left 03/16/2022   Procedure: LEFT BREAST SEED LUMPECTOMY, LEFT SENTINEL [place];  Surgeon: Erroll Luna, MD;  Location: [place];  Service: General;  Laterality: Left;  GEN & PEC BLOCK   NO PAST SURGERIES     PORTACATH PLACEMENT N/A 09/08/2021   Procedure: INSERTION PORT-A-CATH;  Surgeon: Erroll Luna, MD;  Location: [facility];  Service: General;  Laterality: N/A;   Patient Active Problem List   Diagnosis Date Noted   Port-A-Cath in place 09/16/2021   Genetic testing 08/31/2021   Malignant neoplasm of lower-inner quadrant of left breast in female, estrogen receptor positive ([place]) 08/17/2021   Anxiety 08/06/2019   Plantar fasciitis 08/06/2019   Fluid collection (edema) in the arms, legs, hands and feet 05/02/2018    PCP: Billie Ruddy, MD   REFERRING PROVIDER: Erroll Luna, MD   REFERRING DIAG: Left breast cancer  THERAPY DIAG:  Stiffness of left shoulder, not elsewhere classified  Localized edema  Aftercare following surgery for neoplasm  Abnormal posture  Malignant neoplasm of  lower-inner quadrant of left breast in female, estrogen receptor positive ([place])  Rationale for Evaluation and Treatment Rehabilitation  ONSET DATE: 08/07/2021  SUBJECTIVE:                                                                                                                                                                                           SUBJECTIVE STATEMENT: I had some redness where the wound is. I am waiting to hear back from the doctor. It has been more sore in the last few days.   PERTINENT HISTORY:  Patient  was diagnosed on 08/07/2021 with left grade III invasive ductal carcinoma breast cancer. It measures 2.8 cm and is located in the lower inner quadrant. It is weakly (2%) ER positive, PR and HER2 negative with a Ki67 of 40%. 03/16/22- L breast seed lumpectomy and SLNB 0/2. Had post op seroma with drainage from inframammary incision as well as an additional one inferior to axillary incision.   PATIENT GOALS:  Reassess how my recovery is going related to arm function, pain, and swelling.  PAIN:  Are you having pain? Yes: NPRS scale: 4/10 Pain location: inferior breast Pain description: soreness Aggravating factors: nothing Relieving factors: nothing  PRECAUTIONS: Recent Surgery, left UE Lymphedema risk,   ACTIVITY LEVEL / LEISURE: pt was walking prior to surgery - she walked 30 miles in a month prior to her surgery   OBJECTIVE:   PATIENT SURVEYS:  QUICK DASH:     OBSERVATIONS:  Incision in inferior L breast still open and draining but healing. Pt had this area covered with gauze  POSTURE:  Forward head and rounded shoulders posture   UPPER EXTREMITY AROM/PROM:   A/PROM Right 08/19/2021 Left 08/19/2021 Left 04/19/22  Shoulder extension 40 22 62  Shoulder flexion 146 146 152  Shoulder abduction 153 151 123  Shoulder internal rotation 57 52 59  Shoulder external rotation 90 90 85                          (Blank rows = not tested)       CERVICAL  AROM: All within normal limits   UPPER EXTREMITY STRENGTH: WNL     LYMPHEDEMA ASSESSMENTS:    LANDMARK RIGHT 08/19/2021 LEFT 08/19/2021 RIGHT 04/19/22 LEFT 04/19/22  10 cm proximal to olecranon process 37 38.6 33.9 36  Olecranon process 29.2 30.3 28.5 29.2  10 cm proximal to ulnar styloid process 25.7 26.8 24.7 23.2  Just proximal to ulnar styloid process 17.5 18.7 17.8 16.7  Across hand at thumb web space 19.8 20 20.5 19.8  At base of 2nd digit 6.3 6.3 6.2 6.1  (Blank rows = not tested)       Surgery type/Date: 03/16/22- L breast lumpectomy and SLNB Number of lymph nodes removed: 0/2 Current/past treatment (chemo, radiation, hormone therapy): completed neoadjuvant chemo, will begin radiation on 04/28/22 Other symptoms:  Heaviness/tightness No Pain Yes in the L breast Pitting edema No Infections No Decreased scar mobility Yes Stemmer sign No   PATIENT EDUCATION:  Education details: post op exercises, importance of stretching, need for compression for seroma Person educated: Patient and Spouse Education method: Explanation Education comprehension: verbalized understanding   TODAY'S TREATMENT:  04/26/22- Pulleys in to flexion and abduction x 2 min each. Ball up wall x 10 reps in direction of flexion and 10 reps in direction of abduction. Pt had increased soreness near seroma where wound is healing so avoided this area today. No increase redness noted. Wound looked a beefy red at lateral edge but healing well. PROM to L shoulder in to abduction, flexion and ER with prolonged holds while performing STM to L pec and MFR to L axilla.   04/21/22- Instructed pt in supine dowel abduction and flexion x 10 reps each with 5 sec holds, PROM to L shoulder in direction of abduction and flexion with pt able to achieve full ROM by end of session. MLD to L breast in area of seroma in L axilla and inferior breast (being careful of healing wound) as follows  in supine: short neck, 5 diaphragmatic  breaths, R axillary nodes and establishment of interaxillary pathway, L inguinal nodes and establishment of axillo inguinal pathway then L breast in area of seroma moving fluid towards pathways.  HOME EXERCISE PROGRAM:  Reviewed previously given post op HEP.   ASSESSMENT:  CLINICAL IMPRESSION: Pt was having more tenderness in area of healing wound so did not work in this area today. There is no increased redness surrounding the wound but the wound itself is a beefier red though appears to be healing well. Began AAROM exercises today and continued with PROM in all directions to decrease tightness.   Pt will benefit from skilled therapeutic intervention to improve on the following deficits: Decreased knowledge of precautions, impaired UE functional use, pain, decreased ROM, postural dysfunction.   PT treatment/interventions: ADL/Self care home management, Therapeutic exercises, Therapeutic activity, Patient/Family education, Self Care, Joint mobilization, Orthotic/Fit training, Manual lymph drainage, scar mobilization, Vasopneumatic device, Manual therapy, and Re-evaluation     GOALS: Goals reviewed with patient? Yes  LONG TERM GOALS:  (STG=LTG)  GOALS Name Target Date  Goal status  1 Pt will demonstrate she has regained full shoulder ROM and function post operatively compared to baselines.  Baseline: 05/17/2022 INITIAL  2 Pt will demonstrate 150 degrees of L shoulder abduction to allow her to reach out to the side.  05/17/2022 INITIAL  3 Pt will report independence with scar mobilization once her wound has completely healed.  05/17/2022 INITIAL  4 Pt will be independent in a home exercise program for continued stretching and strengthening.  05/17/2022 INITIAL     PLAN: PT FREQUENCY/DURATION: 2x/wk for 4 wks  PLAN FOR NEXT SESSION: PROM to improve L shoulder abduction, pulleys, ball, MLD to axillary seroma and to L breast to improve comfort (be careful of healing  wound)   [facility]  [address], [address]  [place] [address]  [phone]  After Breast Cancer Class It is recommended you attend the ABC class to be educated on lymphedema risk reduction. This class is free of charge and lasts for 1 hour. It is a 1-time class. You will need to download the Webex app either on your phone or computer. We will send you a link the night before or the morning of the class. You should be able to click on that link to join the class. This is not a confidential class. You don't have to turn your camera on, but other participants may be able to see your email address.  Scar massage You can begin gentle scar massage to you incision sites. Gently place one hand on the incision and move the skin (without sliding on the skin) in various directions. Do this for a few minutes and then you can gently massage either coconut oil or vitamin E cream into the scars.  Compression garment You should continue wearing your compression bra until you feel like you no longer have swelling.  Home exercise Program Continue doing the exercises you were given until you feel like you can do them without feeling any tightness at the end.   Walking Program Studies show that 30 minutes of walking per day (fast enough to elevate your heart rate) can significantly reduce the risk of a cancer recurrence. If you can't walk due to other medical reasons, we encourage you to find another activity you could do (like a stationary bike or water exercise).  Posture After breast cancer surgery, people frequently sit with rounded shoulders  posture because it puts their incisions on slack and feels better. If you sit like this and scar tissue forms in that position, you can become very tight and have pain sitting or standing with good posture. Try to be aware of your posture and sit and stand up tall to heal properly.  Follow up PT: It is recommended you return every 3  months for the first 3 years following surgery to be assessed on the SOZO machine for an L-Dex score. This helps prevent clinically significant lymphedema in 95% of patients. These follow up screens are 10 minute appointments that you are not billed for.  [facility] [place], PT 04/26/2022, 8:59 AM

## 2022-04-28 ENCOUNTER — Ambulatory Visit: Payer: Federal, State, Local not specified - PPO | Admitting: Radiation Oncology

## 2022-04-28 ENCOUNTER — Ambulatory Visit: Payer: Federal, State, Local not specified - PPO | Admitting: Physical Therapy

## 2022-04-28 ENCOUNTER — Encounter: Payer: Self-pay | Admitting: Physical Therapy

## 2022-04-28 DIAGNOSIS — M25612 Stiffness of left shoulder, not elsewhere classified: Secondary | ICD-10-CM | POA: Diagnosis not present

## 2022-04-28 DIAGNOSIS — R293 Abnormal posture: Secondary | ICD-10-CM | POA: Diagnosis not present

## 2022-04-28 DIAGNOSIS — C50312 Malignant neoplasm of lower-inner quadrant of left female breast: Secondary | ICD-10-CM

## 2022-04-28 DIAGNOSIS — R600 Localized edema: Secondary | ICD-10-CM | POA: Diagnosis not present

## 2022-04-28 DIAGNOSIS — Z483 Aftercare following surgery for neoplasm: Secondary | ICD-10-CM

## 2022-04-28 NOTE — Therapy (Signed)
OUTPATIENT PHYSICAL THERAPY BREAST CANCER TREATMENT   Patient Name: Cheyenne [name] MRN: [id number] DOB:06-17-1981, 41 y.o., female Today's Date: 04/28/2022   PT End of Session - 04/28/22 0917     Visit Number 5    Number of Visits 10    Date for PT Re-Evaluation 05/17/22    PT Start Time 0908    PT Stop Time 0955    PT Time Calculation (min) 47 min    Activity Tolerance Patient tolerated treatment well    Behavior During Therapy [facility] for tasks assessed/performed             Past Medical History:  Diagnosis Date   Anal fissure    Anxiety    Breast cancer ([place]) 08/10/21   Date diagnosis was given   DVT (deep venous thrombosis) (HCC)    Incomplete right bundle branch block (RBBB) determined by electrocardiography 09/01/2021   Past Surgical History:  Procedure Laterality Date   BREAST LUMPECTOMY WITH RADIOACTIVE SEED AND SENTINEL LYMPH NODE BIOPSY Left 03/16/2022   Procedure: LEFT BREAST SEED LUMPECTOMY, LEFT SENTINEL [place];  Surgeon: Erroll Luna, MD;  Location: [place];  Service: General;  Laterality: Left;  GEN & PEC BLOCK   NO PAST SURGERIES     PORTACATH PLACEMENT N/A 09/08/2021   Procedure: INSERTION PORT-A-CATH;  Surgeon: Erroll Luna, MD;  [facility];  Service: General;  Laterality: N/A;   Patient Active Problem List   Diagnosis Date Noted   Port-A-Cath in place 09/16/2021   Genetic testing 08/31/2021   Malignant neoplasm of lower-inner quadrant of left breast in female, estrogen receptor positive ([place]) 08/17/2021   Anxiety 08/06/2019   Plantar fasciitis 08/06/2019   Fluid collection (edema) in the arms, legs, hands and feet 05/02/2018    PCP: Billie Ruddy, MD   REFERRING PROVIDER: Erroll Luna, MD   REFERRING DIAG: Left breast cancer  THERAPY DIAG:  Stiffness of left shoulder, not elsewhere classified  Localized edema  Aftercare following surgery for neoplasm  Abnormal posture  Malignant neoplasm of  lower-inner quadrant of left breast in female, estrogen receptor positive ([place])  Rationale for Evaluation and Treatment Rehabilitation  ONSET DATE: 08/07/2021  SUBJECTIVE:                                                                                                                                                                                           SUBJECTIVE STATEMENT: They delayed radiation by a week for the wound to close.   PERTINENT HISTORY:  Patient was diagnosed on 08/07/2021 with left grade III invasive ductal carcinoma breast cancer. It measures 2.8  cm and is located in the lower inner quadrant. It is weakly (2%) ER positive, PR and HER2 negative with a Ki67 of 40%. 03/16/22- L breast seed lumpectomy and SLNB 0/2. Had post op seroma with drainage from inframammary incision as well as an additional one inferior to axillary incision.   PATIENT GOALS:  Reassess how my recovery is going related to arm function, pain, and swelling.  PAIN:  Are you having pain? Yes: NPRS scale: 3/10 Pain location: inferior breast Pain description: soreness Aggravating factors: nothing Relieving factors: nothing  PRECAUTIONS: Recent Surgery, left UE Lymphedema risk,   ACTIVITY LEVEL / LEISURE: pt was walking prior to surgery - she walked 30 miles in a month prior to her surgery   OBJECTIVE:   PATIENT SURVEYS:  QUICK DASH:     OBSERVATIONS:  Incision in inferior L breast still open and draining but healing. Pt had this area covered with gauze  POSTURE:  Forward head and rounded shoulders posture   UPPER EXTREMITY AROM/PROM:   A/PROM Right 08/19/2021 Left 08/19/2021 Left 04/19/22 Left 04/28/22  Shoulder extension 40 22 62 72  Shoulder flexion 146 146 152 167  Shoulder abduction 153 151 123 160  Shoulder internal rotation 57 52 59 70  Shoulder external rotation 90 90 85 90                          (Blank rows = not tested)       CERVICAL AROM: All within normal limits   UPPER  EXTREMITY STRENGTH: WNL     LYMPHEDEMA ASSESSMENTS:    LANDMARK RIGHT 08/19/2021 LEFT 08/19/2021 RIGHT 04/19/22 LEFT 04/19/22  10 cm proximal to olecranon process 37 38.6 33.9 36  Olecranon process 29.2 30.3 28.5 29.2  10 cm proximal to ulnar styloid process 25.7 26.8 24.7 23.2  Just proximal to ulnar styloid process 17.5 18.7 17.8 16.7  Across hand at thumb web space 19.8 20 20.5 19.8  At base of 2nd digit 6.3 6.3 6.2 6.1  (Blank rows = not tested)       Surgery type/Date: 03/16/22- L breast lumpectomy and SLNB Number of lymph nodes removed: 0/2 Current/past treatment (chemo, radiation, hormone therapy): completed neoadjuvant chemo, will begin radiation on 04/28/22 Other symptoms:  Heaviness/tightness No Pain Yes in the L breast Pitting edema No Infections No Decreased scar mobility Yes Stemmer sign No   PATIENT EDUCATION:  Education details: post op exercises, importance of stretching, need for compression for seroma Person educated: Patient and Spouse Education method: Explanation Education comprehension: verbalized understanding   TODAY'S TREATMENT:  04/28/22- Pulleys in to flexion and abduction x 2 min each. Ball up wall x 10 reps in direction of flexion and 10 reps in direction of abduction. Instructed pt in supine scapular strengthening exercises with yellow band and issued these as part of pt's home exercise program (pt return demonstrated 10 reps of each): flexion (narrow and wide grip), ER, horizontal abduction, and diagonals bilaterally. PROM to L shoulder in to abduction, flexion and scaption with prolonged holds while performing STM to L pec and MFR to L axilla. STM performed to L pec and gently to inferior breast where pt had increased tenderness. Wound is healing well today with less drainage noted.   04/26/22- Pulleys in to flexion and abduction x 2 min each. Ball up wall x 10 reps in direction of flexion and 10 reps in direction of abduction. Pt had increased  soreness near seroma where  wound is healing so avoided this area today. No increase redness noted. Wound looked a beefy red at lateral edge but healing well. PROM to L shoulder in to abduction, flexion and ER with prolonged holds while performing STM to L pec and MFR to L axilla.   04/21/22- Instructed pt in supine dowel abduction and flexion x 10 reps each with 5 sec holds, PROM to L shoulder in direction of abduction and flexion with pt able to achieve full ROM by end of session. MLD to L breast in area of seroma in L axilla and inferior breast (being careful of healing wound) as follows in supine: short neck, 5 diaphragmatic breaths, R axillary nodes and establishment of interaxillary pathway, L inguinal nodes and establishment of axillo inguinal pathway then L breast in area of seroma moving fluid towards pathways.  HOME EXERCISE PROGRAM:  Reviewed previously given post op HEP.   ASSESSMENT:  CLINICAL IMPRESSION: Seroma in L axilla is left palpable and there is less fibrosis. It is easier to palpate in sitting so educated pt to perform soft tissue mobilization to this area to help decrease fibrosis. Pt's wound is also healing well though her radiation is delayed a week to allow further wound healing prior to beginning. Remeasured her ROM today and it has returned to baseline. She still has some tightness at end range but this is improving.   Pt will benefit from skilled therapeutic intervention to improve on the following deficits: Decreased knowledge of precautions, impaired UE functional use, pain, decreased ROM, postural dysfunction.   PT treatment/interventions: ADL/Self care home management, Therapeutic exercises, Therapeutic activity, Patient/Family education, Self Care, Joint mobilization, Orthotic/Fit training, Manual lymph drainage, scar mobilization, Vasopneumatic device, Manual therapy, and Re-evaluation     GOALS: Goals reviewed with patient? Yes  LONG TERM GOALS:   (STG=LTG)  GOALS Name Target Date  Goal status  1 Pt will demonstrate she has regained full shoulder ROM and function post operatively compared to baselines.  Baseline: 05/17/2022 INITIAL  2 Pt will demonstrate 150 degrees of L shoulder abduction to allow her to reach out to the side.  05/17/2022 INITIAL  3 Pt will report independence with scar mobilization once her wound has completely healed.  05/17/2022 INITIAL  4 Pt will be independent in a home exercise program for continued stretching and strengthening.  05/17/2022 INITIAL     PLAN: PT FREQUENCY/DURATION: 2x/wk for 4 wks  PLAN FOR NEXT SESSION: PROM to improve L shoulder abduction, pulleys, ball, MLD to axillary seroma and to L breast to improve comfort (be careful of healing wound)   [facility]  [address], [address]  [place] [address]  [phone]  After Breast Cancer Class It is recommended you attend the ABC class to be educated on lymphedema risk reduction. This class is free of charge and lasts for 1 hour. It is a 1-time class. You will need to download the Webex app either on your phone or computer. We will send you a link the night before or the morning of the class. You should be able to click on that link to join the class. This is not a confidential class. You don't have to turn your camera on, but other participants may be able to see your email address.  Scar massage You can begin gentle scar massage to you incision sites. Gently place one hand on the incision and move the skin (without sliding on the skin) in various directions. Do this for a  few minutes and then you can gently massage either coconut oil or vitamin E cream into the scars.  Compression garment You should continue wearing your compression bra until you feel like you no longer have swelling.  Home exercise Program Continue doing the exercises you were given until you feel like you can do them without feeling any tightness  at the end.   Walking Program Studies show that 30 minutes of walking per day (fast enough to elevate your heart rate) can significantly reduce the risk of a cancer recurrence. If you can't walk due to other medical reasons, we encourage you to find another activity you could do (like a stationary bike or water exercise).  Posture After breast cancer surgery, people frequently sit with rounded shoulders posture because it puts their incisions on slack and feels better. If you sit like this and scar tissue forms in that position, you can become very tight and have pain sitting or standing with good posture. Try to be aware of your posture and sit and stand up tall to heal properly.  Follow up PT: It is recommended you return every 3 months for the first 3 years following surgery to be assessed on the SOZO machine for an L-Dex score. This helps prevent clinically significant lymphedema in 95% of patients. These follow up screens are 10 minute appointments that you are not billed for.  [facility], PT 04/28/2022, 9:56 AM

## 2022-04-28 NOTE — Patient Instructions (Signed)
Over Head Pull: Narrow and Wide Grip   Cancer Rehab [id number]   On back, knees bent, feet flat, band across thighs, elbows straight but relaxed. Pull hands apart (start). Keeping elbows straight, bring arms up and over head, hands toward floor. Keep pull steady on band. Hold momentarily. Return slowly, keeping pull steady, back to start. Then do same with a wider grip on the band (past shoulder width) Repeat _10__ times. Band color __yellow____   Side Pull: Double Arm   On back, knees bent, feet flat. Arms perpendicular to body, shoulder level, elbows straight but relaxed. Pull arms out to sides, elbows straight. Resistance band comes across collarbones, hands toward floor. Hold momentarily. Slowly return to starting position. Repeat _10__ times. Band color _yellow____   Sword   On back, knees bent, feet flat, left hand on left hip, right hand above left. Pull right arm DIAGONALLY (hip to shoulder) across chest. Bring right arm along head toward floor. Hold momentarily. Thumb is pointed down when by opposite hip and rotates backwards (pointing down towards floor). Slowly return to starting position. Repeat _10__ times. Do with left arm. Band color _yellow_____   Shoulder Rotation: Double Arm   On back, knees bent, feet flat, elbows tucked at sides, bent 90, hands palms up. Pull hands apart and down toward floor, keeping elbows near sides. Hold momentarily. Slowly return to starting position. Repeat _10__ times. Band color __yellow____

## 2022-04-29 ENCOUNTER — Ambulatory Visit: Payer: Federal, State, Local not specified - PPO

## 2022-04-30 ENCOUNTER — Ambulatory Visit: Payer: Federal, State, Local not specified - PPO

## 2022-05-03 ENCOUNTER — Ambulatory Visit: Payer: Federal, State, Local not specified - PPO

## 2022-05-04 ENCOUNTER — Encounter: Payer: Self-pay | Admitting: Physical Therapy

## 2022-05-04 ENCOUNTER — Ambulatory Visit: Payer: Federal, State, Local not specified - PPO

## 2022-05-04 ENCOUNTER — Ambulatory Visit: Payer: Federal, State, Local not specified - PPO | Attending: Surgery | Admitting: Physical Therapy

## 2022-05-04 DIAGNOSIS — R293 Abnormal posture: Secondary | ICD-10-CM | POA: Diagnosis not present

## 2022-05-04 DIAGNOSIS — Z483 Aftercare following surgery for neoplasm: Secondary | ICD-10-CM | POA: Insufficient documentation

## 2022-05-04 DIAGNOSIS — C50312 Malignant neoplasm of lower-inner quadrant of left female breast: Secondary | ICD-10-CM | POA: Diagnosis not present

## 2022-05-04 DIAGNOSIS — R600 Localized edema: Secondary | ICD-10-CM | POA: Diagnosis not present

## 2022-05-04 DIAGNOSIS — Z170 Estrogen receptor positive status [ER+]: Secondary | ICD-10-CM | POA: Insufficient documentation

## 2022-05-04 DIAGNOSIS — M25612 Stiffness of left shoulder, not elsewhere classified: Secondary | ICD-10-CM | POA: Insufficient documentation

## 2022-05-04 NOTE — Therapy (Signed)
OUTPATIENT PHYSICAL THERAPY BREAST CANCER TREATMENT   Patient Name: Cheyenne [name] MRN: [id number] DOB:04/26/1981, 41 y.o., female Today's Date: 05/04/2022   PT End of Session - 05/04/22 0906     Visit Number 6    Number of Visits 10    Date for PT Re-Evaluation 05/17/22    PT Start Time 0905    PT Stop Time 0951    PT Time Calculation (min) 46 min    Activity Tolerance Patient tolerated treatment well    Behavior During Therapy [facility] for tasks assessed/performed             Past Medical History:  Diagnosis Date   Anal fissure    Anxiety    Breast cancer (HCC) 08/10/21   Date diagnosis was given   DVT (deep venous thrombosis) (HCC)    Incomplete right bundle branch block (RBBB) determined by electrocardiography 09/01/2021   Past Surgical History:  Procedure Laterality Date   BREAST LUMPECTOMY WITH RADIOACTIVE SEED AND SENTINEL LYMPH NODE BIOPSY Left 03/16/2022   Procedure: LEFT BREAST SEED LUMPECTOMY, LEFT SENTINEL LYMPH NODE MAPPING;  Surgeon: Cornett, Thomas, MD;  Location: [facility];  Service: General;  Laterality: Left;  GEN & PEC BLOCK   NO PAST SURGERIES     PORTACATH PLACEMENT N/A 09/08/2021   Procedure: INSERTION PORT-A-CATH;  Surgeon: Cornett, Thomas, MD;  [facility];  Service: General;  Laterality: N/A;   Patient Active Problem List   Diagnosis Date Noted   Port-A-Cath in place 09/16/2021   Genetic testing 08/31/2021   Malignant neoplasm of lower-inner quadrant of left breast in female, estrogen receptor positive (HCC) 08/17/2021   Anxiety 08/06/2019   Plantar fasciitis 08/06/2019   Fluid collection (edema) in the arms, legs, hands and feet 05/02/2018    PCP: Banks, Shannon R, MD   REFERRING PROVIDER: Cornett, Thomas, MD   REFERRING DIAG: Left breast cancer  THERAPY DIAG:  Stiffness of left shoulder, not elsewhere classified  Localized edema  Aftercare following surgery for neoplasm  Abnormal posture  Malignant neoplasm of  lower-inner quadrant of left breast in female, estrogen receptor positive (HCC)  Rationale for Evaluation and Treatment Rehabilitation  ONSET DATE: 08/07/2021  SUBJECTIVE:                                                                                                                                                                                           SUBJECTIVE STATEMENT: I will start radiation tomorrow.   PERTINENT HISTORY:  Patient was diagnosed on 08/07/2021 with left grade III invasive ductal carcinoma breast cancer. It measures 2.8 cm and is located in the   OUTPATIENT PHYSICAL THERAPY BREAST CANCER TREATMENT   Patient Name: Cheyenne [name] MRN: [id number] DOB:[birth year], 41 y.o., female Today's Date: 05/04/2022   PT End of Session - 05/04/22 0906     Visit Number 6    Number of Visits 10    Date for PT Re-Evaluation 05/17/22    PT Start Time 0905    PT Stop Time 0951    PT Time Calculation (min) 46 min    Activity Tolerance Patient tolerated treatment well    Behavior During Therapy [facility] for tasks assessed/performed             Past Medical History:  Diagnosis Date   Anal fissure    Anxiety    Breast cancer ([place]) 08/10/21   Date diagnosis was given   DVT (deep venous thrombosis) (HCC)    Incomplete right bundle branch block (RBBB) determined by electrocardiography 09/01/2021   Past Surgical History:  Procedure Laterality Date   BREAST LUMPECTOMY WITH RADIOACTIVE SEED AND SENTINEL LYMPH NODE BIOPSY Left 03/16/2022   Procedure: LEFT BREAST SEED LUMPECTOMY, LEFT SENTINEL [employer];  Surgeon: Erroll Luna, MD;  Location: [place];  Service: General;  Laterality: Left;  GEN & PEC BLOCK   NO PAST SURGERIES     PORTACATH PLACEMENT N/A 09/08/2021   Procedure: INSERTION PORT-A-CATH;  Surgeon: Erroll Luna, MD;  [facility];  Service: General;  Laterality: N/A;   Patient Active Problem List   Diagnosis Date Noted   Port-A-Cath in place 09/16/2021   Genetic testing 08/31/2021   Malignant neoplasm of lower-inner quadrant of left breast in female, estrogen receptor positive ([place]) 08/17/2021   Anxiety 08/06/2019   Plantar fasciitis 08/06/2019   Fluid collection (edema) in the arms, legs, hands and feet 05/02/2018    PCP: Billie Ruddy, MD   REFERRING PROVIDER: Erroll Luna, MD   REFERRING DIAG: Left breast cancer  THERAPY DIAG:  Stiffness of left shoulder, not elsewhere classified  Localized edema  Aftercare following surgery for neoplasm  Abnormal posture  Malignant neoplasm of  lower-inner quadrant of left breast in female, estrogen receptor positive ([place])  Rationale for Evaluation and Treatment Rehabilitation  ONSET DATE: 08/07/2021  SUBJECTIVE:                                                                                                                                                                                           SUBJECTIVE STATEMENT: I will start radiation tomorrow.   PERTINENT HISTORY:  Patient was diagnosed on 08/07/2021 with left grade III invasive ductal carcinoma breast cancer. It measures 2.8 cm and is located in the  OUTPATIENT PHYSICAL THERAPY BREAST CANCER TREATMENT   Patient Name: Cheyenne [name] MRN: [id number] DOB:[birth year], 41 y.o., female Today's Date: 05/04/2022   PT End of Session - 05/04/22 0906     Visit Number 6    Number of Visits 10    Date for PT Re-Evaluation 05/17/22    PT Start Time 0905    PT Stop Time 0951    PT Time Calculation (min) 46 min    Activity Tolerance Patient tolerated treatment well    Behavior During Therapy [facility] for tasks assessed/performed             Past Medical History:  Diagnosis Date   Anal fissure    Anxiety    Breast cancer ([place]) 08/10/21   Date diagnosis was given   DVT (deep venous thrombosis) (HCC)    Incomplete right bundle branch block (RBBB) determined by electrocardiography 09/01/2021   Past Surgical History:  Procedure Laterality Date   BREAST LUMPECTOMY WITH RADIOACTIVE SEED AND SENTINEL LYMPH NODE BIOPSY Left 03/16/2022   Procedure: LEFT BREAST SEED LUMPECTOMY, LEFT SENTINEL [employer];  Surgeon: Erroll Luna, MD;  Location: [place];  Service: General;  Laterality: Left;  GEN & PEC BLOCK   NO PAST SURGERIES     PORTACATH PLACEMENT N/A 09/08/2021   Procedure: INSERTION PORT-A-CATH;  Surgeon: Erroll Luna, MD;  [facility];  Service: General;  Laterality: N/A;   Patient Active Problem List   Diagnosis Date Noted   Port-A-Cath in place 09/16/2021   Genetic testing 08/31/2021   Malignant neoplasm of lower-inner quadrant of left breast in female, estrogen receptor positive ([place]) 08/17/2021   Anxiety 08/06/2019   Plantar fasciitis 08/06/2019   Fluid collection (edema) in the arms, legs, hands and feet 05/02/2018    PCP: Billie Ruddy, MD   REFERRING PROVIDER: Erroll Luna, MD   REFERRING DIAG: Left breast cancer  THERAPY DIAG:  Stiffness of left shoulder, not elsewhere classified  Localized edema  Aftercare following surgery for neoplasm  Abnormal posture  Malignant neoplasm of  lower-inner quadrant of left breast in female, estrogen receptor positive ([place])  Rationale for Evaluation and Treatment Rehabilitation  ONSET DATE: 08/07/2021  SUBJECTIVE:                                                                                                                                                                                           SUBJECTIVE STATEMENT: I will start radiation tomorrow.   PERTINENT HISTORY:  Patient was diagnosed on 08/07/2021 with left grade III invasive ductal carcinoma breast cancer. It measures 2.8 cm and is located in the  OUTPATIENT PHYSICAL THERAPY BREAST CANCER TREATMENT   Patient Name: Cheyenne [name] MRN: [id number] DOB:[birth year], 41 y.o., female Today's Date: 05/04/2022   PT End of Session - 05/04/22 0906     Visit Number 6    Number of Visits 10    Date for PT Re-Evaluation 05/17/22    PT Start Time 0905    PT Stop Time 0951    PT Time Calculation (min) 46 min    Activity Tolerance Patient tolerated treatment well    Behavior During Therapy [facility] for tasks assessed/performed             Past Medical History:  Diagnosis Date   Anal fissure    Anxiety    Breast cancer ([place]) 08/10/21   Date diagnosis was given   DVT (deep venous thrombosis) (HCC)    Incomplete right bundle branch block (RBBB) determined by electrocardiography 09/01/2021   Past Surgical History:  Procedure Laterality Date   BREAST LUMPECTOMY WITH RADIOACTIVE SEED AND SENTINEL LYMPH NODE BIOPSY Left 03/16/2022   Procedure: LEFT BREAST SEED LUMPECTOMY, LEFT SENTINEL [employer];  Surgeon: Erroll Luna, MD;  Location: [place];  Service: General;  Laterality: Left;  GEN & PEC BLOCK   NO PAST SURGERIES     PORTACATH PLACEMENT N/A 09/08/2021   Procedure: INSERTION PORT-A-CATH;  Surgeon: Erroll Luna, MD;  [facility];  Service: General;  Laterality: N/A;   Patient Active Problem List   Diagnosis Date Noted   Port-A-Cath in place 09/16/2021   Genetic testing 08/31/2021   Malignant neoplasm of lower-inner quadrant of left breast in female, estrogen receptor positive ([place]) 08/17/2021   Anxiety 08/06/2019   Plantar fasciitis 08/06/2019   Fluid collection (edema) in the arms, legs, hands and feet 05/02/2018    PCP: Billie Ruddy, MD   REFERRING PROVIDER: Erroll Luna, MD   REFERRING DIAG: Left breast cancer  THERAPY DIAG:  Stiffness of left shoulder, not elsewhere classified  Localized edema  Aftercare following surgery for neoplasm  Abnormal posture  Malignant neoplasm of  lower-inner quadrant of left breast in female, estrogen receptor positive ([place])  Rationale for Evaluation and Treatment Rehabilitation  ONSET DATE: 08/07/2021  SUBJECTIVE:                                                                                                                                                                                           SUBJECTIVE STATEMENT: I will start radiation tomorrow.   PERTINENT HISTORY:  Patient was diagnosed on 08/07/2021 with left grade III invasive ductal carcinoma breast cancer. It measures 2.8 cm and is located in the

## 2022-05-05 ENCOUNTER — Encounter: Payer: Self-pay | Admitting: Physical Therapy

## 2022-05-05 ENCOUNTER — Ambulatory Visit
Admission: RE | Admit: 2022-05-05 | Discharge: 2022-05-05 | Disposition: A | Discharge status: Home / Self Care | Payer: Federal, State, Local not specified - PPO | Source: Ambulatory Visit | Attending: Radiation Oncology | Admitting: Radiation Oncology

## 2022-05-05 ENCOUNTER — Ambulatory Visit: Payer: Federal, State, Local not specified - PPO | Admitting: Physical Therapy

## 2022-05-05 ENCOUNTER — Other Ambulatory Visit: Payer: Self-pay

## 2022-05-05 DIAGNOSIS — Z170 Estrogen receptor positive status [ER+]: Secondary | ICD-10-CM | POA: Insufficient documentation

## 2022-05-05 DIAGNOSIS — R293 Abnormal posture: Secondary | ICD-10-CM

## 2022-05-05 DIAGNOSIS — C50312 Malignant neoplasm of lower-inner quadrant of left female breast: Secondary | ICD-10-CM | POA: Insufficient documentation

## 2022-05-05 DIAGNOSIS — Z483 Aftercare following surgery for neoplasm: Secondary | ICD-10-CM

## 2022-05-05 DIAGNOSIS — Z79899 Other long term (current) drug therapy: Secondary | ICD-10-CM | POA: Insufficient documentation

## 2022-05-05 DIAGNOSIS — R600 Localized edema: Secondary | ICD-10-CM | POA: Diagnosis not present

## 2022-05-05 DIAGNOSIS — Z5112 Encounter for antineoplastic immunotherapy: Secondary | ICD-10-CM | POA: Diagnosis not present

## 2022-05-05 DIAGNOSIS — M25612 Stiffness of left shoulder, not elsewhere classified: Secondary | ICD-10-CM | POA: Diagnosis not present

## 2022-05-05 DIAGNOSIS — Z510 Encounter for antineoplastic radiation therapy: Secondary | ICD-10-CM | POA: Diagnosis not present

## 2022-05-05 LAB — RAD ONC ARIA SESSION SUMMARY
Course Elapsed Days: 0
Plan Fractions Treated to Date: 1
Plan Prescribed Dose Per Fraction: 1.8 Gy
Plan Total Fractions Prescribed: 28
Plan Total Prescribed Dose: 50.4 Gy
Reference Point Dosage Given to Date: 1.8 Gy
Reference Point Session Dosage Given: 1.8 Gy
Session Number: 1

## 2022-05-05 NOTE — Therapy (Signed)
OUTPATIENT PHYSICAL THERAPY BREAST CANCER TREATMENT   Patient Name: Cheyenne [name] MRN: [id number] DOB:May 18, 1981, 41 y.o., female Today's Date: 05/05/2022   PT End of Session - 05/05/22 1224     Visit Number 7    Number of Visits 10    Date for PT Re-Evaluation 05/17/22    PT Start Time 1204    PT Stop Time 1220    PT Time Calculation (min) 16 min    Activity Tolerance Patient tolerated treatment well    Behavior During Therapy [facility] for tasks assessed/performed             Past Medical History:  Diagnosis Date   Anal fissure    Anxiety    Breast cancer ([place]) 08/10/21   Date diagnosis was given   DVT (deep venous thrombosis) ([place])    Incomplete right bundle branch block (RBBB) determined by electrocardiography 09/01/2021   Past Surgical History:  Procedure Laterality Date   BREAST LUMPECTOMY WITH RADIOACTIVE SEED AND SENTINEL LYMPH NODE BIOPSY Left 03/16/2022   Procedure: LEFT BREAST SEED LUMPECTOMY, LEFT SENTINEL [place];  Surgeon: Erroll Luna, MD;  Location: [facility];  Service: General;  Laterality: Left;  GEN & PEC BLOCK   NO PAST SURGERIES     PORTACATH PLACEMENT N/A 09/08/2021   Procedure: INSERTION PORT-A-CATH;  Surgeon: Erroll Luna, MD;  Location: [facility];  Service: General;  Laterality: N/A;   Patient Active Problem List   Diagnosis Date Noted   Port-A-Cath in place 09/16/2021   Genetic testing 08/31/2021   Malignant neoplasm of lower-inner quadrant of left breast in female, estrogen receptor positive ([place]) 08/17/2021   Anxiety 08/06/2019   Plantar fasciitis 08/06/2019   Fluid collection (edema) in the arms, legs, hands and feet 05/02/2018    PCP: Billie Ruddy, MD   REFERRING PROVIDER: Erroll Luna, MD   REFERRING DIAG: Left breast cancer  THERAPY DIAG:  Stiffness of left shoulder, not elsewhere classified  Localized edema  Aftercare following surgery for neoplasm  Abnormal posture  Malignant neoplasm of  lower-inner quadrant of left breast in female, estrogen receptor positive ([place])  Rationale for Evaluation and Treatment Rehabilitation  ONSET DATE: 08/07/2021  SUBJECTIVE:                                                                                                                                                                                           SUBJECTIVE STATEMENT: I had radiation already today.   PERTINENT HISTORY:  Patient was diagnosed on 08/07/2021 with left grade III invasive ductal carcinoma breast cancer. It measures 2.8 cm and is located in the  lower inner quadrant. It is weakly (2%) ER positive, PR and HER2 negative with a Ki67 of 40%. 03/16/22- L breast seed lumpectomy and SLNB 0/2. Had post op seroma with drainage from inframammary incision as well as an additional one inferior to axillary incision.   PATIENT GOALS:  Reassess how my recovery is going related to arm function, pain, and swelling.  PAIN:  Are you having pain? Yes: NPRS scale: 0/10 Pain location: inferior breast Pain description: soreness Aggravating factors: nothing Relieving factors: nothing  PRECAUTIONS: Recent Surgery, left UE Lymphedema risk,   ACTIVITY LEVEL / LEISURE: pt was walking prior to surgery - she walked 30 miles in a month prior to her surgery   OBJECTIVE:   PATIENT SURVEYS:  QUICK DASH:     OBSERVATIONS:  Incision in inferior L breast still open and draining but healing. Pt had this area covered with gauze  POSTURE:  Forward head and rounded shoulders posture   UPPER EXTREMITY AROM/PROM:   A/PROM Right 08/19/2021 Left 08/19/2021 Left 04/19/22 Left 04/28/22 Left 05/04/22  Shoulder extension 40 22 62 72 65  Shoulder flexion 146 146 152 167 166  Shoulder abduction 153 151 123 160 171  Shoulder internal rotation 57 52 59 70 65  Shoulder external rotation 90 90 85 90 87                          (Blank rows = not tested)       CERVICAL AROM: All within normal limits    UPPER EXTREMITY STRENGTH: WNL     LYMPHEDEMA ASSESSMENTS:    LANDMARK RIGHT 08/19/2021 LEFT 08/19/2021 RIGHT 04/19/22 LEFT 04/19/22  10 cm proximal to olecranon process 37 38.6 33.9 36  Olecranon process 29.2 30.3 28.5 29.2  10 cm proximal to ulnar styloid process 25.7 26.8 24.7 23.2  Just proximal to ulnar styloid process 17.5 18.7 17.8 16.7  Across hand at thumb web space 19.8 20 20.5 19.8  At base of 2nd digit 6.3 6.3 6.2 6.1  (Blank rows = not tested)       Surgery type/Date: 03/16/22- L breast lumpectomy and SLNB Number of lymph nodes removed: 0/2 Current/past treatment (chemo, radiation, hormone therapy): completed neoadjuvant chemo, will begin radiation on 04/28/22 Other symptoms:  Heaviness/tightness No Pain Yes in the L breast Pitting edema No Infections No Decreased scar mobility Yes Stemmer sign No   PATIENT EDUCATION:  Education details: post op exercises, importance of stretching, need for compression for seroma Person educated: Patient and Spouse Education method: Explanation Education comprehension: verbalized understanding   TODAY'S TREATMENT:  05/05/22- Measured pt for prophylactic compression garments and issued info for pt to obtain pending insurance coverage. She did not fit in to [employer], [employer], [employer] but did fit in to [place] Strong sleeve size 7 and gauntlet size 4.   05/04/22- Pulleys in to flexion and abduction x 2 min each. Ball up wall x 10 reps in direction of flexion and 10 reps in direction of abduction.  PROM to L shoulder in to abduction, flexion and ER with prolonged holds while performing STM to L pec and MFR to L axilla. Pt's wound under L breast looks much better. Only very lateral edge has a small opening. The rest has recently closed.   04/28/22- Pulleys in to flexion and abduction x 2 min each. Ball up wall x 10 reps in direction of flexion and 10 reps in direction of abduction. Instructed pt  in supine scapular strengthening  exercises with yellow band and issued these as part of pt's home exercise program (pt return demonstrated 10 reps of each): flexion (narrow and wide grip), ER, horizontal abduction, and diagonals bilaterally. PROM to L shoulder in to abduction, flexion and scaption with prolonged holds while performing STM to L pec and MFR to L axilla. STM performed to L pec and gently to inferior breast where pt had increased tenderness. Wound is healing well today with less drainage noted.   04/26/22- Pulleys in to flexion and abduction x 2 min each. Ball up wall x 10 reps in direction of flexion and 10 reps in direction of abduction. Pt had increased soreness near seroma where wound is healing so avoided this area today. No increase redness noted. Wound looked a beefy red at lateral edge but healing well. PROM to L shoulder in to abduction, flexion and ER with prolonged holds while performing STM to L pec and MFR to L axilla.   04/21/22- Instructed pt in supine dowel abduction and flexion x 10 reps each with 5 sec holds, PROM to L shoulder in direction of abduction and flexion with pt able to achieve full ROM by end of session. MLD to L breast in area of seroma in L axilla and inferior breast (being careful of healing wound) as follows in supine: short neck, 5 diaphragmatic breaths, R axillary nodes and establishment of interaxillary pathway, L inguinal nodes and establishment of axillo inguinal pathway then L breast in area of seroma moving fluid towards pathways.  HOME EXERCISE PROGRAM:  Reviewed previously given post op HEP.   ASSESSMENT:  CLINICAL IMPRESSION: Pt was measured for prophylactic compression garments. She only fit in to the off the shelf [place] strong sleeve and gauntlet. Her benefits were faxed to [facility] yesterday and therapist has not received them back yet. Will email pt at [email or web address] once benefits come back to see how pt would like to proceed with ordering prophylactic garments.    Pt will benefit from skilled therapeutic intervention to improve on the following deficits: Decreased knowledge of precautions, impaired UE functional use, pain, decreased ROM, postural dysfunction.   PT treatment/interventions: ADL/Self care home management, Therapeutic exercises, Therapeutic activity, Patient/Family education, Self Care, Joint mobilization, Orthotic/Fit training, Manual lymph drainage, scar mobilization, Vasopneumatic device, Manual therapy, and Re-evaluation     GOALS: Goals reviewed with patient? Yes  LONG TERM GOALS:  (STG=LTG)  GOALS Name Target Date  Goal status  1 Pt will demonstrate she has regained full shoulder ROM and function post operatively compared to baselines.  Baseline: 05/17/2022 INITIAL  2 Pt will demonstrate 150 degrees of L shoulder abduction to allow her to reach out to the side.  05/17/2022 INITIAL  3 Pt will report independence with scar mobilization once her wound has completely healed.  05/17/2022 INITIAL  4 Pt will be independent in a home exercise program for continued stretching and strengthening.  05/17/2022 INITIAL     PLAN: PT FREQUENCY/DURATION: 2x/wk for 4 wks  PLAN FOR NEXT SESSION: reassess after radiation   [facility]  [address], [address]  [place] [address]  [phone]  After Breast Cancer Class It is recommended you attend the ABC class to be educated on lymphedema risk reduction. This class is free of charge and lasts for 1 hour. It is a 1-time class. You will need to download the Webex app either on your phone or computer. We will send you a link the night  before or the morning of the class. You should be able to click on that link to join the class. This is not a confidential class. You don't have to turn your camera on, but other participants may be able to see your email address.  Scar massage You can begin gentle scar massage to you incision sites. Gently place one hand on the  incision and move the skin (without sliding on the skin) in various directions. Do this for a few minutes and then you can gently massage either coconut oil or vitamin E cream into the scars.  Compression garment You should continue wearing your compression bra until you feel like you no longer have swelling.  Home exercise Program Continue doing the exercises you were given until you feel like you can do them without feeling any tightness at the end.   Walking Program Studies show that 30 minutes of walking per day (fast enough to elevate your heart rate) can significantly reduce the risk of a cancer recurrence. If you can't walk due to other medical reasons, we encourage you to find another activity you could do (like a stationary bike or water exercise).  Posture After breast cancer surgery, people frequently sit with rounded shoulders posture because it puts their incisions on slack and feels better. If you sit like this and scar tissue forms in that position, you can become very tight and have pain sitting or standing with good posture. Try to be aware of your posture and sit and stand up tall to heal properly.  Follow up PT: It is recommended you return every 3 months for the first 3 years following surgery to be assessed on the SOZO machine for an L-Dex score. This helps prevent clinically significant lymphedema in 95% of patients. These follow up screens are 10 minute appointments that you are not billed for.  [facility], PT 05/05/2022, 12:29 PM

## 2022-05-06 ENCOUNTER — Other Ambulatory Visit: Payer: Self-pay

## 2022-05-06 ENCOUNTER — Inpatient Hospital Stay (HOSPITAL_BASED_OUTPATIENT_CLINIC_OR_DEPARTMENT_OTHER): Payer: Federal, State, Local not specified - PPO | Admitting: Hematology

## 2022-05-06 ENCOUNTER — Encounter: Payer: Self-pay | Admitting: Physical Therapy

## 2022-05-06 ENCOUNTER — Encounter: Payer: Self-pay | Admitting: Hematology

## 2022-05-06 ENCOUNTER — Inpatient Hospital Stay: Payer: Federal, State, Local not specified - PPO

## 2022-05-06 ENCOUNTER — Other Ambulatory Visit: Payer: Self-pay | Admitting: Hematology

## 2022-05-06 ENCOUNTER — Inpatient Hospital Stay: Payer: Federal, State, Local not specified - PPO | Attending: Hematology

## 2022-05-06 ENCOUNTER — Ambulatory Visit
Admission: RE | Admit: 2022-05-06 | Discharge: 2022-05-06 | Disposition: A | Discharge status: Home / Self Care | Payer: Federal, State, Local not specified - PPO | Source: Ambulatory Visit | Attending: Radiation Oncology | Admitting: Radiation Oncology

## 2022-05-06 ENCOUNTER — Encounter: Payer: Self-pay | Admitting: *Deleted

## 2022-05-06 VITALS — BP 117/80 | HR 81 | Temp 98.2°F | Resp 17 | Ht 62.0 in | Wt 187.9 lb

## 2022-05-06 DIAGNOSIS — Z5112 Encounter for antineoplastic immunotherapy: Secondary | ICD-10-CM | POA: Diagnosis not present

## 2022-05-06 DIAGNOSIS — Z170 Estrogen receptor positive status [ER+]: Secondary | ICD-10-CM | POA: Diagnosis not present

## 2022-05-06 DIAGNOSIS — C50312 Malignant neoplasm of lower-inner quadrant of left female breast: Secondary | ICD-10-CM

## 2022-05-06 DIAGNOSIS — Z510 Encounter for antineoplastic radiation therapy: Secondary | ICD-10-CM | POA: Diagnosis not present

## 2022-05-06 DIAGNOSIS — Z95828 Presence of other vascular implants and grafts: Secondary | ICD-10-CM

## 2022-05-06 DIAGNOSIS — Z79899 Other long term (current) drug therapy: Secondary | ICD-10-CM | POA: Insufficient documentation

## 2022-05-06 LAB — T4, FREE: Free T4: 0.84 ng/dL (ref 0.61–1.12)

## 2022-05-06 LAB — CMP (CANCER CENTER ONLY)
ALT: 27 U/L (ref 0–44)
AST: 24 U/L (ref 15–41)
Albumin: 4.1 g/dL (ref 3.5–5.0)
Alkaline Phosphatase: 82 U/L (ref 38–126)
Anion gap: 7 (ref 5–15)
BUN: 18 mg/dL (ref 6–20)
CO2: 28 mmol/L (ref 22–32)
Calcium: 9.5 mg/dL (ref 8.9–10.3)
Chloride: 103 mmol/L (ref 98–111)
Creatinine: 0.79 mg/dL (ref 0.44–1.00)
GFR, Estimated: >60 mL/min (ref >60)
Glucose, Bld: 111 mg/dL — ABNORMAL HIGH (ref 70–99)
Potassium: 3.9 mmol/L (ref 3.5–5.1)
Sodium: 138 mmol/L (ref 135–145)
Total Bilirubin: 0.5 mg/dL (ref 0.3–1.2)
Total Protein: 7.7 g/dL (ref 6.5–8.1)

## 2022-05-06 LAB — CBC WITH DIFFERENTIAL (CANCER CENTER ONLY)
Abs Immature Granulocytes: 0.02 10*3/uL (ref 0.00–0.07)
Basophils Absolute: 0 10*3/uL (ref 0.0–0.1)
Basophils Relative: 1 %
Eosinophils Absolute: 0.2 10*3/uL (ref 0.0–0.5)
Eosinophils Relative: 3 %
HCT: 32.9 % — ABNORMAL LOW (ref 36.0–46.0)
Hemoglobin: 11 g/dL — ABNORMAL LOW (ref 12.0–15.0)
Immature Granulocytes: 0 %
Lymphocytes Relative: 32 %
Lymphs Abs: 2 10*3/uL (ref 0.7–4.0)
MCH: 31.6 pg (ref 26.0–34.0)
MCHC: 33.4 g/dL (ref 30.0–36.0)
MCV: 94.5 fL (ref 80.0–100.0)
Monocytes Absolute: 0.6 10*3/uL (ref 0.1–1.0)
Monocytes Relative: 9 %
Neutro Abs: 3.4 10*3/uL (ref 1.7–7.7)
Neutrophils Relative %: 55 %
Platelet Count: 273 10*3/uL (ref 150–400)
RBC: 3.48 MIL/uL — ABNORMAL LOW (ref 3.87–5.11)
RDW: 12.9 % (ref 11.5–15.5)
WBC Count: 6.2 10*3/uL (ref 4.0–10.5)
nRBC: 0 % (ref 0.0–0.2)

## 2022-05-06 LAB — RAD ONC ARIA SESSION SUMMARY
Course Elapsed Days: 1
Plan Fractions Treated to Date: 2
Plan Prescribed Dose Per Fraction: 1.8 Gy
Plan Total Fractions Prescribed: 28
Plan Total Prescribed Dose: 50.4 Gy
Reference Point Dosage Given to Date: 3.6 Gy
Reference Point Session Dosage Given: 1.8 Gy
Session Number: 2

## 2022-05-06 LAB — TSH: TSH: 2.444 u[IU]/mL (ref 0.350–4.500)

## 2022-05-06 MED ORDER — SODIUM CHLORIDE 0.9% FLUSH
10.0000 mL | INTRAVENOUS | Status: DC | PRN
  Administered 2022-05-06: 10 mL

## 2022-05-06 MED ORDER — SODIUM CHLORIDE 0.9 % IV SOLN
200.0000 mg | Freq: Once | INTRAVENOUS | Status: AC
  Administered 2022-05-06: 200 mg via INTRAVENOUS
  Filled 2022-05-06: qty 200

## 2022-05-06 MED ORDER — HEPARIN SOD (PORK) LOCK FLUSH 100 UNIT/ML IV SOLN
500.0000 [IU] | Freq: Once | INTRAVENOUS | Status: AC | PRN
  Administered 2022-05-06: 500 [IU]

## 2022-05-06 MED ORDER — SODIUM CHLORIDE 0.9 % IV SOLN: Freq: Once | INTRAVENOUS | Status: AC

## 2022-05-06 MED ORDER — SODIUM CHLORIDE 0.9% FLUSH
10.0000 mL | Freq: Once | INTRAVENOUS | Status: AC
  Administered 2022-05-06: 10 mL

## 2022-05-06 NOTE — Patient Instructions (Signed)
[facility]ICAL [facility]COLOG[facility]   Discharge Instructions: Thank you for choosing [facility] to provide your oncology and hematology care.   If you have a lab appointment with the Cancer Center, please go directly to the Cancer Center and check in at the registration area.   Wear comfortable clothing and clothing appropriate for easy access to any Portacath or PICC line.   We strive to give you quality time with your provider. You may need to reschedule your appointment if you arrive late (15 or more minutes).  Arriving late affects you and other patients whose appointments are after yours.  Also, if you miss three or more appointments without notifying the office, you may be dismissed from the clinic at the provider's discretion.      For prescription refill requests, have your pharmacy contact our office and allow 72 hours for refills to be completed.    Today you received the following chemotherapy and/or immunotherapy agents: pembrolizumab      To help prevent nausea and vomiting after your treatment, we encourage you to take your nausea medication as directed.  BELOW ARE SYMPTOMS THAT SHOULD BE REPORTED IMMEDIATELY: *FEVER GREATER THAN 100.4 F (38 C) OR HIGHER *CHILLS OR SWEATING *NAUSEA AND VOMITING THAT IS NOT CONTROLLED WITH YOUR NAUSEA MEDICATION *UNUSUAL SHORTNESS OF BREATH *UNUSUAL BRUISING OR BLEEDING *URINARY PROBLEMS (pain or burning when urinating, or frequent urination) *BOWEL PROBLEMS (unusual diarrhea, constipation, pain near the anus) TENDERNESS IN MOUTH AND THROAT WITH OR WITHOUT PRESENCE OF ULCERS (sore throat, sores in mouth, or a toothache) UNUSUAL RASH, SWELLING OR PAIN  UNUSUAL VAGINAL DISCHARGE OR ITCHING   Items with * indicate a potential emergency and should be followed up as soon as possible or go to the Emergency Department if any problems should occur.  Please show the CHEMOTHERAPY ALERT CARD or IMMUNOTHERAPY ALERT CARD at  check-in to the Emergency Department and triage nurse.  Should you have questions after your visit or need to cancel or reschedule your appointment, please contact [facility]  Dept: [phone]  and follow the prompts.  Office hours are 8:00 a.m. to 4:30 p.m. Monday - Friday. Please note that voicemails left after 4:00 p.m. may not be returned until the following business day.  We are closed weekends and major holidays. You have access to a nurse at all times for urgent questions. Please call the main number to the clinic Dept: [phone] and follow the prompts.   For any non-urgent questions, you may also contact your provider using MyChart. We now offer e-Visits for anyone 18 and older to request care online for non-urgent symptoms. For details visit [email or web address].   Also download the MyChart app! Go to the app store, search "MyChart", open the app, select [facility], and log in with your MyChart username and password.  Masks are optional in the cancer centers. If you would like for your care team to wear a mask while they are taking care of you, please let them know. You may have one support person who is at least 40 years old accompany you for your appointments. 

## 2022-05-06 NOTE — Progress Notes (Signed)
[place]   [phone]) [phone] Fax:(336) [phone]   Clinic Follow up Note   Patient Care Team: Cheyenne Ruddy, MD as PCP - General (Family Medicine) Cheyenne Luna, MD as Consulting Physician (General Surgery) Cheyenne Merle, MD as Consulting Physician (Hematology) Cheyenne Pray, MD as Consulting Physician (Radiation Oncology)  Date of Service:  05/06/2022  CHIEF COMPLAINT: f/u of left breast cancer  CURRENT THERAPY:  [name], starting 09/09/21 -adjuvant radiation, 05/05/22 - 06/18/22  ASSESSMENT & PLAN:  Cheyenne [name] is a 41 y.o. pre-menopausal female with   1. Malignant neoplasm of lower-inner quadrant of left breast, Stage IIIA, c(T2, N1), Functionally triple negative, Grade 3, ypT0N0  -presented with palpable left breast mass. B/l MM and left [place] on 08/07/21 showed: 2.8 cm left breast mass at 7:30; at least 4 abnormal lymph nodes. Biopsy that day confirmed IDC in breast but node was negative. -breast MRI on 08/26/21 and PET on 08/31/21 were negative aside from primary mass.  -port placed 09/08/21 -s/p neoadjuvant carbo/taxol and keytruda on 09/09/21 - 11/25/21; her breast mass became no longer palpable after cycle 3. She then completed Epirubicin/cytoxan 12/02/21 - 02/03/22 (she had allergic reaction to Adriamycin), tolerated moderately well overall. -left lumpectomy 03/16/22 with Dr. Brantley Stage showed no residual carcinoma, indicating complete response. -she will continue Keytruda every 3 weeks to complete a year; plan for final dose on 08/19/22. -she is currently receiving radiation under Dr. Sondra [name], scheduled to finish on 06/18/22. -we reviewed antiestrogen therapy today. Given her ER was 2% weakly positive on her initial biopsy, I do not feel she would benefit much from antiestrogen therapy. -she is recovering well from chemo and surgery. She reports some hot flashes and mild joint pain, both of which are tolerable or manageable at this time. Labs reviewed, hgb  improved to 11, overall stable and adequate for Keytruda today.   2. Anemia -secondary to chemo; she has required blood transfusion several times throughout treatment -slowly improving off chemo, hg 11 today (05/06/22)     PLAN:  -proceed with Keytruda today -continue daily radiation -lab, flush, f/u, and Keytruda every 3 weeks   No problem-specific Assessment & Plan notes found for this encounter.   SUMMARY OF ONCOLOGIC HISTORY: Oncology History Overview Note   Cancer Staging  Malignant neoplasm of lower-inner quadrant of left breast in female, estrogen receptor positive ([place]) Staging form: Breast, AJCC 8th Edition - Clinical stage from 08/07/2021: Stage IIIA (cT2, cN1, cM0, G3, ER+, PR-, HER2-) - Signed by Cheyenne Merle, MD on 08/18/2021    Malignant neoplasm of lower-inner quadrant of left breast in female, estrogen receptor positive ([place])  08/07/2021 Cancer Staging   Staging form: Breast, AJCC 8th Edition - Clinical stage from 08/07/2021: Stage IIB (cT2, cN0, cM0, G3, ER-, PR-, HER2-) - Signed by Cheyenne Merle, MD on 09/24/2021 Stage prefix: Initial diagnosis Histologic grading system: 3 grade system   08/07/2021 Mammogram   CLINICAL DATA:  Palpable mass in the LEFT breast since August.   EXAM: DIGITAL DIAGNOSTIC BILATERAL MAMMOGRAM WITH TOMOSYNTHESIS AND CAD; ULTRASOUND LEFT BREAST LIMITED  IMPRESSION: 1. Suspicious mass in the 7:30 o'clock location of the LEFT breast for which biopsy is recommended. 2. LEFT axillary adenopathy, with at least 4 abnormal lymph nodes.   08/07/2021 Initial Biopsy   Diagnosis 1. Breast, left, needle core biopsy, 7:30, ribbon clip - INVASIVE DUCTAL CARCINOMA - SEE COMMENT 2. Lymph node, needle/core biopsy, left axilla, coil clip - NO CARCINOMA IDENTIFIED Microscopic Comment 1. based on  the biopsy, the carcinoma appears Nottingham grade 3 of 3 and measures 1.1 cm in greatest linear extent.  1. PROGNOSTIC INDICATORS Results: The tumor cells are  EQUIVOCAL for Her2 (2+). Her2 by FISH will be performed and results reported separately. Estrogen Receptor: 2%, POSITIVE, WEAK STAINING INTENSITY Progesterone Receptor: <1%, NEGATIVE Proliferation Marker Ki67: 40%  1. FLUORESCENCE IN-SITU HYBRIDIZATION Results: GROUP 5: HER2 **NEGATIVE**   08/17/2021 Initial Diagnosis   Malignant neoplasm of lower-inner quadrant of left breast in female, estrogen receptor positive ([place])   08/19/2021 Genetic Testing   [facility] CancerNext-Expanded Panel is Negative. Report date is 09/02/2021.  The CancerNext-Expanded gene panel offered by [facility] and includes sequencing, rearrangement, and RNA analysis for the following 77 genes: AIP, ALK, APC, ATM, AXIN2, BAP1, BARD1, BLM, BMPR1A, BRCA1, BRCA2, BRIP1, CDC73, CDH1, CDK4, CDKN1B, CDKN2A, CHEK2, CTNNA1, DICER1, FANCC, FH, FLCN, GALNT12, KIF1B, LZTR1, MAX, MEN1, MET, MLH1, MSH2, MSH3, MSH6, MUTYH, NBN, NF1, NF2, NTHL1, PALB2, PHOX2B, PMS2, POT1, PRKAR1A, PTCH1, PTEN, RAD51C, RAD51D, RB1, RECQL, RET, SDHA, SDHAF2, SDHB, SDHC, SDHD, SMAD4, SMARCA4, SMARCB1, SMARCE1, STK11, SUFU, TMEM127, TP53, TSC1, TSC2, VHL and XRCC2 (sequencing and deletion/duplication); EGFR, EGLN1, HOXB13, KIT, MITF, PDGFRA, POLD1, and POLE (sequencing only); EPCAM and GREM1 (deletion/duplication only).    08/26/2021 Imaging   EXAM: BILATERAL BREAST MRI WITH AND WITHOUT CONTRAST  IMPRESSION: 2.3 cm mass in the lower-inner quadrant of the left breast corresponding with the biopsy proven invasive ductal carcinoma.   08/31/2021 PET scan   IMPRESSION: 1. Known mass medially in the left breast is markedly hypermetabolic consistent with breast cancer. 2. No evidence of nodal metastases within the chest. No typical distant metastases. 3. Suspected acute inflammatory process involving central mesentery and pelvis with ill-defined focal hypermetabolic activity anterior to the sacrum. There are pelvic inflammatory changes and free pelvic fluid.  Recent labs demonstrate mild leukocytosis. Differential considerations include acute appendicitis, enteritis and small perforated foreign body. No evidence of drainable fluid collection, pneumoperitoneum or bowel obstruction. 4. These results were called by telephone at the time of interpretation on 08/31/2021 at 5:12 pm to on-call oncology nurse, [name], who verbally acknowledged these results. Unless there is a good clinical explanation for these findings, evaluation in the emergency department is likely warranted, and patient may benefit from abdominopelvic CT with oral and intravenous contrast.   09/01/2021 Imaging   EXAM: CT ABDOMEN AND PELVIS WITH CONTRAST  IMPRESSION: Multiple thick-walled loops of distal small bowel, suggesting infectious/inflammatory enteritis. Associated small volume pelvic ascites. No pneumatosis or free air.   No evidence of bowel obstruction.  Normal appendix.   2.1 cm lesion in the medial left breast, likely corresponding to the patient's known primary breast neoplasm. No findings suspicious for metastatic disease.   09/09/2021 - 03/25/2022 Chemotherapy   Patient is on Treatment Plan : BREAST Pembrolizumab (200) D1 + Carboplatin (5) D1 + Paclitaxel (80) D1,8,15 q21d X 4 cycles / Pembrolizumab (200) D1 + AC D1 q21d x 4 cycles     09/09/2021 -  Chemotherapy   Patient is on Treatment Plan : BREAST Pembrolizumab (200) D1 + Carboplatin (1.5) D1,8,15 + Paclitaxel (80) D1,8,15 q21d X 4 cycles / Pembrolizumab (200) D1 + AC D1 q21d x 4 cycles     03/16/2022 Definitive Surgery   FINAL MICROSCOPIC DIAGNOSIS:   A. LEFT BREAST, LUMPECTOMY:  Negative for residual carcinoma (ypT0)  Changes consistent with neoadjuvant therapy  Changes consistent with prior biopsy   B. LEFT AXILLARY SENTINEL LYMPH NODE, EXCISION:  One benign lymph node,  negative for carcinoma (0/1)   C. LEFT AXILLARY SENTINEL LYMPH NODE, EXCISION:  One benign lymph node, negative for carcinoma (0/1)     03/16/2022 Cancer Staging   Staging form: Breast, AJCC 8th Edition - Pathologic stage from 03/16/2022: No Stage Recommended (ypT0, pN0, cM0) - Signed by Cheyenne Merle, MD on 05/06/2022 Stage prefix: Post-therapy Response to neoadjuvant therapy: Complete response Residual tumor (R): R0 - None      INTERVAL HISTORY:  Cheyenne [name] is here for a follow up of breast cancer. She was last seen by me on 03/25/22. She presents to the clinic accompanied by her wife. She reports she is doing well overall. She reports she is experiencing hot flashes, denies sleep disturbances. She also notes some joint pain, mainly to her elbows and knees, rated around 4/10; she feels this is happening more often lately. She reports her energy is at about 85% of her normal.   All other systems were reviewed with the patient and are negative.  MEDICAL HISTORY:  Past Medical History:  Diagnosis Date   Anal fissure    Anxiety    Breast cancer ([place]) 08/10/21   Date diagnosis was given   DVT (deep venous thrombosis) ([place])    Incomplete right bundle branch block (RBBB) determined by electrocardiography 09/01/2021    SURGICAL HISTORY: Past Surgical History:  Procedure Laterality Date   BREAST LUMPECTOMY WITH RADIOACTIVE SEED AND SENTINEL LYMPH NODE BIOPSY Left 03/16/2022   Procedure: LEFT BREAST SEED LUMPECTOMY, LEFT SENTINEL [place];  Surgeon: Cheyenne Luna, MD;  Location: [facility];  Service: General;  Laterality: Left;  GEN & PEC BLOCK   NO PAST SURGERIES     PORTACATH PLACEMENT N/A 09/08/2021   Procedure: INSERTION PORT-A-CATH;  Surgeon: Cheyenne Luna, MD;  Location: [facility];  Service: General;  Laterality: N/A;    I have reviewed the social history and family history with the patient and they are unchanged from previous note.  ALLERGIES:  has No Known Allergies.  MEDICATIONS:  Current Outpatient Medications  Medication Sig Dispense Refill   acetaminophen (TYLENOL) 500 MG tablet  Take 500-1,000 mg by mouth every 6 (six) hours as needed (pain.). (Patient not taking: Reported on 04/14/2022)     albuterol (VENTOLIN HFA) 108 (90 Base) MCG/ACT inhaler Inhale 2 puffs into the lungs every 4 (four) hours as needed for wheezing. 1 each 5   ALPRAZolam (XANAX) 0.5 MG tablet Take one tablet twice daily as needed for anxiety. 45 tablet 2   cyclobenzaprine (FLEXERIL) 10 MG tablet Take 10 mg by mouth daily as needed for muscle spasms. (Patient not taking: Reported on 04/14/2022)     desonide (DESOWEN) 0.05 % cream Apply topically 2 (two) times daily. (Patient taking differently: Apply 1 application  topically 2 (two) times daily as needed (skin irritation.).) 30 g 1   desonide (DESOWEN) 0.05 % lotion Apply 1 application. topically daily as needed (acne).     EPINEPHrine 0.3 mg/0.3 mL IJ SOAJ injection Inject 0.3 mg into the muscle as needed for anaphylaxis. 1 each 0   hydroquinone 4 % cream APPLY TO DARK SPOTS AT BEDTIME 28.35 g 0   ibuprofen (ADVIL) 800 MG tablet Take 1 tablet (800 mg total) by mouth every 8 (eight) hours as needed. 30 tablet 0   ibuprofen (ADVIL) 800 MG tablet Take 1 tablet (800 mg total) by mouth every 8 (eight) hours as needed. 30 tablet 0   KLOR-CON M20 20 MEQ tablet TAKE 1 TABLET  BY MOUTH TWICE A DAY 60 tablet 0   lidocaine-prilocaine (EMLA) cream Apply topically daily.     loratadine (CLARITIN) 10 MG tablet Take 10 mg by mouth daily.     magic mouthwash (nystatin, diphenhydrAMINE, alum & mag hydroxide) suspension mixture Swish and swallow 5 mLs by mouth 3 (three) times daily as needed for mouth pain. (Patient not taking: Reported on 04/14/2022) 140 mL 1   Multiple Vitamins-Minerals (ONE A DAY IMMUNITY DEFENSE) CHEW Chew 1 tablet by mouth daily.     oxyCODONE (OXY IR/ROXICODONE) 5 MG immediate release tablet Take 1 tablet (5 mg total) by mouth every 6 (six) hours as needed for severe pain. (Patient not taking: Reported on 04/14/2022) 15 tablet 0   traMADol (ULTRAM) 50 MG  tablet Take 1 tablet (50 mg total) by mouth every 6 (six) hours as needed. (Patient not taking: Reported on 04/14/2022) 5 tablet 0   No current facility-administered medications for this visit.   Facility-Administered Medications Ordered in Other Visits  Medication Dose Route Frequency Provider Last Rate Last Admin   sodium chloride flush (NS) 0.9 % injection 10 mL  10 mL Intracatheter PRN Cheyenne Merle, MD   10 mL at 05/06/22 1208    PHYSICAL EXAMINATION: ECOG PERFORMANCE STATUS: 0 - Asymptomatic  Vitals:   05/06/22 1007  BP: 117/80  Pulse: 81  Resp: 17  Temp: 98.2 F (36.8 C)  SpO2: 100%   Wt Readings from Last 3 Encounters:  05/06/22 187 lb 14.4 oz (85.2 kg)  04/14/22 182 lb 12.8 oz (82.9 kg)  03/16/22 184 lb 1.4 oz (83.5 kg)     GENERAL:alert, no distress and comfortable SKIN: skin color normal, no rashes or significant lesions EYES: normal, Conjunctiva are pink and non-injected, sclera clear  NEURO: alert & oriented x 3 with fluent speech  LABORATORY DATA:  I have reviewed the data as listed    Latest Ref Rng & Units 05/06/2022    9:41 AM 04/16/2022    7:45 AM 03/25/2022    9:55 AM  CBC  WBC 4.0 - 10.5 K/uL 6.2  5.8  6.8   Hemoglobin 12.0 - 15.0 g/dL 11.0  10.7  10.3   Hematocrit 36.0 - 46.0 % 32.9  32.1  29.9   Platelets 150 - 400 K/uL 273  264  286         Latest Ref Rng & Units 05/06/2022    9:41 AM 04/16/2022    7:45 AM 03/25/2022    9:55 AM  CMP  Glucose 70 - 99 mg/dL 111  128  111   BUN 6 - 20 mg/dL _0 Creatinine 0.44 - 1.00 mg/dL 0.79  0.85  0.68   Sodium 135 - 145 mmol/L 138  139  137   Potassium 3.5 - 5.1 mmol/L 3.9  3.3  3.6   Chloride 98 - 111 mmol/L 103  104  103   CO2 22 - 32 mmol/L _1 Calcium 8.9 - 10.3 mg/dL 9.5  9.6  9.8   Total Protein 6.5 - 8.1 g/dL 7.7  7.7  7.5   Total Bilirubin 0.3 - 1.2 mg/dL 0.5  0.6  0.4   Alkaline Phos 38 - 126 U/L 82  84  92   AST 15 - 41 U/L 24  33  21   ALT 0 - 44 U/L 27  40  25        RADIOGRAPHIC STUDIES: I have  personally reviewed the radiological images as listed and agreed with the findings in the report. No results found.    Orders Placed This Encounter  Procedures   CBC with Differential ([place] Only)    Standing Status:   Future    Standing Expiration Date:   05/28/2023   CMP ([facility] only)    Standing Status:   Future    Standing Expiration Date:   05/28/2023   CBC with Differential ([place] Only)    Standing Status:   Future    Standing Expiration Date:   06/18/2023   CMP ([place] only)    Standing Status:   Future    Standing Expiration Date:   06/18/2023   CBC with Differential ([place] Only)    Standing Status:   Future    Standing Expiration Date:   07/09/2023   CMP ([place] only)    Standing Status:   Future    Standing Expiration Date:   07/09/2023   CBC with Differential ([place] Only)    Standing Status:   Future    Standing Expiration Date:   07/30/2023   CMP ([place] only)    Standing Status:   Future    Standing Expiration Date:   07/30/2023   CBC with Differential ([place] Only)    Standing Status:   Future    Standing Expiration Date:   08/20/2023   CMP ([place] only)    Standing Status:   Future    Standing Expiration Date:   08/20/2023   All questions were answered. The patient knows to call the clinic with any problems, questions or concerns. No barriers to learning was detected. The total time spent in the appointment was 30 minutes.     Cheyenne Merle, MD 05/06/2022   I, Wilburn Mylar, am acting as scribe for Cheyenne Merle, MD.   I have reviewed the above documentation for accuracy and completeness, and I agree with the above.

## 2022-05-07 ENCOUNTER — Ambulatory Visit
Admission: RE | Admit: 2022-05-07 | Discharge: 2022-05-07 | Disposition: A | Discharge status: Home / Self Care | Payer: Federal, State, Local not specified - PPO | Source: Ambulatory Visit | Attending: Radiation Oncology | Admitting: Radiation Oncology

## 2022-05-07 ENCOUNTER — Other Ambulatory Visit: Payer: Self-pay

## 2022-05-07 DIAGNOSIS — Z170 Estrogen receptor positive status [ER+]: Secondary | ICD-10-CM | POA: Diagnosis not present

## 2022-05-07 DIAGNOSIS — Z510 Encounter for antineoplastic radiation therapy: Secondary | ICD-10-CM | POA: Diagnosis not present

## 2022-05-07 DIAGNOSIS — Z79899 Other long term (current) drug therapy: Secondary | ICD-10-CM | POA: Diagnosis not present

## 2022-05-07 DIAGNOSIS — C50312 Malignant neoplasm of lower-inner quadrant of left female breast: Secondary | ICD-10-CM | POA: Diagnosis not present

## 2022-05-07 DIAGNOSIS — Z5112 Encounter for antineoplastic immunotherapy: Secondary | ICD-10-CM | POA: Diagnosis not present

## 2022-05-07 LAB — RAD ONC ARIA SESSION SUMMARY
Course Elapsed Days: 2
Plan Fractions Treated to Date: 3
Plan Prescribed Dose Per Fraction: 1.8 Gy
Plan Total Fractions Prescribed: 28
Plan Total Prescribed Dose: 50.4 Gy
Reference Point Dosage Given to Date: 5.4 Gy
Reference Point Session Dosage Given: 1.8 Gy
Session Number: 3

## 2022-05-07 LAB — CANCER ANTIGEN 27.29: CA 27.29: 22.9 U/mL (ref 0.0–38.6)

## 2022-05-10 ENCOUNTER — Ambulatory Visit
Admission: RE | Admit: 2022-05-10 | Discharge: 2022-05-10 | Disposition: A | Discharge status: Home / Self Care | Payer: Federal, State, Local not specified - PPO | Source: Ambulatory Visit | Attending: Radiation Oncology | Admitting: Radiation Oncology

## 2022-05-10 ENCOUNTER — Other Ambulatory Visit: Payer: Self-pay

## 2022-05-10 ENCOUNTER — Ambulatory Visit: Payer: Federal, State, Local not specified - PPO | Admitting: Physical Therapy

## 2022-05-10 DIAGNOSIS — Z510 Encounter for antineoplastic radiation therapy: Secondary | ICD-10-CM | POA: Diagnosis not present

## 2022-05-10 DIAGNOSIS — Z5112 Encounter for antineoplastic immunotherapy: Secondary | ICD-10-CM | POA: Diagnosis not present

## 2022-05-10 DIAGNOSIS — C50312 Malignant neoplasm of lower-inner quadrant of left female breast: Secondary | ICD-10-CM | POA: Diagnosis not present

## 2022-05-10 DIAGNOSIS — Z170 Estrogen receptor positive status [ER+]: Secondary | ICD-10-CM | POA: Diagnosis not present

## 2022-05-10 DIAGNOSIS — Z79899 Other long term (current) drug therapy: Secondary | ICD-10-CM | POA: Diagnosis not present

## 2022-05-10 LAB — RAD ONC ARIA SESSION SUMMARY
Course Elapsed Days: 5
Plan Fractions Treated to Date: 4
Plan Prescribed Dose Per Fraction: 1.8 Gy
Plan Total Fractions Prescribed: 28
Plan Total Prescribed Dose: 50.4 Gy
Reference Point Dosage Given to Date: 7.2 Gy
Reference Point Session Dosage Given: 1.8 Gy
Session Number: 4

## 2022-05-11 ENCOUNTER — Ambulatory Visit
Admission: RE | Admit: 2022-05-11 | Discharge: 2022-05-11 | Disposition: A | Discharge status: Home / Self Care | Payer: Federal, State, Local not specified - PPO | Source: Ambulatory Visit | Attending: Radiation Oncology | Admitting: Radiation Oncology

## 2022-05-11 ENCOUNTER — Other Ambulatory Visit: Payer: Self-pay

## 2022-05-11 ENCOUNTER — Encounter: Payer: Self-pay | Admitting: Hematology

## 2022-05-11 DIAGNOSIS — C50312 Malignant neoplasm of lower-inner quadrant of left female breast: Secondary | ICD-10-CM

## 2022-05-11 DIAGNOSIS — Z5112 Encounter for antineoplastic immunotherapy: Secondary | ICD-10-CM | POA: Diagnosis not present

## 2022-05-11 DIAGNOSIS — Z79899 Other long term (current) drug therapy: Secondary | ICD-10-CM | POA: Diagnosis not present

## 2022-05-11 DIAGNOSIS — Z170 Estrogen receptor positive status [ER+]: Secondary | ICD-10-CM | POA: Diagnosis not present

## 2022-05-11 DIAGNOSIS — Z510 Encounter for antineoplastic radiation therapy: Secondary | ICD-10-CM | POA: Diagnosis not present

## 2022-05-11 LAB — RAD ONC ARIA SESSION SUMMARY
Course Elapsed Days: 6
Plan Fractions Treated to Date: 5
Plan Prescribed Dose Per Fraction: 1.8 Gy
Plan Total Fractions Prescribed: 28
Plan Total Prescribed Dose: 50.4 Gy
Reference Point Dosage Given to Date: 9 Gy
Reference Point Session Dosage Given: 1.8 Gy
Session Number: 5

## 2022-05-11 MED ORDER — ALRA NON-METALLIC DEODORANT (RAD-ONC)
1.0000 | Freq: Once | TOPICAL | Status: AC
  Administered 2022-05-11: 1 via TOPICAL

## 2022-05-11 MED ORDER — RADIAPLEXRX EX GEL: Freq: Once | CUTANEOUS | Status: AC

## 2022-05-12 ENCOUNTER — Ambulatory Visit: Payer: Federal, State, Local not specified - PPO | Admitting: Physical Therapy

## 2022-05-12 ENCOUNTER — Other Ambulatory Visit: Payer: Self-pay

## 2022-05-12 ENCOUNTER — Ambulatory Visit
Admission: RE | Admit: 2022-05-12 | Discharge: 2022-05-12 | Disposition: A | Discharge status: Home / Self Care | Payer: Federal, State, Local not specified - PPO | Source: Ambulatory Visit | Attending: Radiation Oncology | Admitting: Radiation Oncology

## 2022-05-12 DIAGNOSIS — Z510 Encounter for antineoplastic radiation therapy: Secondary | ICD-10-CM | POA: Diagnosis not present

## 2022-05-12 DIAGNOSIS — Z170 Estrogen receptor positive status [ER+]: Secondary | ICD-10-CM | POA: Diagnosis not present

## 2022-05-12 DIAGNOSIS — C50312 Malignant neoplasm of lower-inner quadrant of left female breast: Secondary | ICD-10-CM | POA: Diagnosis not present

## 2022-05-12 DIAGNOSIS — Z79899 Other long term (current) drug therapy: Secondary | ICD-10-CM | POA: Diagnosis not present

## 2022-05-12 DIAGNOSIS — Z5112 Encounter for antineoplastic immunotherapy: Secondary | ICD-10-CM | POA: Diagnosis not present

## 2022-05-12 LAB — RAD ONC ARIA SESSION SUMMARY
Course Elapsed Days: 7
Plan Fractions Treated to Date: 6
Plan Prescribed Dose Per Fraction: 1.8 Gy
Plan Total Fractions Prescribed: 28
Plan Total Prescribed Dose: 50.4 Gy
Reference Point Dosage Given to Date: 10.8 Gy
Reference Point Session Dosage Given: 1.8 Gy
Session Number: 6

## 2022-05-13 ENCOUNTER — Ambulatory Visit
Admission: RE | Admit: 2022-05-13 | Discharge: 2022-05-13 | Disposition: A | Discharge status: Home / Self Care | Payer: Federal, State, Local not specified - PPO | Source: Ambulatory Visit | Attending: Radiation Oncology | Admitting: Radiation Oncology

## 2022-05-13 ENCOUNTER — Other Ambulatory Visit: Payer: Self-pay

## 2022-05-13 DIAGNOSIS — Z5112 Encounter for antineoplastic immunotherapy: Secondary | ICD-10-CM | POA: Diagnosis not present

## 2022-05-13 DIAGNOSIS — Z510 Encounter for antineoplastic radiation therapy: Secondary | ICD-10-CM | POA: Diagnosis not present

## 2022-05-13 DIAGNOSIS — Z170 Estrogen receptor positive status [ER+]: Secondary | ICD-10-CM | POA: Diagnosis not present

## 2022-05-13 DIAGNOSIS — Z79899 Other long term (current) drug therapy: Secondary | ICD-10-CM | POA: Diagnosis not present

## 2022-05-13 DIAGNOSIS — C50312 Malignant neoplasm of lower-inner quadrant of left female breast: Secondary | ICD-10-CM | POA: Diagnosis not present

## 2022-05-13 LAB — RAD ONC ARIA SESSION SUMMARY
Course Elapsed Days: 8
Plan Fractions Treated to Date: 7
Plan Prescribed Dose Per Fraction: 1.8 Gy
Plan Total Fractions Prescribed: 28
Plan Total Prescribed Dose: 50.4 Gy
Reference Point Dosage Given to Date: 12.6 Gy
Reference Point Session Dosage Given: 1.8 Gy
Session Number: 7

## 2022-05-14 ENCOUNTER — Other Ambulatory Visit: Payer: Self-pay

## 2022-05-14 ENCOUNTER — Ambulatory Visit
Admission: RE | Admit: 2022-05-14 | Discharge: 2022-05-14 | Disposition: A | Discharge status: Home / Self Care | Payer: Federal, State, Local not specified - PPO | Source: Ambulatory Visit | Attending: Radiation Oncology | Admitting: Radiation Oncology

## 2022-05-14 DIAGNOSIS — C50312 Malignant neoplasm of lower-inner quadrant of left female breast: Secondary | ICD-10-CM | POA: Diagnosis not present

## 2022-05-14 DIAGNOSIS — Z510 Encounter for antineoplastic radiation therapy: Secondary | ICD-10-CM | POA: Diagnosis not present

## 2022-05-14 DIAGNOSIS — Z5112 Encounter for antineoplastic immunotherapy: Secondary | ICD-10-CM | POA: Diagnosis not present

## 2022-05-14 DIAGNOSIS — Z79899 Other long term (current) drug therapy: Secondary | ICD-10-CM | POA: Diagnosis not present

## 2022-05-14 DIAGNOSIS — Z170 Estrogen receptor positive status [ER+]: Secondary | ICD-10-CM | POA: Diagnosis not present

## 2022-05-14 LAB — RAD ONC ARIA SESSION SUMMARY
Course Elapsed Days: 9
Plan Fractions Treated to Date: 8
Plan Prescribed Dose Per Fraction: 1.8 Gy
Plan Total Fractions Prescribed: 28
Plan Total Prescribed Dose: 50.4 Gy
Reference Point Dosage Given to Date: 14.4 Gy
Reference Point Session Dosage Given: 1.8 Gy
Session Number: 8

## 2022-05-17 ENCOUNTER — Other Ambulatory Visit: Payer: Self-pay

## 2022-05-17 ENCOUNTER — Ambulatory Visit
Admission: RE | Admit: 2022-05-17 | Discharge: 2022-05-17 | Disposition: A | Discharge status: Home / Self Care | Payer: Federal, State, Local not specified - PPO | Source: Ambulatory Visit | Attending: Radiation Oncology | Admitting: Radiation Oncology

## 2022-05-17 DIAGNOSIS — Z5112 Encounter for antineoplastic immunotherapy: Secondary | ICD-10-CM | POA: Diagnosis not present

## 2022-05-17 DIAGNOSIS — C50312 Malignant neoplasm of lower-inner quadrant of left female breast: Secondary | ICD-10-CM | POA: Diagnosis not present

## 2022-05-17 DIAGNOSIS — Z170 Estrogen receptor positive status [ER+]: Secondary | ICD-10-CM | POA: Diagnosis not present

## 2022-05-17 DIAGNOSIS — Z510 Encounter for antineoplastic radiation therapy: Secondary | ICD-10-CM | POA: Diagnosis not present

## 2022-05-17 DIAGNOSIS — Z79899 Other long term (current) drug therapy: Secondary | ICD-10-CM | POA: Diagnosis not present

## 2022-05-17 LAB — RAD ONC ARIA SESSION SUMMARY
Course Elapsed Days: 12
Plan Fractions Treated to Date: 9
Plan Prescribed Dose Per Fraction: 1.8 Gy
Plan Total Fractions Prescribed: 28
Plan Total Prescribed Dose: 50.4 Gy
Reference Point Dosage Given to Date: 16.2 Gy
Reference Point Session Dosage Given: 1.8 Gy
Session Number: 9

## 2022-05-18 ENCOUNTER — Other Ambulatory Visit: Payer: Self-pay

## 2022-05-18 ENCOUNTER — Ambulatory Visit
Admission: RE | Admit: 2022-05-18 | Discharge: 2022-05-18 | Disposition: A | Discharge status: Home / Self Care | Payer: Federal, State, Local not specified - PPO | Source: Ambulatory Visit | Attending: Radiation Oncology | Admitting: Radiation Oncology

## 2022-05-18 DIAGNOSIS — Z170 Estrogen receptor positive status [ER+]: Secondary | ICD-10-CM | POA: Diagnosis not present

## 2022-05-18 DIAGNOSIS — Z79899 Other long term (current) drug therapy: Secondary | ICD-10-CM | POA: Diagnosis not present

## 2022-05-18 DIAGNOSIS — Z5112 Encounter for antineoplastic immunotherapy: Secondary | ICD-10-CM | POA: Diagnosis not present

## 2022-05-18 DIAGNOSIS — Z510 Encounter for antineoplastic radiation therapy: Secondary | ICD-10-CM | POA: Diagnosis not present

## 2022-05-18 DIAGNOSIS — C50312 Malignant neoplasm of lower-inner quadrant of left female breast: Secondary | ICD-10-CM | POA: Diagnosis not present

## 2022-05-18 LAB — RAD ONC ARIA SESSION SUMMARY
Course Elapsed Days: 13
Plan Fractions Treated to Date: 10
Plan Prescribed Dose Per Fraction: 1.8 Gy
Plan Total Fractions Prescribed: 28
Plan Total Prescribed Dose: 50.4 Gy
Reference Point Dosage Given to Date: 18 Gy
Reference Point Session Dosage Given: 1.8 Gy
Session Number: 10

## 2022-05-19 ENCOUNTER — Ambulatory Visit
Admission: RE | Admit: 2022-05-19 | Discharge: 2022-05-19 | Disposition: A | Discharge status: Home / Self Care | Payer: Federal, State, Local not specified - PPO | Source: Ambulatory Visit | Attending: Radiation Oncology | Admitting: Radiation Oncology

## 2022-05-19 ENCOUNTER — Other Ambulatory Visit: Payer: Self-pay

## 2022-05-19 ENCOUNTER — Other Ambulatory Visit: Payer: Self-pay | Admitting: Hematology

## 2022-05-19 DIAGNOSIS — Z79899 Other long term (current) drug therapy: Secondary | ICD-10-CM | POA: Diagnosis not present

## 2022-05-19 DIAGNOSIS — Z5112 Encounter for antineoplastic immunotherapy: Secondary | ICD-10-CM | POA: Diagnosis not present

## 2022-05-19 DIAGNOSIS — Z510 Encounter for antineoplastic radiation therapy: Secondary | ICD-10-CM | POA: Diagnosis not present

## 2022-05-19 DIAGNOSIS — C50312 Malignant neoplasm of lower-inner quadrant of left female breast: Secondary | ICD-10-CM | POA: Diagnosis not present

## 2022-05-19 DIAGNOSIS — Z170 Estrogen receptor positive status [ER+]: Secondary | ICD-10-CM | POA: Diagnosis not present

## 2022-05-19 LAB — RAD ONC ARIA SESSION SUMMARY
Course Elapsed Days: 14
Plan Fractions Treated to Date: 11
Plan Prescribed Dose Per Fraction: 1.8 Gy
Plan Total Fractions Prescribed: 28
Plan Total Prescribed Dose: 50.4 Gy
Reference Point Dosage Given to Date: 19.8 Gy
Reference Point Session Dosage Given: 1.8 Gy
Session Number: 11

## 2022-05-20 ENCOUNTER — Ambulatory Visit
Admission: RE | Admit: 2022-05-20 | Discharge: 2022-05-20 | Disposition: A | Discharge status: Home / Self Care | Payer: Federal, State, Local not specified - PPO | Source: Ambulatory Visit | Attending: Radiation Oncology | Admitting: Radiation Oncology

## 2022-05-20 ENCOUNTER — Other Ambulatory Visit: Payer: Self-pay

## 2022-05-20 DIAGNOSIS — Z79899 Other long term (current) drug therapy: Secondary | ICD-10-CM | POA: Diagnosis not present

## 2022-05-20 DIAGNOSIS — Z5112 Encounter for antineoplastic immunotherapy: Secondary | ICD-10-CM | POA: Diagnosis not present

## 2022-05-20 DIAGNOSIS — Z510 Encounter for antineoplastic radiation therapy: Secondary | ICD-10-CM | POA: Diagnosis not present

## 2022-05-20 DIAGNOSIS — C50312 Malignant neoplasm of lower-inner quadrant of left female breast: Secondary | ICD-10-CM | POA: Diagnosis not present

## 2022-05-20 DIAGNOSIS — Z170 Estrogen receptor positive status [ER+]: Secondary | ICD-10-CM | POA: Diagnosis not present

## 2022-05-20 LAB — RAD ONC ARIA SESSION SUMMARY
Course Elapsed Days: 15
Plan Fractions Treated to Date: 12
Plan Prescribed Dose Per Fraction: 1.8 Gy
Plan Total Fractions Prescribed: 28
Plan Total Prescribed Dose: 50.4 Gy
Reference Point Dosage Given to Date: 21.6 Gy
Reference Point Session Dosage Given: 1.8 Gy
Session Number: 12

## 2022-05-21 ENCOUNTER — Other Ambulatory Visit: Payer: Self-pay

## 2022-05-21 ENCOUNTER — Encounter: Payer: Federal, State, Local not specified - PPO | Admitting: Physical Therapy

## 2022-05-21 ENCOUNTER — Ambulatory Visit
Admission: RE | Admit: 2022-05-21 | Discharge: 2022-05-21 | Disposition: A | Discharge status: Home / Self Care | Payer: Federal, State, Local not specified - PPO | Source: Ambulatory Visit | Attending: Radiation Oncology | Admitting: Radiation Oncology

## 2022-05-21 DIAGNOSIS — Z5112 Encounter for antineoplastic immunotherapy: Secondary | ICD-10-CM | POA: Diagnosis not present

## 2022-05-21 DIAGNOSIS — Z170 Estrogen receptor positive status [ER+]: Secondary | ICD-10-CM | POA: Diagnosis not present

## 2022-05-21 DIAGNOSIS — C50312 Malignant neoplasm of lower-inner quadrant of left female breast: Secondary | ICD-10-CM | POA: Diagnosis not present

## 2022-05-21 DIAGNOSIS — Z510 Encounter for antineoplastic radiation therapy: Secondary | ICD-10-CM | POA: Diagnosis not present

## 2022-05-21 DIAGNOSIS — Z79899 Other long term (current) drug therapy: Secondary | ICD-10-CM | POA: Diagnosis not present

## 2022-05-21 LAB — RAD ONC ARIA SESSION SUMMARY
Course Elapsed Days: 16
Plan Fractions Treated to Date: 13
Plan Prescribed Dose Per Fraction: 1.8 Gy
Plan Total Fractions Prescribed: 28
Plan Total Prescribed Dose: 50.4 Gy
Reference Point Dosage Given to Date: 23.4 Gy
Reference Point Session Dosage Given: 1.8 Gy
Session Number: 13

## 2022-05-24 ENCOUNTER — Other Ambulatory Visit: Payer: Self-pay

## 2022-05-24 ENCOUNTER — Ambulatory Visit
Admission: RE | Admit: 2022-05-24 | Discharge: 2022-05-24 | Disposition: A | Discharge status: Home / Self Care | Payer: Federal, State, Local not specified - PPO | Source: Ambulatory Visit | Attending: Radiation Oncology | Admitting: Radiation Oncology

## 2022-05-24 DIAGNOSIS — Z79899 Other long term (current) drug therapy: Secondary | ICD-10-CM | POA: Diagnosis not present

## 2022-05-24 DIAGNOSIS — Z170 Estrogen receptor positive status [ER+]: Secondary | ICD-10-CM | POA: Diagnosis not present

## 2022-05-24 DIAGNOSIS — C50312 Malignant neoplasm of lower-inner quadrant of left female breast: Secondary | ICD-10-CM | POA: Diagnosis not present

## 2022-05-24 DIAGNOSIS — Z510 Encounter for antineoplastic radiation therapy: Secondary | ICD-10-CM | POA: Diagnosis not present

## 2022-05-24 DIAGNOSIS — Z5112 Encounter for antineoplastic immunotherapy: Secondary | ICD-10-CM | POA: Diagnosis not present

## 2022-05-24 LAB — RAD ONC ARIA SESSION SUMMARY
Course Elapsed Days: 19
Plan Fractions Treated to Date: 14
Plan Prescribed Dose Per Fraction: 1.8 Gy
Plan Total Fractions Prescribed: 28
Plan Total Prescribed Dose: 50.4 Gy
Reference Point Dosage Given to Date: 25.2 Gy
Reference Point Session Dosage Given: 1.8 Gy
Session Number: 14

## 2022-05-25 ENCOUNTER — Ambulatory Visit
Admission: RE | Admit: 2022-05-25 | Discharge: 2022-05-25 | Disposition: A | Discharge status: Home / Self Care | Payer: Federal, State, Local not specified - PPO | Source: Ambulatory Visit | Attending: Radiation Oncology | Admitting: Radiation Oncology

## 2022-05-25 ENCOUNTER — Other Ambulatory Visit: Payer: Self-pay

## 2022-05-25 DIAGNOSIS — Z170 Estrogen receptor positive status [ER+]: Secondary | ICD-10-CM | POA: Diagnosis not present

## 2022-05-25 DIAGNOSIS — Z79899 Other long term (current) drug therapy: Secondary | ICD-10-CM | POA: Diagnosis not present

## 2022-05-25 DIAGNOSIS — C50312 Malignant neoplasm of lower-inner quadrant of left female breast: Secondary | ICD-10-CM

## 2022-05-25 DIAGNOSIS — Z5112 Encounter for antineoplastic immunotherapy: Secondary | ICD-10-CM | POA: Diagnosis not present

## 2022-05-25 DIAGNOSIS — Z510 Encounter for antineoplastic radiation therapy: Secondary | ICD-10-CM | POA: Diagnosis not present

## 2022-05-25 LAB — RAD ONC ARIA SESSION SUMMARY
Course Elapsed Days: 20
Plan Fractions Treated to Date: 15
Plan Prescribed Dose Per Fraction: 1.8 Gy
Plan Total Fractions Prescribed: 28
Plan Total Prescribed Dose: 50.4 Gy
Reference Point Dosage Given to Date: 27 Gy
Reference Point Session Dosage Given: 1.8 Gy
Session Number: 15

## 2022-05-25 MED ORDER — RADIAPLEXRX EX GEL: Freq: Once | CUTANEOUS | Status: AC

## 2022-05-26 ENCOUNTER — Other Ambulatory Visit: Payer: Self-pay

## 2022-05-26 ENCOUNTER — Ambulatory Visit
Admission: RE | Admit: 2022-05-26 | Discharge: 2022-05-26 | Disposition: A | Discharge status: Home / Self Care | Payer: Federal, State, Local not specified - PPO | Source: Ambulatory Visit | Attending: Radiation Oncology | Admitting: Radiation Oncology

## 2022-05-26 DIAGNOSIS — C50312 Malignant neoplasm of lower-inner quadrant of left female breast: Secondary | ICD-10-CM | POA: Diagnosis not present

## 2022-05-26 DIAGNOSIS — Z510 Encounter for antineoplastic radiation therapy: Secondary | ICD-10-CM | POA: Diagnosis not present

## 2022-05-26 DIAGNOSIS — Z79899 Other long term (current) drug therapy: Secondary | ICD-10-CM | POA: Diagnosis not present

## 2022-05-26 DIAGNOSIS — Z170 Estrogen receptor positive status [ER+]: Secondary | ICD-10-CM | POA: Diagnosis not present

## 2022-05-26 DIAGNOSIS — Z5112 Encounter for antineoplastic immunotherapy: Secondary | ICD-10-CM | POA: Diagnosis not present

## 2022-05-26 LAB — RAD ONC ARIA SESSION SUMMARY
Course Elapsed Days: 21
Plan Fractions Treated to Date: 16
Plan Prescribed Dose Per Fraction: 1.8 Gy
Plan Total Fractions Prescribed: 28
Plan Total Prescribed Dose: 50.4 Gy
Reference Point Dosage Given to Date: 28.8 Gy
Reference Point Session Dosage Given: 1.8 Gy
Session Number: 16

## 2022-05-27 ENCOUNTER — Inpatient Hospital Stay: Payer: Federal, State, Local not specified - PPO

## 2022-05-27 ENCOUNTER — Encounter: Payer: Self-pay | Admitting: Hematology

## 2022-05-27 ENCOUNTER — Other Ambulatory Visit: Payer: Self-pay

## 2022-05-27 ENCOUNTER — Ambulatory Visit
Admission: RE | Admit: 2022-05-27 | Discharge: 2022-05-27 | Disposition: A | Discharge status: Home / Self Care | Payer: Federal, State, Local not specified - PPO | Source: Ambulatory Visit | Attending: Radiation Oncology | Admitting: Radiation Oncology

## 2022-05-27 VITALS — BP 103/61 | HR 84 | Temp 98.3°F | Resp 16 | Ht 62.0 in | Wt 185.8 lb

## 2022-05-27 DIAGNOSIS — Z79899 Other long term (current) drug therapy: Secondary | ICD-10-CM | POA: Diagnosis not present

## 2022-05-27 DIAGNOSIS — Z510 Encounter for antineoplastic radiation therapy: Secondary | ICD-10-CM | POA: Diagnosis not present

## 2022-05-27 DIAGNOSIS — C50312 Malignant neoplasm of lower-inner quadrant of left female breast: Secondary | ICD-10-CM | POA: Diagnosis not present

## 2022-05-27 DIAGNOSIS — Z95828 Presence of other vascular implants and grafts: Secondary | ICD-10-CM

## 2022-05-27 DIAGNOSIS — Z5112 Encounter for antineoplastic immunotherapy: Secondary | ICD-10-CM | POA: Diagnosis not present

## 2022-05-27 DIAGNOSIS — Z170 Estrogen receptor positive status [ER+]: Secondary | ICD-10-CM | POA: Diagnosis not present

## 2022-05-27 LAB — CBC WITH DIFFERENTIAL (CANCER CENTER ONLY)
Abs Immature Granulocytes: 0.01 10*3/uL (ref 0.00–0.07)
Basophils Absolute: 0 10*3/uL (ref 0.0–0.1)
Basophils Relative: 1 %
Eosinophils Absolute: 0.1 10*3/uL (ref 0.0–0.5)
Eosinophils Relative: 3 %
HCT: 33.6 % — ABNORMAL LOW (ref 36.0–46.0)
Hemoglobin: 11.8 g/dL — ABNORMAL LOW (ref 12.0–15.0)
Immature Granulocytes: 0 %
Lymphocytes Relative: 26 %
Lymphs Abs: 1.3 10*3/uL (ref 0.7–4.0)
MCH: 32.2 pg (ref 26.0–34.0)
MCHC: 35.1 g/dL (ref 30.0–36.0)
MCV: 91.8 fL (ref 80.0–100.0)
Monocytes Absolute: 0.4 10*3/uL (ref 0.1–1.0)
Monocytes Relative: 9 %
Neutro Abs: 3 10*3/uL (ref 1.7–7.7)
Neutrophils Relative %: 61 %
Platelet Count: 272 10*3/uL (ref 150–400)
RBC: 3.66 MIL/uL — ABNORMAL LOW (ref 3.87–5.11)
RDW: 13 % (ref 11.5–15.5)
WBC Count: 4.8 10*3/uL (ref 4.0–10.5)
nRBC: 0 % (ref 0.0–0.2)

## 2022-05-27 LAB — CMP (CANCER CENTER ONLY)
ALT: 21 U/L (ref 0–44)
AST: 21 U/L (ref 15–41)
Albumin: 4.2 g/dL (ref 3.5–5.0)
Alkaline Phosphatase: 85 U/L (ref 38–126)
Anion gap: 9 (ref 5–15)
BUN: 13 mg/dL (ref 6–20)
CO2: 28 mmol/L (ref 22–32)
Calcium: 9.5 mg/dL (ref 8.9–10.3)
Chloride: 103 mmol/L (ref 98–111)
Creatinine: 0.81 mg/dL (ref 0.44–1.00)
GFR, Estimated: >60 mL/min (ref >60)
Glucose, Bld: 116 mg/dL — ABNORMAL HIGH (ref 70–99)
Potassium: 3.4 mmol/L — ABNORMAL LOW (ref 3.5–5.1)
Sodium: 140 mmol/L (ref 135–145)
Total Bilirubin: 0.4 mg/dL (ref 0.3–1.2)
Total Protein: 7.5 g/dL (ref 6.5–8.1)

## 2022-05-27 LAB — RAD ONC ARIA SESSION SUMMARY
Course Elapsed Days: 22
Plan Fractions Treated to Date: 17
Plan Prescribed Dose Per Fraction: 1.8 Gy
Plan Total Fractions Prescribed: 28
Plan Total Prescribed Dose: 50.4 Gy
Reference Point Dosage Given to Date: 30.6 Gy
Reference Point Session Dosage Given: 1.8 Gy
Session Number: 17

## 2022-05-27 MED ORDER — SODIUM CHLORIDE 0.9% FLUSH
10.0000 mL | Freq: Once | INTRAVENOUS | Status: AC
  Administered 2022-05-27: 10 mL

## 2022-05-27 MED ORDER — SODIUM CHLORIDE 0.9% FLUSH
10.0000 mL | INTRAVENOUS | Status: DC | PRN
  Administered 2022-05-27: 10 mL

## 2022-05-27 MED ORDER — SODIUM CHLORIDE 0.9 % IV SOLN: Freq: Once | INTRAVENOUS | Status: AC

## 2022-05-27 MED ORDER — HEPARIN SOD (PORK) LOCK FLUSH 100 UNIT/ML IV SOLN
500.0000 [IU] | Freq: Once | INTRAVENOUS | Status: AC | PRN
  Administered 2022-05-27: 500 [IU]

## 2022-05-27 MED ORDER — SODIUM CHLORIDE 0.9 % IV SOLN
200.0000 mg | Freq: Once | INTRAVENOUS | Status: AC
  Administered 2022-05-27: 200 mg via INTRAVENOUS
  Filled 2022-05-27: qty 200

## 2022-05-27 MED ORDER — POTASSIUM CHLORIDE CRYS ER 20 MEQ PO TBCR: 20.0000 meq | EXTENDED_RELEASE_TABLET | Freq: Two times a day (BID) | ORAL | 0 refills | Status: DC

## 2022-05-27 NOTE — Patient Instructions (Signed)
[facility] MEDICAL ONCOLOGY  Discharge Instructions: Thank you for choosing [facility] to provide your oncology and hematology care.   If you have a lab appointment with the Cancer Center, please go directly to the Cancer Center and check in at the registration area.   Wear comfortable clothing and clothing appropriate for easy access to any Portacath or PICC line.   We strive to give you quality time with your provider. You may need to reschedule your appointment if you arrive late (15 or more minutes).  Arriving late affects you and other patients whose appointments are after yours.  Also, if you miss three or more appointments without notifying the office, you may be dismissed from the clinic at the provider's discretion.      For prescription refill requests, have your pharmacy contact our office and allow 72 hours for refills to be completed.    Today you received the following chemotherapy and/or immunotherapy agents Pembrolizumab      To help prevent nausea and vomiting after your treatment, we encourage you to take your nausea medication as directed.  BELOW ARE SYMPTOMS THAT SHOULD BE REPORTED IMMEDIATELY: *FEVER GREATER THAN 100.4 F (38 C) OR HIGHER *CHILLS OR SWEATING *NAUSEA AND VOMITING THAT IS NOT CONTROLLED WITH YOUR NAUSEA MEDICATION *UNUSUAL SHORTNESS OF BREATH *UNUSUAL BRUISING OR BLEEDING *URINARY PROBLEMS (pain or burning when urinating, or frequent urination) *BOWEL PROBLEMS (unusual diarrhea, constipation, pain near the anus) TENDERNESS IN MOUTH AND THROAT WITH OR WITHOUT PRESENCE OF ULCERS (sore throat, sores in mouth, or a toothache) UNUSUAL RASH, SWELLING OR PAIN  UNUSUAL VAGINAL DISCHARGE OR ITCHING   Items with * indicate a potential emergency and should be followed up as soon as possible or go to the Emergency Department if any problems should occur.  Please show the CHEMOTHERAPY ALERT CARD or IMMUNOTHERAPY ALERT CARD at check-in  to the Emergency Department and triage nurse.  Should you have questions after your visit or need to cancel or reschedule your appointment, please contact [facility] MEDICAL [facility]COL[facility]  Dept: [phone]  and follow the prompts.  Office hours are 8:00 a.m. to 4:30 p.m. Monday - Friday. Please note that voicemails left after 4:00 p.m. may not be returned until the following business day.  We are closed weekends and major holidays. You have access to a nurse at all times for urgent questions. Please call the main number to the clinic Dept: [phone] and follow the prompts.   For any non-urgent questions, you may also contact your provider using MyChart. We now offer e-Visits for anyone 18 and older to request care online for non-urgent symptoms. For details visit [email or web address].   Also download the MyChart app! Go to the app store, search "MyChart", open the app, select [facility], and log in with your MyChart username and password.  Masks are optional in the cancer centers. If you would like for your care team to wear a mask while they are taking care of you, please let them know. You may have one support person who is at least 40 years old accompany you for your appointments. 

## 2022-05-28 ENCOUNTER — Other Ambulatory Visit: Payer: Self-pay

## 2022-05-28 ENCOUNTER — Ambulatory Visit
Admission: RE | Admit: 2022-05-28 | Discharge: 2022-05-28 | Disposition: A | Discharge status: Home / Self Care | Payer: Federal, State, Local not specified - PPO | Source: Ambulatory Visit | Attending: Radiation Oncology | Admitting: Radiation Oncology

## 2022-05-28 DIAGNOSIS — Z170 Estrogen receptor positive status [ER+]: Secondary | ICD-10-CM | POA: Diagnosis not present

## 2022-05-28 DIAGNOSIS — C50312 Malignant neoplasm of lower-inner quadrant of left female breast: Secondary | ICD-10-CM | POA: Diagnosis not present

## 2022-05-28 DIAGNOSIS — Z5112 Encounter for antineoplastic immunotherapy: Secondary | ICD-10-CM | POA: Diagnosis not present

## 2022-05-28 DIAGNOSIS — Z79899 Other long term (current) drug therapy: Secondary | ICD-10-CM | POA: Diagnosis not present

## 2022-05-28 DIAGNOSIS — Z510 Encounter for antineoplastic radiation therapy: Secondary | ICD-10-CM | POA: Diagnosis not present

## 2022-05-28 LAB — RAD ONC ARIA SESSION SUMMARY
Course Elapsed Days: 23
Plan Fractions Treated to Date: 18
Plan Prescribed Dose Per Fraction: 1.8 Gy
Plan Total Fractions Prescribed: 28
Plan Total Prescribed Dose: 50.4 Gy
Reference Point Dosage Given to Date: 32.4 Gy
Reference Point Session Dosage Given: 1.8 Gy
Session Number: 18

## 2022-05-31 ENCOUNTER — Other Ambulatory Visit: Payer: Self-pay

## 2022-05-31 ENCOUNTER — Ambulatory Visit
Admission: RE | Admit: 2022-05-31 | Discharge: 2022-05-31 | Disposition: A | Discharge status: Home / Self Care | Payer: Federal, State, Local not specified - PPO | Source: Ambulatory Visit | Attending: Radiation Oncology | Admitting: Radiation Oncology

## 2022-05-31 DIAGNOSIS — Z5112 Encounter for antineoplastic immunotherapy: Secondary | ICD-10-CM | POA: Diagnosis not present

## 2022-05-31 DIAGNOSIS — Z170 Estrogen receptor positive status [ER+]: Secondary | ICD-10-CM | POA: Diagnosis not present

## 2022-05-31 DIAGNOSIS — Z510 Encounter for antineoplastic radiation therapy: Secondary | ICD-10-CM | POA: Diagnosis not present

## 2022-05-31 DIAGNOSIS — C50312 Malignant neoplasm of lower-inner quadrant of left female breast: Secondary | ICD-10-CM | POA: Diagnosis not present

## 2022-05-31 DIAGNOSIS — Z79899 Other long term (current) drug therapy: Secondary | ICD-10-CM | POA: Diagnosis not present

## 2022-05-31 LAB — RAD ONC ARIA SESSION SUMMARY
Course Elapsed Days: 26
Plan Fractions Treated to Date: 19
Plan Prescribed Dose Per Fraction: 1.8 Gy
Plan Total Fractions Prescribed: 28
Plan Total Prescribed Dose: 50.4 Gy
Reference Point Dosage Given to Date: 34.2 Gy
Reference Point Session Dosage Given: 1.8 Gy
Session Number: 19

## 2022-06-01 ENCOUNTER — Ambulatory Visit
Admission: RE | Admit: 2022-06-01 | Discharge: 2022-06-01 | Disposition: A | Discharge status: Home / Self Care | Payer: Federal, State, Local not specified - PPO | Source: Ambulatory Visit | Attending: Radiation Oncology | Admitting: Radiation Oncology

## 2022-06-01 ENCOUNTER — Ambulatory Visit: Payer: Federal, State, Local not specified - PPO | Admitting: Radiation Oncology

## 2022-06-01 ENCOUNTER — Other Ambulatory Visit: Payer: Self-pay

## 2022-06-01 DIAGNOSIS — Z510 Encounter for antineoplastic radiation therapy: Secondary | ICD-10-CM | POA: Diagnosis not present

## 2022-06-01 DIAGNOSIS — Z79899 Other long term (current) drug therapy: Secondary | ICD-10-CM | POA: Diagnosis not present

## 2022-06-01 DIAGNOSIS — Z5112 Encounter for antineoplastic immunotherapy: Secondary | ICD-10-CM | POA: Diagnosis not present

## 2022-06-01 DIAGNOSIS — Z170 Estrogen receptor positive status [ER+]: Secondary | ICD-10-CM | POA: Diagnosis not present

## 2022-06-01 DIAGNOSIS — C50312 Malignant neoplasm of lower-inner quadrant of left female breast: Secondary | ICD-10-CM | POA: Diagnosis not present

## 2022-06-01 LAB — RAD ONC ARIA SESSION SUMMARY
Course Elapsed Days: 27
Plan Fractions Treated to Date: 20
Plan Prescribed Dose Per Fraction: 1.8 Gy
Plan Total Fractions Prescribed: 28
Plan Total Prescribed Dose: 50.4 Gy
Reference Point Dosage Given to Date: 36 Gy
Reference Point Session Dosage Given: 1.8 Gy
Session Number: 20

## 2022-06-02 ENCOUNTER — Ambulatory Visit
Admission: RE | Admit: 2022-06-02 | Discharge: 2022-06-02 | Disposition: A | Discharge status: Home / Self Care | Payer: Federal, State, Local not specified - PPO | Source: Ambulatory Visit | Attending: Radiation Oncology | Admitting: Radiation Oncology

## 2022-06-02 ENCOUNTER — Other Ambulatory Visit: Payer: Self-pay

## 2022-06-02 DIAGNOSIS — D6481 Anemia due to antineoplastic chemotherapy: Secondary | ICD-10-CM | POA: Diagnosis not present

## 2022-06-02 DIAGNOSIS — Z510 Encounter for antineoplastic radiation therapy: Secondary | ICD-10-CM | POA: Insufficient documentation

## 2022-06-02 DIAGNOSIS — Z79620 Long term (current) use of immunosuppressive biologic: Secondary | ICD-10-CM | POA: Diagnosis not present

## 2022-06-02 DIAGNOSIS — Z5112 Encounter for antineoplastic immunotherapy: Secondary | ICD-10-CM | POA: Insufficient documentation

## 2022-06-02 DIAGNOSIS — Z170 Estrogen receptor positive status [ER+]: Secondary | ICD-10-CM | POA: Insufficient documentation

## 2022-06-02 DIAGNOSIS — C50312 Malignant neoplasm of lower-inner quadrant of left female breast: Secondary | ICD-10-CM | POA: Insufficient documentation

## 2022-06-02 LAB — RAD ONC ARIA SESSION SUMMARY
Course Elapsed Days: 28
Plan Fractions Treated to Date: 21
Plan Prescribed Dose Per Fraction: 1.8 Gy
Plan Total Fractions Prescribed: 28
Plan Total Prescribed Dose: 50.4 Gy
Reference Point Dosage Given to Date: 37.8 Gy
Reference Point Session Dosage Given: 1.8 Gy
Session Number: 21

## 2022-06-03 ENCOUNTER — Other Ambulatory Visit: Payer: Self-pay

## 2022-06-03 ENCOUNTER — Ambulatory Visit
Admission: RE | Admit: 2022-06-03 | Discharge: 2022-06-03 | Disposition: A | Discharge status: Home / Self Care | Payer: Federal, State, Local not specified - PPO | Source: Ambulatory Visit | Attending: Radiation Oncology | Admitting: Radiation Oncology

## 2022-06-03 ENCOUNTER — Encounter: Payer: Self-pay | Admitting: Hematology

## 2022-06-03 DIAGNOSIS — Z510 Encounter for antineoplastic radiation therapy: Secondary | ICD-10-CM | POA: Diagnosis not present

## 2022-06-03 DIAGNOSIS — D6481 Anemia due to antineoplastic chemotherapy: Secondary | ICD-10-CM | POA: Diagnosis not present

## 2022-06-03 DIAGNOSIS — Z170 Estrogen receptor positive status [ER+]: Secondary | ICD-10-CM | POA: Diagnosis not present

## 2022-06-03 DIAGNOSIS — Z5112 Encounter for antineoplastic immunotherapy: Secondary | ICD-10-CM | POA: Diagnosis not present

## 2022-06-03 DIAGNOSIS — Z79620 Long term (current) use of immunosuppressive biologic: Secondary | ICD-10-CM | POA: Diagnosis not present

## 2022-06-03 DIAGNOSIS — C50312 Malignant neoplasm of lower-inner quadrant of left female breast: Secondary | ICD-10-CM | POA: Diagnosis not present

## 2022-06-03 LAB — RAD ONC ARIA SESSION SUMMARY
Course Elapsed Days: 29
Plan Fractions Treated to Date: 22
Plan Prescribed Dose Per Fraction: 1.8 Gy
Plan Total Fractions Prescribed: 28
Plan Total Prescribed Dose: 50.4 Gy
Reference Point Dosage Given to Date: 39.6 Gy
Reference Point Session Dosage Given: 1.8 Gy
Session Number: 22

## 2022-06-04 ENCOUNTER — Other Ambulatory Visit: Payer: Self-pay

## 2022-06-04 ENCOUNTER — Ambulatory Visit
Admission: RE | Admit: 2022-06-04 | Discharge: 2022-06-04 | Disposition: A | Discharge status: Home / Self Care | Payer: Federal, State, Local not specified - PPO | Source: Ambulatory Visit | Attending: Radiation Oncology | Admitting: Radiation Oncology

## 2022-06-04 DIAGNOSIS — Z170 Estrogen receptor positive status [ER+]: Secondary | ICD-10-CM | POA: Diagnosis not present

## 2022-06-04 DIAGNOSIS — D6481 Anemia due to antineoplastic chemotherapy: Secondary | ICD-10-CM | POA: Diagnosis not present

## 2022-06-04 DIAGNOSIS — C50312 Malignant neoplasm of lower-inner quadrant of left female breast: Secondary | ICD-10-CM | POA: Diagnosis not present

## 2022-06-04 DIAGNOSIS — Z79620 Long term (current) use of immunosuppressive biologic: Secondary | ICD-10-CM | POA: Diagnosis not present

## 2022-06-04 DIAGNOSIS — Z510 Encounter for antineoplastic radiation therapy: Secondary | ICD-10-CM | POA: Diagnosis not present

## 2022-06-04 DIAGNOSIS — Z5112 Encounter for antineoplastic immunotherapy: Secondary | ICD-10-CM | POA: Diagnosis not present

## 2022-06-04 LAB — RAD ONC ARIA SESSION SUMMARY
Course Elapsed Days: 30
Plan Fractions Treated to Date: 23
Plan Prescribed Dose Per Fraction: 1.8 Gy
Plan Total Fractions Prescribed: 28
Plan Total Prescribed Dose: 50.4 Gy
Reference Point Dosage Given to Date: 41.4 Gy
Reference Point Session Dosage Given: 1.8 Gy
Session Number: 23

## 2022-06-07 ENCOUNTER — Ambulatory Visit
Admission: RE | Admit: 2022-06-07 | Discharge: 2022-06-07 | Disposition: A | Discharge status: Home / Self Care | Payer: Federal, State, Local not specified - PPO | Source: Ambulatory Visit | Attending: Radiation Oncology | Admitting: Radiation Oncology

## 2022-06-07 ENCOUNTER — Other Ambulatory Visit: Payer: Self-pay

## 2022-06-07 ENCOUNTER — Ambulatory Visit: Payer: Federal, State, Local not specified - PPO

## 2022-06-07 DIAGNOSIS — Z170 Estrogen receptor positive status [ER+]: Secondary | ICD-10-CM | POA: Diagnosis not present

## 2022-06-07 DIAGNOSIS — Z79620 Long term (current) use of immunosuppressive biologic: Secondary | ICD-10-CM | POA: Diagnosis not present

## 2022-06-07 DIAGNOSIS — D6481 Anemia due to antineoplastic chemotherapy: Secondary | ICD-10-CM | POA: Diagnosis not present

## 2022-06-07 DIAGNOSIS — C50312 Malignant neoplasm of lower-inner quadrant of left female breast: Secondary | ICD-10-CM | POA: Diagnosis not present

## 2022-06-07 DIAGNOSIS — Z5112 Encounter for antineoplastic immunotherapy: Secondary | ICD-10-CM | POA: Diagnosis not present

## 2022-06-07 DIAGNOSIS — Z510 Encounter for antineoplastic radiation therapy: Secondary | ICD-10-CM | POA: Diagnosis not present

## 2022-06-07 LAB — RAD ONC ARIA SESSION SUMMARY
Course Elapsed Days: 33
Plan Fractions Treated to Date: 24
Plan Prescribed Dose Per Fraction: 1.8 Gy
Plan Total Fractions Prescribed: 28
Plan Total Prescribed Dose: 50.4 Gy
Reference Point Dosage Given to Date: 43.2 Gy
Reference Point Session Dosage Given: 1.8 Gy
Session Number: 24

## 2022-06-08 ENCOUNTER — Other Ambulatory Visit: Payer: Self-pay

## 2022-06-08 ENCOUNTER — Ambulatory Visit
Admission: RE | Admit: 2022-06-08 | Discharge: 2022-06-08 | Disposition: A | Discharge status: Home / Self Care | Payer: Federal, State, Local not specified - PPO | Source: Ambulatory Visit | Attending: Radiation Oncology | Admitting: Radiation Oncology

## 2022-06-08 ENCOUNTER — Ambulatory Visit: Payer: Federal, State, Local not specified - PPO

## 2022-06-08 DIAGNOSIS — Z79620 Long term (current) use of immunosuppressive biologic: Secondary | ICD-10-CM | POA: Diagnosis not present

## 2022-06-08 DIAGNOSIS — C50312 Malignant neoplasm of lower-inner quadrant of left female breast: Secondary | ICD-10-CM | POA: Diagnosis not present

## 2022-06-08 DIAGNOSIS — Z5112 Encounter for antineoplastic immunotherapy: Secondary | ICD-10-CM | POA: Diagnosis not present

## 2022-06-08 DIAGNOSIS — Z170 Estrogen receptor positive status [ER+]: Secondary | ICD-10-CM | POA: Diagnosis not present

## 2022-06-08 DIAGNOSIS — Z510 Encounter for antineoplastic radiation therapy: Secondary | ICD-10-CM | POA: Diagnosis not present

## 2022-06-08 DIAGNOSIS — D6481 Anemia due to antineoplastic chemotherapy: Secondary | ICD-10-CM | POA: Diagnosis not present

## 2022-06-08 LAB — RAD ONC ARIA SESSION SUMMARY
Course Elapsed Days: 34
Plan Fractions Treated to Date: 25
Plan Prescribed Dose Per Fraction: 1.8 Gy
Plan Total Fractions Prescribed: 28
Plan Total Prescribed Dose: 50.4 Gy
Reference Point Dosage Given to Date: 45 Gy
Reference Point Session Dosage Given: 1.8 Gy
Session Number: 25

## 2022-06-08 IMAGING — CT NM PET TUM IMG INITIAL (PI) SKULL BASE T - THIGH
7 series · 25 of 25 positions shown · non-contrast
Comparison: Breast MRI 08/26/2021.

CLINICAL DATA: Initial treatment strategy for left breast cancer.

EXAM:
NUCLEAR MEDICINE PET SKULL BASE TO THIGH
TECHNIQUE: 10.12 mCi F-18 FDG was injected intravenously. Full-ring PET imaging
was performed from the skull base to thigh after the radiotracer. CT
data was obtained and used for attenuation correction and anatomic
localization.
Fasting blood glucose: 123 mg/dl

[Series 3: pet sk_thigh ac · axial · 5.0mm · 4.07mm/px · z∈[-1530,-682]mm · 6 of 213 slices shown]
[im 1/213]
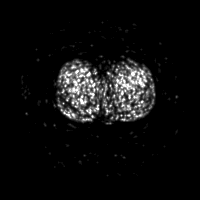
[im 43/213]
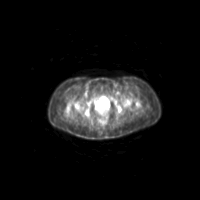
[im 85/213]
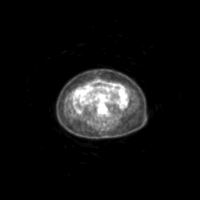
[im 128/213]
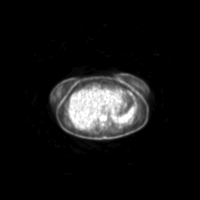
[im 170/213]
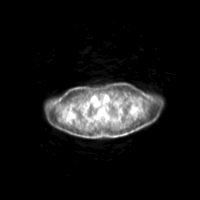
[im 213/213]
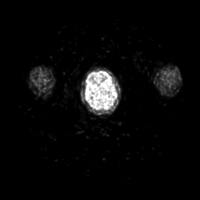

[Series 4: ct sk_thigh 5.0 hd_fov · axial · 5.0mm · 1.52mm/px · z∈[-1530,-682]mm · 5 of 213 slices shown]
[im 1/213]
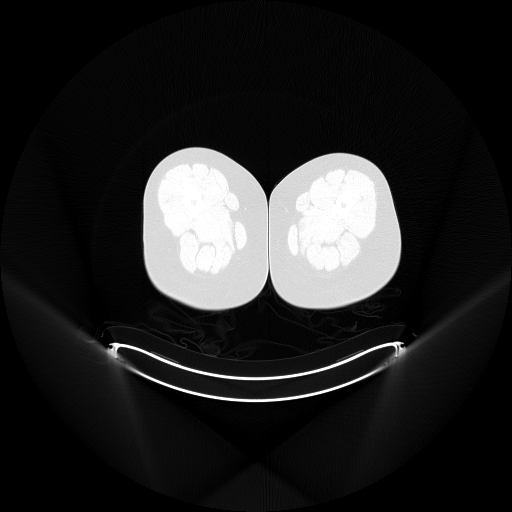
[im 54/213]
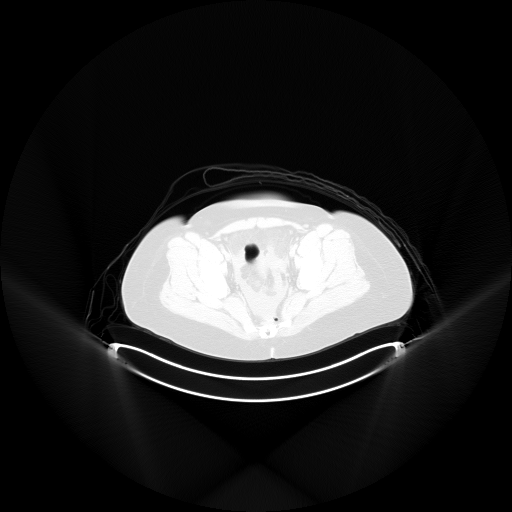
[im 107/213]
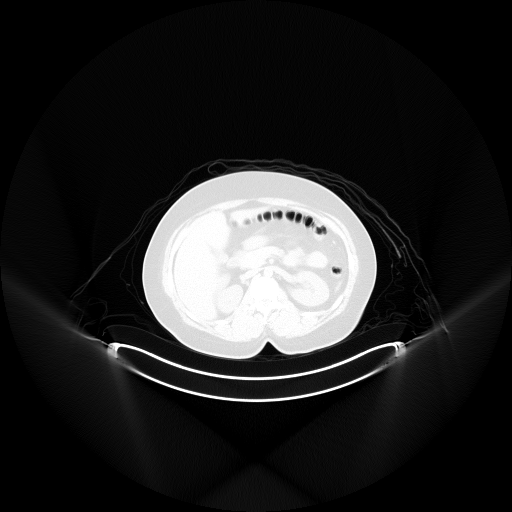
[im 160/213]
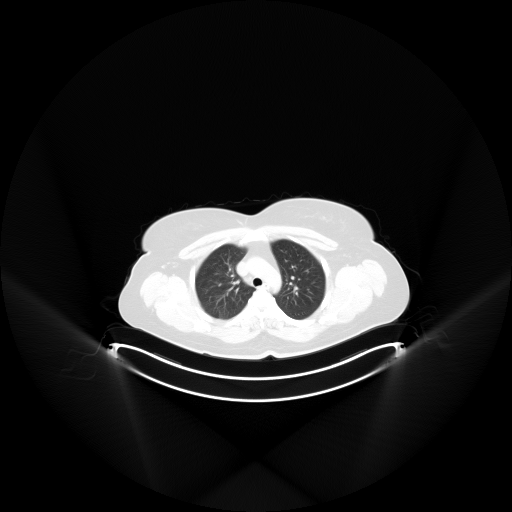
[im 213/213]
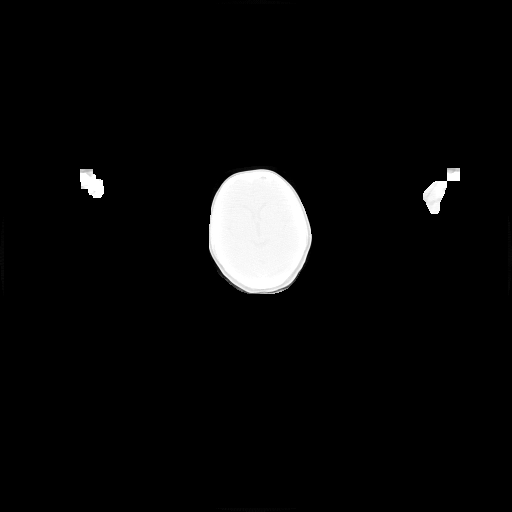

[Series 5: pet sk_thigh nac · axial · 5.0mm · 4.07mm/px · z∈[-1530,-682]mm · 5 of 213 slices shown]
[im 1/213]
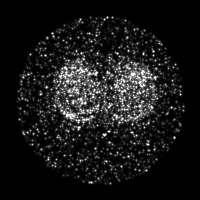
[im 54/213]
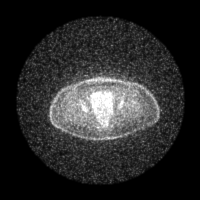
[im 107/213]
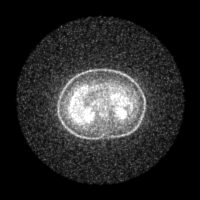
[im 160/213]
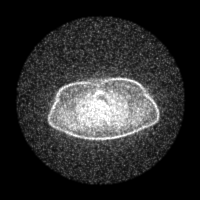
[im 213/213]
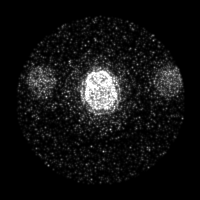

[Series 8: ct sk_thigh 5.0 br59 lung_bone · axial · 5.0mm · 0.77mm/px · z∈[-1088,-812]mm · 2 of 70 slices shown]
[im 1/70]
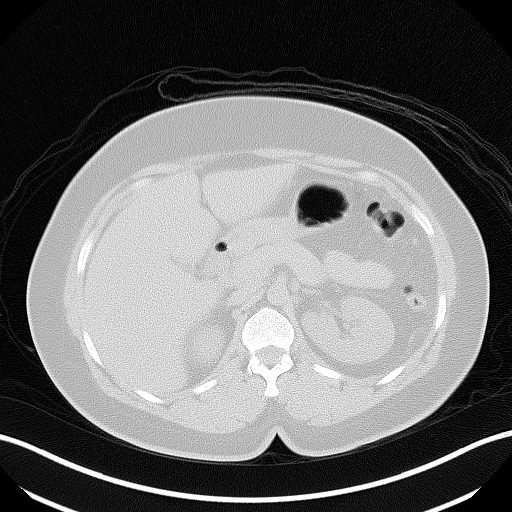
[im 70/70]
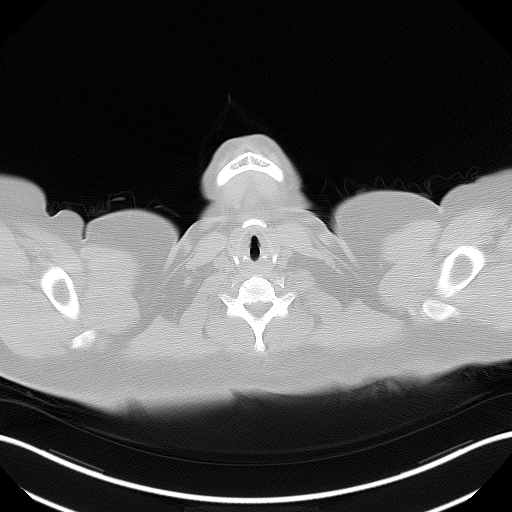

[Series 603: <mip collection> · coronal · 1.76mm/px · 1 of 32 slices shown]
[im 1/32]
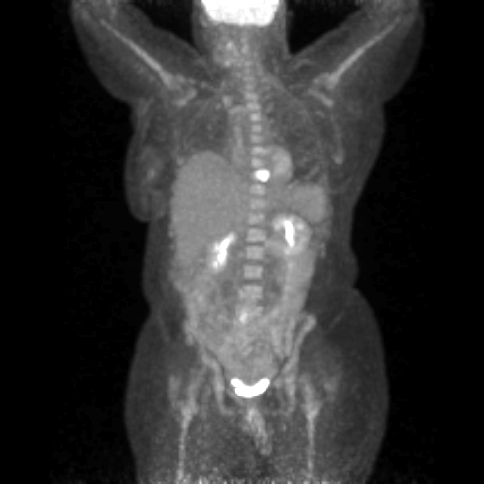

[Series 604: fused cor · 1 of 60 slices shown]
[im 1/60]
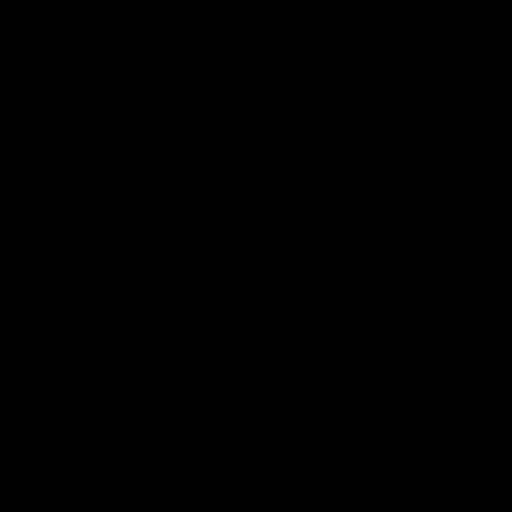

[Series 605: range-ct sk_thigh 5.0 hd_fov-tra-<alpha range> · 5 of 207 slices shown]
[im 1/207]
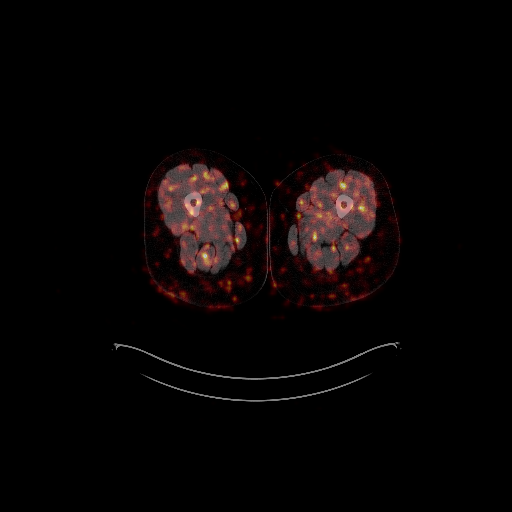
[im 52/207]
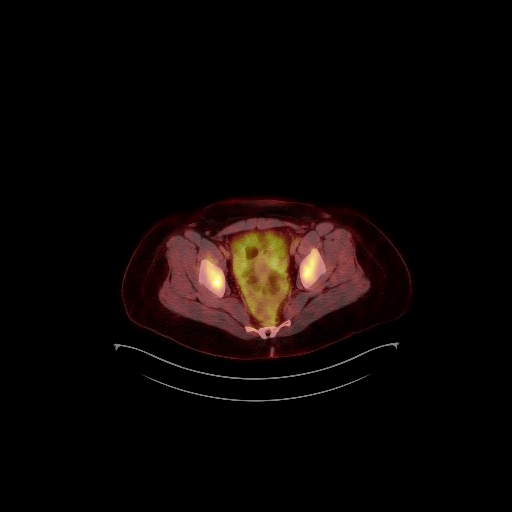
[im 104/207]
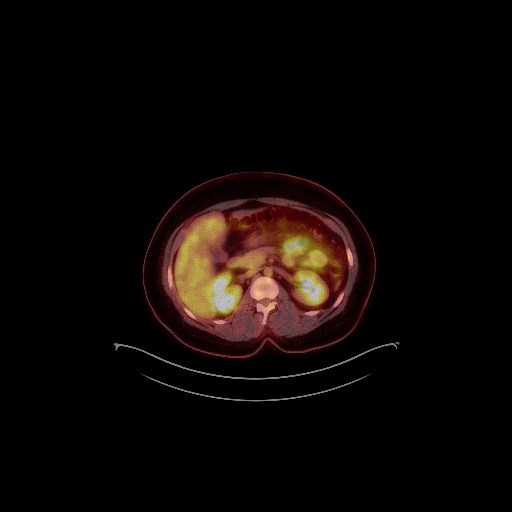
[im 155/207]
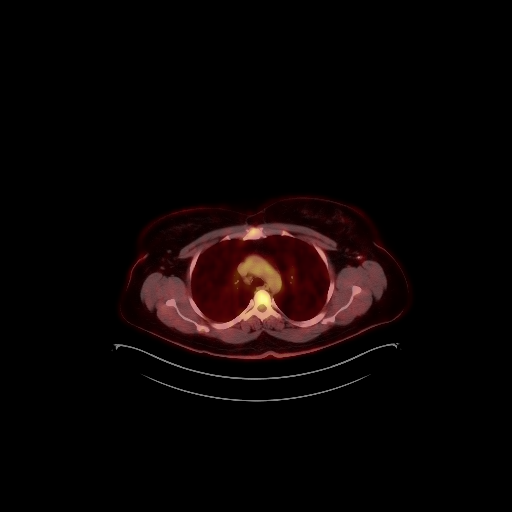
[im 207/207]
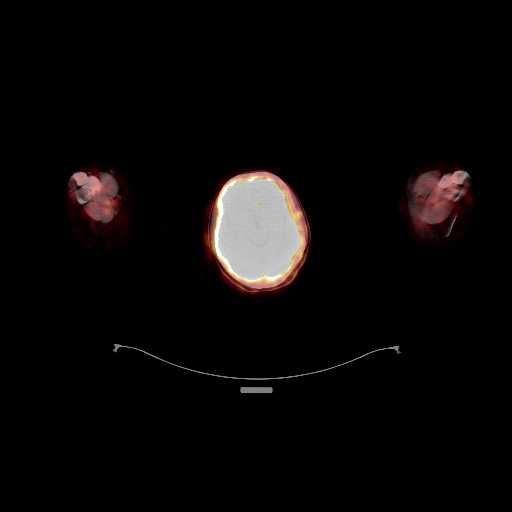

[25 of 25 positions shown; findings below may reference images not displayed]

FINDINGS: Mediastinal blood pool activity: SUV max

NECK:

No hypermetabolic cervical lymph nodes are identified.There are no
lesions of the pharyngeal mucosal space.

Incidental CT findings: none

CHEST:

Known mass medially in the left breast is significantly
hypermetabolic. This mass measures 1.5 x 2.1 cm on image 77/4 and
has an SUV max of 20.3. No other hypermetabolic breast masses are
identified. There are no hypermetabolic mediastinal, hilar, internal
mammary or axillary lymph nodes. Small left axillary lymph nodes are
present with an SUV max of 2.2, similar to blood pool. No
hypermetabolic pulmonary activity or suspicious nodularity.

Incidental CT findings: none

ABDOMEN/PELVIS:

There is no hypermetabolic activity within the liver, adrenal
glands, spleen or pancreas. There is no hypermetabolic nodal
activity. There is suspicion of an inflammatory process involving
the central mesentery and pelvis with ill-defined focal
hypermetabolic activity within the base of the mesentery in the
false pelvis. This corresponds with a 1.6 cm nodular density on
image 142/4 and has an SUV max of 9.4. There is possible associated
wall thickening of the terminal ileum. A normal appearing appendix
is not clearly visualized. There is a moderate amount of free pelvic
fluid which is without significant hypermetabolic activity. There is
no pneumoperitoneum or focal drainable fluid collection.

Incidental CT findings: Mild hepatic steatosis. As above, suspected
acute inflammatory process in the small bowel mesentery and pelvis.

SKELETON:

There is no focal hypermetabolic activity to suggest osseous
metastatic disease. Bone marrow activity is mildly increased
diffusely.

Incidental CT findings: none
IMPRESSION: 1. Known mass medially in the left breast is markedly hypermetabolic
consistent with breast cancer.
2. No evidence of nodal metastases within the chest. No typical
distant metastases.
3. Suspected acute inflammatory process involving central mesentery
and pelvis with ill-defined focal hypermetabolic activity anterior
to the sacrum. There are pelvic inflammatory changes and free pelvic
fluid. Recent labs demonstrate mild leukocytosis. Differential
considerations include acute appendicitis, enteritis and small
perforated foreign body. No evidence of drainable fluid collection,
pneumoperitoneum or bowel obstruction.
4. These results were called by telephone at the time of
interpretation on 08/31/2021 at [DATE] to on-call oncology nurse,
Cela, who verbally acknowledged these results. Unless there is a
good clinical explanation for these findings, evaluation in the
emergency department is likely warranted, and patient may benefit
from abdominopelvic CT with oral and intravenous contrast.

## 2022-06-08 MED ORDER — RADIAPLEXRX EX GEL: Freq: Once | CUTANEOUS | Status: AC

## 2022-06-09 ENCOUNTER — Ambulatory Visit: Payer: Federal, State, Local not specified - PPO

## 2022-06-09 ENCOUNTER — Other Ambulatory Visit: Payer: Self-pay

## 2022-06-09 ENCOUNTER — Ambulatory Visit
Admission: RE | Admit: 2022-06-09 | Discharge: 2022-06-09 | Disposition: A | Discharge status: Home / Self Care | Payer: Federal, State, Local not specified - PPO | Source: Ambulatory Visit | Attending: Radiation Oncology | Admitting: Radiation Oncology

## 2022-06-09 DIAGNOSIS — Z170 Estrogen receptor positive status [ER+]: Secondary | ICD-10-CM | POA: Diagnosis not present

## 2022-06-09 DIAGNOSIS — Z510 Encounter for antineoplastic radiation therapy: Secondary | ICD-10-CM | POA: Diagnosis not present

## 2022-06-09 DIAGNOSIS — C50312 Malignant neoplasm of lower-inner quadrant of left female breast: Secondary | ICD-10-CM | POA: Diagnosis not present

## 2022-06-09 DIAGNOSIS — Z79620 Long term (current) use of immunosuppressive biologic: Secondary | ICD-10-CM | POA: Diagnosis not present

## 2022-06-09 DIAGNOSIS — D6481 Anemia due to antineoplastic chemotherapy: Secondary | ICD-10-CM | POA: Diagnosis not present

## 2022-06-09 DIAGNOSIS — Z5112 Encounter for antineoplastic immunotherapy: Secondary | ICD-10-CM | POA: Diagnosis not present

## 2022-06-09 LAB — RAD ONC ARIA SESSION SUMMARY
Course Elapsed Days: 35
Plan Fractions Treated to Date: 26
Plan Prescribed Dose Per Fraction: 1.8 Gy
Plan Total Fractions Prescribed: 28
Plan Total Prescribed Dose: 50.4 Gy
Reference Point Dosage Given to Date: 46.8 Gy
Reference Point Session Dosage Given: 1.8 Gy
Session Number: 26

## 2022-06-10 ENCOUNTER — Ambulatory Visit
Admission: RE | Admit: 2022-06-10 | Discharge: 2022-06-10 | Disposition: A | Discharge status: Home / Self Care | Payer: Federal, State, Local not specified - PPO | Source: Ambulatory Visit | Attending: Radiation Oncology | Admitting: Radiation Oncology

## 2022-06-10 ENCOUNTER — Other Ambulatory Visit: Payer: Self-pay

## 2022-06-10 ENCOUNTER — Ambulatory Visit: Payer: Federal, State, Local not specified - PPO

## 2022-06-10 DIAGNOSIS — Z170 Estrogen receptor positive status [ER+]: Secondary | ICD-10-CM | POA: Diagnosis not present

## 2022-06-10 DIAGNOSIS — Z79620 Long term (current) use of immunosuppressive biologic: Secondary | ICD-10-CM | POA: Diagnosis not present

## 2022-06-10 DIAGNOSIS — Z5112 Encounter for antineoplastic immunotherapy: Secondary | ICD-10-CM | POA: Diagnosis not present

## 2022-06-10 DIAGNOSIS — C50312 Malignant neoplasm of lower-inner quadrant of left female breast: Secondary | ICD-10-CM | POA: Diagnosis not present

## 2022-06-10 DIAGNOSIS — Z510 Encounter for antineoplastic radiation therapy: Secondary | ICD-10-CM | POA: Diagnosis not present

## 2022-06-10 DIAGNOSIS — D6481 Anemia due to antineoplastic chemotherapy: Secondary | ICD-10-CM | POA: Diagnosis not present

## 2022-06-10 LAB — RAD ONC ARIA SESSION SUMMARY
Course Elapsed Days: 36
Plan Fractions Treated to Date: 27
Plan Prescribed Dose Per Fraction: 1.8 Gy
Plan Total Fractions Prescribed: 28
Plan Total Prescribed Dose: 50.4 Gy
Reference Point Dosage Given to Date: 48.6 Gy
Reference Point Session Dosage Given: 1.8 Gy
Session Number: 27

## 2022-06-11 ENCOUNTER — Ambulatory Visit: Payer: Federal, State, Local not specified - PPO

## 2022-06-11 ENCOUNTER — Encounter: Payer: Self-pay | Admitting: *Deleted

## 2022-06-14 ENCOUNTER — Other Ambulatory Visit: Payer: Self-pay

## 2022-06-14 ENCOUNTER — Ambulatory Visit
Admission: RE | Admit: 2022-06-14 | Discharge: 2022-06-14 | Disposition: A | Discharge status: Home / Self Care | Payer: Federal, State, Local not specified - PPO | Source: Ambulatory Visit | Attending: Radiation Oncology | Admitting: Radiation Oncology

## 2022-06-14 ENCOUNTER — Ambulatory Visit: Payer: Federal, State, Local not specified - PPO

## 2022-06-14 DIAGNOSIS — Z79620 Long term (current) use of immunosuppressive biologic: Secondary | ICD-10-CM | POA: Diagnosis not present

## 2022-06-14 DIAGNOSIS — Z5112 Encounter for antineoplastic immunotherapy: Secondary | ICD-10-CM | POA: Diagnosis not present

## 2022-06-14 DIAGNOSIS — D6481 Anemia due to antineoplastic chemotherapy: Secondary | ICD-10-CM | POA: Diagnosis not present

## 2022-06-14 DIAGNOSIS — C50312 Malignant neoplasm of lower-inner quadrant of left female breast: Secondary | ICD-10-CM | POA: Diagnosis not present

## 2022-06-14 DIAGNOSIS — Z170 Estrogen receptor positive status [ER+]: Secondary | ICD-10-CM | POA: Diagnosis not present

## 2022-06-14 DIAGNOSIS — Z510 Encounter for antineoplastic radiation therapy: Secondary | ICD-10-CM | POA: Diagnosis not present

## 2022-06-14 LAB — RAD ONC ARIA SESSION SUMMARY
Course Elapsed Days: 40
Plan Fractions Treated to Date: 28
Plan Prescribed Dose Per Fraction: 1.8 Gy
Plan Total Fractions Prescribed: 28
Plan Total Prescribed Dose: 50.4 Gy
Reference Point Dosage Given to Date: 50.4 Gy
Reference Point Session Dosage Given: 1.8 Gy
Session Number: 28

## 2022-06-15 ENCOUNTER — Ambulatory Visit
Admission: RE | Admit: 2022-06-15 | Discharge: 2022-06-15 | Disposition: A | Discharge status: Home / Self Care | Payer: Federal, State, Local not specified - PPO | Source: Ambulatory Visit | Attending: Radiation Oncology | Admitting: Radiation Oncology

## 2022-06-15 ENCOUNTER — Other Ambulatory Visit: Payer: Self-pay

## 2022-06-15 ENCOUNTER — Ambulatory Visit: Payer: Federal, State, Local not specified - PPO

## 2022-06-15 DIAGNOSIS — D6481 Anemia due to antineoplastic chemotherapy: Secondary | ICD-10-CM | POA: Diagnosis not present

## 2022-06-15 DIAGNOSIS — C50312 Malignant neoplasm of lower-inner quadrant of left female breast: Secondary | ICD-10-CM

## 2022-06-15 DIAGNOSIS — Z5112 Encounter for antineoplastic immunotherapy: Secondary | ICD-10-CM | POA: Diagnosis not present

## 2022-06-15 DIAGNOSIS — Z510 Encounter for antineoplastic radiation therapy: Secondary | ICD-10-CM | POA: Diagnosis not present

## 2022-06-15 DIAGNOSIS — Z170 Estrogen receptor positive status [ER+]: Secondary | ICD-10-CM | POA: Diagnosis not present

## 2022-06-15 DIAGNOSIS — Z79620 Long term (current) use of immunosuppressive biologic: Secondary | ICD-10-CM | POA: Diagnosis not present

## 2022-06-15 LAB — RAD ONC ARIA SESSION SUMMARY
Course Elapsed Days: 41
Plan Fractions Treated to Date: 1
Plan Prescribed Dose Per Fraction: 2 Gy
Plan Total Fractions Prescribed: 5
Plan Total Prescribed Dose: 10 Gy
Reference Point Dosage Given to Date: 2 Gy
Reference Point Session Dosage Given: 2 Gy
Session Number: 29

## 2022-06-15 MED ORDER — SILVER SULFADIAZINE 1 % EX CREA: TOPICAL_CREAM | Freq: Every day | CUTANEOUS | Status: DC

## 2022-06-16 ENCOUNTER — Other Ambulatory Visit: Payer: Self-pay

## 2022-06-16 ENCOUNTER — Ambulatory Visit
Admission: RE | Admit: 2022-06-16 | Discharge: 2022-06-16 | Disposition: A | Discharge status: Home / Self Care | Payer: Federal, State, Local not specified - PPO | Source: Ambulatory Visit | Attending: Radiation Oncology | Admitting: Radiation Oncology

## 2022-06-16 ENCOUNTER — Inpatient Hospital Stay: Payer: Federal, State, Local not specified - PPO

## 2022-06-16 DIAGNOSIS — Z79899 Other long term (current) drug therapy: Secondary | ICD-10-CM | POA: Insufficient documentation

## 2022-06-16 DIAGNOSIS — C50312 Malignant neoplasm of lower-inner quadrant of left female breast: Secondary | ICD-10-CM | POA: Insufficient documentation

## 2022-06-16 DIAGNOSIS — Z5112 Encounter for antineoplastic immunotherapy: Secondary | ICD-10-CM | POA: Insufficient documentation

## 2022-06-16 DIAGNOSIS — Z510 Encounter for antineoplastic radiation therapy: Secondary | ICD-10-CM | POA: Diagnosis not present

## 2022-06-16 DIAGNOSIS — Z79620 Long term (current) use of immunosuppressive biologic: Secondary | ICD-10-CM | POA: Diagnosis not present

## 2022-06-16 DIAGNOSIS — D6481 Anemia due to antineoplastic chemotherapy: Secondary | ICD-10-CM | POA: Diagnosis not present

## 2022-06-16 DIAGNOSIS — Z170 Estrogen receptor positive status [ER+]: Secondary | ICD-10-CM | POA: Diagnosis not present

## 2022-06-16 LAB — RAD ONC ARIA SESSION SUMMARY
Course Elapsed Days: 42
Plan Fractions Treated to Date: 2
Plan Prescribed Dose Per Fraction: 2 Gy
Plan Total Fractions Prescribed: 5
Plan Total Prescribed Dose: 10 Gy
Reference Point Dosage Given to Date: 4 Gy
Reference Point Session Dosage Given: 2 Gy
Session Number: 30

## 2022-06-16 LAB — TSH: TSH: 2.341 u[IU]/mL (ref 0.350–4.500)

## 2022-06-16 LAB — CBC WITH DIFFERENTIAL (CANCER CENTER ONLY)
Abs Immature Granulocytes: 0.01 10*3/uL (ref 0.00–0.07)
Basophils Absolute: 0 10*3/uL (ref 0.0–0.1)
Basophils Relative: 1 %
Eosinophils Absolute: 0.1 10*3/uL (ref 0.0–0.5)
Eosinophils Relative: 3 %
HCT: 34.9 % — ABNORMAL LOW (ref 36.0–46.0)
Hemoglobin: 11.8 g/dL — ABNORMAL LOW (ref 12.0–15.0)
Immature Granulocytes: 0 %
Lymphocytes Relative: 22 %
Lymphs Abs: 1.1 10*3/uL (ref 0.7–4.0)
MCH: 31.3 pg (ref 26.0–34.0)
MCHC: 33.8 g/dL (ref 30.0–36.0)
MCV: 92.6 fL (ref 80.0–100.0)
Monocytes Absolute: 0.6 10*3/uL (ref 0.1–1.0)
Monocytes Relative: 12 %
Neutro Abs: 3 10*3/uL (ref 1.7–7.7)
Neutrophils Relative %: 62 %
Platelet Count: 249 10*3/uL (ref 150–400)
RBC: 3.77 MIL/uL — ABNORMAL LOW (ref 3.87–5.11)
RDW: 13.1 % (ref 11.5–15.5)
WBC Count: 4.9 10*3/uL (ref 4.0–10.5)
nRBC: 0 % (ref 0.0–0.2)

## 2022-06-16 LAB — CMP (CANCER CENTER ONLY)
ALT: 25 U/L (ref 0–44)
AST: 23 U/L (ref 15–41)
Albumin: 4.5 g/dL (ref 3.5–5.0)
Alkaline Phosphatase: 98 U/L (ref 38–126)
Anion gap: 7 (ref 5–15)
BUN: 14 mg/dL (ref 6–20)
CO2: 31 mmol/L (ref 22–32)
Calcium: 10 mg/dL (ref 8.9–10.3)
Chloride: 101 mmol/L (ref 98–111)
Creatinine: 0.81 mg/dL (ref 0.44–1.00)
GFR, Estimated: >60 mL/min (ref >60)
Glucose, Bld: 92 mg/dL (ref 70–99)
Potassium: 3.9 mmol/L (ref 3.5–5.1)
Sodium: 139 mmol/L (ref 135–145)
Total Bilirubin: 0.6 mg/dL (ref 0.3–1.2)
Total Protein: 8.2 g/dL — ABNORMAL HIGH (ref 6.5–8.1)

## 2022-06-16 LAB — T4, FREE: Free T4: 0.89 ng/dL (ref 0.61–1.12)

## 2022-06-16 IMAGING — DX DG CHEST 1V PORT
1 series · 1 of 1 positions shown · non-contrast
Comparison: 04/10/2013

CLINICAL DATA: Placement of right chest port

EXAM:
PORTABLE CHEST 1 VIEW

[chest ap]
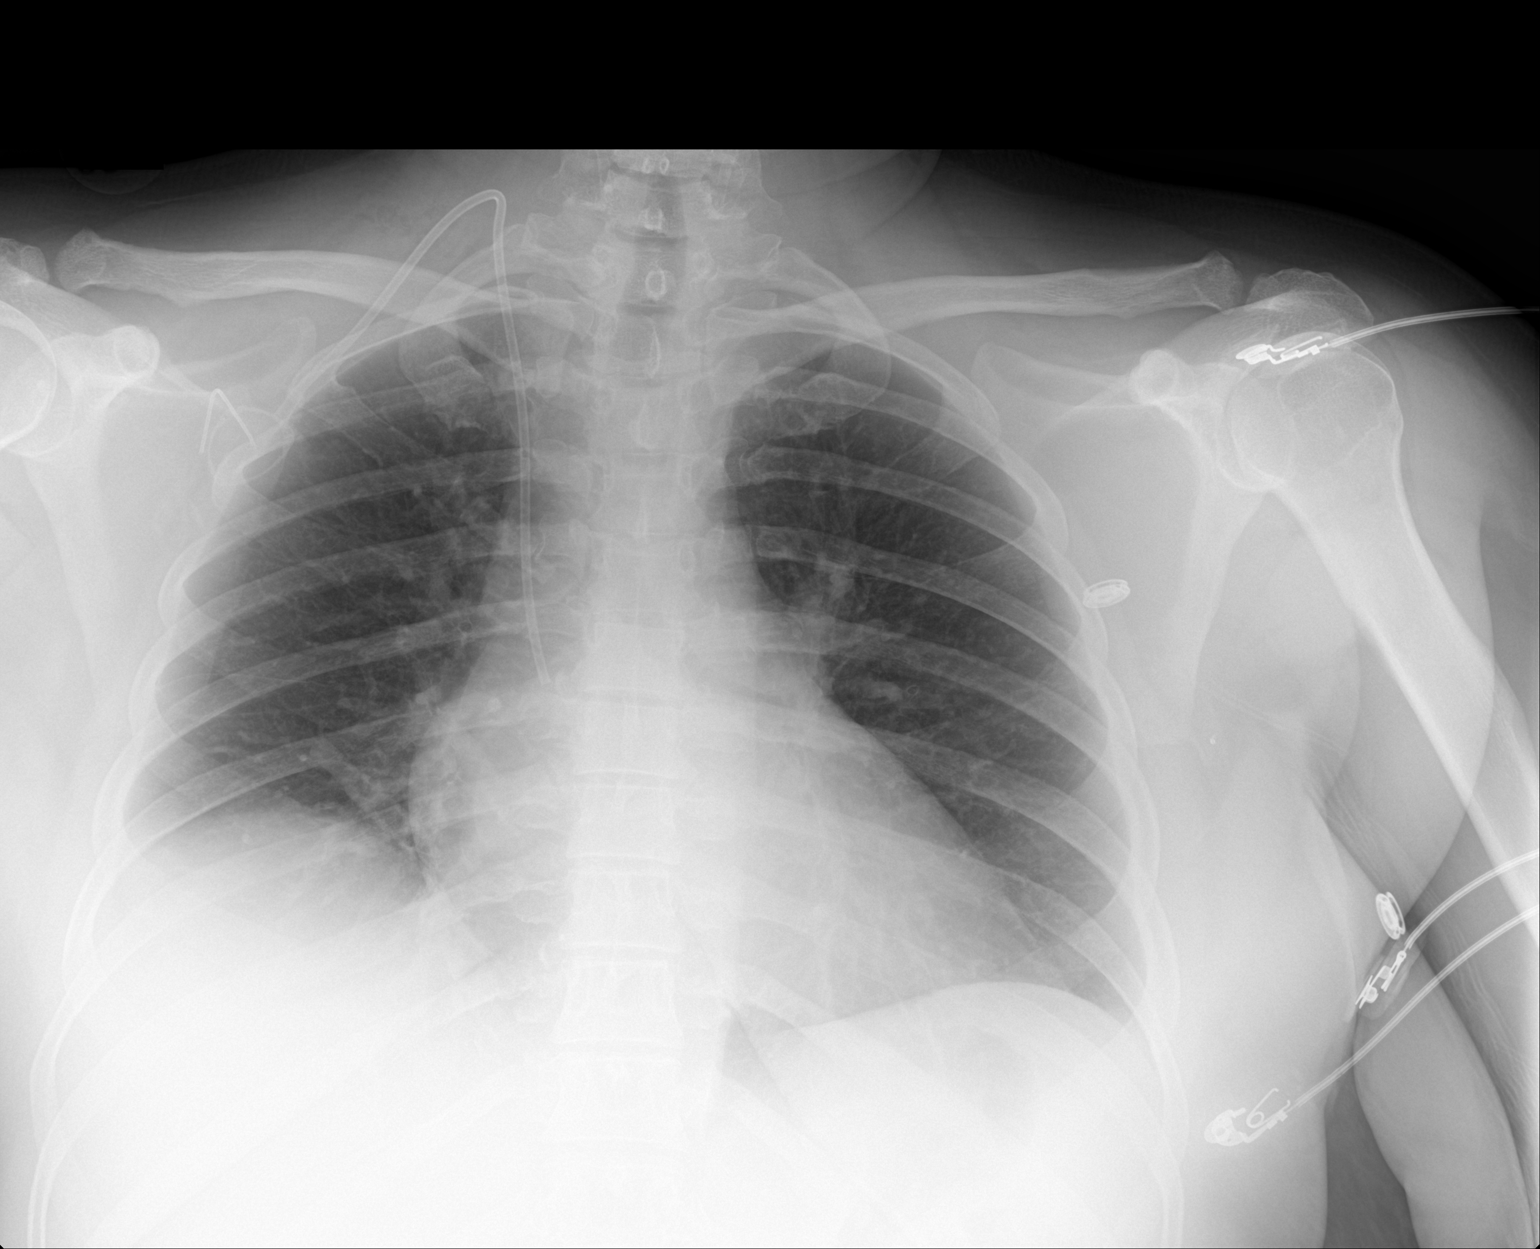

[1 of 1 positions shown; findings below may reference images not displayed]

FINDINGS: Tip of right IJ Port-A-Cath is seen in the superior vena cava. There
is no pneumothorax. Cardiac size is within normal limits. Small
linear densities in the right lower lung fields suggest subsegmental
atelectasis. There is no pleural effusion or pneumothorax.
IMPRESSION: Tip of central venous catheter is seen in the superior vena cava.
There is no pneumothorax. Small linear densities in the right lower
lung fields suggest subsegmental atelectasis.

## 2022-06-17 ENCOUNTER — Inpatient Hospital Stay (HOSPITAL_BASED_OUTPATIENT_CLINIC_OR_DEPARTMENT_OTHER): Payer: Federal, State, Local not specified - PPO | Admitting: Hematology

## 2022-06-17 ENCOUNTER — Other Ambulatory Visit: Payer: Federal, State, Local not specified - PPO

## 2022-06-17 ENCOUNTER — Ambulatory Visit
Admission: RE | Admit: 2022-06-17 | Discharge: 2022-06-17 | Disposition: A | Discharge status: Home / Self Care | Payer: Federal, State, Local not specified - PPO | Source: Ambulatory Visit | Attending: Radiation Oncology | Admitting: Radiation Oncology

## 2022-06-17 ENCOUNTER — Inpatient Hospital Stay: Payer: Federal, State, Local not specified - PPO

## 2022-06-17 ENCOUNTER — Encounter: Payer: Self-pay | Admitting: Hematology

## 2022-06-17 ENCOUNTER — Other Ambulatory Visit: Payer: Self-pay

## 2022-06-17 VITALS — BP 109/65 | HR 84 | Temp 97.8°F | Ht 62.0 in | Wt 186.1 lb

## 2022-06-17 DIAGNOSIS — Z170 Estrogen receptor positive status [ER+]: Secondary | ICD-10-CM | POA: Diagnosis not present

## 2022-06-17 DIAGNOSIS — C50312 Malignant neoplasm of lower-inner quadrant of left female breast: Secondary | ICD-10-CM | POA: Diagnosis not present

## 2022-06-17 DIAGNOSIS — D6481 Anemia due to antineoplastic chemotherapy: Secondary | ICD-10-CM | POA: Diagnosis not present

## 2022-06-17 DIAGNOSIS — Z5112 Encounter for antineoplastic immunotherapy: Secondary | ICD-10-CM | POA: Diagnosis not present

## 2022-06-17 DIAGNOSIS — Z79620 Long term (current) use of immunosuppressive biologic: Secondary | ICD-10-CM | POA: Diagnosis not present

## 2022-06-17 DIAGNOSIS — Z95828 Presence of other vascular implants and grafts: Secondary | ICD-10-CM

## 2022-06-17 DIAGNOSIS — Z510 Encounter for antineoplastic radiation therapy: Secondary | ICD-10-CM | POA: Diagnosis not present

## 2022-06-17 LAB — RAD ONC ARIA SESSION SUMMARY
Course Elapsed Days: 43
Plan Fractions Treated to Date: 3
Plan Prescribed Dose Per Fraction: 2 Gy
Plan Total Fractions Prescribed: 5
Plan Total Prescribed Dose: 10 Gy
Reference Point Dosage Given to Date: 6 Gy
Reference Point Session Dosage Given: 2 Gy
Session Number: 31

## 2022-06-17 LAB — CANCER ANTIGEN 27.29: CA 27.29: 17.1 U/mL (ref 0.0–38.6)

## 2022-06-17 MED ORDER — HEPARIN SOD (PORK) LOCK FLUSH 100 UNIT/ML IV SOLN
500.0000 [IU] | Freq: Once | INTRAVENOUS | Status: AC
  Administered 2022-06-17: 500 [IU]

## 2022-06-17 MED ORDER — SODIUM CHLORIDE 0.9% FLUSH
10.0000 mL | INTRAVENOUS | Status: DC | PRN
  Administered 2022-06-17: 10 mL

## 2022-06-17 MED ORDER — SODIUM CHLORIDE 0.9 % IV SOLN
200.0000 mg | Freq: Once | INTRAVENOUS | Status: AC
  Administered 2022-06-17: 200 mg via INTRAVENOUS
  Filled 2022-06-17: qty 200

## 2022-06-17 MED ORDER — SODIUM CHLORIDE 0.9 % IV SOLN: Freq: Once | INTRAVENOUS | Status: AC

## 2022-06-17 MED ORDER — HEPARIN SOD (PORK) LOCK FLUSH 100 UNIT/ML IV SOLN
500.0000 [IU] | Freq: Once | INTRAVENOUS | Status: AC | PRN
  Administered 2022-06-17: 500 [IU]

## 2022-06-17 MED ORDER — SODIUM CHLORIDE 0.9% FLUSH
10.0000 mL | Freq: Once | INTRAVENOUS | Status: AC
  Administered 2022-06-17: 10 mL

## 2022-06-17 NOTE — Patient Instructions (Signed)
[facility] MEDICAL [facility]COLOGY   Discharge Instructions: Thank you for choosing [facility] to provide your oncology and hematology care.   If you have a lab appointment with the Cancer Center, please go directly to the Cancer Center and check in at the registration area.   Wear comfortable clothing and clothing appropriate for easy access to any Portacath or PICC line.   We strive to give you quality time with your provider. You may need to reschedule your appointment if you arrive late (15 or more minutes).  Arriving late affects you and other patients whose appointments are after yours.  Also, if you miss three or more appointments without notifying the office, you may be dismissed from the clinic at the provider's discretion.      For prescription refill requests, have your pharmacy contact our office and allow 72 hours for refills to be completed.    Today you received the following chemotherapy and/or immunotherapy agents: pembrolizumab      To help prevent nausea and vomiting after your treatment, we encourage you to take your nausea medication as directed.  BELOW ARE SYMPTOMS THAT SHOULD BE REPORTED IMMEDIATELY: *FEVER GREATER THAN 100.4 F (38 C) OR HIGHER *CHILLS OR SWEATING *NAUSEA AND VOMITING THAT IS NOT CONTROLLED WITH YOUR NAUSEA MEDICATION *UNUSUAL SHORTNESS OF BREATH *UNUSUAL BRUISING OR BLEEDING *URINARY PROBLEMS (pain or burning when urinating, or frequent urination) *BOWEL PROBLEMS (unusual diarrhea, constipation, pain near the anus) TENDERNESS IN MOUTH AND THROAT WITH OR WITHOUT PRESENCE OF ULCERS (sore throat, sores in mouth, or a toothache) UNUSUAL RASH, SWELLING OR PAIN  UNUSUAL VAGINAL DISCHARGE OR ITCHING   Items with * indicate a potential emergency and should be followed up as soon as possible or go to the Emergency Department if any problems should occur.  Please show the CHEMOTHERAPY ALERT CARD or IMMUNOTHERAPY ALERT CARD at  check-in to the Emergency Department and triage nurse.  Should you have questions after your visit or need to cancel or reschedule your appointment, please contact [facility]  Dept: [phone]  and follow the prompts.  Office hours are 8:00 a.m. to 4:30 p.m. Monday - Friday. Please note that voicemails left after 4:00 p.m. may not be returned until the following business day.  We are closed weekends and major holidays. You have access to a nurse at all times for urgent questions. Please call the main number to the clinic Dept: [phone] and follow the prompts.   For any non-urgent questions, you may also contact your provider using MyChart. We now offer e-Visits for anyone 18 and older to request care online for non-urgent symptoms. For details visit [email or web address].   Also download the MyChart app! Go to the app store, search "MyChart", open the app, select [facility], and log in with your MyChart username and password.  Masks are optional in the cancer centers. If you would like for your care team to wear a mask while they are taking care of you, please let them know. You may have one support person who is at least 40 years old accompany you for your appointments. 

## 2022-06-17 NOTE — Progress Notes (Signed)
Cheyenne [name]   Telephone:(336) [phone] Fax:(336) [phone]   Clinic Follow up Note   Patient Care Team: Billie Ruddy, MD as PCP - General (Family Medicine) Erroll Luna, MD as Consulting Physician (General Surgery) Truitt Merle, MD as Consulting Physician (Hematology) Gery Pray, MD as Consulting Physician (Radiation Oncology)  Date of Service:  06/17/2022  CHIEF COMPLAINT: f/u of left breast cancer  CURRENT THERAPY:  [name], starting 09/09/21 -adjuvant radiation, 05/05/22 - 06/18/22  ASSESSMENT & PLAN:  Cheyenne [name] is a 41 y.o. premenopausal female with   1. Malignant neoplasm of lower-inner quadrant of left breast, Stage IIIA, c(T2, N1), Functionally triple negative, Grade 3, ypT0N0  -presented with palpable left breast mass. B/l MM and left [place] on 08/07/21 showed: 2.8 cm left breast mass at 7:30; at least 4 abnormal lymph nodes. Biopsy that day confirmed IDC in breast but node was negative. -breast MRI on 08/26/21 and PET on 08/31/21 were negative aside from primary mass.  -port placed 09/08/21  -s/p neoadjuvant carbo/taxol and keytruda on 09/09/21 - 11/25/21; her breast mass became no longer palpable after cycle 3. She then completed Epirubicin/cytoxan 12/02/21 - 02/03/22 (she had allergic reaction to Adriamycin), tolerated moderately well overall. -left lumpectomy 03/16/22 with Dr. Brantley Stage showed no residual carcinoma, indicating complete response. -she will continue Keytruda every 3 weeks to complete a year; plan for final dose on 08/19/22. -she is currently receiving radiation under Dr. Sondra Come, scheduled to finish on 06/20/22. -we discussed surveillance after she finished treatment. I explained that we do not routinely perform whole-body scans in breast patients unless the patient has symptoms. She will continue annual mammogram and physical exams here in the office. Her mammogram is due in 08/2022; I ordered today. -she is also interested in port removal  before the end of the year. I will refer her back to Dr. Brantley Stage. -she is tolerating radiation moderately well with skin burning and some fatigue. She also reports some joint stiffness in her hands, likely from [place]. Labs from yesterday reviewed, hgb improved to 11.8. Potassium is WNL today; I advised her to decrease her KCL to once a day. Overall stable and adequate for Keytruda today.   2. Anemia -secondary to chemo; she has required blood transfusion several times throughout treatment -slowly improving off chemo, hg 11.8 today (06/17/22)     PLAN:  -proceed with Keytruda today -continue daily radiation through 11/19 -lab, flush, f/u, and Keytruda every 3 weeks -mammogram due 08/2022 -will message Dr. Brantley Stage to remove her port next month    No problem-specific Assessment & Plan notes found for this encounter.   SUMMARY OF ONCOLOGIC HISTORY: Oncology History Overview Note   Cancer Staging  Malignant neoplasm of lower-inner quadrant of left breast in female, estrogen receptor positive ([place]) Staging form: Breast, AJCC 8th Edition - Clinical stage from 08/07/2021: Stage IIIA (cT2, cN1, cM0, G3, ER+, PR-, HER2-) - Signed by Truitt Merle, MD on 08/18/2021    Malignant neoplasm of lower-inner quadrant of left breast in female, estrogen receptor positive ([place])  08/07/2021 Cancer Staging   Staging form: Breast, AJCC 8th Edition - Clinical stage from 08/07/2021: Stage IIB (cT2, cN0, cM0, G3, ER-, PR-, HER2-) - Signed by Truitt Merle, MD on 09/24/2021 Stage prefix: Initial diagnosis Histologic grading system: 3 grade system   08/07/2021 Mammogram   CLINICAL DATA:  Palpable mass in the LEFT breast since August.   EXAM: DIGITAL DIAGNOSTIC BILATERAL MAMMOGRAM WITH TOMOSYNTHESIS AND CAD; ULTRASOUND LEFT BREAST LIMITED  IMPRESSION: 1. Suspicious mass in the 7:30 o'clock location of the LEFT breast for which biopsy is recommended. 2. LEFT axillary adenopathy, with at least 4 abnormal lymph nodes.    08/07/2021 Initial Biopsy   Diagnosis 1. Breast, left, needle core biopsy, 7:30, ribbon clip - INVASIVE DUCTAL CARCINOMA - SEE COMMENT 2. Lymph node, needle/core biopsy, left axilla, coil clip - NO CARCINOMA IDENTIFIED Microscopic Comment 1. based on the biopsy, the carcinoma appears Nottingham grade 3 of 3 and measures 1.1 cm in greatest linear extent.  1. PROGNOSTIC INDICATORS Results: The tumor cells are EQUIVOCAL for Her2 (2+). Her2 by FISH will be performed and results reported separately. Estrogen Receptor: 2%, POSITIVE, WEAK STAINING INTENSITY Progesterone Receptor: <1%, NEGATIVE Proliferation Marker Ki67: 40%  1. FLUORESCENCE IN-SITU HYBRIDIZATION Results: GROUP 5: HER2 **NEGATIVE**   08/17/2021 Initial Diagnosis   Malignant neoplasm of lower-inner quadrant of left breast in female, estrogen receptor positive ([place])   08/19/2021 Genetic Testing   [facility] CancerNext-Expanded Panel is Negative. Report date is 09/02/2021.  The CancerNext-Expanded gene panel offered by [facility] and includes sequencing, rearrangement, and RNA analysis for the following 77 genes: AIP, ALK, APC, ATM, AXIN2, BAP1, BARD1, BLM, BMPR1A, BRCA1, BRCA2, BRIP1, CDC73, CDH1, CDK4, CDKN1B, CDKN2A, CHEK2, CTNNA1, DICER1, FANCC, FH, FLCN, GALNT12, KIF1B, LZTR1, MAX, MEN1, MET, MLH1, MSH2, MSH3, MSH6, MUTYH, NBN, NF1, NF2, NTHL1, PALB2, PHOX2B, PMS2, POT1, PRKAR1A, PTCH1, PTEN, RAD51C, RAD51D, RB1, RECQL, RET, SDHA, SDHAF2, SDHB, SDHC, SDHD, SMAD4, SMARCA4, SMARCB1, SMARCE1, STK11, SUFU, TMEM127, TP53, TSC1, TSC2, VHL and XRCC2 (sequencing and deletion/duplication); EGFR, EGLN1, HOXB13, KIT, MITF, PDGFRA, POLD1, and POLE (sequencing only); EPCAM and GREM1 (deletion/duplication only).    08/26/2021 Imaging   EXAM: BILATERAL BREAST MRI WITH AND WITHOUT CONTRAST  IMPRESSION: 2.3 cm mass in the lower-inner quadrant of the left breast corresponding with the biopsy proven invasive ductal carcinoma.   08/31/2021 PET  scan   IMPRESSION: 1. Known mass medially in the left breast is markedly hypermetabolic consistent with breast cancer. 2. No evidence of nodal metastases within the chest. No typical distant metastases. 3. Suspected acute inflammatory process involving central mesentery and pelvis with ill-defined focal hypermetabolic activity anterior to the sacrum. There are pelvic inflammatory changes and free pelvic fluid. Recent labs demonstrate mild leukocytosis. Differential considerations include acute appendicitis, enteritis and small perforated foreign body. No evidence of drainable fluid collection, pneumoperitoneum or bowel obstruction. 4. These results were called by telephone at the time of interpretation on 08/31/2021 at 5:12 pm to on-call oncology nurse, [name], who verbally acknowledged these results. Unless there is a good clinical explanation for these findings, evaluation in the emergency department is likely warranted, and patient may benefit from abdominopelvic CT with oral and intravenous contrast.   09/01/2021 Imaging   EXAM: CT ABDOMEN AND PELVIS WITH CONTRAST  IMPRESSION: Multiple thick-walled loops of distal small bowel, suggesting infectious/inflammatory enteritis. Associated small volume pelvic ascites. No pneumatosis or free air.   No evidence of bowel obstruction.  Normal appendix.   2.1 cm lesion in the medial left breast, likely corresponding to the patient's known primary breast neoplasm. No findings suspicious for metastatic disease.   09/09/2021 - 03/25/2022 Chemotherapy   Patient is on Treatment Plan : BREAST Pembrolizumab (200) D1 + Carboplatin (5) D1 + Paclitaxel (80) D1,8,15 q21d X 4 cycles / Pembrolizumab (200) D1 + AC D1 q21d x 4 cycles     09/09/2021 -  Chemotherapy   Patient is on Treatment Plan : BREAST Pembrolizumab (200) D1 + Carboplatin (  1.5) D1,8,15 + Paclitaxel (80) D1,8,15 q21d X 4 cycles / Pembrolizumab (200) D1 + AC D1 q21d x 4 cycles     03/16/2022  Definitive Surgery   FINAL MICROSCOPIC DIAGNOSIS:   A. LEFT BREAST, LUMPECTOMY:  Negative for residual carcinoma (ypT0)  Changes consistent with neoadjuvant therapy  Changes consistent with prior biopsy   B. LEFT AXILLARY SENTINEL LYMPH NODE, EXCISION:  One benign lymph node, negative for carcinoma (0/1)   C. LEFT AXILLARY SENTINEL LYMPH NODE, EXCISION:  One benign lymph node, negative for carcinoma (0/1)    03/16/2022 Cancer Staging   Staging form: Breast, AJCC 8th Edition - Pathologic stage from 03/16/2022: No Stage Recommended (ypT0, pN0, cM0) - Signed by Truitt Merle, MD on 05/06/2022 Stage prefix: Post-therapy Response to neoadjuvant therapy: Complete response Residual tumor (R): R0 - None      INTERVAL HISTORY:  Cheyenne [name] is here for a follow up of breast cancer. She was last seen by me on 05/06/22. She presents to the clinic accompanied by her wife. She reports she is experiencing skin burns from radiation. She notes some fatigue as well. She also reports some joint stiffness/pain in her hands.   All other systems were reviewed with the patient and are negative.  MEDICAL HISTORY:  Past Medical History:  Diagnosis Date   Anal fissure    Anxiety    Breast cancer ([place]) 08/10/21   Date diagnosis was given   DVT (deep venous thrombosis) ([place])    Incomplete right bundle branch block (RBBB) determined by electrocardiography 09/01/2021    SURGICAL HISTORY: Past Surgical History:  Procedure Laterality Date   BREAST LUMPECTOMY WITH RADIOACTIVE SEED AND SENTINEL LYMPH NODE BIOPSY Left 03/16/2022   Procedure: LEFT BREAST SEED LUMPECTOMY, LEFT SENTINEL [place];  Surgeon: Erroll Luna, MD;  Location: [facility];  Service: General;  Laterality: Left;  GEN & PEC BLOCK   NO PAST SURGERIES     PORTACATH PLACEMENT N/A 09/08/2021   Procedure: INSERTION PORT-A-CATH;  Surgeon: Erroll Luna, MD;  Location: [facility];  Service: General;  Laterality: N/A;     I have reviewed the social history and family history with the patient and they are unchanged from previous note.  ALLERGIES:  has No Known Allergies.  MEDICATIONS:  Current Outpatient Medications  Medication Sig Dispense Refill   acetaminophen (TYLENOL) 500 MG tablet Take 500-1,000 mg by mouth every 6 (six) hours as needed (pain.). (Patient not taking: Reported on 04/14/2022)     albuterol (VENTOLIN HFA) 108 (90 Base) MCG/ACT inhaler Inhale 2 puffs into the lungs every 4 (four) hours as needed for wheezing. 1 each 5   ALPRAZolam (XANAX) 0.5 MG tablet Take one tablet twice daily as needed for anxiety. 45 tablet 2   cyclobenzaprine (FLEXERIL) 10 MG tablet Take 10 mg by mouth daily as needed for muscle spasms. (Patient not taking: Reported on 04/14/2022)     desonide (DESOWEN) 0.05 % cream Apply topically 2 (two) times daily. (Patient taking differently: Apply 1 application  topically 2 (two) times daily as needed (skin irritation.).) 30 g 1   desonide (DESOWEN) 0.05 % lotion Apply 1 application. topically daily as needed (acne).     EPINEPHrine 0.3 mg/0.3 mL IJ SOAJ injection Inject 0.3 mg into the muscle as needed for anaphylaxis. 1 each 0   hydroquinone 4 % cream APPLY TO DARK SPOTS AT BEDTIME 28.35 g 0   ibuprofen (ADVIL) 800 MG tablet Take 1 tablet (800 mg total) by  mouth every 8 (eight) hours as needed. 30 tablet 0   ibuprofen (ADVIL) 800 MG tablet Take 1 tablet (800 mg total) by mouth every 8 (eight) hours as needed. 30 tablet 0   lidocaine-prilocaine (EMLA) cream Apply topically daily.     loratadine (CLARITIN) 10 MG tablet Take 10 mg by mouth daily.     magic mouthwash (nystatin, diphenhydrAMINE, alum & mag hydroxide) suspension mixture Swish and swallow 5 mLs by mouth 3 (three) times daily as needed for mouth pain. (Patient not taking: Reported on 04/14/2022) 140 mL 1   Multiple Vitamins-Minerals (ONE A DAY IMMUNITY DEFENSE) CHEW Chew 1 tablet by mouth daily.     oxyCODONE (OXY  IR/ROXICODONE) 5 MG immediate release tablet Take 1 tablet (5 mg total) by mouth every 6 (six) hours as needed for severe pain. (Patient not taking: Reported on 04/14/2022) 15 tablet 0   potassium chloride SA (KLOR-CON M20) 20 MEQ tablet Take 1 tablet (20 mEq total) by mouth 2 (two) times daily. 60 tablet 0   traMADol (ULTRAM) 50 MG tablet Take 1 tablet (50 mg total) by mouth every 6 (six) hours as needed. (Patient not taking: Reported on 04/14/2022) 5 tablet 0   No current facility-administered medications for this visit.    PHYSICAL EXAMINATION: ECOG PERFORMANCE STATUS: 1 - Symptomatic but completely ambulatory  Vitals:   06/17/22 1224  BP: 109/65  Pulse: 84  Temp: 97.8 F (36.6 C)  SpO2: 100%   Wt Readings from Last 3 Encounters:  06/17/22 186 lb 1.6 oz (84.4 kg)  05/27/22 185 lb 12.8 oz (84.3 kg)  05/06/22 187 lb 14.4 oz (85.2 kg)     GENERAL:alert, no distress and comfortable SKIN: skin color normal, no rashes or significant lesions EYES: normal, Conjunctiva are pink and non-injected, sclera clear  NEURO: alert & oriented x 3 with fluent speech  LABORATORY DATA:  I have reviewed the data as listed    Latest Ref Rng & Units 06/16/2022   11:28 AM 05/27/2022   10:37 AM 05/06/2022    9:41 AM  CBC  WBC 4.0 - 10.5 K/uL 4.9  4.8  6.2   Hemoglobin 12.0 - 15.0 g/dL 11.8  11.8  11.0   Hematocrit 36.0 - 46.0 % 34.9  33.6  32.9   Platelets 150 - 400 K/uL 249  272  273         Latest Ref Rng & Units 06/16/2022   11:28 AM 05/27/2022   10:37 AM 05/06/2022    9:41 AM  CMP  Glucose 70 - 99 mg/dL 92  116  111   BUN 6 - 20 mg/dL _0 Creatinine 0.44 - 1.00 mg/dL 0.81  0.81  0.79   Sodium 135 - 145 mmol/L 139  140  138   Potassium 3.5 - 5.1 mmol/L 3.9  3.4  3.9   Chloride 98 - 111 mmol/L 101  103  103   CO2 22 - 32 mmol/L _1 Calcium 8.9 - 10.3 mg/dL 10.0  9.5  9.5   Total Protein 6.5 - 8.1 g/dL 8.2  7.5  7.7   Total Bilirubin 0.3 - 1.2 mg/dL 0.6  0.4  0.5    Alkaline Phos 38 - 126 U/L 98  85  82   AST 15 - 41 U/L _2 ALT 0 - 44 U/L _3 RADIOGRAPHIC  STUDIES: I have personally reviewed the radiological images as listed and agreed with the findings in the report. No results found.    Orders Placed This Encounter  Procedures   MM DIAG BREAST TOMO BILATERAL    Standing Status:   Future    Standing Expiration Date:   06/18/2023    Order Specific Question:   Reason for Exam (SYMPTOM  OR DIAGNOSIS REQUIRED)    Answer:   screening    Order Specific Question:   Preferred imaging location?    Answer:   [facility]    Order Specific Question:   Is the patient pregnant?    Answer:   No   All questions were answered. The patient knows to call the clinic with any problems, questions or concerns. No barriers to learning was detected. The total time spent in the appointment was 30 minutes.     Truitt Merle, MD 06/17/2022   I, Wilburn Mylar, am acting as scribe for Truitt Merle, MD.   I have reviewed the above documentation for accuracy and completeness, and I agree with the above.

## 2022-06-18 ENCOUNTER — Other Ambulatory Visit: Payer: Self-pay

## 2022-06-18 ENCOUNTER — Ambulatory Visit
Admission: RE | Admit: 2022-06-18 | Discharge: 2022-06-18 | Disposition: A | Discharge status: Home / Self Care | Payer: Federal, State, Local not specified - PPO | Source: Ambulatory Visit | Attending: Radiation Oncology | Admitting: Radiation Oncology

## 2022-06-18 ENCOUNTER — Encounter: Payer: Self-pay | Admitting: *Deleted

## 2022-06-18 ENCOUNTER — Ambulatory Visit: Payer: Federal, State, Local not specified - PPO

## 2022-06-18 ENCOUNTER — Other Ambulatory Visit: Payer: Self-pay | Admitting: Hematology

## 2022-06-18 DIAGNOSIS — Z510 Encounter for antineoplastic radiation therapy: Secondary | ICD-10-CM | POA: Diagnosis not present

## 2022-06-18 DIAGNOSIS — D6481 Anemia due to antineoplastic chemotherapy: Secondary | ICD-10-CM | POA: Diagnosis not present

## 2022-06-18 DIAGNOSIS — Z5112 Encounter for antineoplastic immunotherapy: Secondary | ICD-10-CM | POA: Diagnosis not present

## 2022-06-18 DIAGNOSIS — C50312 Malignant neoplasm of lower-inner quadrant of left female breast: Secondary | ICD-10-CM | POA: Diagnosis not present

## 2022-06-18 DIAGNOSIS — Z170 Estrogen receptor positive status [ER+]: Secondary | ICD-10-CM | POA: Diagnosis not present

## 2022-06-18 DIAGNOSIS — Z79620 Long term (current) use of immunosuppressive biologic: Secondary | ICD-10-CM | POA: Diagnosis not present

## 2022-06-18 LAB — RAD ONC ARIA SESSION SUMMARY
Course Elapsed Days: 44
Plan Fractions Treated to Date: 4
Plan Prescribed Dose Per Fraction: 2 Gy
Plan Total Fractions Prescribed: 5
Plan Total Prescribed Dose: 10 Gy
Reference Point Dosage Given to Date: 8 Gy
Reference Point Session Dosage Given: 2 Gy
Session Number: 32

## 2022-06-19 ENCOUNTER — Other Ambulatory Visit: Payer: Self-pay

## 2022-06-20 ENCOUNTER — Other Ambulatory Visit: Payer: Self-pay

## 2022-06-20 ENCOUNTER — Ambulatory Visit
Admission: RE | Admit: 2022-06-20 | Discharge: 2022-06-20 | Disposition: A | Discharge status: Home / Self Care | Payer: Federal, State, Local not specified - PPO | Source: Ambulatory Visit | Attending: Radiation Oncology | Admitting: Radiation Oncology

## 2022-06-20 ENCOUNTER — Encounter: Payer: Self-pay | Admitting: Radiation Oncology

## 2022-06-20 DIAGNOSIS — Z5112 Encounter for antineoplastic immunotherapy: Secondary | ICD-10-CM | POA: Diagnosis not present

## 2022-06-20 DIAGNOSIS — D6481 Anemia due to antineoplastic chemotherapy: Secondary | ICD-10-CM | POA: Diagnosis not present

## 2022-06-20 DIAGNOSIS — Z79620 Long term (current) use of immunosuppressive biologic: Secondary | ICD-10-CM | POA: Diagnosis not present

## 2022-06-20 DIAGNOSIS — Z510 Encounter for antineoplastic radiation therapy: Secondary | ICD-10-CM | POA: Diagnosis not present

## 2022-06-20 DIAGNOSIS — Z170 Estrogen receptor positive status [ER+]: Secondary | ICD-10-CM | POA: Diagnosis not present

## 2022-06-20 DIAGNOSIS — C50312 Malignant neoplasm of lower-inner quadrant of left female breast: Secondary | ICD-10-CM | POA: Diagnosis not present

## 2022-06-20 LAB — RAD ONC ARIA SESSION SUMMARY
Course Elapsed Days: 46
Plan Fractions Treated to Date: 5
Plan Prescribed Dose Per Fraction: 2 Gy
Plan Total Fractions Prescribed: 5
Plan Total Prescribed Dose: 10 Gy
Reference Point Dosage Given to Date: 10 Gy
Reference Point Session Dosage Given: 2 Gy
Session Number: 33

## 2022-06-21 ENCOUNTER — Ambulatory Visit: Payer: Federal, State, Local not specified - PPO

## 2022-06-28 ENCOUNTER — Ambulatory Visit: Payer: Self-pay | Admitting: Surgery

## 2022-06-28 ENCOUNTER — Ambulatory Visit: Payer: Federal, State, Local not specified - PPO | Attending: Surgery

## 2022-06-28 VITALS — Wt 185.5 lb

## 2022-06-28 DIAGNOSIS — Z483 Aftercare following surgery for neoplasm: Secondary | ICD-10-CM | POA: Insufficient documentation

## 2022-06-28 DIAGNOSIS — Z9889 Other specified postprocedural states: Secondary | ICD-10-CM | POA: Diagnosis not present

## 2022-06-28 DIAGNOSIS — Z95828 Presence of other vascular implants and grafts: Secondary | ICD-10-CM | POA: Diagnosis not present

## 2022-06-28 NOTE — Therapy (Signed)
  OUTPATIENT PHYSICAL THERAPY SOZO SCREENING NOTE   Patient Name: Cheyenne [name] MRN: [id number] DOB:1981-07-08, 41 y.o., female Today's Date: 06/28/2022  PCP: Billie Ruddy, MD REFERRING PROVIDER: Erroll Luna, MD   PT End of Session - 06/28/22 1538     Visit Number 7   # unchanged due to screen only   PT Start Time 1535    PT Stop Time 1540    PT Time Calculation (min) 5 min    Activity Tolerance Patient tolerated treatment well    Behavior During Therapy [facility] for tasks assessed/performed             Past Medical History:  Diagnosis Date   Anal fissure    Anxiety    Breast cancer ([place]) 08/10/21   Date diagnosis was given   DVT (deep venous thrombosis) ([place])    Incomplete right bundle branch block (RBBB) determined by electrocardiography 09/01/2021   Past Surgical History:  Procedure Laterality Date   BREAST LUMPECTOMY WITH RADIOACTIVE SEED AND SENTINEL LYMPH NODE BIOPSY Left 03/16/2022   Procedure: LEFT BREAST SEED LUMPECTOMY, LEFT SENTINEL [place];  Surgeon: Erroll Luna, MD;  Location: [facility];  Service: General;  Laterality: Left;  GEN & PEC BLOCK   NO PAST SURGERIES     PORTACATH PLACEMENT N/A 09/08/2021   Procedure: INSERTION PORT-A-CATH;  Surgeon: Erroll Luna, MD;  Location: [facility];  Service: General;  Laterality: N/A;   Patient Active Problem List   Diagnosis Date Noted   Port-A-Cath in place 09/16/2021   Genetic testing 08/31/2021   Malignant neoplasm of lower-inner quadrant of left breast in female, estrogen receptor positive ([place]) 08/17/2021   Anxiety 08/06/2019   Plantar fasciitis 08/06/2019   Fluid collection (edema) in the arms, legs, hands and feet 05/02/2018    REFERRING DIAG: left breast cancer at risk for lymphedema  THERAPY DIAG:  Aftercare following surgery for neoplasm  PERTINENT HISTORY: Patient was diagnosed on 08/07/2021 with left grade III invasive ductal carcinoma breast cancer. It measures 2.8 cm  and is located in the lower inner quadrant. It is weakly (2%) ER positive, PR and HER2 negative with a Ki67 of 40%. 03/16/22- L breast seed lumpectomy and SLNB 0/2. Had post op seroma with drainage from inframammary incision as well as an additional one inferior to axillary incision  PRECAUTIONS: left UE Lymphedema risk, None  SUBJECTIVE: Pt returns for her first 3 month L-Dex screen.   PAIN:  Are you having pain? No  SOZO SCREENING: Patient was assessed today using the SOZO machine to determine the lymphedema index score. This was compared to her baseline score. It was determined that she is within the recommended range when compared to her baseline and no further action is needed at this time. She will continue SOZO screenings. These are done every 3 months for 2 years post operatively followed by every 6 months for 2 years, and then annually.   L-DEX FLOWSHEETS - 06/28/22 1500       L-DEX LYMPHEDEMA SCREENING   Measurement Type Unilateral    L-DEX MEASUREMENT EXTREMITY Upper Extremity    POSITION  Standing    DOMINANT SIDE Right    At Risk Side Left    BASELINE SCORE (UNILATERAL) 3.5    L-DEX SCORE (UNILATERAL) 3.9    VALUE CHANGE (UNILAT) 0.4              Otelia Limes, PTA 06/28/2022, 3:39 PM

## 2022-06-29 ENCOUNTER — Other Ambulatory Visit: Payer: Self-pay

## 2022-07-02 ENCOUNTER — Encounter: Payer: Self-pay | Admitting: Oncology

## 2022-07-02 ENCOUNTER — Telehealth: Payer: Self-pay | Admitting: Oncology

## 2022-07-02 NOTE — Telephone Encounter (Signed)
Cheyenne [name] called and requested a letter so that she can continue to work from home due to her skin irritation from radiation and having to apply medication throughout the day.  She would like to continue working from home for 6 months if possible.  Advised her that Dr. Sondra Come will be notified and that we will call her on Monday when the letter is ready.

## 2022-07-02 NOTE — Progress Notes (Signed)
   Established Patient Office Visit  Subjective   Patient ID: Cheyenne [name], female    DOB: 1980/11/10  Age: 41 y.o. MRN: [id number]  No chief complaint on file.   HPI    ROS    Objective:     There were no vitals taken for this visit.   Physical Exam   No results found for any visits on 07/02/22.    The 10-year ASCVD risk score (Arnett DK, et al., 2019) is: 0.4%    Assessment & Plan:   Problem List Items Addressed This Visit   None   No follow-ups on file.    Jacqulyn Liner, RN

## 2022-07-06 ENCOUNTER — Encounter: Payer: Self-pay | Admitting: Hematology

## 2022-07-08 ENCOUNTER — Encounter: Payer: Self-pay | Admitting: Radiation Oncology

## 2022-07-09 ENCOUNTER — Encounter: Payer: Self-pay | Admitting: Hematology

## 2022-07-09 ENCOUNTER — Inpatient Hospital Stay: Payer: Federal, State, Local not specified - PPO | Attending: Hematology

## 2022-07-09 ENCOUNTER — Inpatient Hospital Stay: Payer: Federal, State, Local not specified - PPO

## 2022-07-09 VITALS — BP 105/66 | HR 81 | Temp 98.4°F | Resp 16 | Wt 187.0 lb

## 2022-07-09 DIAGNOSIS — Z5112 Encounter for antineoplastic immunotherapy: Secondary | ICD-10-CM | POA: Diagnosis not present

## 2022-07-09 DIAGNOSIS — Z79899 Other long term (current) drug therapy: Secondary | ICD-10-CM | POA: Diagnosis not present

## 2022-07-09 DIAGNOSIS — Z170 Estrogen receptor positive status [ER+]: Secondary | ICD-10-CM

## 2022-07-09 DIAGNOSIS — C50312 Malignant neoplasm of lower-inner quadrant of left female breast: Secondary | ICD-10-CM | POA: Insufficient documentation

## 2022-07-09 DIAGNOSIS — Z95828 Presence of other vascular implants and grafts: Secondary | ICD-10-CM

## 2022-07-09 LAB — CBC WITH DIFFERENTIAL (CANCER CENTER ONLY)
Abs Immature Granulocytes: 0.02 10*3/uL (ref 0.00–0.07)
Basophils Absolute: 0 10*3/uL (ref 0.0–0.1)
Basophils Relative: 1 %
Eosinophils Absolute: 0.1 10*3/uL (ref 0.0–0.5)
Eosinophils Relative: 2 %
HCT: 34 % — ABNORMAL LOW (ref 36.0–46.0)
Hemoglobin: 11.8 g/dL — ABNORMAL LOW (ref 12.0–15.0)
Immature Granulocytes: 0 %
Lymphocytes Relative: 28 %
Lymphs Abs: 1.5 10*3/uL (ref 0.7–4.0)
MCH: 31.7 pg (ref 26.0–34.0)
MCHC: 34.7 g/dL (ref 30.0–36.0)
MCV: 91.4 fL (ref 80.0–100.0)
Monocytes Absolute: 0.5 10*3/uL (ref 0.1–1.0)
Monocytes Relative: 9 %
Neutro Abs: 3.2 10*3/uL (ref 1.7–7.7)
Neutrophils Relative %: 60 %
Platelet Count: 249 10*3/uL (ref 150–400)
RBC: 3.72 MIL/uL — ABNORMAL LOW (ref 3.87–5.11)
RDW: 13.2 % (ref 11.5–15.5)
WBC Count: 5.2 10*3/uL (ref 4.0–10.5)
nRBC: 0 % (ref 0.0–0.2)

## 2022-07-09 LAB — CMP (CANCER CENTER ONLY)
ALT: 23 U/L (ref 0–44)
AST: 21 U/L (ref 15–41)
Albumin: 4.2 g/dL (ref 3.5–5.0)
Alkaline Phosphatase: 97 U/L (ref 38–126)
Anion gap: 6 (ref 5–15)
BUN: 15 mg/dL (ref 6–20)
CO2: 31 mmol/L (ref 22–32)
Calcium: 9.9 mg/dL (ref 8.9–10.3)
Chloride: 102 mmol/L (ref 98–111)
Creatinine: 0.75 mg/dL (ref 0.44–1.00)
GFR, Estimated: >60 mL/min (ref >60)
Glucose, Bld: 92 mg/dL (ref 70–99)
Potassium: 3.7 mmol/L (ref 3.5–5.1)
Sodium: 139 mmol/L (ref 135–145)
Total Bilirubin: 0.5 mg/dL (ref 0.3–1.2)
Total Protein: 7.5 g/dL (ref 6.5–8.1)

## 2022-07-09 MED ORDER — HEPARIN SOD (PORK) LOCK FLUSH 100 UNIT/ML IV SOLN
500.0000 [IU] | Freq: Once | INTRAVENOUS | Status: AC | PRN
  Administered 2022-07-09: 500 [IU]

## 2022-07-09 MED ORDER — SODIUM CHLORIDE 0.9 % IV SOLN
200.0000 mg | Freq: Once | INTRAVENOUS | Status: AC
  Administered 2022-07-09: 200 mg via INTRAVENOUS
  Filled 2022-07-09: qty 200

## 2022-07-09 MED ORDER — SODIUM CHLORIDE 0.9 % IV SOLN: Freq: Once | INTRAVENOUS | Status: AC

## 2022-07-09 MED ORDER — SODIUM CHLORIDE 0.9% FLUSH
10.0000 mL | Freq: Once | INTRAVENOUS | Status: AC
  Administered 2022-07-09: 10 mL

## 2022-07-09 MED ORDER — SODIUM CHLORIDE 0.9% FLUSH
10.0000 mL | INTRAVENOUS | Status: DC | PRN
  Administered 2022-07-09: 10 mL

## 2022-07-09 NOTE — Patient Instructions (Signed)
[facility] MEDICAL [facility]COLOGY  Discharge Instructions: Thank you for choosing [facility] to provide your oncology and hematology care.   If you have a lab appointment with the Cancer Center, please go directly to the Cancer Center and check in at the registration area.   Wear comfortable clothing and clothing appropriate for easy access to any Portacath or PICC line.   We strive to give you quality time with your provider. You may need to reschedule your appointment if you arrive late (15 or more minutes).  Arriving late affects you and other patients whose appointments are after yours.  Also, if you miss three or more appointments without notifying the office, you may be dismissed from the clinic at the provider's discretion.      For prescription refill requests, have your pharmacy contact our office and allow 72 hours for refills to be completed.    Today you received the following chemotherapy and/or immunotherapy agents Pembrolizumab      To help prevent nausea and vomiting after your treatment, we encourage you to take your nausea medication as directed.  BELOW ARE SYMPTOMS THAT SHOULD BE REPORTED IMMEDIATELY: *FEVER GREATER THAN 100.4 F (38 C) OR HIGHER *CHILLS OR SWEATING *NAUSEA AND VOMITING THAT IS NOT CONTROLLED WITH YOUR NAUSEA MEDICATION *UNUSUAL SHORTNESS OF BREATH *UNUSUAL BRUISING OR BLEEDING *URINARY PROBLEMS (pain or burning when urinating, or frequent urination) *BOWEL PROBLEMS (unusual diarrhea, constipation, pain near the anus) TENDERNESS IN MOUTH AND THROAT WITH OR WITHOUT PRESENCE OF ULCERS (sore throat, sores in mouth, or a toothache) UNUSUAL RASH, SWELLING OR PAIN  UNUSUAL VAGINAL DISCHARGE OR ITCHING   Items with * indicate a potential emergency and should be followed up as soon as possible or go to the Emergency Department if any problems should occur.  Please show the CHEMOTHERAPY ALERT CARD or IMMUNOTHERAPY ALERT CARD at check-in  to the Emergency Department and triage nurse.  Should you have questions after your visit or need to cancel or reschedule your appointment, please contact [facility]  Dept: [phone]  and follow the prompts.  Office hours are 8:00 a.m. to 4:30 p.m. Monday - Friday. Please note that voicemails left after 4:00 p.m. may not be returned until the following business day.  We are closed weekends and major holidays. You have access to a nurse at all times for urgent questions. Please call the main number to the clinic Dept: [phone] and follow the prompts.   For any non-urgent questions, you may also contact your provider using MyChart. We now offer e-Visits for anyone 18 and older to request care online for non-urgent symptoms. For details visit [email or web address].   Also download the MyChart app! Go to the app store, search "MyChart", open the app, select [facility], and log in with your MyChart username and password.  Masks are optional in the cancer centers. If you would like for your care team to wear a mask while they are taking care of you, please let them know. You may have one support person who is at least 40 years old accompany you for your appointments. 

## 2022-07-13 ENCOUNTER — Encounter: Payer: Self-pay | Admitting: Hematology

## 2022-07-13 ENCOUNTER — Other Ambulatory Visit: Payer: Self-pay

## 2022-07-13 ENCOUNTER — Encounter (HOSPITAL_BASED_OUTPATIENT_CLINIC_OR_DEPARTMENT_OTHER): Payer: Self-pay | Admitting: Surgery

## 2022-07-13 NOTE — Progress Notes (Signed)
   07/13/22 0956  PAT Phone Screen  Is the patient taking a GLP-1 receptor agonist? No  Do You Have Diabetes? No  Do You Have Hypertension? No  Have You Ever Been to the ER for Asthma? No  Have You Taken Oral Steroids in the Past 3 Months? No  Do you Take Phenteramine or any Other Diet Drugs? No  Recent  Lab Work, EKG, CXR? Yes  Where was this test performed? 07/09/22 [facility] EKG 08/2021  Do you have a history of heart problems? No  Have you ever had tests on your heart? Yes  What cardiac tests were performed? Echo  What date/year were cardiac tests completed? 08/28/21 EF 55-60% no AS  Results viewable: [facility] Media Tab  Any Recent Hospitalizations? No  Height '5\' 2"'$  (1.575 m)  Weight 84.8 kg  Pat Appointment Scheduled No  Reason for No Appointment Not Needed

## 2022-07-14 ENCOUNTER — Encounter: Payer: Self-pay | Admitting: Nurse Practitioner

## 2022-07-15 ENCOUNTER — Encounter: Payer: Self-pay | Admitting: Hematology

## 2022-07-16 NOTE — Progress Notes (Signed)
       Patient Instructions  The night before surgery:  No food after midnight. ONLY clear liquids after midnight  The day of surgery (if you do NOT have diabetes):  Drink ONE (1) Pre-Surgery Clear Ensure as directed.   This drink was given to you during your hospital  pre-op appointment visit. The pre-op nurse will instruct you on the time to drink the  Pre-Surgery Ensure depending on your surgery time. Finish the drink at the designated time by the pre-op nurse.  Nothing else to drink after completing the  Pre-Surgery Clear Ensure.  The day of surgery (if you have diabetes): Drink ONE (1) Gatorade 2 (G2) as directed. This drink was given to you during your hospital  pre-op appointment visit.  The pre-op nurse will instruct you on the time to drink the   Gatorade 2 (G2) depending on your surgery time. Color of the Gatorade may vary. Red is not allowed. Nothing else to drink after completing the  Gatorade 2 (G2).         If you have questions, please contact your surgeon's office.   Patient given [facility] soap with written and verbal instructions with teach back.

## 2022-07-20 ENCOUNTER — Encounter (HOSPITAL_BASED_OUTPATIENT_CLINIC_OR_DEPARTMENT_OTHER): Payer: Self-pay | Admitting: Surgery

## 2022-07-20 ENCOUNTER — Ambulatory Visit (HOSPITAL_BASED_OUTPATIENT_CLINIC_OR_DEPARTMENT_OTHER)
Admission: RE | Admit: 2022-07-20 | Discharge: 2022-07-20 | Disposition: A | Discharge status: Home / Self Care | Payer: Federal, State, Local not specified - PPO | Attending: Surgery | Admitting: Surgery

## 2022-07-20 ENCOUNTER — Other Ambulatory Visit: Payer: Self-pay

## 2022-07-20 ENCOUNTER — Ambulatory Visit (HOSPITAL_BASED_OUTPATIENT_CLINIC_OR_DEPARTMENT_OTHER): Payer: Federal, State, Local not specified - PPO | Admitting: Certified Registered"

## 2022-07-20 ENCOUNTER — Encounter (HOSPITAL_BASED_OUTPATIENT_CLINIC_OR_DEPARTMENT_OTHER)
Admission: RE | Disposition: A | Discharge status: Home / Self Care | Payer: Self-pay | Source: Home / Self Care | Attending: Surgery

## 2022-07-20 DIAGNOSIS — Z923 Personal history of irradiation: Secondary | ICD-10-CM | POA: Insufficient documentation

## 2022-07-20 DIAGNOSIS — Z452 Encounter for adjustment and management of vascular access device: Secondary | ICD-10-CM | POA: Diagnosis not present

## 2022-07-20 DIAGNOSIS — Z170 Estrogen receptor positive status [ER+]: Secondary | ICD-10-CM | POA: Insufficient documentation

## 2022-07-20 DIAGNOSIS — C50312 Malignant neoplasm of lower-inner quadrant of left female breast: Secondary | ICD-10-CM | POA: Insufficient documentation

## 2022-07-20 DIAGNOSIS — F419 Anxiety disorder, unspecified: Secondary | ICD-10-CM | POA: Diagnosis not present

## 2022-07-20 DIAGNOSIS — Z01818 Encounter for other preprocedural examination: Secondary | ICD-10-CM

## 2022-07-20 HISTORY — PX: PORT-A-CATH REMOVAL: SHX5289

## 2022-07-20 LAB — POCT PREGNANCY, URINE: Preg Test, Ur: NEGATIVE

## 2022-07-20 SURGERY — REMOVAL PORT-A-CATH
Anesthesia: Monitor Anesthesia Care | Site: Chest

## 2022-07-20 MED ORDER — ONDANSETRON HCL 4 MG/2ML IJ SOLN
INTRAMUSCULAR | Status: AC
  Filled 2022-07-20: qty 2

## 2022-07-20 MED ORDER — CHLORHEXIDINE GLUCONATE CLOTH 2 % EX PADS: 6.0000 | MEDICATED_PAD | Freq: Once | CUTANEOUS | Status: DC

## 2022-07-20 MED ORDER — LACTATED RINGERS IV SOLN: INTRAVENOUS | Status: DC

## 2022-07-20 MED ORDER — FENTANYL CITRATE (PF) 100 MCG/2ML IJ SOLN
INTRAMUSCULAR | Status: DC | PRN
  Administered 2022-07-20 (×2): 50 ug via INTRAVENOUS

## 2022-07-20 MED ORDER — ACETAMINOPHEN 500 MG PO TABS
1000.0000 mg | ORAL_TABLET | ORAL | Status: AC
  Administered 2022-07-20: 1000 mg via ORAL

## 2022-07-20 MED ORDER — PROPOFOL 500 MG/50ML IV EMUL
INTRAVENOUS | Status: DC | PRN
  Administered 2022-07-20: 20 mg via INTRAVENOUS
  Administered 2022-07-20: 75 ug/kg/min via INTRAVENOUS

## 2022-07-20 MED ORDER — CEFAZOLIN SODIUM-DEXTROSE 2-4 GM/100ML-% IV SOLN
2.0000 g | INTRAVENOUS | Status: AC
  Administered 2022-07-20: 2 g via INTRAVENOUS

## 2022-07-20 MED ORDER — IBUPROFEN 800 MG PO TABS: 800.0000 mg | ORAL_TABLET | Freq: Three times a day (TID) | ORAL | 0 refills | Status: DC | PRN

## 2022-07-20 MED ORDER — BUPIVACAINE HCL (PF) 0.25 % IJ SOLN
INTRAMUSCULAR | Status: DC | PRN
  Administered 2022-07-20: 20 mL

## 2022-07-20 MED ORDER — MIDAZOLAM HCL 2 MG/2ML IJ SOLN
INTRAMUSCULAR | Status: AC
  Filled 2022-07-20: qty 2

## 2022-07-20 MED ORDER — FENTANYL CITRATE (PF) 100 MCG/2ML IJ SOLN: 25.0000 ug | INTRAMUSCULAR | Status: DC | PRN

## 2022-07-20 MED ORDER — CEFAZOLIN SODIUM-DEXTROSE 2-4 GM/100ML-% IV SOLN
INTRAVENOUS | Status: AC
  Filled 2022-07-20: qty 100

## 2022-07-20 MED ORDER — EPHEDRINE 5 MG/ML INJ
INTRAVENOUS | Status: AC
  Filled 2022-07-20: qty 5

## 2022-07-20 MED ORDER — MIDAZOLAM HCL 2 MG/2ML IJ SOLN
INTRAMUSCULAR | Status: DC | PRN
  Administered 2022-07-20: 2 mg via INTRAVENOUS

## 2022-07-20 MED ORDER — PROPOFOL 500 MG/50ML IV EMUL
INTRAVENOUS | Status: AC
  Filled 2022-07-20: qty 50

## 2022-07-20 MED ORDER — ACETAMINOPHEN 500 MG PO TABS: 1000.0000 mg | ORAL_TABLET | Freq: Once | ORAL | Status: DC | PRN

## 2022-07-20 MED ORDER — ACETAMINOPHEN 10 MG/ML IV SOLN: 1000.0000 mg | Freq: Once | INTRAVENOUS | Status: DC | PRN

## 2022-07-20 MED ORDER — ACETAMINOPHEN 160 MG/5ML PO SOLN: 1000.0000 mg | Freq: Once | ORAL | Status: DC | PRN

## 2022-07-20 MED ORDER — ONDANSETRON HCL 4 MG/2ML IJ SOLN
INTRAMUSCULAR | Status: DC | PRN
  Administered 2022-07-20: 4 mg via INTRAVENOUS

## 2022-07-20 MED ORDER — FENTANYL CITRATE (PF) 100 MCG/2ML IJ SOLN
INTRAMUSCULAR | Status: AC
  Filled 2022-07-20: qty 2

## 2022-07-20 MED ORDER — EPHEDRINE SULFATE (PRESSORS) 50 MG/ML IJ SOLN
INTRAMUSCULAR | Status: DC | PRN
  Administered 2022-07-20: 5 mg via INTRAVENOUS

## 2022-07-20 MED ORDER — ACETAMINOPHEN 500 MG PO TABS
ORAL_TABLET | ORAL | Status: AC
  Filled 2022-07-20: qty 2

## 2022-07-20 SURGICAL SUPPLY — 33 items
ADH SKN CLS APL DERMABOND .7 (GAUZE/BANDAGES/DRESSINGS) ×1
APL PRP STRL LF DISP 70% ISPRP (MISCELLANEOUS) ×1
APL SKNCLS STERI-STRIP NONHPOA (GAUZE/BANDAGES/DRESSINGS)
BENZOIN TINCTURE PRP APPL 2/3 (GAUZE/BANDAGES/DRESSINGS) IMPLANT
BLADE SURG 15 STRL LF DISP TIS (BLADE) ×1 IMPLANT
BLADE SURG 15 STRL SS (BLADE) ×1
CHLORAPREP W/TINT 26 (MISCELLANEOUS) ×1 IMPLANT
COVER BACK TABLE 60X90IN (DRAPES) ×1 IMPLANT
COVER MAYO STAND STRL (DRAPES) ×1 IMPLANT
DERMABOND ADVANCED .7 DNX12 (GAUZE/BANDAGES/DRESSINGS) ×1 IMPLANT
DRAPE LAPAROTOMY 100X72 PEDS (DRAPES) ×1 IMPLANT
DRAPE UTILITY XL STRL (DRAPES) ×1 IMPLANT
ELECT REM PT RETURN 9FT ADLT (ELECTROSURGICAL) ×1
ELECTRODE REM PT RTRN 9FT ADLT (ELECTROSURGICAL) ×1 IMPLANT
GLOVE BIOGEL PI IND STRL 8 (GLOVE) ×1 IMPLANT
GLOVE ECLIPSE 8.0 STRL XLNG CF (GLOVE) ×1 IMPLANT
GOWN STRL REUS W/ TWL LRG LVL3 (GOWN DISPOSABLE) ×2 IMPLANT
GOWN STRL REUS W/ TWL XL LVL3 (GOWN DISPOSABLE) ×1 IMPLANT
GOWN STRL REUS W/TWL LRG LVL3 (GOWN DISPOSABLE) ×2
GOWN STRL REUS W/TWL XL LVL3 (GOWN DISPOSABLE) ×1
NDL HYPO 25X1 1.5 SAFETY (NEEDLE) ×1 IMPLANT
NEEDLE HYPO 25X1 1.5 SAFETY (NEEDLE) ×1 IMPLANT
NS IRRIG 1000ML POUR BTL (IV SOLUTION) ×1 IMPLANT
PACK BASIN DAY SURGERY FS (CUSTOM PROCEDURE TRAY) ×1 IMPLANT
PENCIL SMOKE EVACUATOR (MISCELLANEOUS) IMPLANT
SLEEVE SCD COMPRESS KNEE MED (STOCKING) IMPLANT
SPIKE FLUID TRANSFER (MISCELLANEOUS) ×1 IMPLANT
SPONGE T-LAP 4X18 ~~LOC~~+RFID (SPONGE) ×1 IMPLANT
STRIP CLOSURE SKIN 1/2X4 (GAUZE/BANDAGES/DRESSINGS) IMPLANT
SUT MON AB 4-0 PC3 18 (SUTURE) ×1 IMPLANT
SUT VICRYL 3-0 CR8 SH (SUTURE) ×1 IMPLANT
SYR CONTROL 10ML LL (SYRINGE) ×1 IMPLANT
TOWEL GREEN STERILE FF (TOWEL DISPOSABLE) ×1 IMPLANT

## 2022-07-20 NOTE — Interval H&P Note (Signed)
History and Physical Interval Note:  07/20/2022 9:54 AM  Cheyenne [name]  has presented today for surgery, with the diagnosis of PORT IN PLACE.  The various methods of treatment have been discussed with the patient and family. After consideration of risks, benefits and other options for treatment, the patient has consented to  Procedure(s): REMOVAL PORT-A-CATH (N/A) as a surgical intervention.  The patient's history has been reviewed, patient examined, no change in status, stable for surgery.  I have reviewed the patient's chart and labs.  Questions were answered to the patient's satisfaction.     Shattuck

## 2022-07-20 NOTE — Discharge Instructions (Addendum)
#######################################################  GENERAL SURGERY: POST OP INSTRUCTIONS  ######################################################################  EAT Gradually transition to a high fiber diet with a fiber supplement over the next few weeks after discharge.  Start with a pureed / full liquid diet (see below)  WALK Walk an hour a day.  Control your pain to do that.    CONTROL PAIN Control pain so that you can walk, sleep, tolerate sneezing/coughing, go up/down stairs.  HAVE A BOWEL MOVEMENT DAILY Keep your bowels regular to avoid problems.  OK to try a laxative to override constipation.  OK to use an antidairrheal to slow down diarrhea.  Call if not better after 2 tries  CALL IF YOU HAVE PROBLEMS/CONCERNS Call if you are still struggling despite following these instructions. Call if you have concerns not answered by these instructions  ######################################################################    DIET: Follow a light bland diet & liquids the first 24 hours after arrival home, such as soup, liquids, starches, etc.  Be sure to drink plenty of fluids.  Quickly advance to a usual solid diet within a few days.  Avoid fast food or heavy meals as your are more likely to get nauseated or have irregular bowels.  A low-fat, high-fiber diet for the rest of your life is ideal.    Take your usually prescribed home medications unless otherwise directed.  PAIN CONTROL: Pain is best controlled by a usual combination of three different methods TOGETHER: Ice/Heat Over the counter pain medication Prescription pain medication Most patients will experience some swelling and bruising around the incisions.  Ice packs or heating pads (30-60 minutes up to 6 times a day) will help. Use ice for the first few days to help decrease swelling and bruising, then switch to heat to help relax tight/sore spots and speed recovery.  Some people prefer to use ice alone, heat alone,  alternating between ice & heat.  Experiment to what works for you.  Swelling and bruising can take several weeks to resolve.   It is helpful to take an over-the-counter pain medication regularly for the first few weeks.  Choose one of the following that works best for you: Naproxen (Aleve, etc)  Two 220mg tabs twice a day Ibuprofen (Advil, etc) Three 200mg tabs four times a day (every meal & bedtime) Acetaminophen (Tylenol, etc) 500-650mg four times a day (every meal & bedtime) A  prescription for pain medication (such as oxycodone, hydrocodone, etc) should be given to you upon discharge.  Take your pain medication as prescribed.  If you are having problems/concerns with the prescription medicine (does not control pain, nausea, vomiting, rash, itching, etc), please call us [phone] to see if we need to switch you to a different pain medicine that will work better for you and/or control your side effect better. If you need a refill on your pain medication, please contact your pharmacy.  They will contact our office to request authorization. Prescriptions will not be filled after 5 pm or on week-ends.  Avoid getting constipated.  Between the surgery and the pain medications, it is common to experience some constipation.  Increasing fluid intake and taking a fiber supplement (such as Metamucil, Citrucel, FiberCon, MiraLax, etc) 1-2 times a day regularly will usually help prevent this problem from occurring.  A mild laxative (prune juice, Milk of Magnesia, MiraLax, etc) should be taken according to package directions if there are no bowel movements after 48 hours.   Watch out for diarrhea.  If you have many loose bowel movements, simplify your   diet to bland foods & liquids for a few days.  Stop any stool softeners and decrease your fiber supplement.  Switching to mild anti-diarrheal medications (Loperamide/Imodium, Kayopectate, Pepto Bismol) can help.  If this worsens or does not improve, please call  us.  Wash / shower every day.  You may shower over the dressings as they are waterproof.  Continue to shower over incision(s) after the dressing is off. Remove your waterproof bandages 5 days after surgery.  You may leave the incision open to air.  You may have skin tapes (Steri Strips) covering the incision(s).  Leave them on until one week, then remove.  You may replace a dressing/Band-Aid to cover the incision for comfort if you wish.   ACTIVITIES as tolerated:   You may resume regular (light) daily activities beginning the next day--such as daily self-care, walking, climbing stairs--gradually increasing activities as tolerated.  If you can walk 30 minutes without difficulty, it is safe to try more intense activity such as jogging, treadmill, bicycling, low-impact aerobics, swimming, etc. Save the most intensive and strenuous activity for last such as sit-ups, heavy lifting, contact sports, etc  Refrain from any heavy lifting or straining until you are off narcotics for pain control.   DO NOT PUSH THROUGH PAIN.  Let pain be your guide: If it hurts to do something, don't do it.  Pain is your body warning you to avoid that activity for another week until the pain goes down. You may drive when you are no longer taking prescription pain medication, you can comfortably wear a seatbelt, and you can safely maneuver your car and apply brakes. You may have sexual intercourse when it is comfortable.   FOLLOW UP in our office Please call [facility] at [phone] to set up an appointment to see your surgeon in the office for a follow-up appointment approximately 2-3 weeks after your surgery. Make sure that you call for this appointment the day you arrive home to insure a convenient appointment time.  9. IF YOU HAVE DISABILITY OR FAMILY LEAVE FORMS, BRING THEM TO THE OFFICE FOR PROCESSING.  DO NOT GIVE THEM TO YOUR DOCTOR.   WHEN TO CALL us [phone]: Poor pain control Reactions / problems with new  medications (rash/itching, nausea, etc)  Fever over 101.5 F (38.5 C) Worsening swelling or bruising Continued bleeding from incision. Increased pain, redness, or drainage from the incision Difficulty breathing / swallowing   The clinic staff is available to answer your questions during regular business hours (8:30am-5pm).  Please don't hesitate to call and ask to speak to one of our nurses for clinical concerns.   If you have a medical emergency, go to the nearest emergency room or call 911.  A surgeon from [facility] is always on call at the [facility], [place], [place], [place], [place]  [id number] ? MAIN: [phone] ? TOLL FREE: [phone] ?  FAX [phone] [email or web address]  #######################################################   May take Tylenol after 2:40pm if needed.  Post Anesthesia Home Care Instructions  Activity: Get plenty of rest for the remainder of the day. A responsible individual must stay with you for 24 hours following the procedure.  For the next 24 hours, DO NOT: -Drive a car -Paediatric nurse -Drink alcoholic beverages -Take any medication unless instructed by your physician -Make any legal decisions or sign important papers.  Meals: Start with liquid foods such as gelatin or soup. Progress to regular foods  as tolerated. Avoid greasy, spicy, heavy foods. If nausea and/or vomiting occur, drink only clear liquids until the nausea and/or vomiting subsides. Call your physician if vomiting continues.  Special Instructions/Symptoms: Your throat may feel dry or sore from the anesthesia or the breathing tube placed in your throat during surgery. If this causes discomfort, gargle with warm salt water. The discomfort should disappear within 24 hours.

## 2022-07-20 NOTE — Transfer of Care (Signed)
Immediate Anesthesia Transfer of Care Note  Patient: Cheyenne [name]  Procedure(s) Performed: REMOVAL PORT-A-CATH (Chest)  Patient Location: PACU  Anesthesia Type:MAC  Level of Consciousness: awake, alert , and oriented  Airway & Oxygen Therapy: Patient Spontanous Breathing and Patient connected to face mask oxygen  Post-op Assessment: Report given to RN and Post -op Vital signs reviewed and stable  Post vital signs: Reviewed and stable  Last Vitals:   BP 93/70 (79) Vitals Value Taken Time  BP    Temp    Pulse 91 07/20/22 1052  Resp 18 07/20/22 1052  SpO2 100 % 07/20/22 1052  Vitals shown include unvalidated device data.  Last Pain:  Vitals:   07/20/22 0838  TempSrc: Oral  PainSc: 5       Patients Stated Pain Goal: 7 (94/70/96 2836)  Complications: No notable events documented.

## 2022-07-20 NOTE — Anesthesia Preprocedure Evaluation (Addendum)
Anesthesia Evaluation  Patient identified by MRN, date of birth, ID band Patient awake    Reviewed: Allergy & Precautions, H&P , NPO status , Patient's Chart, lab work & pertinent test results  History of Anesthesia Complications Negative for: history of anesthetic complications  Airway Mallampati: III  TM Distance: >3 FB Neck ROM: Full    Dental  (+) Teeth Intact, Dental Advisory Given   Pulmonary neg pulmonary ROS   breath sounds clear to auscultation       Cardiovascular negative cardio ROS  Rhythm:Regular     Neuro/Psych  PSYCHIATRIC DISORDERS Anxiety     negative neurological ROS     GI/Hepatic negative GI ROS, Neg liver ROS,,,  Endo/Other  negative endocrine ROS    Renal/GU negative Renal ROS     Musculoskeletal negative musculoskeletal ROS (+)    Abdominal   Peds  Hematology negative hematology ROS (+)   Anesthesia Other Findings   Reproductive/Obstetrics                             Anesthesia Physical Anesthesia Plan  ASA: 2  Anesthesia Plan: MAC   Post-op Pain Management: Tylenol PO (pre-op)*   Induction: Intravenous  PONV Risk Score and Plan: 2 and Propofol infusion and Treatment may vary due to age or medical condition  Airway Management Planned: Nasal Cannula, Natural Airway and Simple Face Mask  Additional Equipment: None  Intra-op Plan:   Post-operative Plan:   Informed Consent: I have reviewed the patients History and Physical, chart, labs and discussed the procedure including the risks, benefits and alternatives for the proposed anesthesia with the patient or authorized representative who has indicated his/her understanding and acceptance.     Dental advisory given  Plan Discussed with: CRNA  Anesthesia Plan Comments:        Anesthesia Quick Evaluation

## 2022-07-20 NOTE — H&P (Signed)
Chief Complaint: Post Operative Visit   History of Present Illness: Cheyenne [name] is a 41 y.o. female who is seen today for long-term follow-up left breast cancer. She has finished radiation therapy. She is done with chemotherapy and desires to have her port removed. She has no complaints. The open area in her left breast is healed well and she is recovering nicely for radiation therapy..    Review of Systems: A complete review of systems was obtained from the patient. I have reviewed this information and discussed as appropriate with the patient. See HPI as well for other ROS.    Medical History: Past Medical History: Diagnosis Date Anxiety DVT (deep venous thrombosis) (CMS-HCC) History of cancer  Patient Active Problem List Diagnosis Anxiety Fluid collection (edema) in the arms, legs, hands and feet Genetic testing Malignant neoplasm of lower-inner quadrant of left breast in female, estrogen receptor positive Plantar fasciitis Port-A-Cath in place  Past Surgical History: Procedure Laterality Date Breast Surgery Left 03/16/2022   No Known Allergies  Current Outpatient Medications on File Prior to Visit Medication Sig Dispense Refill ALPRAZolam (XANAX) 0.5 MG tablet Take 0.5 mg by mouth 2 (two) times daily as needed ALYACEN 1/35, 28, 1-35 mg-mcg tablet TAKE 1 TABLET BY MOUTH DAILY CONTINUOUSLY (NO PLACEBO TALBETS) (Patient not taking: Reported on 06/28/2022)  No current facility-administered medications on file prior to visit.  Family History Problem Relation Age of Onset Stroke Mother Obesity Mother Diabetes Mother Obesity Sister Diabetes Sister   Social History  Tobacco Use Smoking Status Never Smokeless Tobacco Never   Social History  Socioeconomic History Marital status: Single Tobacco Use Smoking status: Never Smokeless tobacco: Never Substance and Sexual Activity Alcohol use: Yes Drug use: Not Currently  Objective:  There were no vitals  filed for this visit. There is no height or weight on file to calculate BMI.    Port noted right subclavian region  Left breast shows postradiation changes. The incision along the inframammary fold is healing well with some scarring but no signs of open infection. There is some radiation change noted in desquamation which is minimal and dry.  Assessment and Plan:  Diagnoses and all orders for this visit:  Post-operative state  Port-A-Cath in place    Plan to remove port  Overall she is stable  Wounds of healed and she is recovering nicely from her radiation therapy.  No follow-ups on file.

## 2022-07-20 NOTE — Op Note (Signed)
Preop diagnosis: Indwelling port a catheter for chemotherapy  Postop diagnosis: Same  Procedure: Removal of port a catheter  Surgeon: Kenzel Ruesch M.D.  Anesthesia: MAC with local  EBL: Minimal  Specimen none  Drains: None  Indications for procedure: The patient presents for removal of port a catheter after completing chemotherapy. The patient no longer requires central venous access. Risks of bleeding, infection, catheter fragmentation, embolization, arrhythmias and damage to arteries, veins and nerves and possibly other mediastinal structures discussed. The patient agrees to proceed.  Description of procedure: The patient was seen in the holding area. Questions were answered. The patient agreed to proceed. The patient was taken to the operating room. The patient was placed supine. Anesthesia was initiated. The skin on the upper chest was prepped and draped in a sterile fashion. Timeout was done. The patient received preoperative antibiotics. Incision was made through the old port site and the hub of the Port-A-Cath was seen. The sutures were cut to release the port from the chest wall. The catheter was removed in its entirety without difficulty. The tract was closed with 3-0 Vicryl. 4 Monocryl was used to close the skin. All final counts were correct. The patient was taken to recovery in satisfactory condition.  

## 2022-07-20 NOTE — Anesthesia Postprocedure Evaluation (Signed)
Anesthesia Post Note  Patient: Cheyenne [name]  Procedure(s) Performed: REMOVAL PORT-A-CATH (Chest)     Patient location during evaluation: PACU Anesthesia Type: MAC Level of consciousness: awake and alert Pain management: pain level controlled Vital Signs Assessment: post-procedure vital signs reviewed and stable Respiratory status: spontaneous breathing, nonlabored ventilation and respiratory function stable Cardiovascular status: stable and blood pressure returned to baseline Postop Assessment: no apparent nausea or vomiting Anesthetic complications: no  No notable events documented.  Last Vitals:  Vitals:   07/20/22 1115 07/20/22 1122  BP: 90/64 93/60  Pulse: 82 79  Resp: (!) 8 16  Temp:  36.5 C  SpO2: 99% 100%    Last Pain:  Vitals:   07/20/22 1122  TempSrc: Temporal  PainSc: 0-No pain                 Jaiveer Panas

## 2022-07-21 ENCOUNTER — Encounter: Payer: Self-pay | Admitting: Radiation Oncology

## 2022-07-21 NOTE — Progress Notes (Signed)
Radiation Oncology         [phone] ________________________________  Name: Cheyenne [name] MRN: [id number]  Date: 07/22/2022  DOB: 02-Jan-1981  Follow-Up Visit Note  CC: Billie Ruddy, MD  Billie Ruddy, MD  No diagnosis found.  Diagnosis: No residual carcinoma s/p neoadjuvant chemotherapy and left breast conserving surgery   Stage IIB (cT2, cN0, cM0) Left Breast LIQ, Invasive Ductal Carcinoma, ER+ / PR- / Her2-, Grade 3, Functionally triple negative, Grade 3   Cancer Staging  Malignant neoplasm of lower-inner quadrant of left breast in female, estrogen receptor positive ([place]) Staging form: Breast, AJCC 8th Edition - Clinical stage from 08/07/2021: Stage IIB (cT2, cN0, cM0, G3, ER-, PR-, HER2-) - Signed by Truitt Merle, MD on 09/24/2021 - Pathologic stage from 03/16/2022: No Stage Recommended (ypT0, pN0, cM0) - Signed by Truitt Merle, MD on 05/06/2022  Interval Since Last Radiation: 1 month and 2 days   Intent: Curative  Radiation Treatment Dates: 05/05/2022 through 06/20/2022 Site Technique Total Dose (Gy) Dose per Fx (Gy) Completed Fx Beam Energies  Breast, Left: Breast_L 3D 50.4/50.4 1.8 28/28 10XFFF  Breast, Left: Breast_L_Bst 3D 10/10 2 5/5 6X, 10X    Narrative:  The patient returns today for routine follow-up. The patient tolerated radiation therapy relatively well. On the date of her final treatment, the patient reported fatigue and peeling underneath the left breast. Physical exam performed on the date of her final treatment showed desquamation, greatest in axilla, which started to heal with her boost treatment, and in the inframammary region with some moist desquamation.    In the interval, the patient has continued on Keytruda under Dr. Burr Medico. She is tolerating [place] well other than some stifness in her hands. Per her most recent follow up visit with Dr. Burr Medico on 06/17/22, her anemia from chemotherapy continues to slowly improve (Hg on 06/17/22 at 11.8). She is  scheduled to complete Keytruda around the end of January.   The patient also opted for port removal. She was able to have it removed by Dr. Brantley Stage on 07/20/22.            ***                   Allergies:  has No Known Allergies.  Meds: Current Outpatient Medications  Medication Sig Dispense Refill   acetaminophen (TYLENOL) 500 MG tablet Take 500-1,000 mg by mouth every 6 (six) hours as needed (pain.).     albuterol (VENTOLIN HFA) 108 (90 Base) MCG/ACT inhaler Inhale 2 puffs into the lungs every 4 (four) hours as needed for wheezing. 1 each 5   ALPRAZolam (XANAX) 0.5 MG tablet Take one tablet twice daily as needed for anxiety. 45 tablet 2   cyclobenzaprine (FLEXERIL) 10 MG tablet Take 10 mg by mouth daily as needed for muscle spasms. (Patient not taking: Reported on 04/14/2022)     desonide (DESOWEN) 0.05 % cream Apply topically 2 (two) times daily. (Patient taking differently: Apply 1 application  topically 2 (two) times daily as needed (skin irritation.).) 30 g 1   desonide (DESOWEN) 0.05 % lotion Apply 1 application. topically daily as needed (acne).     EPINEPHrine 0.3 mg/0.3 mL IJ SOAJ injection Inject 0.3 mg into the muscle as needed for anaphylaxis. 1 each 0   hydroquinone 4 % cream APPLY TO DARK SPOTS AT BEDTIME 28.35 g 0   ibuprofen (ADVIL) 800 MG tablet Take 1 tablet (800 mg total) by mouth every 8 (eight) hours as  needed. 30 tablet 0   ibuprofen (ADVIL) 800 MG tablet Take 1 tablet (800 mg total) by mouth every 8 (eight) hours as needed. 30 tablet 0   KLOR-CON M20 20 MEQ tablet TAKE 1 TABLET BY MOUTH TWICE A DAY 180 tablet 1   lidocaine-prilocaine (EMLA) cream Apply topically daily.     loratadine (CLARITIN) 10 MG tablet Take 10 mg by mouth daily.     magic mouthwash (nystatin, diphenhydrAMINE, alum & mag hydroxide) suspension mixture Swish and swallow 5 mLs by mouth 3 (three) times daily as needed for mouth pain. (Patient not taking: Reported on 04/14/2022) 140 mL 1   Multiple  Vitamins-Minerals (ONE A DAY IMMUNITY DEFENSE) CHEW Chew 1 tablet by mouth daily.     oxyCODONE (OXY IR/ROXICODONE) 5 MG immediate release tablet Take 1 tablet (5 mg total) by mouth every 6 (six) hours as needed for severe pain. (Patient not taking: Reported on 04/14/2022) 15 tablet 0   traMADol (ULTRAM) 50 MG tablet Take 1 tablet (50 mg total) by mouth every 6 (six) hours as needed. (Patient not taking: Reported on 04/14/2022) 5 tablet 0   No current facility-administered medications for this encounter.    Physical Findings: The patient is in no acute distress. Patient is alert and oriented.  vitals were not taken for this visit. .  No significant changes. Lungs are clear to auscultation bilaterally. Heart has regular rate and rhythm. No palpable cervical, supraclavicular, or axillary adenopathy. Abdomen soft, non-tender, normal bowel sounds.  Right Breast: no palpable mass, nipple discharge or bleeding. Left Breast: ***  Lab Findings: Lab Results  Component Value Date   WBC 5.2 07/09/2022   HGB 11.8 (L) 07/09/2022   HCT 34.0 (L) 07/09/2022   MCV 91.4 07/09/2022   PLT 249 07/09/2022    Radiographic Findings: No results found.  Impression: No residual carcinoma s/p neoadjuvant chemotherapy and left breast conserving surgery   Stage IIB (cT2, cN0, cM0) Left Breast LIQ, Invasive Ductal Carcinoma, ER+ / PR- / Her2-, Grade 3, Functionally triple negative, Grade 3  The patient is recovering from the effects of radiation.  ***  Plan:  ***   *** minutes of total time was spent for this patient encounter, including preparation, face-to-face counseling with the patient and coordination of care, physical exam, and documentation of the encounter. ____________________________________  Blair Promise, PhD, MD  This document serves as a record of services personally performed by Gery Pray, MD. It was created on his behalf by Roney Mans, a trained medical scribe. The creation of this  record is based on the scribe's personal observations and the provider's statements to them. This document has been checked and approved by the attending provider.

## 2022-07-21 NOTE — Progress Notes (Incomplete)
  Radiation Oncology         [phone] ________________________________  Patient Name: Cheyenne [name] MRN: [id number] DOB: 10-09-1980 Referring Physician: Grier Mitts Date of Service: 06/20/2022 [facility], [place]                                                        End Of Treatment Note  Diagnoses: C50.312-Malignant neoplasm of lower-inner quadrant of left female breast  Cancer Staging:  No residual carcinoma s/p neoadjuvant chemotherapy and left breast conserving surgery   Stage IIB (cT2, cN0, cM0) Left Breast LIQ, Invasive Ductal Carcinoma, ER+ / PR- / Her2-, Grade 3, Functionally triple negative, Grade 3   Cancer Staging  Malignant neoplasm of lower-inner quadrant of left breast in female, estrogen receptor positive ([place]) Staging form: Breast, AJCC 8th Edition - Clinical stage from 08/07/2021: Stage IIB (cT2, cN0, cM0, G3, ER-, PR-, HER2-) - Signed by Truitt Merle, MD on 09/24/2021 - Pathologic stage from 03/16/2022: No Stage Recommended (ypT0, pN0, cM0) - Signed by Truitt Merle, MD on 05/06/2022  Intent: Curative  Radiation Treatment Dates: 05/05/2022 through 06/20/2022 Site Technique Total Dose (Gy) Dose per Fx (Gy) Completed Fx Beam Energies  Breast, Left: Breast_L 3D 50.4/50.4 1.8 28/28 10XFFF  Breast, Left: Breast_L_Bst 3D 10/10 2 5/5 6X, 10X   Narrative: The patient tolerated radiation therapy relatively well. On the date of her final treatment, the patient reported fatigue and peeling underneath the left breast. Physical exam performed on the date of her final treatment showed desquamation, greatest in axilla, which started to heal with her boost treatment, and in the inframammary region with some moist desquamation.   Plan: The patient will follow-up with radiation oncology in one month .  ________________________________________________ -----------------------------------  Blair Promise, PhD, MD  This document serves as a record of services  personally performed by Gery Pray, MD. It was created on his behalf by Roney Mans, a trained medical scribe. The creation of this record is based on the scribe's personal observations and the provider's statements to them. This document has been checked and approved by the attending provider.

## 2022-07-22 ENCOUNTER — Ambulatory Visit
Admission: RE | Admit: 2022-07-22 | Discharge: 2022-07-22 | Disposition: A | Discharge status: Home / Self Care | Payer: Federal, State, Local not specified - PPO | Source: Ambulatory Visit | Attending: Radiation Oncology | Admitting: Radiation Oncology

## 2022-07-22 ENCOUNTER — Encounter: Payer: Self-pay | Admitting: Radiation Oncology

## 2022-07-22 VITALS — BP 127/80 | HR 90 | Temp 97.0°F | Resp 18 | Ht 62.0 in | Wt 186.4 lb

## 2022-07-22 DIAGNOSIS — Z79899 Other long term (current) drug therapy: Secondary | ICD-10-CM | POA: Insufficient documentation

## 2022-07-22 DIAGNOSIS — Z170 Estrogen receptor positive status [ER+]: Secondary | ICD-10-CM | POA: Diagnosis not present

## 2022-07-22 DIAGNOSIS — C50312 Malignant neoplasm of lower-inner quadrant of left female breast: Secondary | ICD-10-CM | POA: Diagnosis not present

## 2022-07-22 DIAGNOSIS — Z923 Personal history of irradiation: Secondary | ICD-10-CM | POA: Diagnosis not present

## 2022-07-22 HISTORY — DX: Personal history of irradiation: Z92.3

## 2022-07-22 NOTE — Progress Notes (Signed)
Cheyenne [name] is here today for follow up post radiation to the breast.   Breast Side:Left   They completed their radiation on: 06/20/22   Does the patient complain of any of the following: Post radiation skin issues:  Mild hyperpigmentation.  Breast Tenderness: Yes Breast Swelling: No [place]: No Range of Motion limitations: Full, however patient reports pulling.  Fatigue post radiation: No Appetite good/fair/poor: Good  Additional comments if applicable:    BP 835/07 (BP Location: Left Arm, Patient Position: Sitting)   Pulse 90   Temp (!) 97 F (36.1 C) (Temporal)   Resp 18   Ht '5\' 2"'$  (1.575 m)   Wt 186 lb 6 oz (84.5 kg)   SpO2 100%   BMI 34.09 kg/m

## 2022-07-23 DIAGNOSIS — C50312 Malignant neoplasm of lower-inner quadrant of left female breast: Secondary | ICD-10-CM | POA: Diagnosis not present

## 2022-07-28 DIAGNOSIS — C50312 Malignant neoplasm of lower-inner quadrant of left female breast: Secondary | ICD-10-CM | POA: Diagnosis not present

## 2022-07-29 ENCOUNTER — Telehealth: Payer: Self-pay | Admitting: Hematology

## 2022-07-29 NOTE — Telephone Encounter (Signed)
Called patient due to changes made to her upcoming appointment per 12/28 secure chat. Left voicemail.

## 2022-07-30 ENCOUNTER — Other Ambulatory Visit: Payer: Federal, State, Local not specified - PPO

## 2022-07-30 ENCOUNTER — Inpatient Hospital Stay: Payer: Federal, State, Local not specified - PPO

## 2022-07-30 VITALS — BP 104/65 | HR 82 | Temp 98.4°F | Resp 17 | Wt 187.0 lb

## 2022-07-30 DIAGNOSIS — Z5112 Encounter for antineoplastic immunotherapy: Secondary | ICD-10-CM | POA: Diagnosis not present

## 2022-07-30 DIAGNOSIS — Z170 Estrogen receptor positive status [ER+]: Secondary | ICD-10-CM

## 2022-07-30 DIAGNOSIS — C50312 Malignant neoplasm of lower-inner quadrant of left female breast: Secondary | ICD-10-CM | POA: Diagnosis not present

## 2022-07-30 DIAGNOSIS — Z79899 Other long term (current) drug therapy: Secondary | ICD-10-CM | POA: Diagnosis not present

## 2022-07-30 LAB — CMP (CANCER CENTER ONLY)
ALT: 17 U/L (ref 0–44)
AST: 18 U/L (ref 15–41)
Albumin: 4 g/dL (ref 3.5–5.0)
Alkaline Phosphatase: 85 U/L (ref 38–126)
Anion gap: 10 (ref 5–15)
BUN: 19 mg/dL (ref 6–20)
CO2: 25 mmol/L (ref 22–32)
Calcium: 9.7 mg/dL (ref 8.9–10.3)
Chloride: 103 mmol/L (ref 98–111)
Creatinine: 0.78 mg/dL (ref 0.44–1.00)
GFR, Estimated: >60 mL/min (ref >60)
Glucose, Bld: 146 mg/dL — ABNORMAL HIGH (ref 70–99)
Potassium: 3.4 mmol/L — ABNORMAL LOW (ref 3.5–5.1)
Sodium: 138 mmol/L (ref 135–145)
Total Bilirubin: 0.5 mg/dL (ref 0.3–1.2)
Total Protein: 7.6 g/dL (ref 6.5–8.1)

## 2022-07-30 LAB — CBC WITH DIFFERENTIAL (CANCER CENTER ONLY)
Abs Immature Granulocytes: 0.02 10*3/uL (ref 0.00–0.07)
Basophils Absolute: 0 10*3/uL (ref 0.0–0.1)
Basophils Relative: 1 %
Eosinophils Absolute: 0.2 10*3/uL (ref 0.0–0.5)
Eosinophils Relative: 3 %
HCT: 34 % — ABNORMAL LOW (ref 36.0–46.0)
Hemoglobin: 11.9 g/dL — ABNORMAL LOW (ref 12.0–15.0)
Immature Granulocytes: 0 %
Lymphocytes Relative: 28 %
Lymphs Abs: 1.6 10*3/uL (ref 0.7–4.0)
MCH: 31.8 pg (ref 26.0–34.0)
MCHC: 35 g/dL (ref 30.0–36.0)
MCV: 90.9 fL (ref 80.0–100.0)
Monocytes Absolute: 0.4 10*3/uL (ref 0.1–1.0)
Monocytes Relative: 8 %
Neutro Abs: 3.5 10*3/uL (ref 1.7–7.7)
Neutrophils Relative %: 60 %
Platelet Count: 245 10*3/uL (ref 150–400)
RBC: 3.74 MIL/uL — ABNORMAL LOW (ref 3.87–5.11)
RDW: 13.1 % (ref 11.5–15.5)
WBC Count: 5.8 10*3/uL (ref 4.0–10.5)
nRBC: 0 % (ref 0.0–0.2)

## 2022-07-30 LAB — T4, FREE: Free T4: 0.92 ng/dL (ref 0.61–1.12)

## 2022-07-30 LAB — TSH: TSH: 2.424 u[IU]/mL (ref 0.350–4.500)

## 2022-07-30 MED ORDER — SODIUM CHLORIDE 0.9 % IV SOLN
200.0000 mg | Freq: Once | INTRAVENOUS | Status: AC
  Administered 2022-07-30: 200 mg via INTRAVENOUS
  Filled 2022-07-30: qty 200

## 2022-07-30 MED ORDER — SODIUM CHLORIDE 0.9 % IV SOLN: Freq: Once | INTRAVENOUS | Status: AC

## 2022-07-30 NOTE — Patient Instructions (Signed)
[facility]  Discharge Instructions: Thank you for choosing [facility] to provide your oncology and hematology care.   If you have a lab appointment with the Cancer Center, please go directly to the Cancer Center and check in at the registration area.   Wear comfortable clothing and clothing appropriate for easy access to any Portacath or PICC line.   We strive to give you quality time with your provider. You may need to reschedule your appointment if you arrive late (15 or more minutes).  Arriving late affects you and other patients whose appointments are after yours.  Also, if you miss three or more appointments without notifying the office, you may be dismissed from the clinic at the provider's discretion.      For prescription refill requests, have your pharmacy contact our office and allow 72 hours for refills to be completed.    Today you received the following chemotherapy and/or immunotherapy agents Pembrolizumab      To help prevent nausea and vomiting after your treatment, we encourage you to take your nausea medication as directed.  BELOW ARE SYMPTOMS THAT SHOULD BE REPORTED IMMEDIATELY: *FEVER GREATER THAN 100.4 F (38 C) OR HIGHER *CHILLS OR SWEATING *NAUSEA AND VOMITING THAT IS NOT CONTROLLED WITH YOUR NAUSEA MEDICATION *UNUSUAL SHORTNESS OF BREATH *UNUSUAL BRUISING OR BLEEDING *URINARY PROBLEMS (pain or burning when urinating, or frequent urination) *BOWEL PROBLEMS (unusual diarrhea, constipation, pain near the anus) TENDERNESS IN MOUTH AND THROAT WITH OR WITHOUT PRESENCE OF ULCERS (sore throat, sores in mouth, or a toothache) UNUSUAL RASH, SWELLING OR PAIN  UNUSUAL VAGINAL DISCHARGE OR ITCHING   Items with * indicate a potential emergency and should be followed up as soon as possible or go to the Emergency Department if any problems should occur.  Please show the CHEMOTHERAPY ALERT CARD or IMMUNOTHERAPY ALERT CARD at check-in  to the Emergency Department and triage nurse.  Should you have questions after your visit or need to cancel or reschedule your appointment, please contact [facility]  Dept: [phone]  and follow the prompts.  Office hours are 8:00 a.m. to 4:30 p.m. Monday - Friday. Please note that voicemails left after 4:00 p.m. may not be returned until the following business day.  We are closed weekends and major holidays. You have access to a nurse at all times for urgent questions. Please call the main number to the clinic Dept: [phone] and follow the prompts.   For any non-urgent questions, you may also contact your provider using MyChart. We now offer e-Visits for anyone 18 and older to request care online for non-urgent symptoms. For details visit [email or web address].   Also download the MyChart app! Go to the app store, search "MyChart", open the app, select [facility], and log in with your MyChart username and password.  Masks are optional in the cancer centers. If you would like for your care team to wear a mask while they are taking care of you, please let them know. You may have one support person who is at least 40 years old accompany you for your appointments. 

## 2022-07-31 LAB — CANCER ANTIGEN 27.29: CA 27.29: 12.2 U/mL (ref 0.0–38.6)

## 2022-08-04 ENCOUNTER — Telehealth: Payer: Self-pay

## 2022-08-04 NOTE — Telephone Encounter (Signed)
Faxed over signed orders as per Dr. Burr Medico for Mastectomy supplies

## 2022-08-12 ENCOUNTER — Telehealth: Payer: Self-pay

## 2022-08-12 NOTE — Telephone Encounter (Signed)
Faxed DME orders signed back to [facility] for patient supplies.

## 2022-08-19 NOTE — Progress Notes (Deleted)
[place]   [phone] [phone]   Clinic Follow up Note   Patient Care Team: Billie Ruddy, MD as PCP - General (Family Medicine) Erroll Luna, MD as Consulting Physician (General Surgery) Truitt Merle, MD as Consulting Physician (Hematology) Gery Pray, MD as Consulting Physician (Radiation Oncology)  Date of Service:  08/19/2022  CHIEF COMPLAINT: f/u of  left breast cancer   CURRENT THERAPY:  [name], starting 09/09/21   ASSESSMENT: *** Cheyenne [name] is a 42 y.o. female with   No problem-specific Assessment & Plan notes found for this encounter.  ***   PLAN: {Everything Dr. Burr Medico talks to pt about, including reviewing scans and labs. } -{proceed with ***} -{lab with/without flush and f/u when?}   SUMMARY OF ONCOLOGIC HISTORY: Oncology History Overview Note   Cancer Staging  Malignant neoplasm of lower-inner quadrant of left breast in female, estrogen receptor positive ([place]) Staging form: Breast, AJCC 8th Edition - Clinical stage from 08/07/2021: Stage IIIA (cT2, cN1, cM0, G3, ER+, PR-, HER2-) - Signed by Truitt Merle, MD on 08/18/2021    Malignant neoplasm of lower-inner quadrant of left breast in female, estrogen receptor positive ([place])  08/07/2021 Cancer Staging   Staging form: Breast, AJCC 8th Edition - Clinical stage from 08/07/2021: Stage IIB (cT2, cN0, cM0, G3, ER-, PR-, HER2-) - Signed by Truitt Merle, MD on 09/24/2021 Stage prefix: Initial diagnosis Histologic grading system: 3 grade system   08/07/2021 Mammogram   CLINICAL DATA:  Palpable mass in the LEFT breast since August.   EXAM: DIGITAL DIAGNOSTIC BILATERAL MAMMOGRAM WITH TOMOSYNTHESIS AND CAD; ULTRASOUND LEFT BREAST LIMITED  IMPRESSION: 1. Suspicious mass in the 7:30 o'clock location of the LEFT breast for which biopsy is recommended. 2. LEFT axillary adenopathy, with at least 4 abnormal lymph nodes.   08/07/2021 Initial Biopsy   Diagnosis 1. Breast,  left, needle core biopsy, 7:30, ribbon clip - INVASIVE DUCTAL CARCINOMA - SEE COMMENT 2. Lymph node, needle/core biopsy, left axilla, coil clip - NO CARCINOMA IDENTIFIED Microscopic Comment 1. based on the biopsy, the carcinoma appears Nottingham grade 3 of 3 and measures 1.1 cm in greatest linear extent.  1. PROGNOSTIC INDICATORS Results: The tumor cells are EQUIVOCAL for Her2 (2+). Her2 by FISH will be performed and results reported separately. Estrogen Receptor: 2%, POSITIVE, WEAK STAINING INTENSITY Progesterone Receptor: <1%, NEGATIVE Proliferation Marker Ki67: 40%  1. FLUORESCENCE IN-SITU HYBRIDIZATION Results: GROUP 5: HER2 **NEGATIVE**   08/17/2021 Initial Diagnosis   Malignant neoplasm of lower-inner quadrant of left breast in female, estrogen receptor positive ([place])   08/19/2021 Genetic Testing   [facility] CancerNext-Expanded Panel is Negative. Report date is 09/02/2021.  The CancerNext-Expanded gene panel offered by [facility] and includes sequencing, rearrangement, and RNA analysis for the following 77 genes: AIP, ALK, APC, ATM, AXIN2, BAP1, BARD1, BLM, BMPR1A, BRCA1, BRCA2, BRIP1, CDC73, CDH1, CDK4, CDKN1B, CDKN2A, CHEK2, CTNNA1, DICER1, FANCC, FH, FLCN, GALNT12, KIF1B, LZTR1, MAX, MEN1, MET, MLH1, MSH2, MSH3, MSH6, MUTYH, NBN, NF1, NF2, NTHL1, PALB2, PHOX2B, PMS2, POT1, PRKAR1A, PTCH1, PTEN, RAD51C, RAD51D, RB1, RECQL, RET, SDHA, SDHAF2, SDHB, SDHC, SDHD, SMAD4, SMARCA4, SMARCB1, SMARCE1, STK11, SUFU, TMEM127, TP53, TSC1, TSC2, VHL and XRCC2 (sequencing and deletion/duplication); EGFR, EGLN1, HOXB13, KIT, MITF, PDGFRA, POLD1, and POLE (sequencing only); EPCAM and GREM1 (deletion/duplication only).    08/26/2021 Imaging   EXAM: BILATERAL BREAST MRI WITH AND WITHOUT CONTRAST  IMPRESSION: 2.3 cm mass in the lower-inner quadrant of the left breast corresponding with the biopsy proven invasive ductal carcinoma.  08/31/2021 PET scan   IMPRESSION: 1. Known mass medially in the  left breast is markedly hypermetabolic consistent with breast cancer. 2. No evidence of nodal metastases within the chest. No typical distant metastases. 3. Suspected acute inflammatory process involving central mesentery and pelvis with ill-defined focal hypermetabolic activity anterior to the sacrum. There are pelvic inflammatory changes and free pelvic fluid. Recent labs demonstrate mild leukocytosis. Differential considerations include acute appendicitis, enteritis and small perforated foreign body. No evidence of drainable fluid collection, pneumoperitoneum or bowel obstruction. 4. These results were called by telephone at the time of interpretation on 08/31/2021 at 5:12 pm to on-call oncology nurse, [name], who verbally acknowledged these results. Unless there is a good clinical explanation for these findings, evaluation in the emergency department is likely warranted, and patient may benefit from abdominopelvic CT with oral and intravenous contrast.   09/01/2021 Imaging   EXAM: CT ABDOMEN AND PELVIS WITH CONTRAST  IMPRESSION: Multiple thick-walled loops of distal small bowel, suggesting infectious/inflammatory enteritis. Associated small volume pelvic ascites. No pneumatosis or free air.   No evidence of bowel obstruction.  Normal appendix.   2.1 cm lesion in the medial left breast, likely corresponding to the patient's known primary breast neoplasm. No findings suspicious for metastatic disease.   09/09/2021 - 03/25/2022 Chemotherapy   Patient is on Treatment Plan : BREAST Pembrolizumab (200) D1 + Carboplatin (5) D1 + Paclitaxel (80) D1,8,15 q21d X 4 cycles / Pembrolizumab (200) D1 + AC D1 q21d x 4 cycles     09/09/2021 -  Chemotherapy   Patient is on Treatment Plan : BREAST Pembrolizumab (200) D1 + Carboplatin (1.5) D1,8,15 + Paclitaxel (80) D1,8,15 q21d X 4 cycles / Pembrolizumab (200) D1 + AC D1 q21d x 4 cycles     03/16/2022 Definitive Surgery   FINAL MICROSCOPIC DIAGNOSIS:   A.  LEFT BREAST, LUMPECTOMY:  Negative for residual carcinoma (ypT0)  Changes consistent with neoadjuvant therapy  Changes consistent with prior biopsy   B. LEFT AXILLARY SENTINEL LYMPH NODE, EXCISION:  One benign lymph node, negative for carcinoma (0/1)   C. LEFT AXILLARY SENTINEL LYMPH NODE, EXCISION:  One benign lymph node, negative for carcinoma (0/1)    03/16/2022 Cancer Staging   Staging form: Breast, AJCC 8th Edition - Pathologic stage from 03/16/2022: No Stage Recommended (ypT0, pN0, cM0) - Signed by Truitt Merle, MD on 05/06/2022 Stage prefix: Post-therapy Response to neoadjuvant therapy: Complete response Residual tumor (R): R0 - None      INTERVAL HISTORY: *** Cheyenne [name] is here for a follow up of  left breast cancer  She was last seen by me on 06/17/2022 She presents to the clinic      All other systems were reviewed with the patient and are negative.  MEDICAL HISTORY:  Past Medical History:  Diagnosis Date   Anal fissure    Anxiety    Breast cancer ([place]) 08/10/21   Date diagnosis was given   Complication of anesthesia 03/16/2022   sore on top of mouth   DVT (deep venous thrombosis) ([place])    History of radiation therapy    Left breast- 05/05/22- 06/20/22- Dr. Gery Pray   Incomplete right bundle branch block (RBBB) determined by electrocardiography 09/01/2021    SURGICAL HISTORY: Past Surgical History:  Procedure Laterality Date   BREAST LUMPECTOMY WITH RADIOACTIVE SEED AND SENTINEL LYMPH NODE BIOPSY Left 03/16/2022   Procedure: LEFT BREAST SEED LUMPECTOMY, LEFT SENTINEL [place];  Surgeon: Erroll Luna, MD;  Location: [facility];  Service: General;  Laterality: Left;  GEN & PEC BLOCK   PORT-A-CATH REMOVAL N/A 07/20/2022   Procedure: REMOVAL PORT-A-CATH;  Surgeon: Erroll Luna, MD;  Location: [facility];  Service: General;  Laterality: N/A;   PORTACATH PLACEMENT N/A 09/08/2021   Procedure: INSERTION  PORT-A-CATH;  Surgeon: Erroll Luna, MD;  Location: [facility];  Service: General;  Laterality: N/A;    I have reviewed the social history and family history with the patient and they are unchanged from previous note.  ALLERGIES:  has No Known Allergies.  MEDICATIONS:  Current Outpatient Medications  Medication Sig Dispense Refill   acetaminophen (TYLENOL) 500 MG tablet Take 500-1,000 mg by mouth every 6 (six) hours as needed (pain.).     albuterol (VENTOLIN HFA) 108 (90 Base) MCG/ACT inhaler Inhale 2 puffs into the lungs every 4 (four) hours as needed for wheezing. 1 each 5   ALPRAZolam (XANAX) 0.5 MG tablet Take one tablet twice daily as needed for anxiety. 45 tablet 2   cyclobenzaprine (FLEXERIL) 10 MG tablet Take 10 mg by mouth daily as needed for muscle spasms. (Patient not taking: Reported on 04/14/2022)     desonide (DESOWEN) 0.05 % cream Apply topically 2 (two) times daily. (Patient taking differently: Apply 1 application  topically 2 (two) times daily as needed (skin irritation.).) 30 g 1   desonide (DESOWEN) 0.05 % lotion Apply 1 application. topically daily as needed (acne).     EPINEPHrine 0.3 mg/0.3 mL IJ SOAJ injection Inject 0.3 mg into the muscle as needed for anaphylaxis. 1 each 0   hydroquinone 4 % cream APPLY TO DARK SPOTS AT BEDTIME 28.35 g 0   ibuprofen (ADVIL) 800 MG tablet Take 1 tablet (800 mg total) by mouth every 8 (eight) hours as needed. 30 tablet 0   ibuprofen (ADVIL) 800 MG tablet Take 1 tablet (800 mg total) by mouth every 8 (eight) hours as needed. 30 tablet 0   KLOR-CON M20 20 MEQ tablet TAKE 1 TABLET BY MOUTH TWICE A DAY 180 tablet 1   lidocaine-prilocaine (EMLA) cream Apply topically daily.     loratadine (CLARITIN) 10 MG tablet Take 10 mg by mouth daily.     magic mouthwash (nystatin, diphenhydrAMINE, alum & mag hydroxide) suspension mixture Swish and swallow 5 mLs by mouth 3 (three) times daily as needed for mouth pain. (Patient not taking: Reported on  04/14/2022) 140 mL 1   Multiple Vitamins-Minerals (ONE A DAY IMMUNITY DEFENSE) CHEW Chew 1 tablet by mouth daily.     oxyCODONE (OXY IR/ROXICODONE) 5 MG immediate release tablet Take 1 tablet (5 mg total) by mouth every 6 (six) hours as needed for severe pain. (Patient not taking: Reported on 04/14/2022) 15 tablet 0   traMADol (ULTRAM) 50 MG tablet Take 1 tablet (50 mg total) by mouth every 6 (six) hours as needed. (Patient not taking: Reported on 04/14/2022) 5 tablet 0   No current facility-administered medications for this visit.    PHYSICAL EXAMINATION: ECOG PERFORMANCE STATUS: {CHL ONC ECOG PS:[phone]}  There were no vitals filed for this visit. Wt Readings from Last 3 Encounters:  07/30/22 187 lb (84.8 kg)  07/22/22 186 lb 6 oz (84.5 kg)  07/20/22 185 lb 13.6 oz (84.3 kg)    {Only keep what was examined. If exam not performed, can use .CEXAM } GENERAL:alert, no distress and comfortable SKIN: skin color, texture, turgor are normal, no rashes or significant lesions EYES: normal, Conjunctiva are pink and non-injected, sclera clear {  OROPHARYNX:no exudate, no erythema and lips, buccal mucosa, and tongue normal}  NECK: supple, thyroid normal size, non-tender, without nodularity LYMPH:  no palpable lymphadenopathy in the cervical, axillary {or inguinal} LUNGS: clear to auscultation and percussion with normal breathing effort HEART: regular rate & rhythm and no murmurs and no lower extremity edema ABDOMEN:abdomen soft, non-tender and normal bowel sounds Musculoskeletal:no cyanosis of digits and no clubbing  NEURO: alert & oriented x 3 with fluent speech, no focal motor/sensory deficits  LABORATORY DATA:  I have reviewed the data as listed    Latest Ref Rng & Units 07/30/2022    8:52 AM 07/09/2022    9:08 AM 06/16/2022   11:28 AM  CBC  WBC 4.0 - 10.5 K/uL 5.8  5.2  4.9   Hemoglobin 12.0 - 15.0 g/dL 11.9  11.8  11.8   Hematocrit 36.0 - 46.0 % 34.0  34.0  34.9   Platelets 150 - 400  K/uL 245  249  249         Latest Ref Rng & Units 07/30/2022    8:52 AM 07/09/2022    9:08 AM 06/16/2022   11:28 AM  CMP  Glucose 70 - 99 mg/dL 146  92  92   BUN 6 - 20 mg/dL '19  15  14   '$ Creatinine 0.44 - 1.00 mg/dL 0.78  0.75  0.81   Sodium 135 - 145 mmol/L 138  139  139   Potassium 3.5 - 5.1 mmol/L 3.4  3.7  3.9   Chloride 98 - 111 mmol/L 103  102  101   CO2 22 - 32 mmol/L '25  31  31   '$ Calcium 8.9 - 10.3 mg/dL 9.7  9.9  10.0   Total Protein 6.5 - 8.1 g/dL 7.6  7.5  8.2   Total Bilirubin 0.3 - 1.2 mg/dL 0.5  0.5  0.6   Alkaline Phos 38 - 126 U/L 85  97  98   AST 15 - 41 U/L '18  21  23   '$ ALT 0 - 44 U/L '17  23  25       '$ RADIOGRAPHIC STUDIES: I have personally reviewed the radiological images as listed and agreed with the findings in the report. No results found.    No orders of the defined types were placed in this encounter.  All questions were answered. The patient knows to call the clinic with any problems, questions or concerns. No barriers to learning was detected. The total time spent in the appointment was {CHL ONC TIME VISIT - [id number]}.     Baldemar Friday, CMA 08/19/2022   I, Audry Riles, CMA, am acting as scribe for Truitt Merle, MD.   {Add scribe attestation statement}

## 2022-08-20 ENCOUNTER — Inpatient Hospital Stay: Payer: Federal, State, Local not specified - PPO

## 2022-08-20 ENCOUNTER — Encounter: Payer: Self-pay | Admitting: Hematology

## 2022-08-20 ENCOUNTER — Other Ambulatory Visit: Payer: Federal, State, Local not specified - PPO

## 2022-08-20 ENCOUNTER — Inpatient Hospital Stay: Payer: Federal, State, Local not specified - PPO | Attending: Hematology | Admitting: Hematology

## 2022-08-20 VITALS — BP 109/69 | HR 81 | Temp 99.0°F | Resp 18 | Ht 62.0 in | Wt 189.2 lb

## 2022-08-20 VITALS — BP 108/81 | HR 84 | Resp 17

## 2022-08-20 DIAGNOSIS — C50312 Malignant neoplasm of lower-inner quadrant of left female breast: Secondary | ICD-10-CM | POA: Insufficient documentation

## 2022-08-20 DIAGNOSIS — Z5112 Encounter for antineoplastic immunotherapy: Secondary | ICD-10-CM | POA: Insufficient documentation

## 2022-08-20 DIAGNOSIS — Z170 Estrogen receptor positive status [ER+]: Secondary | ICD-10-CM

## 2022-08-20 LAB — CBC WITH DIFFERENTIAL (CANCER CENTER ONLY)
Abs Immature Granulocytes: 0.02 10*3/uL (ref 0.00–0.07)
Basophils Absolute: 0 10*3/uL (ref 0.0–0.1)
Basophils Relative: 1 %
Eosinophils Absolute: 0.1 10*3/uL (ref 0.0–0.5)
Eosinophils Relative: 2 %
HCT: 38.7 % (ref 36.0–46.0)
Hemoglobin: 13.5 g/dL (ref 12.0–15.0)
Immature Granulocytes: 0 %
Lymphocytes Relative: 30 %
Lymphs Abs: 1.7 10*3/uL (ref 0.7–4.0)
MCH: 32.5 pg (ref 26.0–34.0)
MCHC: 34.9 g/dL (ref 30.0–36.0)
MCV: 93 fL (ref 80.0–100.0)
Monocytes Absolute: 0.5 10*3/uL (ref 0.1–1.0)
Monocytes Relative: 9 %
Neutro Abs: 3.3 10*3/uL (ref 1.7–7.7)
Neutrophils Relative %: 58 %
Platelet Count: 272 10*3/uL (ref 150–400)
RBC: 4.16 MIL/uL (ref 3.87–5.11)
RDW: 13.2 % (ref 11.5–15.5)
WBC Count: 5.6 10*3/uL (ref 4.0–10.5)
nRBC: 0 % (ref 0.0–0.2)

## 2022-08-20 LAB — CMP (CANCER CENTER ONLY)
ALT: 20 U/L (ref 0–44)
AST: 19 U/L (ref 15–41)
Albumin: 4.3 g/dL (ref 3.5–5.0)
Alkaline Phosphatase: 101 U/L (ref 38–126)
Anion gap: 8 (ref 5–15)
BUN: 13 mg/dL (ref 6–20)
CO2: 29 mmol/L (ref 22–32)
Calcium: 10.3 mg/dL (ref 8.9–10.3)
Chloride: 102 mmol/L (ref 98–111)
Creatinine: 0.81 mg/dL (ref 0.44–1.00)
GFR, Estimated: >60 mL/min (ref >60)
Glucose, Bld: 77 mg/dL (ref 70–99)
Potassium: 3.6 mmol/L (ref 3.5–5.1)
Sodium: 139 mmol/L (ref 135–145)
Total Bilirubin: 0.6 mg/dL (ref 0.3–1.2)
Total Protein: 7.7 g/dL (ref 6.5–8.1)

## 2022-08-20 MED ORDER — SODIUM CHLORIDE 0.9 % IV SOLN
200.0000 mg | Freq: Once | INTRAVENOUS | Status: AC
  Administered 2022-08-20: 200 mg via INTRAVENOUS
  Filled 2022-08-20: qty 200

## 2022-08-20 MED ORDER — SODIUM CHLORIDE 0.9 % IV SOLN: Freq: Once | INTRAVENOUS | Status: AC

## 2022-08-20 NOTE — Progress Notes (Addendum)
[place]   Telephone:([phone]) [phone] Fax:(336) [phone]   Clinic Follow up Note    Patient Care Team: Billie Ruddy, MD as PCP - General (Family Medicine) Erroll Luna, MD as Consulting Physician (General Surgery) Truitt Merle, MD as Consulting Physician (Hematology) Gery Pray, MD as Consulting Physician (Radiation Oncology)   Date of Service:  08/20/2022 CHIEF COMPLAINT: f/u of  left breast cancer    CURRENT THERAPY:  [name], starting 09/09/21    ASSESSMENT:  Cheyenne [name] is a 42 y.o. female with    Malignant neoplasm of lower-inner quadrant of left breast in female, estrogen receptor positive ([place]) Stage IIIA, c(T2, N1), Functionally triple negative, Grade 3, ypT0N0  -presented with palpable left breast mass. B/l MM and left [place] on 08/07/21 showed: 2.8 cm left breast mass at 7:30; at least 4 abnormal lymph nodes. Biopsy that day confirmed IDC in breast but node was negative. -breast MRI on 08/26/21 and PET on 08/31/21 were negative aside from primary mass.  -port placed 09/08/21  -s/p neoadjuvant carbo/taxol and keytruda on 09/09/21 - 11/25/21; her breast mass became no longer palpable after cycle 3. She then completed Epirubicin/cytoxan 12/02/21 - 02/03/22 (she had allergic reaction to Adriamycin), tolerated moderately well overall. -left lumpectomy 03/16/22 with Dr. Brantley Stage showed no residual carcinoma, indicating complete response. -she completed one year Keytruda on 08/19/22. -she completed radiation under Dr. Sondra Come on 06/20/22.    PLAN: - mammogram in Feburary -Lab reviewed, will proceed to last Keytruda today. -Follow-up with surgeon March - see NP for Survivorship appointment in 4 months     SUMMARY OF ONCOLOGIC HISTORY:     Oncology History Overview Note    Cancer Staging  Malignant neoplasm of lower-inner quadrant of left breast in female, estrogen receptor positive ([place]) Staging form: Breast, AJCC 8th Edition - Clinical stage from  08/07/2021: Stage IIIA (cT2, cN1, cM0, G3, ER+, PR-, HER2-) - Signed by Truitt Merle, MD on 08/18/2021      Malignant neoplasm of lower-inner quadrant of left breast in female, estrogen receptor positive ([place])   08/07/2021 Cancer Staging     Staging form: Breast, AJCC 8th Edition - Clinical stage from 08/07/2021: Stage IIB (cT2, cN0, cM0, G3, ER-, PR-, HER2-) - Signed by Truitt Merle, MD on 09/24/2021 Stage prefix: Initial diagnosis Histologic grading system: 3 grade system     08/07/2021 Mammogram     CLINICAL DATA:  Palpable mass in the LEFT breast since August.   EXAM: DIGITAL DIAGNOSTIC BILATERAL MAMMOGRAM WITH TOMOSYNTHESIS AND CAD; ULTRASOUND LEFT BREAST LIMITED   IMPRESSION: 1. Suspicious mass in the 7:30 o'clock location of the LEFT breast for which biopsy is recommended. 2. LEFT axillary adenopathy, with at least 4 abnormal lymph nodes.     08/07/2021 Initial Biopsy     Diagnosis 1. Breast, left, needle core biopsy, 7:30, ribbon clip - INVASIVE DUCTAL CARCINOMA - SEE COMMENT 2. Lymph node, needle/core biopsy, left axilla, coil clip - NO CARCINOMA IDENTIFIED Microscopic Comment 1. based on the biopsy, the carcinoma appears Nottingham grade 3 of 3 and measures 1.1 cm in greatest linear extent.   1. PROGNOSTIC INDICATORS Results: The tumor cells are EQUIVOCAL for Her2 (2+). Her2 by FISH will be performed and results reported separately. Estrogen Receptor: 2%, POSITIVE, WEAK STAINING INTENSITY Progesterone Receptor: <1%, NEGATIVE Proliferation Marker Ki67: 40%   1. FLUORESCENCE IN-SITU HYBRIDIZATION Results: GROUP 5: HER2 **NEGATIVE**     08/17/2021 Initial Diagnosis     Malignant neoplasm of lower-inner quadrant  of left breast in female, estrogen receptor positive ([place])     08/19/2021 Genetic Testing     [facility] CancerNext-Expanded Panel is Negative. Report date is 09/02/2021.   The CancerNext-Expanded gene panel offered by [facility] and includes sequencing, rearrangement, and RNA  analysis for the following 77 genes: AIP, ALK, APC, ATM, AXIN2, BAP1, BARD1, BLM, BMPR1A, BRCA1, BRCA2, BRIP1, CDC73, CDH1, CDK4, CDKN1B, CDKN2A, CHEK2, CTNNA1, DICER1, FANCC, FH, FLCN, GALNT12, KIF1B, LZTR1, MAX, MEN1, MET, MLH1, MSH2, MSH3, MSH6, MUTYH, NBN, NF1, NF2, NTHL1, PALB2, PHOX2B, PMS2, POT1, PRKAR1A, PTCH1, PTEN, RAD51C, RAD51D, RB1, RECQL, RET, SDHA, SDHAF2, SDHB, SDHC, SDHD, SMAD4, SMARCA4, SMARCB1, SMARCE1, STK11, SUFU, TMEM127, TP53, TSC1, TSC2, VHL and XRCC2 (sequencing and deletion/duplication); EGFR, EGLN1, HOXB13, KIT, MITF, PDGFRA, POLD1, and POLE (sequencing only); EPCAM and GREM1 (deletion/duplication only).      08/26/2021 Imaging     EXAM: BILATERAL BREAST MRI WITH AND WITHOUT CONTRAST   IMPRESSION: 2.3 cm mass in the lower-inner quadrant of the left breast corresponding with the biopsy proven invasive ductal carcinoma.     08/31/2021 PET scan     IMPRESSION: 1. Known mass medially in the left breast is markedly hypermetabolic consistent with breast cancer. 2. No evidence of nodal metastases within the chest. No typical distant metastases. 3. Suspected acute inflammatory process involving central mesentery and pelvis with ill-defined focal hypermetabolic activity anterior to the sacrum. There are pelvic inflammatory changes and free pelvic fluid. Recent labs demonstrate mild leukocytosis. Differential considerations include acute appendicitis, enteritis and small perforated foreign body. No evidence of drainable fluid collection, pneumoperitoneum or bowel obstruction. 4. These results were called by telephone at the time of interpretation on 08/31/2021 at 5:12 pm to on-call oncology nurse, [name], who verbally acknowledged these results. Unless there is a good clinical explanation for these findings, evaluation in the emergency department is likely warranted, and patient may benefit from abdominopelvic CT with oral and intravenous contrast.     09/01/2021 Imaging      EXAM: CT ABDOMEN AND PELVIS WITH CONTRAST   IMPRESSION: Multiple thick-walled loops of distal small bowel, suggesting infectious/inflammatory enteritis. Associated small volume pelvic ascites. No pneumatosis or free air.   No evidence of bowel obstruction.  Normal appendix.   2.1 cm lesion in the medial left breast, likely corresponding to the patient's known primary breast neoplasm. No findings suspicious for metastatic disease.     09/09/2021 - 03/25/2022 Chemotherapy     Patient is on Treatment Plan : BREAST Pembrolizumab (200) D1 + Carboplatin (5) D1 + Paclitaxel (80) D1,8,15 q21d X 4 cycles / Pembrolizumab (200) D1 + AC D1 q21d x 4 cycles      09/09/2021 -  Chemotherapy     Patient is on Treatment Plan : BREAST Pembrolizumab (200) D1 + Carboplatin (1.5) D1,8,15 + Paclitaxel (80) D1,8,15 q21d X 4 cycles / Pembrolizumab (200) D1 + AC D1 q21d x 4 cycles      03/16/2022 Definitive Surgery     FINAL MICROSCOPIC DIAGNOSIS:   A. LEFT BREAST, LUMPECTOMY:  Negative for residual carcinoma (ypT0)  Changes consistent with neoadjuvant therapy  Changes consistent with prior biopsy   B. LEFT AXILLARY SENTINEL LYMPH NODE, EXCISION:  One benign lymph node, negative for carcinoma (0/1)   C. LEFT AXILLARY SENTINEL LYMPH NODE, EXCISION:  One benign lymph node, negative for carcinoma (0/1)      03/16/2022 Cancer Staging     Staging form: Breast, AJCC 8th Edition - Pathologic stage from 03/16/2022: No Stage Recommended (ypT0,  pN0, cM0) - Signed by Truitt Merle, MD on 05/06/2022 Stage prefix: Post-therapy Response to neoadjuvant therapy: Complete response Residual tumor (R): R0 - None           INTERVAL HISTORY:  Cheyenne [name] is here for a follow up of  left breast cancer  She was last seen by me on 06/17/2022 She presents to the clinic accompanied with her wife. Pt states she has pain in her left breast all the time, pt also has pain when she reaches out. Pt is having hot flashes, pt states her  energy level is good. Pt surgfical site is red and has scar tissue, nerve damage from surgery, pt complains of joint pain fingers, wrist, and knees that is worse in the moring       All other systems were reviewed with the patient and are negative.   MEDICAL HISTORY:      Past Medical History:  Diagnosis Date   Anal fissure     Anxiety     Breast cancer ([place]) 08/10/21    Date diagnosis was given   Complication of anesthesia 03/16/2022    sore on top of mouth   DVT (deep venous thrombosis) ([place])     History of radiation therapy      Left breast- 05/05/22- 06/20/22- Dr. Gery Pray   Incomplete right bundle branch block (RBBB) determined by electrocardiography 09/01/2021      SURGICAL HISTORY:      Past Surgical History:  Procedure Laterality Date   BREAST LUMPECTOMY WITH RADIOACTIVE SEED AND SENTINEL LYMPH NODE BIOPSY Left 03/16/2022    Procedure: LEFT BREAST SEED LUMPECTOMY, LEFT SENTINEL [place];  Surgeon: Erroll Luna, MD;  Location: [place];  Service: General;  Laterality: Left;  [place] N/A 07/20/2022    Procedure: REMOVAL PORT-A-CATH;  Surgeon: Erroll Luna, MD;  Location: [place];  Service: General;  Laterality: N/A;   PORTACATH PLACEMENT N/A 09/08/2021    Procedure: INSERTION PORT-A-CATH;  Surgeon: Erroll Luna, MD;  Location: [facility];  Service: General;  Laterality: N/A;      I have reviewed the social history and family history with the patient and they are unchanged from previous note.   ALLERGIES:  has No Known Allergies.   MEDICATIONS:        Current Outpatient Medications  Medication Sig Dispense Refill   acetaminophen (TYLENOL) 500 MG tablet Take 500-1,000 mg by mouth every 6 (six) hours as needed (pain.).       albuterol (VENTOLIN HFA) 108 (90 Base) MCG/ACT inhaler Inhale 2 puffs into the lungs every 4 (four) hours as needed for wheezing. 1 each 5   ALPRAZolam (XANAX) 0.5 MG  tablet Take one tablet twice daily as needed for anxiety. 45 tablet 2   cyclobenzaprine (FLEXERIL) 10 MG tablet Take 10 mg by mouth daily as needed for muscle spasms. (Patient not taking: Reported on 04/14/2022)       desonide (DESOWEN) 0.05 % cream Apply topically 2 (two) times daily. (Patient taking differently: Apply 1 application  topically 2 (two) times daily as needed (skin irritation.).) 30 g 1   desonide (DESOWEN) 0.05 % lotion Apply 1 application. topically daily as needed (acne).       EPINEPHrine 0.3 mg/0.3 mL IJ SOAJ injection Inject 0.3 mg into the muscle as needed for anaphylaxis. 1 each 0   hydroquinone 4 % cream APPLY TO DARK SPOTS AT BEDTIME 28.35 g 0  ibuprofen (ADVIL) 800 MG tablet Take 1 tablet (800 mg total) by mouth every 8 (eight) hours as needed. 30 tablet 0   ibuprofen (ADVIL) 800 MG tablet Take 1 tablet (800 mg total) by mouth every 8 (eight) hours as needed. 30 tablet 0   KLOR-CON M20 20 MEQ tablet TAKE 1 TABLET BY MOUTH TWICE A DAY 180 tablet 1   lidocaine-prilocaine (EMLA) cream Apply topically daily.       loratadine (CLARITIN) 10 MG tablet Take 10 mg by mouth daily.       magic mouthwash (nystatin, diphenhydrAMINE, alum & mag hydroxide) suspension mixture Swish and swallow 5 mLs by mouth 3 (three) times daily as needed for mouth pain. (Patient not taking: Reported on 04/14/2022) 140 mL 1   Multiple Vitamins-Minerals (ONE A DAY IMMUNITY DEFENSE) CHEW Chew 1 tablet by mouth daily.       oxyCODONE (OXY IR/ROXICODONE) 5 MG immediate release tablet Take 1 tablet (5 mg total) by mouth every 6 (six) hours as needed for severe pain. (Patient not taking: Reported on 04/14/2022) 15 tablet 0   traMADol (ULTRAM) 50 MG tablet Take 1 tablet (50 mg total) by mouth every 6 (six) hours as needed. (Patient not taking: Reported on 04/14/2022) 5 tablet 0    No current facility-administered medications for this visit.      PHYSICAL EXAMINATION: ECOG PERFORMANCE STATUS: 1 - Symptomatic but  completely ambulatory   There were no vitals filed for this visit.    Wt Readings from Last 3 Encounters:  07/30/22 187 lb (84.8 kg)  07/22/22 186 lb 6 oz (84.5 kg)  07/20/22 185 lb 13.6 oz (84.3 kg)     GENERAL:alert, no distress and comfortable SKIN: skin color, texture, turgor are normal, no rashes or significant lesions EYES: normal, Conjunctiva are pink and non-injected, sclera clear NECK: supple, thyroid normal size, non-tender, without nodularity LYMPH:  no palpable lymphadenopathy in the cervical, axillary  LUNGS: clear to auscultation and percussion with normal breathing effort HEART: regular rate & rhythm and no murmurs and no lower extremity edema ABDOMEN:abdomen soft, non-tender and normal bowel sounds Musculoskeletal:no cyanosis of digits and no clubbing  NEURO: alert & oriented x 3 with fluent speech, no focal motor/sensory deficits Breasts: Breast inspection showed them to be symmetrical with no nipple discharge. Palpation of the breasts and axilla revealed no obvious mass that I could appreciate except a mild surgical scar in the left breast.    LABORATORY DATA:  I have reviewed the data as listed     Latest Ref Rng & Units 07/30/2022    8:52 AM 07/09/2022    9:08 AM 06/16/2022   11:28 AM  CBC  WBC 4.0 - 10.5 K/uL 5.8  5.2  4.9   Hemoglobin 12.0 - 15.0 g/dL 11.9  11.8  11.8   Hematocrit 36.0 - 46.0 % 34.0  34.0  34.9   Platelets 150 - 400 K/uL 245  249  249             Latest Ref Rng & Units 07/30/2022    8:52 AM 07/09/2022    9:08 AM 06/16/2022   11:28 AM  CMP  Glucose 70 - 99 mg/dL 146  92  92   BUN 6 - 20 mg/dL '19  15  14   '$ Creatinine 0.44 - 1.00 mg/dL 0.78  0.75  0.81   Sodium 135 - 145 mmol/L 138  139  139   Potassium 3.5 - 5.1 mmol/L 3.4  3.7  3.9   Chloride 98 - 111 mmol/L 103  102  101   CO2 22 - 32 mmol/L '25  31  31   '$ Calcium 8.9 - 10.3 mg/dL 9.7  9.9  10.0   Total Protein 6.5 - 8.1 g/dL 7.6  7.5  8.2   Total Bilirubin 0.3 - 1.2 mg/dL 0.5   0.5  0.6   Alkaline Phos 38 - 126 U/L 85  97  98   AST 15 - 41 U/L '18  21  23   '$ ALT 0 - 44 U/L '17  23  25           '$ RADIOGRAPHIC STUDIES: I have personally reviewed the radiological images as listed and agreed with the findings in the report. Imaging Results (Last 48 hours)  No results found.        No orders of the defined types were placed in this encounter.   All questions were answered. The patient knows to call the clinic with any problems, questions or concerns. No barriers to learning was detected. The total time spent in the appointment was 30 minutes.     Truitt Merle, md 08/20/2022    I, Maurine Simmering, CMA, am acting as scribe for Truitt Merle, MD.    I have reviewed the above documentation for accuracy and completeness, and I agree with the above.

## 2022-08-20 NOTE — Patient Instructions (Signed)
[facility]  Discharge Instructions: Thank you for choosing [facility] to provide your oncology and hematology care.   If you have a lab appointment with the [facility], please go directly to the [facility] and check in at the registration area.   Wear comfortable clothing and clothing appropriate for easy access to any Portacath or PICC line.   We strive to give you quality time with your provider. You may need to reschedule your appointment if you arrive late (15 or more minutes).  Arriving late affects you and other patients whose appointments are after yours.  Also, if you miss three or more appointments without notifying the office, you may be dismissed from the clinic at the provider's discretion.      For prescription refill requests, have your pharmacy contact our office and allow 72 hours for refills to be completed.    Today you received the following chemotherapy and/or immunotherapy agents: Keytruda      To help prevent nausea and vomiting after your treatment, we encourage you to take your nausea medication as directed.  BELOW ARE SYMPTOMS THAT SHOULD BE REPORTED IMMEDIATELY: *FEVER GREATER THAN 100.4 F (38 C) OR HIGHER *CHILLS OR SWEATING *NAUSEA AND VOMITING THAT IS NOT CONTROLLED WITH YOUR NAUSEA MEDICATION *UNUSUAL SHORTNESS OF BREATH *UNUSUAL BRUISING OR BLEEDING *URINARY PROBLEMS (pain or burning when urinating, or frequent urination) *BOWEL PROBLEMS (unusual diarrhea, constipation, pain near the anus) TENDERNESS IN MOUTH AND THROAT WITH OR WITHOUT PRESENCE OF ULCERS (sore throat, sores in mouth, or a toothache) UNUSUAL RASH, SWELLING OR PAIN  UNUSUAL VAGINAL DISCHARGE OR ITCHING   Items with * indicate a potential emergency and should be followed up as soon as possible or go to the Emergency Department if any problems should occur.  Please show the CHEMOTHERAPY ALERT CARD or IMMUNOTHERAPY ALERT CARD at  check-in to the Emergency Department and triage nurse.  Should you have questions after your visit or need to cancel or reschedule your appointment, please contact [facility]  Dept: [phone]  and follow the prompts.  Office hours are 8:00 a.m. to 4:30 p.m. Monday - Friday. Please note that voicemails left after 4:00 p.m. may not be returned until the following business day.  We are closed weekends and major holidays. You have access to a nurse at all times for urgent questions. Please call the main number to the clinic Dept: [phone] and follow the prompts.   For any non-urgent questions, you may also contact your provider using MyChart. We now offer e-Visits for anyone 14 and older to request care online for non-urgent symptoms. For details visit [email or web address].   Also download the MyChart app! Go to the app store, search "MyChart", open the app, select [facility], and log in with your MyChart username and password.

## 2022-08-20 NOTE — Assessment & Plan Note (Signed)
Stage IIIA, c(T2, N1), Functionally triple negative, Grade 3, ypT0N0  -presented with palpable left breast mass. B/l MM and left [place] on 08/07/21 showed: 2.8 cm left breast mass at 7:30; at least 4 abnormal lymph nodes. Biopsy that day confirmed IDC in breast but node was negative. -breast MRI on 08/26/21 and PET on 08/31/21 were negative aside from primary mass.  -port placed 09/08/21  -s/p neoadjuvant carbo/taxol and keytruda on 09/09/21 - 11/25/21; her breast mass became no longer palpable after cycle 3. She then completed Epirubicin/cytoxan 12/02/21 - 02/03/22 (she had allergic reaction to Adriamycin), tolerated moderately well overall. -left lumpectomy 03/16/22 with Dr. Brantley Stage showed no residual carcinoma, indicating complete response. -she completed one year Keytruda on 08/19/22. -she completed radiation under Dr. Sondra Come on 06/20/22.

## 2022-08-23 ENCOUNTER — Encounter: Payer: Self-pay | Admitting: *Deleted

## 2022-08-23 ENCOUNTER — Telehealth: Payer: Self-pay | Admitting: Hematology

## 2022-08-23 DIAGNOSIS — Z170 Estrogen receptor positive status [ER+]: Secondary | ICD-10-CM

## 2022-08-23 DIAGNOSIS — C50312 Malignant neoplasm of lower-inner quadrant of left female breast: Secondary | ICD-10-CM

## 2022-08-23 NOTE — Telephone Encounter (Signed)
Spoke with patient confirming upcoming appointments  

## 2022-08-24 ENCOUNTER — Other Ambulatory Visit: Payer: Self-pay

## 2022-08-27 ENCOUNTER — Other Ambulatory Visit: Payer: Self-pay

## 2022-09-14 ENCOUNTER — Encounter: Payer: Self-pay | Admitting: Hematology

## 2022-09-17 ENCOUNTER — Ambulatory Visit
Admission: RE | Admit: 2022-09-17 | Discharge: 2022-09-17 | Disposition: A | Discharge status: Home / Self Care | Payer: Federal, State, Local not specified - PPO | Source: Ambulatory Visit | Attending: Hematology | Admitting: Hematology

## 2022-09-17 DIAGNOSIS — Z170 Estrogen receptor positive status [ER+]: Secondary | ICD-10-CM

## 2022-09-17 DIAGNOSIS — R928 Other abnormal and inconclusive findings on diagnostic imaging of breast: Secondary | ICD-10-CM | POA: Diagnosis not present

## 2022-09-17 DIAGNOSIS — Z853 Personal history of malignant neoplasm of breast: Secondary | ICD-10-CM | POA: Diagnosis not present

## 2022-09-17 HISTORY — DX: Personal history of irradiation: Z92.3

## 2022-09-27 ENCOUNTER — Other Ambulatory Visit: Payer: Self-pay

## 2022-09-27 ENCOUNTER — Ambulatory Visit: Payer: Federal, State, Local not specified - PPO | Attending: Surgery

## 2022-09-27 VITALS — Wt 192.0 lb

## 2022-09-27 DIAGNOSIS — Z483 Aftercare following surgery for neoplasm: Secondary | ICD-10-CM | POA: Insufficient documentation

## 2022-09-27 NOTE — Therapy (Signed)
OUTPATIENT PHYSICAL THERAPY [facility]ZO SCREENING NOTE   Patient Name: Cheyenne [name] MRN: [id number] DOB:Jul 16, 1981, 42 y.o., female Today's Date: 09/27/2022  PCP: Billie Ruddy, MD REFERRING PROVIDER: Erroll Luna, MD   PT End of Session - 09/27/22 1659     Visit Number 7   # unchanged due to screen only   PT Start Time 1658    PT Stop Time 1702    PT Time Calculation (min) 4 min    Activity Tolerance Patient tolerated treatment well             Past Medical History:  Diagnosis Date   Anal fissure    Anxiety    Breast cancer ([place]) 08/10/21   Date diagnosis was given   Complication of anesthesia 03/16/2022   sore on top of mouth   DVT (deep venous thrombosis) ([place])    History of radiation therapy    Left breast- 05/05/22- 06/20/22- Dr. Gery Pray   Incomplete right bundle branch block (RBBB) determined by electrocardiography 09/01/2021   Personal history of radiation therapy    Past Surgical History:  Procedure Laterality Date   BREAST LUMPECTOMY     BREAST LUMPECTOMY WITH RADIOACTIVE SEED AND SENTINEL LYMPH NODE BIOPSY Left 03/16/2022   Procedure: LEFT BREAST SEED LUMPECTOMY, LEFT SENTINEL [place];  Surgeon: Erroll Luna, MD;  Location: [place];  Service: General;  Laterality: Left;  [place] N/A 07/20/2022   Procedure: REMOVAL PORT-A-CATH;  Surgeon: Erroll Luna, MD;  Location: [facility];  Service: General;  Laterality: N/A;   PORTACATH PLACEMENT N/A 09/08/2021   Procedure: INSERTION PORT-A-CATH;  Surgeon: Erroll Luna, MD;  Location[facility];  Service: General;  Laterality: N/A;   Patient Active Problem List   Diagnosis Date Noted   Port-A-Cath in place 09/16/2021   Genetic testing 08/31/2021   Malignant neoplasm of lower-inner quadrant of left breast in female, estrogen receptor positive ([place]) 08/17/2021   Anxiety 08/06/2019   Plantar fasciitis 08/06/2019   Fluid collection  (edema) in the arms, legs, hands and feet 05/02/2018    REFERRING DIAG: left breast cancer at risk for lymphedema  THERAPY DIAG:  Aftercare following surgery for neoplasm  PERTINENT HISTORY: Patient was diagnosed on 08/07/2021 with left grade III invasive ductal carcinoma breast cancer. It measures 2.8 cm and is located in the lower inner quadrant. It is weakly (2%) ER positive, PR and HER2 negative with a Ki67 of 40%. 03/16/22- L breast seed lumpectomy and SLNB 0/2. Had post op seroma with drainage from inframammary incision as well as an additional one inferior to axillary incision  PRECAUTIONS: left UE Lymphedema risk, None  SUBJECTIVE: Pt returns for her first 3 month L-Dex screen.   PAIN:  Are you having pain? No  SOZO SCREENING: Patient was assessed today using the SOZO machine to determine the lymphedema index score. This was compared to her baseline score. It was determined that she is within the recommended range when compared to her baseline and no further action is needed at this time. She will continue SOZO screenings. These are done every 3 months for 2 years post operatively followed by every 6 months for 2 years, and then annually.   L-DEX FLOWSHEETS - 09/27/22 1700       L-DEX LYMPHEDEMA SCREENING   Measurement Type Unilateral    L-DEX MEASUREMENT EXTREMITY Upper Extremity    POSITION  Standing    DOMINANT SIDE Right  At Risk Side Left    BASELINE SCORE (UNILATERAL) 3.5    L-DEX SCORE (UNILATERAL) 4.2    VALUE CHANGE (UNILAT) 0.7              Otelia Limes, PTA 09/27/2022, 5:00 PM

## 2022-09-29 ENCOUNTER — Other Ambulatory Visit: Payer: Self-pay

## 2022-10-01 DIAGNOSIS — C50912 Malignant neoplasm of unspecified site of left female breast: Secondary | ICD-10-CM | POA: Diagnosis not present

## 2022-10-20 ENCOUNTER — Encounter: Payer: Self-pay | Admitting: Nurse Practitioner

## 2022-10-26 ENCOUNTER — Encounter: Payer: Self-pay | Admitting: Dietician

## 2022-10-26 NOTE — Progress Notes (Signed)
Patient participated in the Nutrition and Cancer webinar. AICR guidelines were reviewed.  [facility] nutrition resources for survivors were encouraged (Nutrition 101, cooking classes, individual MNT with outpatient RDs in weight management and DM management).  Contact information provided.  Cyndi Rayya Yagi, RDN, LDN Registered Dietitian, [facility] Part Time Remote (Usual office hours: Tuesday-Thursday) Mobile: [phone] Remote Office: [phone]  

## 2022-11-04 DIAGNOSIS — L245 Irritant contact dermatitis due to other chemical products: Secondary | ICD-10-CM | POA: Diagnosis not present

## 2022-11-04 DIAGNOSIS — L719 Rosacea, unspecified: Secondary | ICD-10-CM | POA: Diagnosis not present

## 2022-12-08 DIAGNOSIS — Z124 Encounter for screening for malignant neoplasm of cervix: Secondary | ICD-10-CM | POA: Diagnosis not present

## 2022-12-08 DIAGNOSIS — Z01419 Encounter for gynecological examination (general) (routine) without abnormal findings: Secondary | ICD-10-CM | POA: Diagnosis not present

## 2022-12-10 ENCOUNTER — Telehealth: Payer: Self-pay | Admitting: Gastroenterology

## 2022-12-10 NOTE — Telephone Encounter (Signed)
Inbound call from patient, states she was advised her obgyn to call and try to be seen soon due to rectal bleeding and her being in remission for her cancer. Patient would like to speak to a nurse to further advise.

## 2022-12-13 NOTE — Telephone Encounter (Signed)
Called and spoke with patient. Pt reports that the rectal bleeding started about 2 weeks ago, she was having frequent bowel movements at this time. The rectal bleeding is similar to what she had before. Colonoscopy in 2022 showed internal hemorrhoids and anal fissure.  Pt reports that the blood has been a light pink and bright red. Pt has noticed the blood in the stool as well as with wiping. Pt denies any SOB, dizziness, or fatigue. Pt denies any rectal pain or itching. Patient has not tried any OTC measures. Pt has been advised that rectal bleeding is likely due to internal hemorrhoids. Pt has been advised to try Preparation H suppositories BID for a few days and to call back if no improvement by the end of the week. Pt has also been advised to drink plenty of fluids and use wet wipes instead of toilet paper. Patient has been advised that she will need to go to ER if she begins to pass blood independently of stool., or if she develops SOB, dizziness, or fatigue. Patient has been scheduled for a f/u with Willette Cluster, NP on 01/17/23 at 1:30 pm. Pt verbalized understanding of all information and had no concerns at the end of the call.

## 2022-12-14 ENCOUNTER — Other Ambulatory Visit: Payer: Self-pay

## 2022-12-20 ENCOUNTER — Other Ambulatory Visit: Payer: Self-pay

## 2022-12-20 DIAGNOSIS — Z170 Estrogen receptor positive status [ER+]: Secondary | ICD-10-CM

## 2022-12-20 DIAGNOSIS — E039 Hypothyroidism, unspecified: Secondary | ICD-10-CM

## 2022-12-21 ENCOUNTER — Inpatient Hospital Stay: Payer: Federal, State, Local not specified - PPO

## 2022-12-21 ENCOUNTER — Encounter: Payer: Self-pay | Admitting: Hematology

## 2022-12-21 ENCOUNTER — Other Ambulatory Visit: Payer: Federal, State, Local not specified - PPO

## 2022-12-21 ENCOUNTER — Encounter: Payer: Self-pay | Admitting: Nurse Practitioner

## 2022-12-21 ENCOUNTER — Ambulatory Visit (HOSPITAL_COMMUNITY)
Admission: RE | Admit: 2022-12-21 | Discharge: 2022-12-21 | Disposition: A | Discharge status: Home / Self Care | Payer: Federal, State, Local not specified - PPO | Source: Ambulatory Visit | Attending: Nurse Practitioner | Admitting: Nurse Practitioner

## 2022-12-21 ENCOUNTER — Inpatient Hospital Stay: Payer: Federal, State, Local not specified - PPO | Attending: Nurse Practitioner | Admitting: Nurse Practitioner

## 2022-12-21 VITALS — BP 115/73 | HR 89 | Temp 97.9°F | Resp 18 | Ht 62.0 in | Wt 204.0 lb

## 2022-12-21 DIAGNOSIS — R635 Abnormal weight gain: Secondary | ICD-10-CM | POA: Diagnosis not present

## 2022-12-21 DIAGNOSIS — M898X9 Other specified disorders of bone, unspecified site: Secondary | ICD-10-CM | POA: Diagnosis not present

## 2022-12-21 DIAGNOSIS — R232 Flushing: Secondary | ICD-10-CM | POA: Diagnosis not present

## 2022-12-21 DIAGNOSIS — C50312 Malignant neoplasm of lower-inner quadrant of left female breast: Secondary | ICD-10-CM | POA: Insufficient documentation

## 2022-12-21 DIAGNOSIS — Z170 Estrogen receptor positive status [ER+]: Secondary | ICD-10-CM | POA: Insufficient documentation

## 2022-12-21 DIAGNOSIS — Z853 Personal history of malignant neoplasm of breast: Secondary | ICD-10-CM | POA: Insufficient documentation

## 2022-12-21 DIAGNOSIS — Z803 Family history of malignant neoplasm of breast: Secondary | ICD-10-CM | POA: Diagnosis not present

## 2022-12-21 DIAGNOSIS — M2550 Pain in unspecified joint: Secondary | ICD-10-CM | POA: Insufficient documentation

## 2022-12-21 DIAGNOSIS — R079 Chest pain, unspecified: Secondary | ICD-10-CM | POA: Diagnosis not present

## 2022-12-21 DIAGNOSIS — E039 Hypothyroidism, unspecified: Secondary | ICD-10-CM

## 2022-12-21 LAB — CBC WITH DIFFERENTIAL (CANCER CENTER ONLY)
Abs Immature Granulocytes: 0.03 10*3/uL (ref 0.00–0.07)
Basophils Absolute: 0 10*3/uL (ref 0.0–0.1)
Basophils Relative: 1 %
Eosinophils Absolute: 0.2 10*3/uL (ref 0.0–0.5)
Eosinophils Relative: 3 %
HCT: 34 % — ABNORMAL LOW (ref 36.0–46.0)
Hemoglobin: 11.8 g/dL — ABNORMAL LOW (ref 12.0–15.0)
Immature Granulocytes: 0 %
Lymphocytes Relative: 33 %
Lymphs Abs: 2.5 10*3/uL (ref 0.7–4.0)
MCH: 32.1 pg (ref 26.0–34.0)
MCHC: 34.7 g/dL (ref 30.0–36.0)
MCV: 92.4 fL (ref 80.0–100.0)
Monocytes Absolute: 0.5 10*3/uL (ref 0.1–1.0)
Monocytes Relative: 6 %
Neutro Abs: 4.4 10*3/uL (ref 1.7–7.7)
Neutrophils Relative %: 57 %
Platelet Count: 283 10*3/uL (ref 150–400)
RBC: 3.68 MIL/uL — ABNORMAL LOW (ref 3.87–5.11)
RDW: 13 % (ref 11.5–15.5)
Smear Review: NORMAL
WBC Count: 7.7 10*3/uL (ref 4.0–10.5)
nRBC: 0 % (ref 0.0–0.2)

## 2022-12-21 LAB — CMP (CANCER CENTER ONLY)
ALT: 20 U/L (ref 0–44)
AST: 20 U/L (ref 15–41)
Albumin: 4.2 g/dL (ref 3.5–5.0)
Alkaline Phosphatase: 97 U/L (ref 38–126)
Anion gap: 7 (ref 5–15)
BUN: 12 mg/dL (ref 6–20)
CO2: 30 mmol/L (ref 22–32)
Calcium: 9.3 mg/dL (ref 8.9–10.3)
Chloride: 103 mmol/L (ref 98–111)
Creatinine: 0.79 mg/dL (ref 0.44–1.00)
GFR, Estimated: >60 mL/min (ref >60)
Glucose, Bld: 104 mg/dL — ABNORMAL HIGH (ref 70–99)
Potassium: 3.2 mmol/L — ABNORMAL LOW (ref 3.5–5.1)
Sodium: 140 mmol/L (ref 135–145)
Total Bilirubin: 0.6 mg/dL (ref 0.3–1.2)
Total Protein: 7.4 g/dL (ref 6.5–8.1)

## 2022-12-21 LAB — TSH: TSH: 1.943 u[IU]/mL (ref 0.350–4.500)

## 2022-12-21 LAB — T4, FREE: Free T4: 0.9 ng/dL (ref 0.61–1.12)

## 2022-12-21 NOTE — Progress Notes (Signed)
CLINIC:  Survivorship    Patient Care Team: Deeann Saint, MD as PCP - General (Family Medicine) Harriette Bouillon, MD as Consulting Physician (General Surgery) Malachy Mood, MD as Consulting Physician (Hematology) Antony Blackbird, MD as Consulting Physician (Radiation Oncology) Pollyann Samples, NP as Nurse Practitioner (Nurse Practitioner)   REASON FOR VISIT:  Routine follow-up post-treatment for a recent history of breast cancer.  BRIEF ONCOLOGIC HISTORY:  Oncology History Overview Note   Cancer Staging  Malignant neoplasm of lower-inner quadrant of left breast in female, estrogen receptor positive (HCC) Staging form: Breast, AJCC 8th Edition - Clinical stage from 08/07/2021: Stage IIIA (cT2, cN1, cM0, G3, ER+, PR-, HER2-) - Signed by Malachy Mood, MD on 08/18/2021    Malignant neoplasm of lower-inner quadrant of left breast in female, estrogen receptor positive (HCC)  08/07/2021 Cancer Staging   Staging form: Breast, AJCC 8th Edition - Clinical stage from 08/07/2021: Stage IIB (cT2, cN0, cM0, G3, ER-, PR-, HER2-) - Signed by Malachy Mood, MD on 09/24/2021 Stage prefix: Initial diagnosis Histologic grading system: 3 grade system   08/07/2021 Mammogram   CLINICAL DATA:  Palpable mass in the LEFT breast since August.   EXAM: DIGITAL DIAGNOSTIC BILATERAL MAMMOGRAM WITH TOMOSYNTHESIS AND CAD; ULTRASOUND LEFT BREAST LIMITED  IMPRESSION: 1. Suspicious mass in the 7:30 o'clock location of the LEFT breast for which biopsy is recommended. 2. LEFT axillary adenopathy, with at least 4 abnormal lymph nodes.   08/07/2021 Initial Biopsy   Diagnosis 1. Breast, left, needle core biopsy, 7:30, ribbon clip - INVASIVE DUCTAL CARCINOMA - SEE COMMENT 2. Lymph node, needle/core biopsy, left axilla, coil clip - NO CARCINOMA IDENTIFIED Microscopic Comment 1. based on the biopsy, the carcinoma appears Nottingham grade 3 of 3 and measures 1.1 cm in greatest linear extent.  1. PROGNOSTIC  INDICATORS Results: The tumor cells are EQUIVOCAL for Her2 (2+). Her2 by FISH will be performed and results reported separately. Estrogen Receptor: 2%, POSITIVE, WEAK STAINING INTENSITY Progesterone Receptor: <1%, NEGATIVE Proliferation Marker Ki67: 40%  1. FLUORESCENCE IN-SITU HYBRIDIZATION Results: GROUP 5: HER2 **NEGATIVE**   08/17/2021 Initial Diagnosis   Malignant neoplasm of lower-inner quadrant of left breast in female, estrogen receptor positive (HCC)   08/19/2021 Genetic Testing   [facility] CancerNext-Expanded Panel is Negative. Report date is 09/02/2021.  The CancerNext-Expanded gene panel offered by [facility] and includes sequencing, rearrangement, and RNA analysis for the following 77 genes: AIP, ALK, APC, ATM, AXIN2, BAP1, BARD1, BLM, BMPR1A, BRCA1, BRCA2, BRIP1, CDC73, CDH1, CDK4, CDKN1B, CDKN2A, CHEK2, CTNNA1, DICER1, FANCC, FH, FLCN, GALNT12, KIF1B, LZTR1, MAX, MEN1, MET, MLH1, MSH2, MSH3, MSH6, MUTYH, NBN, NF1, NF2, NTHL1, PALB2, PHOX2B, PMS2, POT1, PRKAR1A, PTCH1, PTEN, RAD51C, RAD51D, RB1, RECQL, RET, SDHA, SDHAF2, SDHB, SDHC, SDHD, SMAD4, SMARCA4, SMARCB1, SMARCE1, STK11, SUFU, TMEM127, TP53, TSC1, TSC2, VHL and XRCC2 (sequencing and deletion/duplication); EGFR, EGLN1, HOXB13, KIT, MITF, PDGFRA, POLD1, and POLE (sequencing only); EPCAM and GREM1 (deletion/duplication only).    08/26/2021 Imaging   EXAM: BILATERAL BREAST MRI WITH AND WITHOUT CONTRAST  IMPRESSION: 2.3 cm mass in the lower-inner quadrant of the left breast corresponding with the biopsy proven invasive ductal carcinoma.   08/31/2021 PET scan   IMPRESSION: 1. Known mass medially in the left breast is markedly hypermetabolic consistent with breast cancer. 2. No evidence of nodal metastases within the chest. No typical distant metastases. 3. Suspected acute inflammatory process involving central mesentery and pelvis with ill-defined focal hypermetabolic activity anterior to the sacrum. There are pelvic  inflammatory changes and free  pelvic fluid. Recent labs demonstrate mild leukocytosis. Differential considerations include acute appendicitis, enteritis and small perforated foreign body. No evidence of drainable fluid collection, pneumoperitoneum or bowel obstruction. 4. These results were called by telephone at the time of interpretation on 08/31/2021 at 5:12 pm to on-call oncology nurse, Lurena Joiner, who verbally acknowledged these results. Unless there is a good clinical explanation for these findings, evaluation in the emergency department is likely warranted, and patient may benefit from abdominopelvic CT with oral and intravenous contrast.   09/01/2021 Imaging   EXAM: CT ABDOMEN AND PELVIS WITH CONTRAST  IMPRESSION: Multiple thick-walled loops of distal small bowel, suggesting infectious/inflammatory enteritis. Associated small volume pelvic ascites. No pneumatosis or free air.   No evidence of bowel obstruction.  Normal appendix.   2.1 cm lesion in the medial left breast, likely corresponding to the patient's known primary breast neoplasm. No findings suspicious for metastatic disease.   09/09/2021 - 03/25/2022 Chemotherapy   Patient is on Treatment Plan : BREAST Pembrolizumab (200) D1 + Carboplatin (5) D1 + Paclitaxel (80) D1,8,15 q21d X 4 cycles / Pembrolizumab (200) D1 + AC D1 q21d x 4 cycles     09/09/2021 -  Chemotherapy   Patient is on Treatment Plan : BREAST Pembrolizumab (200) D1 + Carboplatin (1.5) D1,8,15 + Paclitaxel (80) D1,8,15 q21d X 4 cycles / Pembrolizumab (200) D1 + AC D1 q21d x 4 cycles     03/16/2022 Definitive Surgery   FINAL MICROSCOPIC DIAGNOSIS:   A. LEFT BREAST, LUMPECTOMY:  Negative for residual carcinoma (ypT0)  Changes consistent with neoadjuvant therapy  Changes consistent with prior biopsy   B. LEFT AXILLARY SENTINEL LYMPH NODE, EXCISION:  One benign lymph node, negative for carcinoma (0/1)   C. LEFT AXILLARY SENTINEL LYMPH NODE, EXCISION:  One benign  lymph node, negative for carcinoma (0/1)    03/16/2022 Cancer Staging   Staging form: Breast, AJCC 8th Edition - Pathologic stage from 03/16/2022: No Stage Recommended (ypT0, pN0, cM0) - Signed by Malachy Mood, MD on 05/06/2022 Stage prefix: Post-therapy Response to neoadjuvant therapy: Complete response Residual tumor (R): R0 - None   12/21/2022 Survivorship   SCP delivered by Santiago Glad, NP     INTERVAL HISTORY:  Cheyenne [name] presents to the Survivorship Clinic today for our initial meeting to review her survivorship care plan detailing her treatment course for breast cancer, as well as monitoring long-term side effects of that treatment, education regarding health maintenance, screening, and overall wellness and health promotion.     Overall, Cheyenne [name] reports feeling well since completing Keytruda and cancer treatment. She is having hot flashes and weight gain and difficulty staying asleep. She continues to have bone/body pain since chemo and radiation. No injury. She did not have the pain prior to treatment. She functions normally, continues to work and use infection precautions. She has some pain in left breast since surgery.     REVIEW OF SYSTEMS:  Review of Systems - Oncology Breast: Denies any new nodularity, masses, tenderness, nipple changes, or nipple discharge.      ONCOLOGY TREATMENT TEAM:  1. Surgeon:  Dr. Luisa Hart at [facility] 2. Medical Oncologist: Dr. Mosetta Putt  3. Radiation Oncologist: Dr. Roselind Messier    PAST MEDICAL/SURGICAL HISTORY:  Past Medical History:  Diagnosis Date   Anal fissure    Anxiety    Breast cancer (HCC) 08/10/21   Date diagnosis was given   Complication of anesthesia 03/16/2022   sore on top of mouth   DVT (deep venous thrombosis) (  HCC)    History of radiation therapy    Left breast- 05/05/22- 06/20/22- Dr. Antony Blackbird   Incomplete right bundle branch block (RBBB) determined by electrocardiography 09/01/2021   Personal history of  radiation therapy    Past Surgical History:  Procedure Laterality Date   BREAST LUMPECTOMY     BREAST LUMPECTOMY WITH RADIOACTIVE SEED AND SENTINEL LYMPH NODE BIOPSY Left 03/16/2022   Procedure: LEFT BREAST SEED LUMPECTOMY, LEFT SENTINEL LYMPH NODE MAPPING;  Surgeon: Harriette Bouillon, MD;  Location: [facility];  Service: General;  Laterality: Left;  GEN & PEC BLOCK   PORT-A-CATH REMOVAL N/A 07/20/2022   Procedure: REMOVAL PORT-A-CATH;  Surgeon: Harriette Bouillon, MD;  Location: [facility];  Service: General;  Laterality: N/A;   PORTACATH PLACEMENT N/A 09/08/2021   Procedure: INSERTION PORT-A-CATH;  Surgeon: Harriette Bouillon, MD;  Location: [facility];  Service: General;  Laterality: N/A;     ALLERGIES:  No Known Allergies   CURRENT MEDICATIONS:  Outpatient Encounter Medications as of 12/21/2022  Medication Sig Note   acetaminophen (TYLENOL) 500 MG tablet Take 500-1,000 mg by mouth every 6 (six) hours as needed (pain.).    albuterol (VENTOLIN HFA) 108 (90 Base) MCG/ACT inhaler Inhale 2 puffs into the lungs every 4 (four) hours as needed for wheezing.    ALPRAZolam (XANAX) 0.5 MG tablet Take one tablet twice daily as needed for anxiety.    cyclobenzaprine (FLEXERIL) 10 MG tablet Take 10 mg by mouth daily as needed for muscle spasms.    desonide (DESOWEN) 0.05 % cream Apply topically 2 (two) times daily. (Patient taking differently: Apply 1 application  topically 2 (two) times daily as needed (skin irritation.).)    desonide (DESOWEN) 0.05 % lotion Apply 1 application. topically daily as needed (acne).    EPINEPHrine 0.3 mg/0.3 mL IJ SOAJ injection Inject 0.3 mg into the muscle as needed for anaphylaxis.    hydroquinone 4 % cream APPLY TO DARK SPOTS AT BEDTIME    ibuprofen (ADVIL) 800 MG tablet Take 1 tablet (800 mg total) by mouth every 8 (eight) hours as needed.    ibuprofen (ADVIL) 800 MG tablet Take 1 tablet (800 mg total) by mouth every 8 (eight) hours as needed.     KLOR-CON M20 20 MEQ tablet TAKE 1 TABLET BY MOUTH TWICE A DAY    loratadine (CLARITIN) 10 MG tablet Take 10 mg by mouth daily. 09/04/2021: New medication patient has not started will start with chemo   Multiple Vitamins-Minerals (ONE A DAY IMMUNITY DEFENSE) CHEW Chew 1 tablet by mouth daily.    traMADol (ULTRAM) 50 MG tablet Take 1 tablet (50 mg total) by mouth every 6 (six) hours as needed.    [DISCONTINUED] lidocaine-prilocaine (EMLA) cream Apply topically daily.    [DISCONTINUED] magic mouthwash (nystatin, diphenhydrAMINE, alum & mag hydroxide) suspension mixture Swish and swallow 5 mLs by mouth 3 (three) times daily as needed for mouth pain. (Patient not taking: Reported on 04/14/2022)    [DISCONTINUED] oxyCODONE (OXY IR/ROXICODONE) 5 MG immediate release tablet Take 1 tablet (5 mg total) by mouth every 6 (six) hours as needed for severe pain. (Patient not taking: Reported on 04/14/2022)    [DISCONTINUED] prochlorperazine (COMPAZINE) 10 MG tablet Take 1 tablet (10 mg total) by mouth every 6 (six) hours as needed (Nausea or vomiting). 12/02/2021: Hasn't started   No facility-administered encounter medications on file as of 12/21/2022.     ONCOLOGIC FAMILY HISTORY:  Family History  Problem Relation Age of Onset  Hypertension Mother    Edema Mother    Diabetes Mother    Obesity Mother    Diabetes Sister    Obesity Sister    Breast cancer Other 62   Breast cancer Other 88       dx. with breast cancer again at age 18 (unsure if recurrence or second primary)   Colon cancer Neg Hx    Esophageal cancer Neg Hx    Rectal cancer Neg Hx      GENETIC COUNSELING/TESTING: Yes, negative   SOCIAL HISTORY:  Cheyenne [name] is married and lives [place] her [place]  Cheyenne [name] is currently working at [employer].  She denies any current or history of tobacco, alcohol, or illicit drug use.     PHYSICAL EXAMINATION:  Vital Signs:   Vitals:   12/21/22 1229  BP: 115/73   Pulse: 89  Resp: 18  Temp: 97.9 F (36.6 C)  SpO2: 100%   Filed Weights   12/21/22 1229  Weight: 204 lb (92.5 kg)   General: Well-nourished, well-appearing female in no acute distress.  HEENT:  Sclerae anicteric.  Lymph: No cervical, supraclavicular, or infraclavicular lymphadenopathy noted on palpation.  Cardiovascular: Regular rate and rhythm.Marland Kitchen Respiratory: Clear; breathing non-labored.  GI: Abdomen soft and round; non-tender, non-distended. Bowel sounds normoactive.  Neuro: No focal deficits. Steady gait.  Psych: Mood and affect normal and appropriate for situation.  Extremities: No edema. MSK: No focal spinal tenderness to palpation.  Full range of motion in bilateral upper extremities. B/l ttp anterior low ribs Skin: Warm and dry. Breast: No nipple discharge or inversion. S/p left lumpectomy and radiation, incisions completely healed. Mild diffuse hyperpigmentation. No palpable mass or nodularity in either breast or axilla that I could appreciate   LABORATORY DATA:     Latest Ref Rng & Units 12/21/2022   12:08 PM 08/20/2022    9:26 AM 07/30/2022    8:52 AM  CBC  WBC 4.0 - 10.5 K/uL 7.7  5.6  5.8   Hemoglobin 12.0 - 15.0 g/dL 09.8  11.9  14.7   Hematocrit 36.0 - 46.0 % 34.0  38.7  34.0   Platelets 150 - 400 K/uL 283  272  245        Latest Ref Rng & Units 12/21/2022   12:08 PM 08/20/2022    9:26 AM 07/30/2022    8:52 AM  CMP  Glucose 70 - 99 mg/dL 829  77  562   BUN 6 - 20 mg/dL 12  13  19    Creatinine 0.44 - 1.00 mg/dL 1.30  8.65  7.84   Sodium 135 - 145 mmol/L 140  139  138   Potassium 3.5 - 5.1 mmol/L 3.2  3.6  3.4   Chloride 98 - 111 mmol/L 103  102  103   CO2 22 - 32 mmol/L 30  29  25    Calcium 8.9 - 10.3 mg/dL 9.3  69.6  9.7   Total Protein 6.5 - 8.1 g/dL 7.4  7.7  7.6   Total Bilirubin 0.3 - 1.2 mg/dL 0.6  0.6  0.5   Alkaline Phos 38 - 126 U/L 97  101  85   AST 15 - 41 U/L 20  19  18    ALT 0 - 44 U/L 20  20  17       DIAGNOSTIC IMAGING:  None for this  visit.      ASSESSMENT AND PLAN:  Cheyenne [name] is a pleasant 42 y.o.  female with Stage II left breast invasive ductal carcinoma, functionally triple negative diagnosed in 08/2021, treated with neoadjuvant chemotherapy, immunotherapy, lumpectomy, and adjuvant radiation therapy which she completed in 08/2022.  She presents to the Survivorship Clinic for our initial meeting and routine follow-up post-completion of treatment for breast cancer.    1. Stage II left breast cancer:  Cheyenne [name] is continuing to recover from definitive treatment for breast cancer. Exam is benign. Recent mammogram 09/2022 is negative. She will follow-up with her medical oncologist, Dr. Mosetta Putt in 4 months with history and physical exam per surveillance protocol.  Today, a comprehensive survivorship care plan and treatment summary was reviewed with the patient today detailing her breast cancer diagnosis, treatment course, potential late/long-term effects of treatment, appropriate follow-up care with recommendations for the future, and patient education resources.  A copy of this summary, along with a letter will be sent to the patient's primary care provider via mail/fax/In Basket message after today's visit.    2. Generalized arthralgia: She has generalized body pain from chemo, pain was not present prior to treatment. Most in bilateral anterior low ribs, also in elbows and knees. Will do rib Xray today. Continue symptom management with tylenol PRN; can use heat. I also encouraged her to remain active   3. Bone health:  Menses stopped during chemo, have not returned. She has hot flashes, weight gain, and difficulty staying asleep. She is likely perimenopausal. Will start DEXA when post-menopause. In the meantime, she was encouraged to increase her consumption of foods rich in calcium, as well as increase her weight-bearing activities.  She was given education on specific activities to promote bone health.  #. Cancer screening:  Due  to Cheyenne [name]'s history and her age, she should receive screening for skin cancers, colon cancer, and gynecologic cancers.  The information and recommendations are listed on the patient's comprehensive care plan/treatment summary and were reviewed in detail with the patient.    3. Health maintenance and wellness promotion: Cheyenne [name] was encouraged to consume 5-7 servings of fruits and vegetables per day. She was also encouraged to engage in moderate to vigorous exercise for 30 minutes per day most days of the week. She was instructed to limit her alcohol consumption and continue to abstain from tobacco use.  4. Support services/counseling: It is not uncommon for this period of the patient's cancer care trajectory to be one of many emotions and stressors.  We discussed an opportunity for her to participate in the next session of [facility] ("Finding Your New Normal") support group series designed for patients after they have completed treatment.   Cheyenne [name] was encouraged to take advantage of our many other support services programs, support groups, and/or counseling in coping with her new life as a cancer survivor after completing anti-cancer treatment.  She was offered support today through active listening and expressive supportive counseling.  She was given information regarding our available services and encouraged to contact me with any questions or for help enrolling in any of our support group/programs.    Dispo:   -Xray today, will call with results. If negative, continue symptom management  -Return to cancer center 4 months  -Mammogram due in 09/2023 -Follow up with surgery as scheduled  -She is welcome to return back to the Survivorship Clinic at any time; no additional follow-up needed at this time.  -Consider referral back to survivorship as a long-term survivor for continued surveillance  A total of (30) minutes of face-to-face time was spent  with this patient with greater than 50% of  that time in counseling and care-coordination.   Santiago Glad, NP Survivorship Program [facility] [phone]   Note: PRIMARY CARE PROVIDER Deeann Saint, MD [phone]

## 2022-12-22 ENCOUNTER — Telehealth: Payer: Self-pay | Admitting: Hematology

## 2022-12-22 LAB — CANCER ANTIGEN 27.29: CA 27.29: 17.9 U/mL (ref 0.0–38.6)

## 2022-12-22 NOTE — Telephone Encounter (Signed)
Contacted patient to scheduled appointments. Left message with appointment details and a call back number if patient had any questions or could not accommodate the time we provided.   

## 2022-12-24 ENCOUNTER — Ambulatory Visit: Payer: Federal, State, Local not specified - PPO | Attending: Surgery

## 2022-12-24 ENCOUNTER — Other Ambulatory Visit: Payer: Self-pay

## 2022-12-24 DIAGNOSIS — Z483 Aftercare following surgery for neoplasm: Secondary | ICD-10-CM | POA: Insufficient documentation

## 2022-12-24 NOTE — Therapy (Signed)
  OUTPATIENT PHYSICAL THERAPY SOZO SCREENING NOTE   Patient Name: Cheyenne [name] MRN: [id number] DOB:1980/12/31, 42 y.o., female Today's Date: 12/24/2022  PCP: Deeann Saint, MD REFERRING PROVIDER: Harriette Bouillon, MD   PT End of Session - 12/24/22 0758     Visit Number 7   unchanged due to screen   PT Start Time 0800    PT Stop Time 0806    PT Time Calculation (min) 6 min    Activity Tolerance Patient tolerated treatment well    Behavior During Therapy [facility] for tasks assessed/performed             Past Medical History:  Diagnosis Date   Anal fissure    Anxiety    Breast cancer (HCC) 08/10/21   Date diagnosis was given   Complication of anesthesia 03/16/2022   sore on top of mouth   DVT (deep venous thrombosis) (HCC)    History of radiation therapy    Left breast- 05/05/22- 06/20/22- Dr. Antony Blackbird   Incomplete right bundle branch block (RBBB) determined by electrocardiography 09/01/2021   Personal history of radiation therapy    Past Surgical History:  Procedure Laterality Date   BREAST LUMPECTOMY     BREAST LUMPECTOMY WITH RADIOACTIVE SEED AND SENTINEL LYMPH NODE BIOPSY Left 03/16/2022   Procedure: LEFT BREAST SEED LUMPECTOMY, LEFT SENTINEL LYMPH NODE MAPPING;  Surgeon: Harriette Bouillon, MD;  Location: [facility];  Service: General;  Laterality: Left;  GEN & PEC BLOCK   PORT-A-CATH REMOVAL N/A 07/20/2022   Procedure: REMOVAL PORT-A-CATH;  Surgeon: Harriette Bouillon, MD;  Location: [facility];  Service: General;  Laterality: N/A;   PORTACATH PLACEMENT N/A 09/08/2021   Procedure: INSERTION PORT-A-CATH;  Surgeon: Harriette Bouillon, MD;  Location: [facility];  Service: General;  Laterality: N/A;   Patient Active Problem List   Diagnosis Date Noted   Port-A-Cath in place 09/16/2021   Genetic testing 08/31/2021   Malignant neoplasm of lower-inner quadrant of left breast in female, estrogen receptor positive (HCC) 08/17/2021   Anxiety  08/06/2019   Plantar fasciitis 08/06/2019   Fluid collection (edema) in the arms, legs, hands and feet 05/02/2018    REFERRING DIAG: left breast cancer at risk for lymphedema  THERAPY DIAG:  Aftercare following surgery for neoplasm  PERTINENT HISTORY:   Patient was diagnosed on 08/07/2021 with left grade III invasive ductal carcinoma breast cancer. It measures 2.8 cm and is located in the lower inner quadrant. It is weakly (2%) ER positive, PR and HER2 negative with a Ki67 of 40%. 03/16/22- L breast seed lumpectomy and SLNB 0/2. Had post op seroma with drainage from inframammary incision as well as an additional one inferior to axillary incision   PRECAUTIONS: left UE Lymphedema risk, None  SUBJECTIVE: No problems with anything  PAIN:  Are you having pain? No  SOZO SCREENING: Patient was assessed today using the SOZO machine to determine the lymphedema index score. This was compared to her baseline score. It was determined that she is within the recommended range when compared to her baseline and no further action is needed at this time. She will continue SOZO screenings. These are done every 3 months for 2 years post operatively followed by every 6 months for 2 years, and then annually.     Waynette Buttery, PT 12/24/2022, 8:07 AM

## 2022-12-27 ENCOUNTER — Other Ambulatory Visit: Payer: Self-pay

## 2023-01-02 ENCOUNTER — Encounter: Payer: Self-pay | Admitting: Nurse Practitioner

## 2023-01-02 ENCOUNTER — Encounter: Payer: Self-pay | Admitting: Physical Therapy

## 2023-01-10 ENCOUNTER — Encounter: Payer: Self-pay | Admitting: Hematology

## 2023-01-17 ENCOUNTER — Ambulatory Visit (INDEPENDENT_AMBULATORY_CARE_PROVIDER_SITE_OTHER): Payer: Federal, State, Local not specified - PPO | Admitting: Nurse Practitioner

## 2023-01-17 ENCOUNTER — Encounter: Payer: Self-pay | Admitting: Nurse Practitioner

## 2023-01-17 VITALS — BP 118/60 | HR 83 | Ht 62.0 in | Wt 201.0 lb

## 2023-01-17 DIAGNOSIS — K602 Anal fissure, unspecified: Secondary | ICD-10-CM

## 2023-01-17 MED ORDER — DILTIAZEM GEL 2 %: 1.0000 | Freq: Three times a day (TID) | CUTANEOUS | 0 refills | Status: DC

## 2023-01-17 NOTE — Progress Notes (Signed)
Assessment and Plan   Primary GI: Ileene Patrick, MD  Brief Narrative:  42 y.o.  female whose past medical history includes but is not necessarily limited to anal fissure, anxiety, DVT, stage II left breast diagnosed January 2023, treated with neoadjuvant chemotherapy, immunotherapy, lumpectomy and adjuvant radiation therapy completed in January 2024  Recurrent painless rectal bleeding 2/2 recurrent vrs persistent anterior midline fissure with scant oozing on exam today.  Diltiazem 2% with lidocaine ointment -apply three times a day for 8 weeks as instructed in the office.  Start taking 1-2  stool softeners ( Docusate sodium) at bedtime Avoid straining. Trial of glycerin suppositories as needed  Start daily Metamucil  Drink 60 ounces of water dail Call office if not improving within a few weeks or if recurrent symptoms after completion of treatment   History of Present Illness   Chief complaint:  Recent rectal bleeding.   Patient called office 12/10/2022 as advised by her OB/GYN to be seen for rectal bleeding  Around the first of May patient began having painless rectal bleeding with BMs.There was blood on tissue and in the stool. She wasn't constipated or straining when bleeding started (stools are soft and she rarely strains) . She bled almost every day for two weeks and then the bleeding stopped spontaneously.. No bleeding in two weeks. She feels fine.  She was diagnosed and treated for breast cancer since we saw her last   Previous GI Endoscopies / Labs / Imaging   08/25/2020 colonoscopy for rectal bleeding and persistent anal fissure -Anal fissure in the anterior midline area.  3 mm sigmoid polyp, sessile.  Removed.  Internal hemorrhoids. Path-tubular adenoma.  Follow-up colonoscopy in 7 years     Latest Ref Rng & Units 12/21/2022   12:08 PM 08/20/2022    9:26 AM 07/30/2022    8:52 AM  Hepatic Function  Total Protein 6.5 - 8.1 g/dL 7.4  7.7  7.6   Albumin 3.5 - 5.0 g/dL  4.2  4.3  4.0   AST 15 - 41 U/L 20  19  18    ALT 0 - 44 U/L 20  20  17    Alk Phosphatase 38 - 126 U/L 97  101  85   Total Bilirubin 0.3 - 1.2 mg/dL 0.6  0.6  0.5        Latest Ref Rng & Units 12/21/2022   12:08 PM 08/20/2022    9:26 AM 07/30/2022    8:52 AM  CBC  WBC 4.0 - 10.5 K/uL 7.7  5.6  5.8   Hemoglobin 12.0 - 15.0 g/dL 16.1  09.6  04.5   Hematocrit 36.0 - 46.0 % 34.0  38.7  34.0   Platelets 150 - 400 K/uL 283  272  245      Past Medical History:  Diagnosis Date   Anal fissure    Anxiety    Breast cancer (HCC) 08/10/21   Date diagnosis was given   Complication of anesthesia 03/16/2022   sore on top of mouth   DVT (deep venous thrombosis) (HCC)    History of radiation therapy    Left breast- 05/05/22- 06/20/22- Dr. Antony Blackbird   Incomplete right bundle branch block (RBBB) determined by electrocardiography 09/01/2021   Personal history of radiation therapy     Past Surgical History:  Procedure Laterality Date   BREAST LUMPECTOMY     BREAST LUMPECTOMY WITH RADIOACTIVE SEED AND SENTINEL LYMPH NODE BIOPSY Left 03/16/2022   Procedure: LEFT BREAST SEED LUMPECTOMY,  LEFT SENTINEL LYMPH NODE MAPPING;  Surgeon: Harriette Bouillon, MD;  Location: [facility];  Service: General;  Laterality: Left;  GEN & PEC BLOCK   PORT-A-CATH REMOVAL N/A 07/20/2022   Procedure: REMOVAL PORT-A-CATH;  Surgeon: Harriette Bouillon, MD;  Location: [facility];  Service: General;  Laterality: N/A;   PORTACATH PLACEMENT N/A 09/08/2021   Procedure: INSERTION PORT-A-CATH;  Surgeon: Harriette Bouillon, MD;  Location: [facility];  Service: General;  Laterality: N/A;    Current Medications, Allergies, Family History and Social History were reviewed in [employer] electronic medical record.     Current Outpatient Medications  Medication Sig Dispense Refill   acetaminophen (TYLENOL) 500 MG tablet Take 500-1,000 mg by mouth every 6 (six) hours as needed (pain.).     albuterol (VENTOLIN  HFA) 108 (90 Base) MCG/ACT inhaler Inhale 2 puffs into the lungs every 4 (four) hours as needed for wheezing. 1 each 5   ALPRAZolam (XANAX) 0.5 MG tablet Take one tablet twice daily as needed for anxiety. 45 tablet 2   cyclobenzaprine (FLEXERIL) 10 MG tablet Take 10 mg by mouth daily as needed for muscle spasms.     desonide (DESOWEN) 0.05 % cream Apply topically 2 (two) times daily. (Patient taking differently: Apply 1 application  topically 2 (two) times daily as needed (skin irritation.).) 30 g 1   desonide (DESOWEN) 0.05 % lotion Apply 1 application. topically daily as needed (acne).     EPINEPHrine 0.3 mg/0.3 mL IJ SOAJ injection Inject 0.3 mg into the muscle as needed for anaphylaxis. 1 each 0   hydroquinone 4 % cream APPLY TO DARK SPOTS AT BEDTIME 28.35 g 0   ibuprofen (ADVIL) 800 MG tablet Take 1 tablet (800 mg total) by mouth every 8 (eight) hours as needed. 30 tablet 0   ibuprofen (ADVIL) 800 MG tablet Take 1 tablet (800 mg total) by mouth every 8 (eight) hours as needed. 30 tablet 0   KLOR-CON M20 20 MEQ tablet TAKE 1 TABLET BY MOUTH TWICE A DAY 180 tablet 1   loratadine (CLARITIN) 10 MG tablet Take 10 mg by mouth daily.     Multiple Vitamins-Minerals (ONE A DAY IMMUNITY DEFENSE) CHEW Chew 1 tablet by mouth daily.     traMADol (ULTRAM) 50 MG tablet Take 1 tablet (50 mg total) by mouth every 6 (six) hours as needed. 5 tablet 0   No current facility-administered medications for this visit.    Review of Systems: No chest pain. No shortness of breath. No urinary complaints.    Physical Exam  Wt Readings from Last 3 Encounters:  01/17/23 201 lb (91.2 kg)  12/21/22 204 lb (92.5 kg)  09/27/22 192 lb (87.1 kg)    Ht 5\' 2"  (1.575 m)   Wt 201 lb (91.2 kg)   BMI 36.76 kg/m  Constitutional:  Pleasant, generally well appearing female in no acute distress. Psychiatric: Normal mood and affect. Behavior is normal. EENT: Pupils normal.  Conjunctivae are normal. No scleral icterus. Neck  supple.  Cardiovascular: Normal rate, regular rhythm.  Pulmonary/chest: Effort normal and breath sounds normal. No wheezing, rales or rhonchi. Abdominal: Soft, nondistended, nontender. Bowel sounds active throughout. There are no masses palpable. No hepatomegaly. Rectal: anterior midline fissure with scant bleeding was present. DRE not done.  Neurological: Alert and oriented to person place and time.  Skin: Skin is warm and dry. No rashes noted.  Willette Cluster, NP  01/17/2023, 1:37 PM

## 2023-01-17 NOTE — Patient Instructions (Addendum)
_______________________________________________________  If your blood pressure at your visit was 140/90 or greater, please contact your primary care physician to follow up on this.  If you are age 41 or younger, your body mass index should be between 19-25. Your Body mass index is 36.76 kg/m. If this is out of the aformentioned range listed, please consider follow up with your Primary Care Provider.  ________________________________________________________  The [facility] providers would like to encourage you to use [facility] to communicate with providers for non-urgent requests or questions.  Due to long hold times on the telephone, sending your provider a message by [facility] may be a faster and more efficient way to get a response.  Please allow 48 business hours for a response.  Please remember that this is for non-urgent requests.  _______________________________________________________  We have sent a prescription for Diltiazem 2% gel to [facility] for you. Using your index finger, you should apply a small amount of medication inside the rectum up to your first knuckle/joint three times daily x 8 weeks.  [facility]'s information is below: Address: [address], [place], [place] [address]  [phone]  *Please DO NOT go directly from our office to pick up this medication! Give the pharmacy 1 day to process the prescription as this is compounded and takes time to make.  Diltiazem 2% ointment -apply three times daily for 8 weeks as instructed at clinic visit.  Warm tub soaks for 10-15 minutes 2-3 times daily. Make sure you dry area afterwards Daily Metamucil 1-2  stool softeners at bedtime 60 ounces of water daily Call office if symptoms are not improving within a few weeks Call us for any recurrent bleeding after completion of treatment.   Thank you for entrusting me with your care and choosing [facility].  Gunnar Fusi, NP

## 2023-01-17 NOTE — Progress Notes (Signed)
Agree with assessment and plan as outlined.  If she has persistent symptoms would proceed with colonoscopy.

## 2023-01-21 DIAGNOSIS — C50312 Malignant neoplasm of lower-inner quadrant of left female breast: Secondary | ICD-10-CM | POA: Diagnosis not present

## 2023-01-27 DIAGNOSIS — C50312 Malignant neoplasm of lower-inner quadrant of left female breast: Secondary | ICD-10-CM | POA: Diagnosis not present

## 2023-01-30 ENCOUNTER — Other Ambulatory Visit: Payer: Self-pay | Admitting: Hematology

## 2023-01-31 ENCOUNTER — Encounter: Payer: Self-pay | Admitting: Hematology

## 2023-02-16 ENCOUNTER — Telehealth: Payer: Self-pay

## 2023-02-16 NOTE — Telephone Encounter (Signed)
Faxed a copy of signed order to [facility] for mastectomy products as per Dr. Mosetta Putt. Received fax confirmation and placed original in the to be scanned file.

## 2023-03-04 ENCOUNTER — Ambulatory Visit: Payer: Federal, State, Local not specified - PPO | Attending: Surgery | Admitting: Physical Therapy

## 2023-03-04 ENCOUNTER — Encounter: Payer: Self-pay | Admitting: Nurse Practitioner

## 2023-03-04 ENCOUNTER — Encounter: Payer: Self-pay | Admitting: Physical Therapy

## 2023-03-04 DIAGNOSIS — C50312 Malignant neoplasm of lower-inner quadrant of left female breast: Secondary | ICD-10-CM | POA: Insufficient documentation

## 2023-03-04 DIAGNOSIS — Z170 Estrogen receptor positive status [ER+]: Secondary | ICD-10-CM | POA: Insufficient documentation

## 2023-03-04 NOTE — Therapy (Signed)
  OUTPATIENT PHYSICAL THERAPY SOZO SCREENING NOTE   Patient Name: Cheyenne [name] MRN: [id number] DOB:Jun 02, 1981, 42 y.o., female Today's Date: 03/04/2023  PCP: Deeann Saint, MD REFERRING PROVIDER: Harriette Bouillon, MD   PT End of Session - 03/04/23 [phone]     Visit Number 7   screen only   PT Start Time 0802    PT Stop Time 0807    PT Time Calculation (min) 5 min    Activity Tolerance Patient tolerated treatment well    Behavior During Therapy [facility] for tasks assessed/performed              Past Medical History:  Diagnosis Date   Anal fissure    Anxiety    Breast cancer (HCC) 08/10/21   Date diagnosis was given   Complication of anesthesia 03/16/2022   sore on top of mouth   DVT (deep venous thrombosis) (HCC)    History of radiation therapy    Left breast- 05/05/22- 06/20/22- Dr. Antony Blackbird   Incomplete right bundle branch block (RBBB) determined by electrocardiography 09/01/2021   Personal history of radiation therapy    Past Surgical History:  Procedure Laterality Date   BREAST LUMPECTOMY     BREAST LUMPECTOMY WITH RADIOACTIVE SEED AND SENTINEL LYMPH NODE BIOPSY Left 03/16/2022   Procedure: LEFT BREAST SEED LUMPECTOMY, LEFT SENTINEL LYMPH NODE MAPPING;  Surgeon: Harriette Bouillon, MD;  Location: [facility];  Service: General;  Laterality: Left;  GEN & PEC BLOCK   PORT-A-CATH REMOVAL N/A 07/20/2022   Procedure: REMOVAL PORT-A-CATH;  Surgeon: Harriette Bouillon, MD;  Location: [facility];  Service: General;  Laterality: N/A;   PORTACATH PLACEMENT N/A 09/08/2021   Procedure: INSERTION PORT-A-CATH;  Surgeon: Harriette Bouillon, MD;  Location: [facility];  Service: General;  Laterality: N/A;   Patient Active Problem List   Diagnosis Date Noted   Port-A-Cath in place 09/16/2021   Genetic testing 08/31/2021   Malignant neoplasm of lower-inner quadrant of left breast in female, estrogen receptor positive (HCC) 08/17/2021   Anxiety 08/06/2019    Plantar fasciitis 08/06/2019   Fluid collection (edema) in the arms, legs, hands and feet 05/02/2018    REFERRING DIAG: left breast cancer at risk for lymphedema  THERAPY DIAG:  Malignant neoplasm of lower-inner quadrant of left breast in female, estrogen receptor positive (HCC)  PERTINENT HISTORY:   Patient was diagnosed on 08/07/2021 with left grade III invasive ductal carcinoma breast cancer. It measures 2.8 cm and is located in the lower inner quadrant. It is weakly (2%) ER positive, PR and HER2 negative with a Ki67 of 40%. 03/16/22- L breast seed lumpectomy and SLNB 0/2. Had post op seroma with drainage from inframammary incision as well as an additional one inferior to axillary incision   PRECAUTIONS: left UE Lymphedema risk, None  SUBJECTIVE: No problems with anything  PAIN:  Are you having pain? No  SOZO SCREENING: Patient was assessed today using the SOZO machine to determine the lymphedema index score. This was compared to her baseline score. It was determined that she is within the recommended range when compared to her baseline and no further action is needed at this time. She will continue SOZO screenings. These are done every 3 months for 2 years post operatively followed by every 6 months for 2 years, and then annually.     [employer], PT 03/04/2023, 8:08 AM

## 2023-03-05 ENCOUNTER — Other Ambulatory Visit: Payer: Self-pay

## 2023-03-07 ENCOUNTER — Other Ambulatory Visit: Payer: Self-pay | Admitting: Nurse Practitioner

## 2023-03-07 DIAGNOSIS — C50312 Malignant neoplasm of lower-inner quadrant of left female breast: Secondary | ICD-10-CM

## 2023-04-20 ENCOUNTER — Inpatient Hospital Stay: Payer: Federal, State, Local not specified - PPO | Attending: Hematology

## 2023-04-20 ENCOUNTER — Inpatient Hospital Stay (HOSPITAL_BASED_OUTPATIENT_CLINIC_OR_DEPARTMENT_OTHER): Payer: Federal, State, Local not specified - PPO | Admitting: Hematology

## 2023-04-20 ENCOUNTER — Encounter: Payer: Self-pay | Admitting: Hematology

## 2023-04-20 VITALS — BP 114/71 | HR 86 | Temp 98.5°F | Resp 16 | Ht 62.0 in | Wt 209.6 lb

## 2023-04-20 DIAGNOSIS — C50312 Malignant neoplasm of lower-inner quadrant of left female breast: Secondary | ICD-10-CM | POA: Insufficient documentation

## 2023-04-20 DIAGNOSIS — E039 Hypothyroidism, unspecified: Secondary | ICD-10-CM | POA: Insufficient documentation

## 2023-04-20 DIAGNOSIS — Z170 Estrogen receptor positive status [ER+]: Secondary | ICD-10-CM

## 2023-04-20 LAB — CBC WITH DIFFERENTIAL (CANCER CENTER ONLY)
Abs Immature Granulocytes: 0.03 10*3/uL (ref 0.00–0.07)
Basophils Absolute: 0 10*3/uL (ref 0.0–0.1)
Basophils Relative: 0 %
Eosinophils Absolute: 0.2 10*3/uL (ref 0.0–0.5)
Eosinophils Relative: 2 %
HCT: 35.9 % — ABNORMAL LOW (ref 36.0–46.0)
Hemoglobin: 11.9 g/dL — ABNORMAL LOW (ref 12.0–15.0)
Immature Granulocytes: 0 %
Lymphocytes Relative: 33 %
Lymphs Abs: 2.5 10*3/uL (ref 0.7–4.0)
MCH: 31.1 pg (ref 26.0–34.0)
MCHC: 33.1 g/dL (ref 30.0–36.0)
MCV: 93.7 fL (ref 80.0–100.0)
Monocytes Absolute: 0.5 10*3/uL (ref 0.1–1.0)
Monocytes Relative: 6 %
Neutro Abs: 4.4 10*3/uL (ref 1.7–7.7)
Neutrophils Relative %: 59 %
Platelet Count: 294 10*3/uL (ref 150–400)
RBC: 3.83 MIL/uL — ABNORMAL LOW (ref 3.87–5.11)
RDW: 13.2 % (ref 11.5–15.5)
WBC Count: 7.6 10*3/uL (ref 4.0–10.5)
nRBC: 0 % (ref 0.0–0.2)

## 2023-04-20 LAB — CMP (CANCER CENTER ONLY)
ALT: 15 U/L (ref 0–44)
AST: 17 U/L (ref 15–41)
Albumin: 4 g/dL (ref 3.5–5.0)
Alkaline Phosphatase: 101 U/L (ref 38–126)
Anion gap: 6 (ref 5–15)
BUN: 16 mg/dL (ref 6–20)
CO2: 30 mmol/L (ref 22–32)
Calcium: 9.4 mg/dL (ref 8.9–10.3)
Chloride: 104 mmol/L (ref 98–111)
Creatinine: 0.87 mg/dL (ref 0.44–1.00)
GFR, Estimated: >60 mL/min (ref >60)
Glucose, Bld: 103 mg/dL — ABNORMAL HIGH (ref 70–99)
Potassium: 3.7 mmol/L (ref 3.5–5.1)
Sodium: 140 mmol/L (ref 135–145)
Total Bilirubin: 0.4 mg/dL (ref 0.3–1.2)
Total Protein: 7.5 g/dL (ref 6.5–8.1)

## 2023-04-20 LAB — T4, FREE: Free T4: 0.89 ng/dL (ref 0.61–1.12)

## 2023-04-20 LAB — TSH: TSH: 2.214 u[IU]/mL (ref 0.350–4.500)

## 2023-04-20 NOTE — Assessment & Plan Note (Signed)
Stage IIIA, c(T2, N1), Functionally triple negative, Grade 3, [id number]  -presented with palpable left breast mass. B/l MM and left [place] on 08/07/21 showed: 2.8 cm left breast mass at 7:30; at least 4 abnormal lymph nodes. Biopsy that day confirmed IDC in breast but node was negative. -breast MRI on 08/26/21 and PET on 08/31/21 were negative aside from primary mass.  -port placed 09/08/21  -s/p neoadjuvant carbo/taxol and keytruda on 09/09/21 - 11/25/21; her breast mass became no longer palpable after cycle 3. She then completed Epirubicin/cytoxan 12/02/21 - 02/03/22 (she had allergic reaction to Adriamycin), tolerated moderately well overall. -left lumpectomy 03/16/22 with Dr. Luisa Hart showed no residual carcinoma, indicating complete response. -she completed one year Keytruda on 08/19/22. -she completed radiation under Dr. Roselind Messier on 06/20/22. -Continue breast cancer surveillance

## 2023-04-20 NOTE — Progress Notes (Signed)
[facility]   Telephone:([phone]) [phone] Fax:(336) [phone]   Clinic Follow up Note   Patient Care Team: Deeann Saint, MD as PCP - General (Family Medicine) Harriette Bouillon, MD as Consulting Physician (General Surgery) Malachy Mood, MD as Consulting Physician (Hematology) Antony Blackbird, MD as Consulting Physician (Radiation Oncology) Pollyann Samples, NP as Nurse Practitioner (Nurse Practitioner)  Date of Service:  04/20/2023  CHIEF COMPLAINT: f/u of left breast cancer  CURRENT THERAPY:   Breast cancer Surveillance  ASSESSMENT:  Cheyenne [name] is a 42 y.o. female with   Malignant neoplasm of lower-inner quadrant of left breast in female, estrogen receptor positive (HCC) Stage IIIA, c(T2, N1), Functionally triple negative, Grade 3, ypT0N0  -presented with palpable left breast mass. B/l MM and left [place] on 08/07/21 showed: 2.8 cm left breast mass at 7:30; at least 4 abnormal lymph nodes. Biopsy that day confirmed IDC in breast but node was negative. -breast MRI on 08/26/21 and PET on 08/31/21 were negative aside from primary mass.  -port placed 09/08/21  -s/p neoadjuvant carbo/taxol and keytruda on 09/09/21 - 11/25/21; her breast mass became no longer palpable after cycle 3. She then completed Epirubicin/cytoxan 12/02/21 - 02/03/22 (she had allergic reaction to Adriamycin), tolerated moderately well overall. -left lumpectomy 03/16/22 with Dr. Luisa Hart showed no residual carcinoma, indicating complete response. -she completed one year Keytruda on 08/19/22. -she completed radiation under Dr. Roselind Messier on 06/20/22. -Continue breast cancer surveillance, she is clinically doing well, no concerns for recurrence    PLAN: -lab reviewed -CMP-pending -Continue annual mammogram -I discuss ctDNA [place] for her cancer monitoring, she is interested if no cost to her                                                                                                 -I referred to PT  - lab and f/u  in 4 months  SUMMARY OF ONCOLOGIC HISTORY: Oncology History Overview Note   Cancer Staging  Malignant neoplasm of lower-inner quadrant of left breast in female, estrogen receptor positive (HCC) Staging form: Breast, AJCC 8th Edition - Clinical stage from 08/07/2021: Stage IIIA (cT2, cN1, cM0, G3, ER+, PR-, HER2-) - Signed by Malachy Mood, MD on 08/18/2021    Malignant neoplasm of lower-inner quadrant of left breast in female, estrogen receptor positive (HCC)  08/07/2021 Cancer Staging   Staging form: Breast, AJCC 8th Edition - Clinical stage from 08/07/2021: Stage IIB (cT2, cN0, cM0, G3, ER-, PR-, HER2-) - Signed by Malachy Mood, MD on 09/24/2021 Stage prefix: Initial diagnosis Histologic grading system: 3 grade system   08/07/2021 Mammogram   CLINICAL DATA:  Palpable mass in the LEFT breast since August.   EXAM: DIGITAL DIAGNOSTIC BILATERAL MAMMOGRAM WITH TOMOSYNTHESIS AND CAD; ULTRASOUND LEFT BREAST LIMITED  IMPRESSION: 1. Suspicious mass in the 7:30 o'clock location of the LEFT breast for which biopsy is recommended. 2. LEFT axillary adenopathy, with at least 4 abnormal lymph nodes.   08/07/2021 Initial Biopsy   Diagnosis 1. Breast, left, needle core biopsy, 7:30, ribbon clip - INVASIVE DUCTAL CARCINOMA - SEE COMMENT 2. Lymph node, needle/core  biopsy, left axilla, coil clip - NO CARCINOMA IDENTIFIED Microscopic Comment 1. based on the biopsy, the carcinoma appears Nottingham grade 3 of 3 and measures 1.1 cm in greatest linear extent.  1. PROGNOSTIC INDICATORS Results: The tumor cells are EQUIVOCAL for Her2 (2+). Her2 by FISH will be performed and results reported separately. Estrogen Receptor: 2%, POSITIVE, WEAK STAINING INTENSITY Progesterone Receptor: <1%, NEGATIVE Proliferation Marker Ki67: 40%  1. FLUORESCENCE IN-SITU HYBRIDIZATION Results: GROUP 5: HER2 **NEGATIVE**   08/17/2021 Initial Diagnosis   Malignant neoplasm of lower-inner quadrant of left breast in female, estrogen  receptor positive (HCC)   08/19/2021 Genetic Testing   [facility] CancerNext-Expanded Panel is Negative. Report date is 09/02/2021.  The CancerNext-Expanded gene panel offered by [facility] and includes sequencing, rearrangement, and RNA analysis for the following 77 genes: AIP, ALK, APC, ATM, AXIN2, BAP1, BARD1, BLM, BMPR1A, BRCA1, BRCA2, BRIP1, CDC73, CDH1, CDK4, CDKN1B, CDKN2A, CHEK2, CTNNA1, DICER1, FANCC, FH, FLCN, GALNT12, KIF1B, LZTR1, MAX, MEN1, MET, MLH1, MSH2, MSH3, MSH6, MUTYH, NBN, NF1, NF2, NTHL1, PALB2, PHOX2B, PMS2, POT1, PRKAR1A, PTCH1, PTEN, RAD51C, RAD51D, RB1, RECQL, RET, SDHA, SDHAF2, SDHB, SDHC, SDHD, SMAD4, SMARCA4, SMARCB1, SMARCE1, STK11, SUFU, TMEM127, TP53, TSC1, TSC2, VHL and XRCC2 (sequencing and deletion/duplication); EGFR, EGLN1, HOXB13, KIT, MITF, PDGFRA, POLD1, and POLE (sequencing only); EPCAM and GREM1 (deletion/duplication only).    08/26/2021 Imaging   EXAM: BILATERAL BREAST MRI WITH AND WITHOUT CONTRAST  IMPRESSION: 2.3 cm mass in the lower-inner quadrant of the left breast corresponding with the biopsy proven invasive ductal carcinoma.   08/31/2021 PET scan   IMPRESSION: 1. Known mass medially in the left breast is markedly hypermetabolic consistent with breast cancer. 2. No evidence of nodal metastases within the chest. No typical distant metastases. 3. Suspected acute inflammatory process involving central mesentery and pelvis with ill-defined focal hypermetabolic activity anterior to the sacrum. There are pelvic inflammatory changes and free pelvic fluid. Recent labs demonstrate mild leukocytosis. Differential considerations include acute appendicitis, enteritis and small perforated foreign body. No evidence of drainable fluid collection, pneumoperitoneum or bowel obstruction. 4. These results were called by telephone at the time of interpretation on 08/31/2021 at 5:12 pm to on-call oncology nurse, Lurena Joiner, who verbally acknowledged these results. Unless there  is a good clinical explanation for these findings, evaluation in the emergency department is likely warranted, and patient may benefit from abdominopelvic CT with oral and intravenous contrast.   09/01/2021 Imaging   EXAM: CT ABDOMEN AND PELVIS WITH CONTRAST  IMPRESSION: Multiple thick-walled loops of distal small bowel, suggesting infectious/inflammatory enteritis. Associated small volume pelvic ascites. No pneumatosis or free air.   No evidence of bowel obstruction.  Normal appendix.   2.1 cm lesion in the medial left breast, likely corresponding to the patient's known primary breast neoplasm. No findings suspicious for metastatic disease.   09/09/2021 - 03/25/2022 Chemotherapy   Patient is on Treatment Plan : BREAST Pembrolizumab (200) D1 + Carboplatin (5) D1 + Paclitaxel (80) D1,8,15 q21d X 4 cycles / Pembrolizumab (200) D1 + AC D1 q21d x 4 cycles     09/09/2021 -  Chemotherapy   Patient is on Treatment Plan : BREAST Pembrolizumab (200) D1 + Carboplatin (1.5) D1,8,15 + Paclitaxel (80) D1,8,15 q21d X 4 cycles / Pembrolizumab (200) D1 + AC D1 q21d x 4 cycles     03/16/2022 Definitive Surgery   FINAL MICROSCOPIC DIAGNOSIS:   A. LEFT BREAST, LUMPECTOMY:  Negative for residual carcinoma (ypT0)  Changes consistent with neoadjuvant therapy  Changes consistent with prior biopsy  B. LEFT AXILLARY SENTINEL LYMPH NODE, EXCISION:  One benign lymph node, negative for carcinoma (0/1)   C. LEFT AXILLARY SENTINEL LYMPH NODE, EXCISION:  One benign lymph node, negative for carcinoma (0/1)    03/16/2022 Cancer Staging   Staging form: Breast, AJCC 8th Edition - Pathologic stage from 03/16/2022: No Stage Recommended (ypT0, pN0, cM0) - Signed by Malachy Mood, MD on 05/06/2022 Stage prefix: Post-therapy Response to neoadjuvant therapy: Complete response Residual tumor (R): R0 - None   12/21/2022 Survivorship   SCP delivered by Santiago Glad, NP      INTERVAL HISTORY:  Kandis Mannan is here  for a follow up of left breast cancer. She was last seen by NP Lacie on 12/21/2022 She presents to the clinic alone. Pt state that she has some joint pain. She take Tylenol for joint pain. She state the pain in her lower back is like a burning sensation, Pt also state that she has some pain at the incision site on he left breast.     All other systems were reviewed with the patient and are negative.  MEDICAL HISTORY:  Past Medical History:  Diagnosis Date   Anal fissure    Anxiety    Breast cancer (HCC) 08/10/21   Date diagnosis was given   Complication of anesthesia 03/16/2022   sore on top of mouth   DVT (deep venous thrombosis) (HCC)    History of radiation therapy    Left breast- 05/05/22- 06/20/22- Dr. Antony Blackbird   Incomplete right bundle branch block (RBBB) determined by electrocardiography 09/01/2021   Personal history of radiation therapy     SURGICAL HISTORY: Past Surgical History:  Procedure Laterality Date   BREAST LUMPECTOMY     BREAST LUMPECTOMY WITH RADIOACTIVE SEED AND SENTINEL LYMPH NODE BIOPSY Left 03/16/2022   Procedure: LEFT BREAST SEED LUMPECTOMY, LEFT SENTINEL LYMPH NODE MAPPING;  Surgeon: Harriette Bouillon, MD;  Location: [facility];  Service: General;  Laterality: Left;  GEN & PEC BLOCK   PORT-A-CATH REMOVAL N/A 07/20/2022   Procedure: REMOVAL PORT-A-CATH;  Surgeon: Harriette Bouillon, MD;  Location: [facility];  Service: General;  Laterality: N/A;   PORTACATH PLACEMENT N/A 09/08/2021   Procedure: INSERTION PORT-A-CATH;  Surgeon: Harriette Bouillon, MD;  Location: [facility];  Service: General;  Laterality: N/A;    I have reviewed the social history and family history with the patient and they are unchanged from previous note.  ALLERGIES:  has No Known Allergies.  MEDICATIONS:  Current Outpatient Medications  Medication Sig Dispense Refill   acetaminophen (TYLENOL) 500 MG tablet Take 500-1,000 mg by mouth every 6 (six) hours as needed  (pain.).     albuterol (VENTOLIN HFA) 108 (90 Base) MCG/ACT inhaler Inhale 2 puffs into the lungs every 4 (four) hours as needed for wheezing. 1 each 5   ALPRAZolam (XANAX) 0.5 MG tablet Take one tablet twice daily as needed for anxiety. 45 tablet 2   cyclobenzaprine (FLEXERIL) 10 MG tablet Take 10 mg by mouth daily as needed for muscle spasms.     desonide (DESOWEN) 0.05 % cream Apply topically 2 (two) times daily. (Patient taking differently: Apply 1 application  topically 2 (two) times daily as needed (skin irritation.).) 30 g 1   desonide (DESOWEN) 0.05 % lotion Apply 1 application. topically daily as needed (acne).     diltiazem 2 % GEL Apply 1 Application topically 3 (three) times daily. Diltiazem 2% ointment -apply three times daily for 8 weeks as instructed  at clinic visit. 30 g 0   EPINEPHrine 0.3 mg/0.3 mL IJ SOAJ injection Inject 0.3 mg into the muscle as needed for anaphylaxis. 1 each 0   hydroquinone 4 % cream APPLY TO DARK SPOTS AT BEDTIME 28.35 g 0   ibuprofen (ADVIL) 800 MG tablet Take 1 tablet (800 mg total) by mouth every 8 (eight) hours as needed. 30 tablet 0   ibuprofen (ADVIL) 800 MG tablet Take 1 tablet (800 mg total) by mouth every 8 (eight) hours as needed. 30 tablet 0   loratadine (CLARITIN) 10 MG tablet Take 10 mg by mouth daily.     Multiple Vitamins-Minerals (ONE A DAY IMMUNITY DEFENSE) CHEW Chew 1 tablet by mouth daily.     traMADol (ULTRAM) 50 MG tablet Take 1 tablet (50 mg total) by mouth every 6 (six) hours as needed. (Patient not taking: Reported on 01/17/2023) 5 tablet 0   No current facility-administered medications for this visit.    PHYSICAL EXAMINATION: ECOG PERFORMANCE STATUS: 0 - Asymptomatic  Vitals:   04/20/23 1101  BP: 114/71  Pulse: 86  Resp: 16  Temp: 98.5 F (36.9 C)  SpO2: 100%   Wt Readings from Last 3 Encounters:  04/20/23 209 lb 9.6 oz (95.1 kg)  01/17/23 201 lb (91.2 kg)  12/21/22 204 lb (92.5 kg)    GENERAL:alert, no distress and  comfortable SKIN: skin color, texture, turgor are normal, no rashes or significant lesions EYES: normal, Conjunctiva are pink and non-injected, sclera clear NECK:(-) supple, thyroid normal size, non-tender, without nodularity LYMPH: (-) no palpable lymphadenopathy in the cervical, axillary  LUNGS: (-) clear to auscultation and percussion with normal breathing effort HEART: (-) regular rate & rhythm and no murmurs and no lower extremity edema BREAST:RT breast no palpable mass breast exam benign.LT breast lumpectomy and dark in color due to radiation. Mild lymphedema. Tender to touch. No palpable mass. Breast exam benign.    LABORATORY DATA:  I have reviewed the data as listed    Latest Ref Rng & Units 04/20/2023   10:36 AM 12/21/2022   12:08 PM 08/20/2022    9:26 AM  CBC  WBC 4.0 - 10.5 K/uL 7.6  7.7  5.6   Hemoglobin 12.0 - 15.0 g/dL 60.4  54.0  98.1   Hematocrit 36.0 - 46.0 % 35.9  34.0  38.7   Platelets 150 - 400 K/uL 294  283  272         Latest Ref Rng & Units 04/20/2023   10:36 AM 12/21/2022   12:08 PM 08/20/2022    9:26 AM  CMP  Glucose 70 - 99 mg/dL 191  478  77   BUN 6 - 20 mg/dL 16  12  13    Creatinine 0.44 - 1.00 mg/dL 2.95  6.21  3.08   Sodium 135 - 145 mmol/L 140  140  139   Potassium 3.5 - 5.1 mmol/L 3.7  3.2  3.6   Chloride 98 - 111 mmol/L 104  103  102   CO2 22 - 32 mmol/L 30  30  29    Calcium 8.9 - 10.3 mg/dL 9.4  9.3  65.7   Total Protein 6.5 - 8.1 g/dL 7.5  7.4  7.7   Total Bilirubin 0.3 - 1.2 mg/dL 0.4  0.6  0.6   Alkaline Phos 38 - 126 U/L 101  97  101   AST 15 - 41 U/L 17  20  19    ALT 0 - 44 U/L 15  20  20       RADIOGRAPHIC STUDIES: I have personally reviewed the radiological images as listed and agreed with the findings in the report. No results found.    Orders Placed This Encounter  Procedures   Ambulatory referral to Physical Therapy    Referral Priority:   Routine    Referral Type:   Physical Medicine    Referral Reason:   Specialty  Services Required    Requested Specialty:   Physical Therapy    Number of Visits Requested:   1   All questions were answered. The patient knows to call the clinic with any problems, questions or concerns. No barriers to learning was detected. The total time spent in the appointment was 25 minutes.     Malachy Mood, MD 04/20/2023   Carolin Coy, CMA, am acting as scribe for Malachy Mood, MD.   I have reviewed the above documentation for accuracy and completeness, and I agree with the above.

## 2023-04-23 ENCOUNTER — Other Ambulatory Visit: Payer: Self-pay

## 2023-04-29 ENCOUNTER — Other Ambulatory Visit: Payer: Self-pay

## 2023-04-29 ENCOUNTER — Encounter: Payer: Self-pay | Admitting: Hematology

## 2023-04-29 DIAGNOSIS — Z170 Estrogen receptor positive status [ER+]: Secondary | ICD-10-CM

## 2023-05-02 ENCOUNTER — Other Ambulatory Visit: Payer: Self-pay

## 2023-05-03 ENCOUNTER — Other Ambulatory Visit: Payer: Self-pay

## 2023-05-05 ENCOUNTER — Encounter: Payer: Self-pay | Admitting: Physician Assistant

## 2023-05-05 ENCOUNTER — Other Ambulatory Visit (INDEPENDENT_AMBULATORY_CARE_PROVIDER_SITE_OTHER): Payer: Federal, State, Local not specified - PPO

## 2023-05-05 ENCOUNTER — Ambulatory Visit: Payer: Federal, State, Local not specified - PPO | Admitting: Physician Assistant

## 2023-05-05 DIAGNOSIS — M25551 Pain in right hip: Secondary | ICD-10-CM

## 2023-05-05 MED ORDER — MELOXICAM 15 MG PO TABS: 15.0000 mg | ORAL_TABLET | Freq: Every day | ORAL | 0 refills | Status: DC

## 2023-05-05 NOTE — Progress Notes (Signed)
Office Visit Note   Patient: Cheyenne [name]           Date of Birth: August 13, 1980           MRN: [id number] Visit Date: 05/05/2023              Requested by: Deeann Saint, MD [address] [place],  [place] [address] PCP: Deeann Saint, MD   Assessment & Plan: Visit Diagnoses:  1. Pain in right hip     Plan: Pleasant 42 year old woman who is recently completed treatment chemotherapy for breast cancer.  Comes in with a 74-month history of right hip pain of insidious onset..  No injuries.  Never had problems before.  The pain is anterior in her joint and she has weakness with lifting her leg.  Denies any paresthesias or radicular symptoms.  She does have some no history of hernia or lifting incident.  This bothers her at night and with simple walking.  No fever or chills.  Given her history and the length of time and that she is having significant pain I will place her on meloxicam.  Also would like for her to get an MRI of her hip arthrogram will follow-up with her once this is completed rates her pain is moderate to severe.  Not solved with anti-inflammatories and Tylenol  Follow-Up Instructions: after MRI  Orders:  Orders Placed This Encounter  Procedures   XR HIP UNILAT W OR W/O PELVIS 2-3 VIEWS RIGHT   No orders of the defined types were placed in this encounter.     Procedures: No procedures performed   Clinical Data: No additional findings.   Subjective: Chief Complaint  Patient presents with   Right Hip - Pain    HPI patient is a 42 year old woman with a history of right hip and pelvic pain radiating to thigh to knee said this is chronic no known injury been taking the Tylenol ibuprofen and using heat pad has difficulty lifting  Review of Systems  All other systems reviewed and are negative.    Objective: Vital Signs: There were no vitals taken for this visit.  Physical Exam Constitutional:      Appearance: Normal appearance.  Skin:     General: Skin is warm and dry.  Neurological:     General: No focal deficit present.     Mental Status: She is alert and oriented to person, place, and time.  Psychiatric:        Mood and Affect: Mood normal.        Behavior: Behavior normal.     Ortho Exam Examination of her right hip she has no lower back pain no radicular findings she is neurovascularly intact she is acutely tender with manipulation of her hip compartments are soft and compressible no masses noted she has pain with resisted flexion of her hip reproduced in her groin.  Has good dorsiflexion plantarflexion of her ankle. Specialty Comments:  No specialty comments available.  Imaging: XR HIP UNILAT W OR W/O PELVIS 2-3 VIEWS RIGHT  Result Date: 05/05/2023 Radiographs of her right hip pelvis were reviewed today no evidence of any arthropathy or fracture    PMFS History: Patient Active Problem List   Diagnosis Date Noted   Pain in right hip 05/05/2023   Port-A-Cath in place 09/16/2021   Genetic testing 08/31/2021   Malignant neoplasm of lower-inner quadrant of left breast in female, estrogen receptor positive (HCC) 08/17/2021   Anxiety 08/06/2019   Plantar  fasciitis 08/06/2019   Fluid collection (edema) in the arms, legs, hands and feet 05/02/2018   Past Medical History:  Diagnosis Date   Anal fissure    Anxiety    Breast cancer (HCC) 08/10/21   Date diagnosis was given   Complication of anesthesia 03/16/2022   sore on top of mouth   DVT (deep venous thrombosis) (HCC)    History of radiation therapy    Left breast- 05/05/22- 06/20/22- Dr. Antony Blackbird   Incomplete right bundle branch block (RBBB) determined by electrocardiography 09/01/2021   Personal history of radiation therapy     Family History  Problem Relation Age of Onset   Hypertension Mother    Edema Mother    Diabetes Mother    Obesity Mother    Diabetes Sister    Obesity Sister    Breast cancer Other 74   Breast cancer Other 77       dx.  with breast cancer again at age 67 (unsure if recurrence or second primary)   Colon cancer Neg Hx    Esophageal cancer Neg Hx    Rectal cancer Neg Hx     Past Surgical History:  Procedure Laterality Date   BREAST LUMPECTOMY     BREAST LUMPECTOMY WITH RADIOACTIVE SEED AND SENTINEL LYMPH NODE BIOPSY Left 03/16/2022   Procedure: LEFT BREAST SEED LUMPECTOMY, LEFT SENTINEL LYMPH NODE MAPPING;  Surgeon: Harriette Bouillon, MD;  Location: [facility];  Service: General;  Laterality: Left;  GEN & PEC BLOCK   PORT-A-CATH REMOVAL N/A 07/20/2022   Procedure: REMOVAL PORT-A-CATH;  Surgeon: Harriette Bouillon, MD;  [facility];  Service: General;  Laterality: N/A;   PORTACATH PLACEMENT N/A 09/08/2021   Procedure: INSERTION PORT-A-CATH;  Surgeon: Harriette Bouillon, MD;  Location: [facility];  Service: General;  Laterality: N/A;   Social History   Occupational History   Occupation: Claims Rep  Tobacco Use   Smoking status: Never   Smokeless tobacco: Never  Vaping Use   Vaping status: Never Used  Substance and Sexual Activity   Alcohol use: Yes    Comment: Occasionally   Drug use: No   Sexual activity: Yes    Partners: Female

## 2023-05-05 NOTE — Therapy (Signed)
OUTPATIENT PHYSICAL THERAPY  UPPER EXTREMITY ONCOLOGY EVALUATION  Patient Name: Cheyenne [name] MRN: [id number] DOB:[birth year], 42 y.o., female Today's Date: 05/06/2023  END OF SESSION:  PT End of Session - 05/06/23 0841     Visit Number 1    Number of Visits 7    Date for PT Re-Evaluation 06/17/23    PT Start Time 0800    PT Stop Time 0841    PT Time Calculation (min) 41 min    Activity Tolerance Patient tolerated treatment well    Behavior During Therapy [facility] for tasks assessed/performed             Past Medical History:  Diagnosis Date   Anal fissure    Anxiety    Breast cancer (HCC) 08/10/21   Date diagnosis was given   Complication of anesthesia 03/16/2022   sore on top of mouth   DVT (deep venous thrombosis) (HCC)    History of radiation therapy    Left breast- 05/05/22- 06/20/22- Dr. Antony Blackbird   Incomplete right bundle branch block (RBBB) determined by electrocardiography 09/01/2021   Personal history of radiation therapy    Past Surgical History:  Procedure Laterality Date   BREAST LUMPECTOMY     BREAST LUMPECTOMY WITH RADIOACTIVE SEED AND SENTINEL LYMPH NODE BIOPSY Left 03/16/2022   Procedure: LEFT BREAST SEED LUMPECTOMY, LEFT SENTINEL LYMPH NODE MAPPING;  Surgeon: Harriette Bouillon, MD;  Location: [facility];  Service: General;  Laterality: Left;  GEN & PEC BLOCK   PORT-A-CATH REMOVAL N/A 07/20/2022   Procedure: REMOVAL PORT-A-CATH;  Surgeon: Harriette Bouillon, MD;  Location: [facility];  Service: General;  Laterality: N/A;   PORTACATH PLACEMENT N/A 09/08/2021   Procedure: INSERTION PORT-A-CATH;  Surgeon: Harriette Bouillon, MD;  Location: [facility];  Service: General;  Laterality: N/A;   Patient Active Problem List   Diagnosis Date Noted   Pain in right hip 05/05/2023   Port-A-Cath in place 09/16/2021   Genetic testing 08/31/2021   Malignant neoplasm of lower-inner quadrant of left breast in female, estrogen receptor positive  (HCC) 08/17/2021   Anxiety 08/06/2019   Plantar fasciitis 08/06/2019   Fluid collection (edema) in the arms, legs, hands and feet 05/02/2018    PCP: Abbe Amsterdam, MD  REFERRING PROVIDER: Malachy Mood, MD  REFERRING DIAG:  Diagnosis  C50.312,Z17.0 (ICD-10-CM) - Malignant neoplasm of lower-inner quadrant of left breast in female, estrogen receptor positive (HCC)    THERAPY DIAG:  Malignant neoplasm of lower-inner quadrant of left breast in female, estrogen receptor positive (HCC)  Aftercare following surgery for neoplasm  Stiffness of left shoulder, not elsewhere classified  Localized edema  Abnormal posture  ONSET DATE: 03/16/22  Rationale for Evaluation and Treatment: Rehabilitation  SUBJECTIVE:  SUBJECTIVE STATEMENT: Sometimes it still hurts (the breast),  I [place] in the airport it was hard to raise my hand overhead.    PERTINENT HISTORY: chemo carbo/taxol/cytoxan, lumpectomy 03/16/22 with 2 negative nodes, completed [place], completed radiation. Has normal [facility] screenings so far.   PAIN:  Are you having pain? Yes - not sitting still  NPRS scale: 5/10 - at the worst  Pain location: Left breast and lateral trunk  Pain orientation: Left  PAIN TYPE: sharp and throbbing Pain description: intermittent  Aggravating factors: reaching back, and up  Relieving factors: tylenol  PRECAUTIONS: Other: Lt lymphedema risk   RED FLAGS: None   WEIGHT BEARING RESTRICTIONS: No  FALLS:  Has patient fallen in last 6 months? 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    OBJECTIVE: Note: Objective measures were completed at Evaluation unless otherwise noted.  COGNITION: Overall cognitive status: Within functional limits for tasks assessed   PALPATION: No ttp except +1 latissimus insertion region, tightness axilla with arm overhead, none to very mild breast fibrosis inferior breast  OBSERVATIONS / OTHER ASSESSMENTS: Lt radiated breast larger than the Rt, enlarged pores near nipple, healed radiation desquamation inferior breast and lateral breast/trunk.   POSTURE: rounded shoulders   UPPER EXTREMITY AROM/PROM:  A/PROM RIGHT   eval   Shoulder extension 45  Shoulder flexion 155  Shoulder abduction 155  Shoulder internal rotation   Shoulder external rotation 105    (Blank rows = not tested)  A/PROM LEFT   eval  Shoulder extension 32 - pull anterior shoulder   Shoulder flexion 140 - pain lateral trunk - pull  Shoulder abduction 155 - lateral pull   Shoulder internal rotation   Shoulder external rotation 85    (Blank rows = not tested)  CERVICAL AROM: All within normal limits  UPPER EXTREMITY STRENGTH:  Grip strength: 45# grip strength bil average of 60# for age  QUICK DASH SURVEY: 6.82%   TODAY'S TREATMENT:                                                                                                                                          DATE: 05/26/23 Eval performed Went through HEP with performance of each with cueing and instruction for all stretches Discussed POC  PATIENT EDUCATION:  Education details: per today's note Person educated: Patient Education method: Explanation, Demonstration, Actor cues, Verbal cues, and Handouts Education comprehension: verbalized understanding and returned demonstration  HOME EXERCISE PROGRAM: Access Code: [email or web address] URL: [email or web address] Date: 05/06/2023 Prepared by: Gwenevere Abbot  Exercises - Supine Chest Stretch with Elbows Bent  - 1 x daily -  7 x  weekly - 1 sets - 1 reps - 1-38min hold - Supine Lower Trunk Rotation  - 1 x daily - 7 x weekly - 1 sets - 5 reps - 6-10 sec hold - Supine Alternating Shoulder Flexion  - 1 x daily - 7 x weekly - 1 sets - 5 reps - 2 sec hold - Mermaid  - 1 x daily - 7 x weekly - 1 sets - 5 reps - 2-3 sec hold - Standing Shoulder Abduction Slides at Wall  - 1 x daily - 7 x weekly - 1 sets - 5 reps - 10-15 sec hold - Standing Shoulder Extension with Dowel  - 1 x daily - 7 x weekly - 1 sets - 5-10 reps - 5-6 sec hold - Standing Shoulder Internal Rotation Stretch with Towel  - 1 x daily - 7 x weekly - 1 sets - 5-10 reps - 5-6 sec hold  ASSESSMENT:  CLINICAL IMPRESSION: Patient is a 42 y.o. female who was seen today for physical therapy evaluation and treatment for her continued Lt upper quadrant pain and stiffness and breast swelling and pain almost 1 year after finishing radiation. She reports she has no real limitations but noticed at the airport she couldn't reach up as high as she needed in the screener. The arm is limited into flexion, ER, and extension with more of a pull and not as much pain.  Her Left breast is swollen more generally and pt reports it fluctuates and she uses compression to manage it.  We will teach her the steps of self MLD to help with swelling here.  She only has every other Friday off of work so she will do appointments as able.    OBJECTIVE IMPAIRMENTS: decreased ROM, increased edema, and increased fascial restrictions.   ACTIVITY LIMITATIONS: none  PARTICIPATION LIMITATIONS: none  PERSONAL FACTORS: Time since onset of injury/illness/exacerbation and 1 comorbidity: radiation and SLNB  are also affecting patient's functional outcome.   REHAB POTENTIAL: Excellent  CLINICAL DECISION MAKING: Stable/uncomplicated  EVALUATION COMPLEXITY: Low  GOALS: Goals reviewed with patient? Yes  SHORT TERM GOALS: Target date: 05/06/23   Pt will be ind with HEP for initial  stretching Baseline: Goal status: MET   LONG TERM GOALS: Target date: 06/17/23  Pt will improve Rt shoulder AROM to at least the opposite side to demonstrate improved reach.  Baseline:  Goal status: INITIAL  2.  Pt will be ind with home stretches Baseline:  Goal status: INITIAL  3.  Pt will be ind with self MLD for the Lt breast  Baseline:  Goal status: INITIAL   PLAN:  PT FREQUENCY: 1x/week  PT DURATION: 6 weeks  PLANNED INTERVENTIONS: Therapeutic exercises, Therapeutic activity, Patient/Family education, Self Care, Manual therapy, and Re-evaluation  PLAN FOR NEXT SESSION: review and perform stretches and add as needed, Lt shoulder PROM, MFR, breast MLD education as able   Idamae Lusher, PT 05/06/2023, 8:44 AM

## 2023-05-06 ENCOUNTER — Ambulatory Visit: Payer: Federal, State, Local not specified - PPO | Attending: Hematology | Admitting: Rehabilitation

## 2023-05-06 ENCOUNTER — Other Ambulatory Visit: Payer: Self-pay

## 2023-05-06 ENCOUNTER — Encounter: Payer: Self-pay | Admitting: Rehabilitation

## 2023-05-06 DIAGNOSIS — M25612 Stiffness of left shoulder, not elsewhere classified: Secondary | ICD-10-CM | POA: Diagnosis not present

## 2023-05-06 DIAGNOSIS — R293 Abnormal posture: Secondary | ICD-10-CM | POA: Insufficient documentation

## 2023-05-06 DIAGNOSIS — C50312 Malignant neoplasm of lower-inner quadrant of left female breast: Secondary | ICD-10-CM | POA: Insufficient documentation

## 2023-05-06 DIAGNOSIS — R600 Localized edema: Secondary | ICD-10-CM | POA: Diagnosis not present

## 2023-05-06 DIAGNOSIS — N644 Mastodynia: Secondary | ICD-10-CM | POA: Insufficient documentation

## 2023-05-06 DIAGNOSIS — Z483 Aftercare following surgery for neoplasm: Secondary | ICD-10-CM | POA: Diagnosis not present

## 2023-05-06 DIAGNOSIS — Z170 Estrogen receptor positive status [ER+]: Secondary | ICD-10-CM | POA: Insufficient documentation

## 2023-05-06 DIAGNOSIS — M25512 Pain in left shoulder: Secondary | ICD-10-CM | POA: Diagnosis not present

## 2023-05-10 ENCOUNTER — Encounter: Payer: Federal, State, Local not specified - PPO | Admitting: Rehabilitation

## 2023-05-18 ENCOUNTER — Encounter: Payer: Self-pay | Admitting: Rehabilitation

## 2023-05-18 ENCOUNTER — Ambulatory Visit: Payer: Federal, State, Local not specified - PPO | Admitting: Rehabilitation

## 2023-05-18 DIAGNOSIS — C50312 Malignant neoplasm of lower-inner quadrant of left female breast: Secondary | ICD-10-CM | POA: Diagnosis not present

## 2023-05-18 DIAGNOSIS — Z483 Aftercare following surgery for neoplasm: Secondary | ICD-10-CM | POA: Diagnosis not present

## 2023-05-18 DIAGNOSIS — R293 Abnormal posture: Secondary | ICD-10-CM | POA: Diagnosis not present

## 2023-05-18 DIAGNOSIS — R600 Localized edema: Secondary | ICD-10-CM | POA: Diagnosis not present

## 2023-05-18 DIAGNOSIS — M25612 Stiffness of left shoulder, not elsewhere classified: Secondary | ICD-10-CM | POA: Diagnosis not present

## 2023-05-18 DIAGNOSIS — N644 Mastodynia: Secondary | ICD-10-CM | POA: Diagnosis not present

## 2023-05-18 DIAGNOSIS — Z170 Estrogen receptor positive status [ER+]: Secondary | ICD-10-CM | POA: Diagnosis not present

## 2023-05-18 DIAGNOSIS — M25512 Pain in left shoulder: Secondary | ICD-10-CM | POA: Diagnosis not present

## 2023-05-18 NOTE — Therapy (Signed)
OUTPATIENT PHYSICAL THERAPY  UPPER EXTREMITY ONCOLOGY TREATMENT  Patient Name: Cheyenne [name] MRN: [id number] DOB:May 13, 1981, 42 y.o., female Today's Date: 05/18/2023  END OF SESSION:  PT End of Session - 05/18/23 1453     Visit Number 2    Number of Visits 7    Date for PT Re-Evaluation 06/17/23    PT Start Time 1500    PT Stop Time 1540    PT Time Calculation (min) 40 min    Activity Tolerance Patient tolerated treatment well    Behavior During Therapy [facility] for tasks assessed/performed             Past Medical History:  Diagnosis Date   Anal fissure    Anxiety    Breast cancer (HCC) 08/10/21   Date diagnosis was given   Complication of anesthesia 03/16/2022   sore on top of mouth   DVT (deep venous thrombosis) (HCC)    History of radiation therapy    Left breast- 05/05/22- 06/20/22- Dr. Antony Blackbird   Incomplete right bundle branch block (RBBB) determined by electrocardiography 09/01/2021   Personal history of radiation therapy    Past Surgical History:  Procedure Laterality Date   BREAST LUMPECTOMY     BREAST LUMPECTOMY WITH RADIOACTIVE SEED AND SENTINEL LYMPH NODE BIOPSY Left 03/16/2022   Procedure: LEFT BREAST SEED LUMPECTOMY, LEFT SENTINEL LYMPH NODE MAPPING;  Surgeon: Harriette Bouillon, MD;  Location: [facility];  Service: General;  Laterality: Left;  GEN & PEC BLOCK   PORT-A-CATH REMOVAL N/A 07/20/2022   Procedure: REMOVAL PORT-A-CATH;  Surgeon: Harriette Bouillon, MD;  Location: [facility];  Service: General;  Laterality: N/A;   PORTACATH PLACEMENT N/A 09/08/2021   Procedure: INSERTION PORT-A-CATH;  Surgeon: Harriette Bouillon, MD;  Location: [facility];  Service: General;  Laterality: N/A;   Patient Active Problem List   Diagnosis Date Noted   Pain in right hip 05/05/2023   Port-A-Cath in place 09/16/2021   Genetic testing 08/31/2021   Malignant neoplasm of lower-inner quadrant of left breast in female, estrogen receptor positive  (HCC) 08/17/2021   Anxiety 08/06/2019   Plantar fasciitis 08/06/2019   Fluid collection (edema) in the arms, legs, hands and feet 05/02/2018    PCP: Abbe Amsterdam, MD  REFERRING PROVIDER: Malachy Mood, MD  REFERRING DIAG:  Diagnosis  C50.312,Z17.0 (ICD-10-CM) - Malignant neoplasm of lower-inner quadrant of left breast in female, estrogen receptor positive (HCC)    THERAPY DIAG:  Malignant neoplasm of lower-inner quadrant of left breast in female, estrogen receptor positive (HCC)  Aftercare following surgery for neoplasm  Stiffness of left shoulder, not elsewhere classified  Localized edema  Abnormal posture  ONSET DATE: 03/16/22  Rationale for Evaluation and Treatment: Rehabilitation  SUBJECTIVE:  SUBJECTIVE STATEMENT:  I was off today so I got in today and not tomorrow.    EVAL: Sometimes it still hurts (the breast),  I noticed in the airport it was hard to raise my hand overhead.    PERTINENT HISTORY: chemo carbo/taxol/cytoxan, lt lumpectomy 03/16/22 with 2 negative nodes, completed [place], completed radiation. Has normal [facility] screenings so far.   PAIN:  Are you having pain? Yes - not sitting still  NPRS scale: 5/10 - at the worst  Pain location: Left breast and lateral trunk  Pain orientation: Left  PAIN TYPE: sharp and throbbing Pain description: intermittent  Aggravating factors: reaching back, and up  Relieving factors: tylenol  PRECAUTIONS: Other: Lt lymphedema risk   RED FLAGS: None   WEIGHT BEARING RESTRICTIONS: No  FALLS:  Has patient fallen in last 6 months? 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    OBJECTIVE: Note: Objective measures were completed at Evaluation unless otherwise noted.  COGNITION: Overall cognitive status: Within functional limits for tasks assessed   PALPATION: No ttp except +1 latissimus insertion region, tightness axilla with arm overhead, none to very mild breast fibrosis inferior breast  OBSERVATIONS / OTHER ASSESSMENTS: Lt radiated breast larger than the Rt, enlarged pores near nipple, healed radiation desquamation inferior breast and lateral breast/trunk.   POSTURE: rounded shoulders   UPPER EXTREMITY AROM/PROM:  A/PROM RIGHT   eval   Shoulder extension 45  Shoulder flexion 155  Shoulder abduction 155  Shoulder internal rotation   Shoulder external rotation 105    (Blank rows = not tested)  A/PROM LEFT   eval  Shoulder extension 32 - pull anterior shoulder   Shoulder flexion 140 - pain lateral trunk - pull  Shoulder abduction 155 - lateral pull   Shoulder internal rotation   Shoulder external rotation 85    (Blank rows = not tested)  CERVICAL AROM: All within normal limits  UPPER EXTREMITY STRENGTH:  Grip strength: 45# grip strength bil average of 60# for age  QUICK DASH SURVEY: 6.82%   TODAY'S TREATMENT:                                                                                                                                          DATE:  05/18/23 Pulleys into flexion and abduction x each  Wall ball flexion 5" x 5 Supine dowel flexion 5" x 5  LTR with chest stretch supine 5" x 3 Alternating supine flexion x 5 bil  Arm propped overhead on a pillow: STM/MFR to the Lt axilla, PROM into flexion and D2 Sidelying STM to latissimus with most tightness here  near insertion.   Abduction AROM  3  Showed tennis ball trigger point release   05/26/23 Eval performed Went through HEP with performance of each with cueing and instruction for  all stretches Discussed POC  PATIENT EDUCATION:  Education details: per today's note Person educated: Patient Education method: Explanation, Demonstration, Tactile cues, Verbal cues, and Handouts Education comprehension: verbalized understanding and returned demonstration  HOME EXERCISE PROGRAM: Access Code: [email or web address] URL: [email or web address] Date: 05/06/2023 Prepared by: Gwenevere Abbot  Exercises - Supine Chest Stretch with Elbows Bent  - 1 x daily - 7 x weekly - 1 sets - 1 reps - 1-51min hold - Supine Lower Trunk Rotation  - 1 x daily - 7 x weekly - 1 sets - 5 reps - 6-10 sec hold - Supine Alternating Shoulder Flexion  - 1 x daily - 7 x weekly - 1 sets - 5 reps - 2 sec hold - Mermaid  - 1 x daily - 7 x weekly - 1 sets - 5 reps - 2-3 sec hold - Standing Shoulder Abduction Slides at Wall  - 1 x daily - 7 x weekly - 1 sets - 5 reps - 10-15 sec hold - Standing Shoulder Extension with Dowel  - 1 x daily - 7 x weekly - 1 sets - 5-10 reps - 5-6 sec hold - Standing Shoulder Internal Rotation Stretch with Towel  - 1 x daily - 7 x weekly - 1 sets - 5-10 reps - 5-6 sec hold  ASSESSMENT:  CLINICAL IMPRESSION: Noted most tightness in latissimus in sidelying with arm propped overhead improved with MT.  Noted breast did not feel swollen lately.   OBJECTIVE IMPAIRMENTS: decreased ROM, increased edema, and increased fascial restrictions.   ACTIVITY LIMITATIONS: none  PARTICIPATION LIMITATIONS: none  PERSONAL FACTORS: Time since onset of injury/illness/exacerbation and 1 comorbidity: radiation and SLNB  are also affecting patient's functional outcome.   REHAB POTENTIAL: Excellent  CLINICAL DECISION MAKING: Stable/uncomplicated  EVALUATION COMPLEXITY: Low  GOALS: Goals reviewed with patient? Yes  SHORT TERM GOALS: Target date: 05/06/23   Pt will be ind with HEP for initial stretching Baseline: Goal status: MET   LONG TERM GOALS: Target date: 06/17/23  Pt will improve Rt  shoulder AROM to at least the opposite side to demonstrate improved reach.  Baseline:  Goal status: INITIAL  2.  Pt will be ind with home stretches Baseline:  Goal status: INITIAL  3.  Pt will be ind with self MLD for the Lt breast  Baseline:  Goal status: INITIAL   PLAN:  PT FREQUENCY: 1x/week  PT DURATION: 6 weeks  PLANNED INTERVENTIONS: Therapeutic exercises, Therapeutic activity, Patient/Family education, Self Care, Manual therapy, and Re-evaluation  PLAN FOR NEXT SESSION: review and perform stretches and add as needed, Lt shoulder PROM, MFR, breast MLD education as able   Idamae Lusher, PT 05/18/2023, 4:08 PM

## 2023-05-19 ENCOUNTER — Ambulatory Visit: Payer: Federal, State, Local not specified - PPO | Admitting: Rehabilitation

## 2023-05-25 LAB — SIGNATERA ONLY (NATERA MANAGED)
SIGNATERA MTM READOUT: 0 MTM/ml
SIGNATERA TEST RESULT: NEGATIVE

## 2023-05-27 ENCOUNTER — Ambulatory Visit: Payer: Federal, State, Local not specified - PPO | Admitting: Rehabilitation

## 2023-05-27 ENCOUNTER — Encounter: Payer: Self-pay | Admitting: Rehabilitation

## 2023-05-27 DIAGNOSIS — R293 Abnormal posture: Secondary | ICD-10-CM | POA: Diagnosis not present

## 2023-05-27 DIAGNOSIS — N644 Mastodynia: Secondary | ICD-10-CM | POA: Diagnosis not present

## 2023-05-27 DIAGNOSIS — Z483 Aftercare following surgery for neoplasm: Secondary | ICD-10-CM | POA: Diagnosis not present

## 2023-05-27 DIAGNOSIS — Z170 Estrogen receptor positive status [ER+]: Secondary | ICD-10-CM

## 2023-05-27 DIAGNOSIS — R600 Localized edema: Secondary | ICD-10-CM

## 2023-05-27 DIAGNOSIS — M25612 Stiffness of left shoulder, not elsewhere classified: Secondary | ICD-10-CM | POA: Diagnosis not present

## 2023-05-27 DIAGNOSIS — C50312 Malignant neoplasm of lower-inner quadrant of left female breast: Secondary | ICD-10-CM | POA: Diagnosis not present

## 2023-05-27 DIAGNOSIS — M25512 Pain in left shoulder: Secondary | ICD-10-CM | POA: Diagnosis not present

## 2023-05-27 NOTE — Therapy (Signed)
OUTPATIENT PHYSICAL THERAPY  UPPER EXTREMITY ONCOLOGY TREATMENT  Patient Name: Cheyenne [name] MRN: [id number] DOB:November 03, 1980, 42 y.o., female Today's Date: 05/27/2023  END OF SESSION:  PT End of Session - 05/27/23 0951     Visit Number 3    Number of Visits 7    Date for PT Re-Evaluation 06/17/23    PT Start Time 0904    PT Stop Time 0951    PT Time Calculation (min) 47 min    Activity Tolerance Patient tolerated treatment well    Behavior During Therapy [facility] for tasks assessed/performed              Past Medical History:  Diagnosis Date   Anal fissure    Anxiety    Breast cancer (HCC) 08/10/21   Date diagnosis was given   Complication of anesthesia 03/16/2022   sore on top of mouth   DVT (deep venous thrombosis) (HCC)    History of radiation therapy    Left breast- 05/05/22- 06/20/22- Dr. Antony Blackbird   Incomplete right bundle branch block (RBBB) determined by electrocardiography 09/01/2021   Personal history of radiation therapy    Past Surgical History:  Procedure Laterality Date   BREAST LUMPECTOMY     BREAST LUMPECTOMY WITH RADIOACTIVE SEED AND SENTINEL LYMPH NODE BIOPSY Left 03/16/2022   Procedure: LEFT BREAST SEED LUMPECTOMY, LEFT SENTINEL LYMPH NODE MAPPING;  Surgeon: Harriette Bouillon, MD;  Location: [facility];  Service: General;  Laterality: Left;  GEN & PEC BLOCK   PORT-A-CATH REMOVAL N/A 07/20/2022   Procedure: REMOVAL PORT-A-CATH;  Surgeon: Harriette Bouillon, MD;  Location: [facility];  Service: General;  Laterality: N/A;   PORTACATH PLACEMENT N/A 09/08/2021   Procedure: INSERTION PORT-A-CATH;  Surgeon: Harriette Bouillon, MD;  Location: [facility];  Service: General;  Laterality: N/A;   Patient Active Problem List   Diagnosis Date Noted   Pain in right hip 05/05/2023   Port-A-Cath in place 09/16/2021   Genetic testing 08/31/2021   Malignant neoplasm of lower-inner quadrant of left breast in female, estrogen receptor positive  (HCC) 08/17/2021   Anxiety 08/06/2019   Plantar fasciitis 08/06/2019   Fluid collection (edema) in the arms, legs, hands and feet 05/02/2018    PCP: Abbe Amsterdam, MD  REFERRING PROVIDER: Malachy Mood, MD  REFERRING DIAG:  Diagnosis  C50.312,Z17.0 (ICD-10-CM) - Malignant neoplasm of lower-inner quadrant of left breast in female, estrogen receptor positive (HCC)    THERAPY DIAG:  Malignant neoplasm of lower-inner quadrant of left breast in female, estrogen receptor positive (HCC)  Aftercare following surgery for neoplasm  Stiffness of left shoulder, not elsewhere classified  Localized edema  Abnormal posture  ONSET DATE: 03/16/22  Rationale for Evaluation and Treatment: Rehabilitation  SUBJECTIVE:  SUBJECTIVE STATEMENT:  No specific complaints.    EVAL: Sometimes it still hurts (the breast),  [place] in the airport it was hard to raise my hand overhead.    PERTINENT HISTORY: chemo carbo/taxol/cytoxan, lt lumpectomy 03/16/22 with 2 negative nodes, completed [place], completed radiation. Has normal SOZO screenings so far.   PAIN:  Are you having pain? Yes - not sitting still  NPRS scale: 5/10 - at the worst  Pain location: Left breast and lateral trunk  Pain orientation: Left  PAIN TYPE: sharp and throbbing Pain description: intermittent  Aggravating factors: reaching back, and up  Relieving factors: tylenol  PRECAUTIONS: Other: Lt lymphedema risk   RED FLAGS: None   WEIGHT BEARING RESTRICTIONS: No  FALLS:  Has patient fallen in last 6 months? 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    OBJECTIVE: Note: Objective measures were completed at Evaluation unless otherwise noted.  COGNITION: Overall cognitive status: Within functional limits for tasks assessed   PALPATION: No ttp except +1 latissimus insertion region, tightness axilla with arm overhead, none to very mild breast fibrosis inferior breast  OBSERVATIONS / OTHER ASSESSMENTS: Lt radiated breast larger than the Rt, enlarged pores near nipple, healed radiation desquamation inferior breast and lateral breast/trunk.   POSTURE: rounded shoulders   UPPER EXTREMITY AROM/PROM:  A/PROM RIGHT   eval   Shoulder extension 45  Shoulder flexion 155  Shoulder abduction 155  Shoulder internal rotation   Shoulder external rotation 105    (Blank rows = not tested)  A/PROM LEFT   eval  Shoulder extension 32 - pull anterior shoulder   Shoulder flexion 140 - pain lateral trunk - pull  Shoulder abduction 155 - lateral pull   Shoulder internal rotation   Shoulder external rotation 85    (Blank rows = not tested)  CERVICAL AROM: All within normal limits  UPPER EXTREMITY STRENGTH:  Grip strength: 45# grip strength bil average of 60# for age  QUICK DASH SURVEY: 6.82%   TODAY'S TREATMENT:                                                                                                                                          DATE:  05/27/23 Pulleys into flexion and abduction x each  Wall ball flexion 5" x 5 Supine dowel flexion 5" x 5  Snow angels x 5 with initial instruction  LTR with chest stretch supine 5" x 3 Alternating supine flexion x 5 bil (easy)  Supine scap series yellow x 5 with initial instruction  Arm propped overhead on a pillow: STM/MFR to the Lt axilla, PROM into flexion  and D2 Sidelying STM to latissimus with most tightness here near insertion.   Abduction AROM  3   05/18/23 Pulleys into flexion and abduction x  each  Wall ball flexion 5" x 5 Supine dowel flexion 5" x 5  LTR with chest stretch supine 5" x 3 Alternating supine flexion x 5 bil  Arm propped overhead on a pillow: STM/MFR to the Lt axilla, PROM into flexion and D2 Sidelying STM to latissimus with most tightness here near insertion.   Abduction AROM  3  Showed tennis ball trigger point release   05/26/23 Eval performed Went through HEP with performance of each with cueing and instruction for all stretches Discussed POC  PATIENT EDUCATION:  Education details: per today's note Person educated: Patient Education method: Explanation, Demonstration, Tactile cues, Verbal cues, and Handouts Education comprehension: verbalized understanding and returned demonstration  HOME EXERCISE PROGRAM: Access Code: [email or web address] URL: [email or web address] Date: 05/06/2023 Prepared by: Gwenevere Abbot  Exercises - Supine Chest Stretch with Elbows Bent  - 1 x daily - 7 x weekly - 1 sets - 1 reps - 1-23min hold - Supine Lower Trunk Rotation  - 1 x daily - 7 x weekly - 1 sets - 5 reps - 6-10 sec hold - Supine Alternating Shoulder Flexion  - 1 x daily - 7 x weekly - 1 sets - 5 reps - 2 sec hold - Mermaid  - 1 x daily - 7 x weekly - 1 sets - 5 reps - 2-3 sec hold - Standing Shoulder Abduction Slides at Wall  - 1 x daily - 7 x weekly - 1 sets - 5 reps - 10-15 sec hold - Standing Shoulder Extension with Dowel  - 1 x daily - 7 x weekly - 1 sets - 5-10 reps - 5-6 sec hold - Standing Shoulder Internal Rotation Stretch with Towel  - 1 x daily - 7 x weekly - 1 sets - 5-10 reps - 5-6 sec hold  ASSESSMENT:  CLINICAL IMPRESSION: Noted most tightness in latissimus in sidelying with arm propped overhead but less than last time and still improved with MT.  Noted breast did not feel swollen lately.   OBJECTIVE IMPAIRMENTS: decreased ROM, increased edema, and increased fascial restrictions.   ACTIVITY LIMITATIONS: none  PARTICIPATION LIMITATIONS:  none  PERSONAL FACTORS: Time since onset of injury/illness/exacerbation and 1 comorbidity: radiation and SLNB  are also affecting patient's functional outcome.   REHAB POTENTIAL: Excellent  CLINICAL DECISION MAKING: Stable/uncomplicated  EVALUATION COMPLEXITY: Low  GOALS: Goals reviewed with patient? Yes  SHORT TERM GOALS: Target date: 05/06/23   Pt will be ind with HEP for initial stretching Baseline: Goal status: MET   LONG TERM GOALS: Target date: 06/17/23  Pt will improve Rt shoulder AROM to at least the opposite side to demonstrate improved reach.  Baseline:  Goal status: INITIAL  2.  Pt will be ind with home stretches Baseline:  Goal status: INITIAL  3.  Pt will be ind with self MLD for the Lt breast  Baseline:  Goal status: INITIAL   PLAN:  PT FREQUENCY: 1x/week  PT DURATION: 6 weeks  PLANNED INTERVENTIONS: Therapeutic exercises, Therapeutic activity, Patient/Family education, Self Care, Manual therapy, and Re-evaluation  PLAN FOR NEXT SESSION: review and perform stretches and add as needed, Lt shoulder PROM, MFR, breast MLD education as able   Idamae Lusher, PT 05/27/2023, 9:51 AM

## 2023-06-01 ENCOUNTER — Other Ambulatory Visit: Payer: Self-pay | Admitting: Physician Assistant

## 2023-06-05 ENCOUNTER — Other Ambulatory Visit: Payer: Self-pay

## 2023-06-10 ENCOUNTER — Ambulatory Visit
Admission: RE | Admit: 2023-06-10 | Discharge: 2023-06-10 | Disposition: A | Discharge status: Home / Self Care | Payer: Federal, State, Local not specified - PPO | Source: Ambulatory Visit | Attending: Physician Assistant

## 2023-06-10 ENCOUNTER — Ambulatory Visit: Payer: Federal, State, Local not specified - PPO | Attending: Hematology | Admitting: Rehabilitation

## 2023-06-10 ENCOUNTER — Ambulatory Visit: Payer: Federal, State, Local not specified - PPO | Admitting: Rehabilitation

## 2023-06-10 ENCOUNTER — Ambulatory Visit
Admission: RE | Admit: 2023-06-10 | Discharge: 2023-06-10 | Disposition: A | Discharge status: Home / Self Care | Payer: Federal, State, Local not specified - PPO | Source: Ambulatory Visit | Attending: Physician Assistant | Admitting: Physician Assistant

## 2023-06-10 DIAGNOSIS — Z170 Estrogen receptor positive status [ER+]: Secondary | ICD-10-CM | POA: Diagnosis not present

## 2023-06-10 DIAGNOSIS — C50312 Malignant neoplasm of lower-inner quadrant of left female breast: Secondary | ICD-10-CM | POA: Insufficient documentation

## 2023-06-10 DIAGNOSIS — M25551 Pain in right hip: Secondary | ICD-10-CM | POA: Diagnosis not present

## 2023-06-10 DIAGNOSIS — R293 Abnormal posture: Secondary | ICD-10-CM | POA: Insufficient documentation

## 2023-06-10 DIAGNOSIS — M25612 Stiffness of left shoulder, not elsewhere classified: Secondary | ICD-10-CM | POA: Insufficient documentation

## 2023-06-10 DIAGNOSIS — R600 Localized edema: Secondary | ICD-10-CM | POA: Diagnosis not present

## 2023-06-10 DIAGNOSIS — M76891 Other specified enthesopathies of right lower limb, excluding foot: Secondary | ICD-10-CM | POA: Diagnosis not present

## 2023-06-10 DIAGNOSIS — Z483 Aftercare following surgery for neoplasm: Secondary | ICD-10-CM | POA: Insufficient documentation

## 2023-06-10 DIAGNOSIS — M24851 Other specific joint derangements of right hip, not elsewhere classified: Secondary | ICD-10-CM | POA: Diagnosis not present

## 2023-06-10 DIAGNOSIS — M1611 Unilateral primary osteoarthritis, right hip: Secondary | ICD-10-CM | POA: Diagnosis not present

## 2023-06-10 DIAGNOSIS — M7601 Gluteal tendinitis, right hip: Secondary | ICD-10-CM | POA: Diagnosis not present

## 2023-06-10 MED ORDER — IOPAMIDOL (ISOVUE-M 200) INJECTION 41%
10.0000 mL | Freq: Once | INTRAMUSCULAR | Status: AC
  Administered 2023-06-10: 10 mL via INTRA_ARTICULAR

## 2023-06-10 NOTE — Therapy (Signed)
OUTPATIENT PHYSICAL THERAPY  UPPER EXTREMITY ONCOLOGY TREATMENT  Patient Name: Cheyenne [name] MRN: [id number] DOB:[birth year], 42 y.o., female Today's Date: 06/10/2023  END OF SESSION:  PT End of Session - 06/10/23 0950     Visit Number 4    Number of Visits 4    Date for PT Re-Evaluation 06/17/23    PT Start Time 0856    PT Stop Time 0946    PT Time Calculation (min) 50 min    Activity Tolerance Patient tolerated treatment well    Behavior During Therapy [facility] for tasks assessed/performed               Past Medical History:  Diagnosis Date   Anal fissure    Anxiety    Breast cancer (HCC) 08/10/21   Date diagnosis was given   Complication of anesthesia 03/16/2022   sore on top of mouth   DVT (deep venous thrombosis) (HCC)    History of radiation therapy    Left breast- 05/05/22- 06/20/22- Dr. Antony Blackbird   Incomplete right bundle branch block (RBBB) determined by electrocardiography 09/01/2021   Personal history of radiation therapy    Past Surgical History:  Procedure Laterality Date   BREAST LUMPECTOMY     BREAST LUMPECTOMY WITH RADIOACTIVE SEED AND SENTINEL LYMPH NODE BIOPSY Left 03/16/2022   Procedure: LEFT BREAST SEED LUMPECTOMY, LEFT SENTINEL LYMPH NODE MAPPING;  Surgeon: Harriette Bouillon, MD;  Location: [facility];  Service: General;  Laterality: Left;  GEN & PEC BLOCK   PORT-A-CATH REMOVAL N/A 07/20/2022   Procedure: REMOVAL PORT-A-CATH;  Surgeon: Harriette Bouillon, MD;  Location: [facility];  Service: General;  Laterality: N/A;   PORTACATH PLACEMENT N/A 09/08/2021   Procedure: INSERTION PORT-A-CATH;  Surgeon: Harriette Bouillon, MD;  Location: [facility];  Service: General;  Laterality: N/A;   Patient Active Problem List   Diagnosis Date Noted   Pain in right hip 05/05/2023   Port-A-Cath in place 09/16/2021   Genetic testing 08/31/2021   Malignant neoplasm of lower-inner quadrant of left breast in female, estrogen receptor positive  (HCC) 08/17/2021   Anxiety 08/06/2019   Plantar fasciitis 08/06/2019   Fluid collection (edema) in the arms, legs, hands and feet 05/02/2018    PCP: Abbe Amsterdam, MD  REFERRING PROVIDER: Malachy Mood, MD  REFERRING DIAG:  Diagnosis  C50.312,Z17.0 (ICD-10-CM) - Malignant neoplasm of lower-inner quadrant of left breast in female, estrogen receptor positive (HCC)    THERAPY DIAG:  Malignant neoplasm of lower-inner quadrant of left breast in female, estrogen receptor positive (HCC)  Aftercare following surgery for neoplasm  Stiffness of left shoulder, not elsewhere classified  Localized edema  Abnormal posture  ONSET DATE: 03/16/22  Rationale for Evaluation and Treatment: Rehabilitation  SUBJECTIVE:  SUBJECTIVE STATEMENT:  I feel like I can be done.    EVAL: Sometimes it still hurts (the breast),  [place] in the airport it was hard to raise my hand overhead.    PERTINENT HISTORY: chemo carbo/taxol/cytoxan, lt lumpectomy 03/16/22 with 2 negative nodes, completed [place], completed radiation. Has normal SOZO screenings so far.   PAIN:  Are you having pain? No  PRECAUTIONS: Other: Lt lymphedema risk   RED FLAGS: None   WEIGHT BEARING RESTRICTIONS: No  FALLS:  Has patient fallen in last 6 months? 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    OBJECTIVE: Note: Objective measures were completed at Evaluation unless otherwise noted.  COGNITION: Overall cognitive status: Within functional limits for tasks assessed   PALPATION: No ttp  except +1 latissimus insertion region, tightness axilla with arm overhead, none to very mild breast fibrosis inferior breast  OBSERVATIONS / OTHER ASSESSMENTS: Lt radiated breast larger than the Rt, enlarged pores near nipple, healed radiation desquamation inferior breast and lateral breast/trunk.   POSTURE: rounded shoulders   UPPER EXTREMITY AROM/PROM:  A/PROM RIGHT   eval   Shoulder extension 45  Shoulder flexion 155  Shoulder abduction 155  Shoulder internal rotation   Shoulder external rotation 105    (Blank rows = not tested)  A/PROM LEFT   eval 06/10/23  Shoulder extension 32 - pull anterior shoulder  60  Shoulder flexion 140 - pain lateral trunk - pull 140 - pull  Shoulder abduction 155 - lateral pull  155 - no pull  Shoulder internal rotation    Shoulder external rotation 85 93    (Blank rows = not tested)  CERVICAL AROM: All within normal limits  UPPER EXTREMITY STRENGTH:  Grip strength: 45# grip strength bil average of 60# for age  QUICK DASH SURVEY: 6.82%   TODAY'S TREATMENT:                                                                                                                                          DATE:  06/10/23 Performed SOZO Reviewed goals and checked ROM x Pulleys into flexion and abduction x each  Wall ball flexion 5" x 5 Snow angels x 5 with initial instruction  Alternating supine flexion x 5 bil (easy)  Supine scap series red x 5 - advancing from yellow and given for HEP Arm propped overhead on a pillow: STM/MFR to the Lt axilla, PROM into flexion and D2 Sidelying STM to latissimus  - over much less tightness   05/27/23 Pulleys into flexion and abduction x each  Wall ball flexion 5" x 5 Supine  dowel flexion 5" x 5  Snow angels x 5 with initial instruction  LTR with chest stretch supine 5" x 3 Alternating supine flexion x 5 bil (easy)  Supine scap series yellow x 5 with initial instruction  Arm propped overhead on  a pillow: STM/MFR to the Lt axilla, PROM into flexion and D2 Sidelying STM to latissimus with most tightness here near insertion.   Abduction AROM  3   05/18/23 Pulleys into flexion and abduction x each  Wall ball flexion 5" x 5 Supine dowel flexion 5" x 5  LTR with chest stretch supine 5" x 3 Alternating supine flexion x 5 bil  Arm propped overhead on a pillow: STM/MFR to the Lt axilla, PROM into flexion and D2 Sidelying STM to latissimus with most tightness here near insertion.   Abduction AROM  3  Showed tennis ball trigger point release   05/26/23 Eval performed Went through HEP with performance of each with cueing and instruction for all stretches Discussed POC  PATIENT EDUCATION:  Education details: per today's note Person educated: Patient Education method: Explanation, Demonstration, Tactile cues, Verbal cues, and Handouts Education comprehension: verbalized understanding and returned demonstration  HOME EXERCISE PROGRAM: Access Code: [email or web address] URL: [email or web address] Date: 05/06/2023 Prepared by: Gwenevere Abbot  Exercises - Supine Chest Stretch with Elbows Bent  - 1 x daily - 7 x weekly - 1 sets - 1 reps - 1-91min hold - Supine Lower Trunk Rotation  - 1 x daily - 7 x weekly - 1 sets - 5 reps - 6-10 sec hold - Supine Alternating Shoulder Flexion  - 1 x daily - 7 x weekly - 1 sets - 5 reps - 2 sec hold - Mermaid  - 1 x daily - 7 x weekly - 1 sets - 5 reps - 2-3 sec hold - Standing Shoulder Abduction Slides at Wall  - 1 x daily - 7 x weekly - 1 sets - 5 reps - 10-15 sec hold - Standing Shoulder Extension with Dowel  - 1 x daily - 7 x weekly - 1 sets - 5-10 reps - 5-6 sec hold - Standing Shoulder Internal Rotation Stretch with Towel  - 1 x daily - 7 x weekly - 1 sets - 5-10 reps - 5-6 sec hold  ASSESSMENT:  CLINICAL IMPRESSION: Pt has met all goals and is ready for DC.  She had minimal tightness today but does still have chronic scar tissue and  radiation fibrosis.  Her sozo was normal and still in the green and scheduled out x 3 months.  She will continue will HEP and SOZO screens .   OBJECTIVE IMPAIRMENTS: decreased ROM, increased edema, and increased fascial restrictions.   ACTIVITY LIMITATIONS: none  PARTICIPATION LIMITATIONS: none  PERSONAL FACTORS: Time since onset of injury/illness/exacerbation and 1 comorbidity: radiation and SLNB  are also affecting patient's functional outcome.   REHAB POTENTIAL: Excellent  CLINICAL DECISION MAKING: Stable/uncomplicated  EVALUATION COMPLEXITY: Low  GOALS: Goals reviewed with patient? Yes  SHORT TERM GOALS: Target date: 05/06/23   Pt will be ind with HEP for initial stretching Baseline: Goal status: MET   LONG TERM GOALS: Target date: 06/17/23  Pt will improve Rt shoulder AROM to at least the opposite side to demonstrate improved reach.  Baseline:  Goal status: MET  2.  Pt will be ind with home stretches Baseline:  Goal status: MET  3.  Pt will be ind with self MLD for the Lt breast  Baseline:  Goal status: DEFERRED  PLAN:  PT FREQUENCY: 1x/week  PT DURATION: 6 weeks  PLANNED INTERVENTIONS: Therapeutic exercises, Therapeutic activity, Patient/Family education, Self Care, Manual therapy, and Re-evaluation  PLAN FOR NEXT SESSION: review and perform stretches and add as needed, Lt shoulder PROM, MFR, breast MLD education as able   Idamae Lusher, PT 06/10/2023, 9:51 AM  PHYSICAL THERAPY DISCHARGE SUMMARY  Visits from Start of Care: 4  Current functional level related to goals / functional outcomes: seeabove   Remaining deficits: Lymphedema risk   Education / Equipment: Final HEP  Plan: Patient agrees to discharge.  Patient is being discharged due to meeting the stated rehab goals.

## 2023-06-12 ENCOUNTER — Other Ambulatory Visit: Payer: Self-pay

## 2023-06-14 ENCOUNTER — Encounter: Payer: Federal, State, Local not specified - PPO | Admitting: Rehabilitation

## 2023-06-24 ENCOUNTER — Encounter: Payer: Self-pay | Admitting: Sports Medicine

## 2023-06-24 ENCOUNTER — Ambulatory Visit (INDEPENDENT_AMBULATORY_CARE_PROVIDER_SITE_OTHER): Payer: Federal, State, Local not specified - PPO | Admitting: Sports Medicine

## 2023-06-24 ENCOUNTER — Other Ambulatory Visit: Payer: Self-pay

## 2023-06-24 DIAGNOSIS — M1611 Unilateral primary osteoarthritis, right hip: Secondary | ICD-10-CM | POA: Diagnosis not present

## 2023-06-24 DIAGNOSIS — M25551 Pain in right hip: Secondary | ICD-10-CM | POA: Diagnosis not present

## 2023-06-24 MED ORDER — METHYLPREDNISOLONE ACETATE 40 MG/ML IJ SUSP
80.0000 mg | INTRAMUSCULAR | Status: AC | PRN
  Administered 2023-06-24: 80 mg via INTRA_ARTICULAR

## 2023-06-24 MED ORDER — LIDOCAINE HCL 1 % IJ SOLN
4.0000 mL | INTRAMUSCULAR | Status: AC | PRN
Start: 2023-06-24 — End: 2023-06-24
  Administered 2023-06-24: 4 mL

## 2023-06-24 NOTE — Progress Notes (Signed)
Cheyenne [name] - 42 y.o. female MRN [id number]  Date of birth: [birth year]  Office Visit Note: Visit Date: 06/24/2023 PCP: Deeann Saint, MD Referred by: Deeann Saint, MD  Subjective: Chief Complaint  Patient presents with   Right Hip - Pain    Injection   HPI: Cheyenne [name] is a pleasant 42 y.o. female who presents today for evaluation and treatment of right hip pain.  Previously saw one of my partners, Cheyenne [name] for the right hip after a few month history of insidious onset of right hip pain.  She did have an MRI arthrogram of the right hip which showed mild to moderate hip osteoarthritis and some degenerative labral tearing.  She is having pain over the anterior hip, this will also radiate into the groin.  She is taking meloxicam 15 mg daily which does help most days.  She has not been any formalized physical therapy.  Pertinent ROS were reviewed with the patient and found to be negative unless otherwise specified above in HPI.   Assessment & Plan: Visit Diagnoses:  1. Unilateral primary osteoarthritis, right hip   2. Pain in right hip    Plan: Discussed with Cheyenne [name] the nature of her acute on chronic right hip pain which is a flare of her osteoarthritis.  We did review her hip MRI which shows rather high-grade cartilage loss within the hip joint as well as subchondral cystic change about the hip.  Through shared decision making, we did proceed with ultrasound-guided intra-articular injection.  She actually had quite significant relief just from the anesthetic portion minutes following the injection.  Discussed modified rest and activity over the next 48 hours, she may continue her meloxicam 15 mg daily.  She may use ice/heat or Tylenol for any postinjection pain.  She will follow-up with [name] in a few weeks if she is not having lasting relief.  Could consider physical therapy.  Follow-up: Return in about 4 weeks (around 07/22/2023), or if symptoms  worsen or fail to improve, for with Cheyenne [name] if still having pain.   Meds & Orders: No orders of the defined types were placed in this encounter.   Orders Placed This Encounter  Procedures   Large Joint Inj   US Guided Needle Placement - No Linked Charges     Procedures: Large Joint Inj: R hip joint on 06/24/2023 1:54 PM Indications: pain Details: 22 G 3.5 in needle, ultrasound-guided anterior approach Medications: 4 mL lidocaine 1 %; 80 mg methylPREDNISolone acetate 40 MG/ML Outcome: tolerated well, no immediate complications  Procedure: US-guided intra-articular hip injection, Right After discussion on risks/benefits/indications and informed verbal consent was obtained, a timeout was performed. Patient was lying supine on exam table. The hip was cleaned with betadine and alcohol swabs. Then utilizing ultrasound guidance, the patient's femoral head and neck junction was identified and subsequently injected with 4:2 lidocaine:depomedrol via an in-plane approach with ultrasound visualization of the injectate administered into the hip joint. Patient tolerated procedure well without immediate complications.  Procedure, treatment alternatives, risks and benefits explained, specific risks discussed. Consent was given by the patient. Immediately prior to procedure a time out was called to verify the correct patient, procedure, equipment, support staff and site/side marked as required. Patient was prepped and draped in the usual sterile fashion.          Clinical History: No specialty comments available.  She reports that she has never smoked. She has never used smokeless tobacco. No results  for input(s): "HGBA1C", "LABURIC" in the last 8760 hours.  Objective:    Physical Exam  Gen: Well-appearing, in no acute distress; non-toxic CV: Well-perfused. Warm.  Resp: Breathing unlabored on room air; no wheezing. Psych: Fluid speech in conversation; appropriate affect; normal thought  process Neuro: Sensation intact throughout. No gross coordination deficits.   Ortho Exam - Right hip: There is some pain at endrange internal and external logroll, positive FADIR and Stinchfield testing.  No redness swelling or effusion.  No GTB tenderness.  Imaging:  MR Hip Right w/ contrast CLINICAL DATA:  Right hip pain radiating into the right groin and thigh for the past 6 months. No injury or prior surgery.  EXAM: MRI OF THE RIGHT HIP WITH CONTRAST (MR ARTHROGRAM)  TECHNIQUE: Multiplanar, multisequence MR imaging was performed following the administration of intra-articular contrast.  CONTRAST:  See injection documentation.  COMPARISON:  Right hip x-rays dated May 05, 2023.  FINDINGS: Bones: There is no evidence of acute fracture, dislocation or avascular necrosis. No focal bone lesion. The visualized sacroiliac joints and symphysis pubis appear normal.  Articular cartilage and labrum  Articular cartilage: High-grade partial and full-thickness cartilage loss in the anterior superior hip joints bilaterally, with prominent subchondral cystic change in both anterior acetabula.  Labrum: Bilateral anterior superior labral tears with small paralabral cysts.  Joint or bursal effusion  Joint effusion: The right hip joint is distended with intra-articular contrast. No left hip joint effusion.  Bursae: No focal periarticular fluid collection.  Muscles and tendons  Muscles and tendons: Mild bilateral gluteal peritendinitis. Mild left hamstring origin tendinosis. Normal iliopsoas tendons. No muscle edema or atrophy.  Other findings  Miscellaneous: The visualized internal pelvic contents appear unremarkable.  IMPRESSION: 1. Mild to moderate bilateral hip osteoarthritis. 2. Bilateral anterior superior labral tears with small paralabral cysts. 3. Mild bilateral gluteal peritendinitis. 4. Mild left hamstring origin tendinosis.  Electronically Signed   By:  Obie Dredge M.D.   On: 06/21/2023 14:54   Past Medical/Family/Surgical/Social History: Medications & Allergies reviewed per EMR, new medications updated. Patient Active Problem List   Diagnosis Date Noted   Pain in right hip 05/05/2023   Port-A-Cath in place 09/16/2021   Genetic testing 08/31/2021   Malignant neoplasm of lower-inner quadrant of left breast in female, estrogen receptor positive (HCC) 08/17/2021   Anxiety 08/06/2019   Plantar fasciitis 08/06/2019   Fluid collection (edema) in the arms, legs, hands and feet 05/02/2018   Past Medical History:  Diagnosis Date   Anal fissure    Anxiety    Breast cancer (HCC) 08/10/21   Date diagnosis was given   Complication of anesthesia 03/16/2022   sore on top of mouth   DVT (deep venous thrombosis) (HCC)    History of radiation therapy    Left breast- 05/05/22- 06/20/22- Dr. Antony Blackbird   Incomplete right bundle branch block (RBBB) determined by electrocardiography 09/01/2021   Personal history of radiation therapy    Family History  Problem Relation Age of Onset   Hypertension Mother    Edema Mother    Diabetes Mother    Obesity Mother    Diabetes Sister    Obesity Sister    Breast cancer Other 91   Breast cancer Other 72       dx. with breast cancer again at age 12 (unsure if recurrence or second primary)   Colon cancer Neg Hx    Esophageal cancer Neg Hx    Rectal cancer Neg Hx  Past Surgical History:  Procedure Laterality Date   BREAST LUMPECTOMY     BREAST LUMPECTOMY WITH RADIOACTIVE SEED AND SENTINEL LYMPH NODE BIOPSY Left 03/16/2022   Procedure: LEFT BREAST SEED LUMPECTOMY, LEFT SENTINEL LYMPH NODE MAPPING;  Surgeon: Harriette Bouillon, MD;  Location: [facility];  Service: General;  Laterality: Left;  GEN & PEC BLOCK   PORT-A-CATH REMOVAL N/A 07/20/2022   Procedure: REMOVAL PORT-A-CATH;  Surgeon: Harriette Bouillon, MD;  Location:  SUR[facility]ERY [facility];  Service: General;  Laterality: N/A;    PORTACATH PLACEMENT N/A 09/08/2021   Procedure: INSERTION PORT-A-CATH;  Surgeon: Harriette Bouillon, MD;  Location: [facility];  Service: General;  Laterality: N/A;   Social History   Occupational History   Occupation: Claims Rep  Tobacco Use   Smoking status: Never   Smokeless tobacco: Never  Vaping Use   Vaping status: Never Used  Substance and Sexual Activity   Alcohol use: Yes    Comment: Occasionally   Drug use: No   Sexual activity: Yes    Partners: Female

## 2023-06-26 ENCOUNTER — Encounter: Payer: Self-pay | Admitting: Sports Medicine

## 2023-06-27 ENCOUNTER — Encounter: Payer: Self-pay | Admitting: Sports Medicine

## 2023-06-27 ENCOUNTER — Other Ambulatory Visit: Payer: Self-pay | Admitting: Sports Medicine

## 2023-06-30 ENCOUNTER — Other Ambulatory Visit: Payer: Self-pay | Admitting: Physician Assistant

## 2023-07-06 ENCOUNTER — Telehealth: Payer: Self-pay | Admitting: Hematology

## 2023-07-11 ENCOUNTER — Other Ambulatory Visit: Payer: Self-pay | Admitting: Physician Assistant

## 2023-07-11 MED ORDER — MELOXICAM 15 MG PO TABS: 15.0000 mg | ORAL_TABLET | Freq: Every day | ORAL | 0 refills | Status: DC

## 2023-08-04 ENCOUNTER — Other Ambulatory Visit: Payer: Self-pay | Admitting: Hematology

## 2023-08-04 DIAGNOSIS — Z853 Personal history of malignant neoplasm of breast: Secondary | ICD-10-CM

## 2023-08-05 ENCOUNTER — Other Ambulatory Visit: Payer: Self-pay

## 2023-08-07 ENCOUNTER — Other Ambulatory Visit: Payer: Self-pay | Admitting: Physician Assistant

## 2023-08-10 ENCOUNTER — Other Ambulatory Visit: Payer: Self-pay | Admitting: Sports Medicine

## 2023-08-10 MED ORDER — MELOXICAM 15 MG PO TABS: 15.0000 mg | ORAL_TABLET | Freq: Every day | ORAL | 0 refills | Status: DC

## 2023-08-17 ENCOUNTER — Telehealth: Payer: Self-pay

## 2023-08-17 LAB — SIGNATERA
SIGNATERA MTM READOUT: 0 MTM/ml
SIGNATERA TEST RESULT: NEGATIVE

## 2023-08-17 NOTE — Telephone Encounter (Addendum)
 Called patient and relayed message below as per Dr. Maryalice Smaller, patient voiced full understanding with no further questions at this time.   ----- Message from Sonja Del Norte sent at 08/17/2023 11:44 AM EST ----- PLEASE let pt know her negative result, good news.  Sonja Salineno

## 2023-08-23 ENCOUNTER — Other Ambulatory Visit: Payer: Federal, State, Local not specified - PPO

## 2023-08-23 ENCOUNTER — Ambulatory Visit: Payer: Federal, State, Local not specified - PPO | Admitting: Hematology

## 2023-08-23 ENCOUNTER — Inpatient Hospital Stay: Payer: Federal, State, Local not specified - PPO

## 2023-09-08 NOTE — Therapy (Signed)
  OUTPATIENT PHYSICAL THERAPY SOZO SCREENING NOTE   Patient Name: Cheyenne [name] MRN: [id number] DOB:06-21-1981, 43 y.o., female Today's Date: 09/08/2023  PCP: Mercer Clotilda SAUNDERS, MD REFERRING PROVIDER: Lanny Callander, MD    Past Medical History:  Diagnosis Date   Anal fissure    Anxiety    Breast cancer (HCC) 08/10/21   Date diagnosis was given   Complication of anesthesia 03/16/2022   sore on top of mouth   DVT (deep venous thrombosis) (HCC)    History of radiation therapy    Left breast- 05/05/22- 06/20/22- Dr. Lynwood Nasuti   Incomplete right bundle branch block (RBBB) determined by electrocardiography 09/01/2021   Personal history of radiation therapy    Past Surgical History:  Procedure Laterality Date   BREAST LUMPECTOMY     BREAST LUMPECTOMY WITH RADIOACTIVE SEED AND SENTINEL LYMPH NODE BIOPSY Left 03/16/2022   Procedure: LEFT BREAST SEED LUMPECTOMY, LEFT SENTINEL LYMPH NODE MAPPING;  Surgeon: Vanderbilt Ned, MD;  Location: [facility];  Service: General;  Laterality: Left;  GEN & PEC BLOCK   PORT-A-CATH REMOVAL N/A 07/20/2022   Procedure: REMOVAL PORT-A-CATH;  Surgeon: Vanderbilt Ned, MD;  Location: [facility];  Service: General;  Laterality: N/A;   PORTACATH PLACEMENT N/A 09/08/2021   Procedure: INSERTION PORT-A-CATH;  Surgeon: Vanderbilt Ned, MD;  Location: [facility];  Service: General;  Laterality: N/A;   Patient Active Problem List   Diagnosis Date Noted   Pain in right hip 05/05/2023   Port-A-Cath in place 09/16/2021   Genetic testing 08/31/2021   Malignant neoplasm of lower-inner quadrant of left breast in female, estrogen receptor positive (HCC) 08/17/2021   Anxiety 08/06/2019   Plantar fasciitis 08/06/2019   Fluid collection (edema) in the arms, legs, hands and feet 05/02/2018    REFERRING DIAG: left breast cancer at risk for lymphedema  THERAPY DIAG:  No diagnosis found.  PERTINENT HISTORY:  Patient was diagnosed on 08/07/2021  with left grade III invasive ductal carcinoma breast cancer. It measures 2.8 cm and is located in the lower inner quadrant. It is weakly (2%) ER positive, PR and HER2 negative with a Ki67 of 40%. 03/16/22- L breast seed lumpectomy and SLNB 0/2. Had post op seroma with drainage from inframammary incision as well as an additional one inferior to axillary incision   PRECAUTIONS: left UE Lymphedema risk,   SUBJECTIVE:  Left breast tenderness/pain in the last couple months  PAIN:  Are you having pain? Left breast tenderness/pain  SOZO SCREENING: Patient was assessed today using the SOZO machine to determine the lymphedema index score. This was compared to her baseline score. It was determined that she is within the recommended range when compared to her baseline and no further action is needed at this time. She will continue SOZO screenings. These are done every 3 months for 2 years post operatively followed by every 6 months for 2 years, and then annually.   She is 6.1 over baseline and was scheduled for next baseline in 1 month. We also discussed wearing compression bra due to fairly new breast pain, and advised that therapy can help if there is swelling. She has her mammogram at the end of this month and then a follow up with MD. Myles to continue to check for UE swelling and if noticing anything to get in touch with us .   Grayce JINNY Sheldon, PT 09/08/2023, 10:14 PM

## 2023-09-09 ENCOUNTER — Ambulatory Visit: Payer: Federal, State, Local not specified - PPO | Attending: Hematology

## 2023-09-09 DIAGNOSIS — Z483 Aftercare following surgery for neoplasm: Secondary | ICD-10-CM | POA: Insufficient documentation

## 2023-09-09 DIAGNOSIS — Z170 Estrogen receptor positive status [ER+]: Secondary | ICD-10-CM | POA: Insufficient documentation

## 2023-09-09 DIAGNOSIS — C50312 Malignant neoplasm of lower-inner quadrant of left female breast: Secondary | ICD-10-CM | POA: Insufficient documentation

## 2023-09-10 ENCOUNTER — Other Ambulatory Visit: Payer: Self-pay

## 2023-09-30 ENCOUNTER — Ambulatory Visit
Admission: RE | Admit: 2023-09-30 | Discharge: 2023-09-30 | Disposition: A | Discharge status: Home / Self Care | Payer: Federal, State, Local not specified - PPO | Source: Ambulatory Visit | Attending: Hematology | Admitting: Hematology

## 2023-09-30 DIAGNOSIS — Z853 Personal history of malignant neoplasm of breast: Secondary | ICD-10-CM

## 2023-09-30 DIAGNOSIS — Z09 Encounter for follow-up examination after completed treatment for conditions other than malignant neoplasm: Secondary | ICD-10-CM | POA: Diagnosis not present

## 2023-10-07 ENCOUNTER — Telehealth: Payer: Self-pay | Admitting: Nurse Practitioner

## 2023-10-07 DIAGNOSIS — N9489 Other specified conditions associated with female genital organs and menstrual cycle: Secondary | ICD-10-CM | POA: Diagnosis not present

## 2023-10-07 DIAGNOSIS — N907 Vulvar cyst: Secondary | ICD-10-CM | POA: Diagnosis not present

## 2023-10-07 NOTE — Telephone Encounter (Signed)
 Patient called and stated that she needed a refill on Dilitiazem 2% gel. Patient is requesting a call back. Please advise.

## 2023-10-10 MED ORDER — DILTIAZEM GEL 2 %: 1.0000 | Freq: Three times a day (TID) | CUTANEOUS | 0 refills | Status: AC

## 2023-10-10 NOTE — Telephone Encounter (Signed)
 Patient returned call to the office stating she has been having issues with BRBPR after a bowel movement.  Patient does not feel that she is constipated or having to strain to produce BM.  Patient advised that with her hx of anal fissure, an Rx for Diltiazem gel would be sent to [facility], but that she will also need to be evaluated in the office.  Patient scheduled to follow up with Alcide Evener, NP on 10-28-23.  Patient agreed to plan and verbalized understanding.  No further questions.

## 2023-10-10 NOTE — Telephone Encounter (Signed)
 Left message for patient to return call to our office to further discuss.

## 2023-10-11 ENCOUNTER — Other Ambulatory Visit: Payer: Self-pay

## 2023-10-14 ENCOUNTER — Ambulatory Visit: Payer: Federal, State, Local not specified - PPO | Attending: Hematology | Admitting: Rehabilitation

## 2023-10-14 DIAGNOSIS — C50312 Malignant neoplasm of lower-inner quadrant of left female breast: Secondary | ICD-10-CM | POA: Insufficient documentation

## 2023-10-14 DIAGNOSIS — Z170 Estrogen receptor positive status [ER+]: Secondary | ICD-10-CM

## 2023-10-14 DIAGNOSIS — Z483 Aftercare following surgery for neoplasm: Secondary | ICD-10-CM

## 2023-10-14 NOTE — Therapy (Signed)
 OUTPATIENT PHYSICAL THERAPY SOZO SCREENING NOTE   Patient Name: Cheyenne [name] MRN: [id number] DOB:November 28, 1980, 43 y.o., female Today's Date: 10/14/2023  PCP: Deeann Saint, MD REFERRING PROVIDER: Malachy Mood, MD   PT End of Session - 10/14/23 [phone]     Visit Number 4   screen   PT Start Time 0800    PT Stop Time 0804    PT Time Calculation (min) 4 min    Activity Tolerance Patient tolerated treatment well    Behavior During Therapy [facility] for tasks assessed/performed             Past Medical History:  Diagnosis Date   Anal fissure    Anxiety    Breast cancer (HCC) 08/10/21   Date diagnosis was given   Complication of anesthesia 03/16/2022   sore on top of mouth   DVT (deep venous thrombosis) (HCC)    History of radiation therapy    Left breast- 05/05/22- 06/20/22- Dr. Antony Blackbird   Incomplete right bundle branch block (RBBB) determined by electrocardiography 09/01/2021   Personal history of radiation therapy    Past Surgical History:  Procedure Laterality Date   BREAST LUMPECTOMY     BREAST LUMPECTOMY WITH RADIOACTIVE SEED AND SENTINEL LYMPH NODE BIOPSY Left 03/16/2022   Procedure: LEFT BREAST SEED LUMPECTOMY, LEFT SENTINEL LYMPH NODE MAPPING;  Surgeon: Harriette Bouillon, MD;  Location: [facility];  Service: General;  Laterality: Left;  GEN & PEC BLOCK   PORT-A-CATH REMOVAL N/A 07/20/2022   Procedure: REMOVAL PORT-A-CATH;  Surgeon: Harriette Bouillon, MD;  Location: [facility];  Service: General;  Laterality: N/A;   PORTACATH PLACEMENT N/A 09/08/2021   Procedure: INSERTION PORT-A-CATH;  Surgeon: Harriette Bouillon, MD;  Location: [facility];  Service: General;  Laterality: N/A;   Patient Active Problem List   Diagnosis Date Noted   Pain in right hip 05/05/2023   Port-A-Cath in place 09/16/2021   Genetic testing 08/31/2021   Malignant neoplasm of lower-inner quadrant of left breast in female, estrogen receptor positive (HCC) 08/17/2021   Anxiety  08/06/2019   Plantar fasciitis 08/06/2019   Fluid collection (edema) in the arms, legs, hands and feet 05/02/2018    REFERRING DIAG: left breast cancer at risk for lymphedema  THERAPY DIAG:  Malignant neoplasm of lower-inner quadrant of left breast in female, estrogen receptor positive (HCC)  Aftercare following surgery for neoplasm  PERTINENT HISTORY:  Patient was diagnosed on 08/07/2021 with left grade III invasive ductal carcinoma breast cancer. It measures 2.8 cm and is located in the lower inner quadrant. It is weakly (2%) ER positive, PR and HER2 negative with a Ki67 of 40%. 03/16/22- L breast seed lumpectomy and SLNB 0/2. Had post op seroma with drainage from inframammary incision as well as an additional one inferior to axillary incision   PRECAUTIONS: left UE Lymphedema risk,   SUBJECTIVE:  Left breast tenderness/pain in the last couple months  PAIN:  Are you having pain? My arm hurts a bit today for some reason  SOZO SCREENING: Patient was assessed today using the SOZO machine to determine the lymphedema index score. This was compared to her baseline score. It was determined that she is within the recommended range when compared to her baseline and no further action is needed at this time. She will continue SOZO screenings. These are done every 3 months for 2 years post operatively followed by every 6 months for 2 years, and then annually.    Idamae Lusher, PT  10/14/2023, 8:44 AM

## 2023-10-15 DIAGNOSIS — M5412 Radiculopathy, cervical region: Secondary | ICD-10-CM | POA: Diagnosis not present

## 2023-10-15 DIAGNOSIS — G5692 Unspecified mononeuropathy of left upper limb: Secondary | ICD-10-CM | POA: Diagnosis not present

## 2023-10-15 DIAGNOSIS — M542 Cervicalgia: Secondary | ICD-10-CM | POA: Diagnosis not present

## 2023-10-16 ENCOUNTER — Other Ambulatory Visit: Payer: Self-pay

## 2023-10-28 ENCOUNTER — Ambulatory Visit: Admitting: Nurse Practitioner

## 2023-10-28 ENCOUNTER — Other Ambulatory Visit

## 2023-10-28 ENCOUNTER — Encounter: Payer: Self-pay | Admitting: Nurse Practitioner

## 2023-10-28 VITALS — BP 100/50 | HR 104 | Ht 61.0 in | Wt 217.4 lb

## 2023-10-28 DIAGNOSIS — Z853 Personal history of malignant neoplasm of breast: Secondary | ICD-10-CM

## 2023-10-28 DIAGNOSIS — K625 Hemorrhage of anus and rectum: Secondary | ICD-10-CM

## 2023-10-28 DIAGNOSIS — D649 Anemia, unspecified: Secondary | ICD-10-CM | POA: Diagnosis not present

## 2023-10-28 DIAGNOSIS — K602 Anal fissure, unspecified: Secondary | ICD-10-CM | POA: Diagnosis not present

## 2023-10-28 DIAGNOSIS — K6289 Other specified diseases of anus and rectum: Secondary | ICD-10-CM | POA: Diagnosis not present

## 2023-10-28 DIAGNOSIS — Z860101 Personal history of adenomatous and serrated colon polyps: Secondary | ICD-10-CM

## 2023-10-28 LAB — CBC WITH DIFFERENTIAL/PLATELET
Basophils Absolute: 0.1 10*3/uL (ref 0.0–0.1)
Basophils Relative: 1 % (ref 0.0–3.0)
Eosinophils Absolute: 0.3 10*3/uL (ref 0.0–0.7)
Eosinophils Relative: 3.7 % (ref 0.0–5.0)
HCT: 38.6 % (ref 36.0–46.0)
Hemoglobin: 12.8 g/dL (ref 12.0–15.0)
Lymphocytes Relative: 27.9 % (ref 12.0–46.0)
Lymphs Abs: 2.1 10*3/uL (ref 0.7–4.0)
MCHC: 33.1 g/dL (ref 30.0–36.0)
MCV: 94.3 fl (ref 78.0–100.0)
Monocytes Absolute: 0.6 10*3/uL (ref 0.1–1.0)
Monocytes Relative: 7.6 % (ref 3.0–12.0)
Neutro Abs: 4.5 10*3/uL (ref 1.4–7.7)
Neutrophils Relative %: 59.8 % (ref 43.0–77.0)
Platelets: 275 10*3/uL (ref 150.0–400.0)
RBC: 4.1 Mil/uL (ref 3.87–5.11)
RDW: 13.9 % (ref 11.5–15.5)
WBC: 7.5 10*3/uL (ref 4.0–10.5)

## 2023-10-28 LAB — SEDIMENTATION RATE: Sed Rate: 38 mm/h — ABNORMAL HIGH (ref 0–20)

## 2023-10-28 LAB — IBC + FERRITIN
Ferritin: 508.8 ng/mL — ABNORMAL HIGH (ref 10.0–291.0)
Iron: 63 ug/dL (ref 42–145)
Saturation Ratios: 22.5 % (ref 20.0–50.0)
TIBC: 280 ug/dL (ref 250.0–450.0)
Transferrin: 200 mg/dL — ABNORMAL LOW (ref 212.0–360.0)

## 2023-10-28 LAB — COMPREHENSIVE METABOLIC PANEL WITH GFR
ALT: 34 U/L (ref 0–35)
AST: 26 U/L (ref 0–37)
Albumin: 4.1 g/dL (ref 3.5–5.2)
Alkaline Phosphatase: 82 U/L (ref 39–117)
BUN: 16 mg/dL (ref 6–23)
CO2: 31 meq/L (ref 19–32)
Calcium: 9.1 mg/dL (ref 8.4–10.5)
Chloride: 100 meq/L (ref 96–112)
Creatinine, Ser: 1.17 mg/dL (ref 0.40–1.20)
GFR: 57.68 mL/min — ABNORMAL LOW (ref >60.00)
Glucose, Bld: 74 mg/dL (ref 70–99)
Potassium: 3.3 meq/L — ABNORMAL LOW (ref 3.5–5.1)
Sodium: 140 meq/L (ref 135–145)
Total Bilirubin: 0.6 mg/dL (ref 0.2–1.2)
Total Protein: 7.3 g/dL (ref 6.0–8.3)

## 2023-10-28 LAB — B12 AND FOLATE PANEL
Folate: >25.2 ng/mL (ref >5.9)
Vitamin B-12: 280 pg/mL (ref 211–911)

## 2023-10-28 LAB — C-REACTIVE PROTEIN: CRP: 3 mg/dL (ref 0.5–20.0)

## 2023-10-28 NOTE — Patient Instructions (Addendum)
 Your provider has requested that you go to the basement level for lab work before leaving today. Press "B" on the elevator. The lab is located at the first door on the left as you exit the elevator.  You will be contacted by [facility] Scheduling in the next 2 days to arrange a pelvic MRI.  The number on your caller ID will be [phone], please answer when they call.  If you have not heard from them in 2 days please call [phone] to schedule.    Miralax- take every night at bedtime as needed  Benefiber- take 1 tablespoon daily  Contact our office if rectal bleeding or rectal pain worsens.  Due to recent changes in healthcare laws, you may see the results of your imaging and laboratory studies on MyChart before your provider has had a chance to review them.  We understand that in some cases there may be results that are confusing or concerning to you. Not all laboratory results come back in the same time frame and the provider may be waiting for multiple results in order to interpret others.  Please give [place] 48 hours in order for your provider to thoroughly review all the results before contacting the office for clarification of your results.   Thank you for trusting me with your gastrointestinal care!   Alcide Evener, CRNP

## 2023-10-28 NOTE — Progress Notes (Signed)
 10/28/2023 Cheyenne [name] [id number] Aug 12, 1980   Chief Complaint: Anal fissure follow up  History of Present Illness:  Cheyenne [name] is a 43 year old meal with a past medical history of anxiety, DVT, breast cancer 08/2021 treated with neoadjuvant chemotherapy and immunotherapy s/p lumpectomy 03/2022 and adjuvant radiation therapy completed 06/2022 and recurrent anal fissure. She is known by Dr. Adela Lank.  She was last seen in office by Willette Cluster, NP 01/17/2023 secondary to rectal bleeding which was secondary to an anal fissure which was treated with diltiazem and lidocaine ointment which she used twice daily without improvement.  She continues to pass bright red blood per the rectum 4 to 5 days weekly.  She describes seeing a small amount of bright red blood on the toilet tissue a few times weekly and sometimes she sees a moderate amount of blood in the toilet water.  She sometimes has anal pain before passing a BM and sometimes after defecation.  She infrequently strains to pass a BM.  She takes ibuprofen as needed.  No abdominal pain.  No fevers.  She underwent a colonoscopy 08/25/2020 which showed an anal fissure and a 3 mm tubular adenomatous polyp removed from the sigmoid colon, internal hemorrhoids and evidence of Crohn's disease.  She was advised to repeat a colonoscopy in 7 years.  No known family history of IBD or colorectal cancer.     Latest Ref Rng & Units 04/20/2023   10:36 AM 12/21/2022   12:08 PM 08/20/2022    9:26 AM  CBC  WBC 4.0 - 10.5 K/uL 7.6  7.7  5.6   Hemoglobin 12.0 - 15.0 g/dL 40.9  81.1  91.4   Hematocrit 36.0 - 46.0 % 35.9  34.0  38.7   Platelets 150 - 400 K/uL 294  283  272        Latest Ref Rng & Units 04/20/2023   10:36 AM 12/21/2022   12:08 PM 08/20/2022    9:26 AM  CMP  Glucose 70 - 99 mg/dL 782  956  77   BUN 6 - 20 mg/dL 16  12  13    Creatinine 0.44 - 1.00 mg/dL 2.13  0.86  5.78   Sodium 135 - 145 mmol/L 140  140  139   Potassium 3.5 - 5.1  mmol/L 3.7  3.2  3.6   Chloride 98 - 111 mmol/L 104  103  102   CO2 22 - 32 mmol/L 30  30  29    Calcium 8.9 - 10.3 mg/dL 9.4  9.3  46.9   Total Protein 6.5 - 8.1 g/dL 7.5  7.4  7.7   Total Bilirubin 0.3 - 1.2 mg/dL 0.4  0.6  0.6   Alkaline Phos 38 - 126 U/L 101  97  101   AST 15 - 41 U/L 17  20  19    ALT 0 - 44 U/L 15  20  20      Colonoscopy 08/25/2020 for rectal bleeding and persistent anal fissure: - Anterior midline anal fissure found on perianal exam.  - The examined portion of the ileum was normal. - One 3 mm polyp in the sigmoid colon, removed with a cold snare. Resected and retrieved. - Internal hemorrhoids  - Recall colonoscopy 7 years  Current Outpatient Medications on File Prior to Visit  Medication Sig Dispense Refill   acetaminophen (TYLENOL) 500 MG tablet Take 500-1,000 mg by mouth every 6 (six) hours as needed (pain.).     albuterol (VENTOLIN  HFA) 108 (90 Base) MCG/ACT inhaler Inhale 2 puffs into the lungs every 4 (four) hours as needed for wheezing. 1 each 5   ALPRAZolam (XANAX) 0.5 MG tablet Take one tablet twice daily as needed for anxiety. 45 tablet 2   cetirizine (ZYRTEC) 10 MG tablet Take 10 mg by mouth as needed.     desonide (DESOWEN) 0.05 % cream Apply topically 2 (two) times daily. (Patient taking differently: Apply 1 application  topically 2 (two) times daily as needed (skin irritation.).) 30 g 1   desonide (DESOWEN) 0.05 % lotion Apply 1 application. topically daily as needed (acne).     diltiazem 2 % GEL Apply 1 Application topically 3 (three) times daily. Diltiazem 2% apply three times daily for 8 weeks as instructed 30 g 0   diphenhydrAMINE (BENADRYL) 25 mg capsule Take 25 mg by mouth as needed.     hydroquinone 4 % cream APPLY TO DARK SPOTS AT BEDTIME 28.35 g 0   ibuprofen (ADVIL) 800 MG tablet Take 1 tablet (800 mg total) by mouth every 8 (eight) hours as needed. 30 tablet 0   loratadine (CLARITIN) 10 MG tablet Take 10 mg by mouth daily.     meloxicam  (MOBIC) 15 MG tablet Take 1 tablet (15 mg total) by mouth daily. 30 tablet 0   Multiple Vitamins-Minerals (ONE A DAY IMMUNITY DEFENSE) CHEW Chew 1 tablet by mouth daily.     cyclobenzaprine (FLEXERIL) 10 MG tablet Take 10 mg by mouth daily as needed for muscle spasms. (Patient not taking: Reported on 10/28/2023)     EPINEPHrine 0.3 mg/0.3 mL IJ SOAJ injection Inject 0.3 mg into the muscle as needed for anaphylaxis. (Patient not taking: Reported on 10/28/2023) 1 each 0   [DISCONTINUED] prochlorperazine (COMPAZINE) 10 MG tablet Take 1 tablet (10 mg total) by mouth every 6 (six) hours as needed (Nausea or vomiting). 30 tablet 1   No current facility-administered medications on file prior to visit.   No Known Allergies  Current Medications, Allergies, Past Medical History, Past Surgical History, Family History and Social History were reviewed in [employer] record.  Review of Systems:   Constitutional: Negative for fever, sweats, chills or weight loss.  Respiratory: Negative for shortness of breath.   Cardiovascular: Negative for chest pain, palpitations and leg swelling.  Gastrointestinal: See HPI.  Musculoskeletal: Negative for back pain or muscle aches.  Neurological: Negative for dizziness, headaches or paresthesias.   Physical Exam: Ht 5\' 1"  (1.549 m)   Wt 217 lb 6 oz (98.6 kg)   BMI 41.07 kg/m  General: 43 year old female in no acute distress. Head: Normocephalic and atraumatic. Eyes: No scleral icterus. Conjunctiva pink . Ears: Normal auditory acuity. Mouth: Dentition intact. No ulcers or lesions.  Lungs: Clear throughout to auscultation. Heart: Regular rate and rhythm, no murmur. Abdomen: Soft, nontender and nondistended. No masses or hepatomegaly. Normal bowel sounds x 4 quadrants.  Rectal: Anterior fissure vs fistula opening oozing a small amount of thin golden brown drainage with  ? fistula tract palpated on internal anal exam. A small open nonbleeding  fissure within the left lateral anal hemorrhoid fold. Sophia CMA present during exam.  Musculoskeletal: Symmetrical with no gross deformities. Extremities: No edema. Neurological: Alert oriented x 4. No focal deficits.  Psychological: Alert and cooperative. Normal mood and affect  Assessment and Recommendations:  43 year old female with a history of an anterior anal fissure with persistent rectal bleeding and anal rectal discomfort. I suspect she has an anal fistula  tract from prior anterior fissure/abscess. A small fissure within the left lateral anal hemorrhoid also identified on exam today. No evidence of Crohn's disease per colonoscopy 08/2020.  -CBC, CMP, Sed rate and CRP -Pelvic MRI with and without contrast  -May require colorectal surgery consult for fistulotomy if MRI confirms an anal fistula  -Flex sig deferred for now -Further recommendations to be determined after the above labs and pelvic MRI results reviewed -Patient to contact our office if she develops fever, chills or worsening rectal pain/bleeding -Benefiber 1 tablespoon daily to avoid straining -MiraLAX nightly as needed  Chronic normocytic anemia.  Hemoglobin 11.9 per labs 04/2023. -CBC, IBC + ferritin and B12 levels   History of a tubular adenomatous polyp removed from the sigmoid colon per colonoscopy 08/2020 -Next colon polyp surveillance colonoscopy due 08/2027  History of breast cancer 08/2021 reated with neoadjuvant chemotherapy and immunotherapy s/p lumpectomy 03/2022 and adjuvant radiation therapy

## 2023-10-28 NOTE — Progress Notes (Signed)
 Agree with assessment and plan as outlined.  MRI pelvis will be most useful to further evaluate for fistula.  If she is having fevers or severe pain in the interim could consider empiric Flagyl 500 twice daily for a few weeks.  Otherwise if stable could monitor for now and await MRI pelvis.  Thank

## 2023-10-31 DIAGNOSIS — N907 Vulvar cyst: Secondary | ICD-10-CM | POA: Diagnosis not present

## 2023-11-03 ENCOUNTER — Encounter: Payer: Self-pay | Admitting: Hematology

## 2023-11-03 LAB — SIGNATERA
SIGNATERA MTM READOUT: 0 MTM/ml
SIGNATERA TEST RESULT: NEGATIVE

## 2023-11-04 ENCOUNTER — Ambulatory Visit (HOSPITAL_COMMUNITY)
Admission: RE | Admit: 2023-11-04 | Discharge: 2023-11-04 | Disposition: A | Discharge status: Home / Self Care | Source: Ambulatory Visit | Attending: Nurse Practitioner | Admitting: Nurse Practitioner

## 2023-11-04 DIAGNOSIS — K602 Anal fissure, unspecified: Secondary | ICD-10-CM | POA: Diagnosis not present

## 2023-11-04 MED ORDER — GADOBUTROL 1 MMOL/ML IV SOLN
10.0000 mL | Freq: Once | INTRAVENOUS | Status: AC | PRN
  Administered 2023-11-04: 10 mL via INTRAVENOUS

## 2023-11-09 ENCOUNTER — Other Ambulatory Visit: Payer: Self-pay | Admitting: Nurse Practitioner

## 2023-11-09 MED ORDER — POTASSIUM CHLORIDE CRYS ER 20 MEQ PO TBCR: 20.0000 meq | EXTENDED_RELEASE_TABLET | Freq: Two times a day (BID) | ORAL | Status: AC

## 2023-11-15 NOTE — Telephone Encounter (Signed)
 Patient scheduled to see general surgery on 12/06/23

## 2023-12-06 DIAGNOSIS — K601 Chronic anal fissure: Secondary | ICD-10-CM | POA: Diagnosis not present

## 2023-12-13 DIAGNOSIS — Z124 Encounter for screening for malignant neoplasm of cervix: Secondary | ICD-10-CM | POA: Diagnosis not present

## 2023-12-13 DIAGNOSIS — Z01419 Encounter for gynecological examination (general) (routine) without abnormal findings: Secondary | ICD-10-CM | POA: Diagnosis not present

## 2023-12-13 DIAGNOSIS — Z1331 Encounter for screening for depression: Secondary | ICD-10-CM | POA: Diagnosis not present

## 2023-12-15 ENCOUNTER — Ambulatory Visit: Admitting: Sports Medicine

## 2023-12-15 ENCOUNTER — Other Ambulatory Visit: Payer: Self-pay

## 2023-12-15 ENCOUNTER — Encounter: Payer: Self-pay | Admitting: Sports Medicine

## 2023-12-15 DIAGNOSIS — M160 Bilateral primary osteoarthritis of hip: Secondary | ICD-10-CM | POA: Diagnosis not present

## 2023-12-15 DIAGNOSIS — S73192D Other sprain of left hip, subsequent encounter: Secondary | ICD-10-CM | POA: Diagnosis not present

## 2023-12-15 DIAGNOSIS — M25551 Pain in right hip: Secondary | ICD-10-CM

## 2023-12-15 DIAGNOSIS — S73191D Other sprain of right hip, subsequent encounter: Secondary | ICD-10-CM

## 2023-12-15 LAB — HM PAP SMEAR

## 2023-12-15 MED ORDER — METHYLPREDNISOLONE ACETATE 40 MG/ML IJ SUSP
80.0000 mg | INTRAMUSCULAR | Status: AC | PRN
  Administered 2023-12-15: 80 mg via INTRA_ARTICULAR

## 2023-12-15 MED ORDER — LIDOCAINE HCL 1 % IJ SOLN
4.0000 mL | INTRAMUSCULAR | Status: AC | PRN
  Administered 2023-12-15: 4 mL

## 2023-12-15 NOTE — Progress Notes (Signed)
 Cheyenne [name] - 43 y.o. female MRN [id number]  Date of birth: 26-Oct-1980  Office Visit Note: Visit Date: 12/15/2023 PCP: Viola Greulich, MD Referred by: Viola Greulich, MD  Subjective: Chief Complaint  Patient presents with   Right Hip - Pain   Left Hip - Pain   HPI: Cheyenne [name] is a pleasant 43 y.o. female who presents today for chronic bilateral hip pain, R > L.  She does present with her spouse, [name], today during the office visit.  She presents for chronic bilateral hip pain, although right hip is worse than left.  Back in November of last year we did perform ultrasound-guided right intra-articular hip injection, this certainly improved her pain by a large amount for a number of months.  However over the last while both of her hips have been hurting her again, although right is more significant.  Both [name] and her partner noticed that she has not been walking normally and this is limiting her daily activity.  She is using meloxicam  15 mg daily on most days which does help to an extent.  She also is taking gabapentin for a different pathology but notices improvement with this too.  Pertinent ROS were reviewed with the patient and found to be negative unless otherwise specified above in HPI.   Assessment & Plan: Visit Diagnoses:  1. Pain in right hip   2. Bilateral primary osteoarthritis of hip   3. Tear of right acetabular labrum, subsequent encounter   4. Tear of left acetabular labrum, subsequent encounter    Plan: Impression is chronic bilateral hip pain with MRI confirmed high-grade cartilage loss and mild to moderate arthritic change.  The right hip is more exacerbated today.  Through shared decision making, we did proceed with right hip ultrasound-guided injection, patient tolerated well.  Advised on postinjection protocol.  She may use her meloxicam  15 mg daily.  If gabapentin 300 mg as needed is helpful, she may use this as well.  We discussed the  importance of stabilizing her hips and keeping them strong, she did receive some improvement when she was doing some home exercises in the past.  I would like to see her back in a few weeks to evaluate how she does with this hip injection, we can consider this for the contralateral hip to if needed.  Given this, we did discuss the importance of getting into some formalized physical therapy once both hips are feeling well, we will plan to consider referral after our re-evaluation.  She does have bilateral labral tears, although with her high-grade cartilage loss, I do not think she is a strong candidate for hip arthroscopy at this time.  Follow-up: Return in about 2 weeks (around 12/29/2023) f/u b/l hips - plan to get into formal PT  Meds & Orders: No orders of the defined types were placed in this encounter.   Orders Placed This Encounter  Procedures   Large Joint Inj: R hip joint   US  Guided Needle Placement - No Linked Charges    Procedures: Large Joint Inj: R hip joint on 12/15/2023 3:51 PM Indications: pain Details: 22 G 3.5 in needle, ultrasound-guided anterior approach Medications: 4 mL lidocaine  1 %; 80 mg methylPREDNISolone  acetate 40 MG/ML Outcome: tolerated well, no immediate complications  Procedure: US -guided intra-articular hip injection, Right After discussion on risks/benefits/indications and informed verbal consent was obtained, a timeout was performed. Patient was lying supine on exam table. The hip was cleaned with betadine and  alcohol swabs. Then utilizing ultrasound guidance, the patient's femoral head and neck junction was identified and subsequently injected with 4:2 lidocaine :depomedrol via an in-plane approach with ultrasound visualization of the injectate administered into the hip joint. Patient tolerated procedure well without immediate complications.  Procedure, treatment alternatives, risks and benefits explained, specific risks discussed. Consent was given by the  patient. Immediately prior to procedure a time out was called to verify the correct patient, procedure, equipment, support staff and site/side marked as required. Patient was prepped and draped in the usual sterile fashion.          Clinical History: No specialty comments available.  She reports that she has never smoked. She has never used smokeless tobacco. No results for input(s): "HGBA1C", "LABURIC" in the last 8760 hours.  Objective:    Physical Exam  Gen: Well-appearing, in no acute distress; non-toxic CV: Well-perfused. Warm.  Resp: Breathing unlabored on room air; no wheezing. Psych: Fluid speech in conversation; appropriate affect; normal thought process  Ortho Exam - Bilateral hips: There is pain with endrange internal and external logroll R > L.  There is no hip effusion noted.  Positive FADIR and Stinchfield test right hip.  Patient walks with a mildly antalgic gait with the leg leg swing bilaterally.  Imaging:  Independent review and interpretation of right hip MRI from 06/10/2023 was performed by myself today.  MRI demonstrates patches of high-grade and near full-thickness cartilage loss in both hips.  There is certainly more cartilage loss and joint space narrowing on MRI then compared to her hip x-rays from October 2024.  There is degenerative labral tearing with paralabral cystic formation right greater than left.  There is subchondral cystic change near the acetabulum, more so in the rim of the bilateral hips.  Narrative & Impression  CLINICAL DATA:  Right hip pain radiating into the right groin and thigh for the past 6 months. No injury or prior surgery.   EXAM: MRI OF THE RIGHT HIP WITH CONTRAST (MR ARTHROGRAM)   TECHNIQUE: Multiplanar, multisequence MR imaging was performed following the administration of intra-articular contrast.   CONTRAST:  See injection documentation.   COMPARISON:  Right hip x-rays dated May 05, 2023.   FINDINGS: Bones: There is no  evidence of acute fracture, dislocation or avascular necrosis. No focal bone lesion. The visualized sacroiliac joints and symphysis pubis appear normal.   Articular cartilage and labrum   Articular cartilage: High-grade partial and full-thickness cartilage loss in the anterior superior hip joints bilaterally, with prominent subchondral cystic change in both anterior acetabula.   Labrum: Bilateral anterior superior labral tears with small paralabral cysts.   Joint or bursal effusion   Joint effusion: The right hip joint is distended with intra-articular contrast. No left hip joint effusion.   Bursae: No focal periarticular fluid collection.   Muscles and tendons   Muscles and tendons: Mild bilateral gluteal peritendinitis. Mild left hamstring origin tendinosis. Normal iliopsoas tendons. No muscle edema or atrophy.   Other findings   Miscellaneous: The visualized internal pelvic contents appear unremarkable.   IMPRESSION: 1. Mild to moderate bilateral hip osteoarthritis. 2. Bilateral anterior superior labral tears with small paralabral cysts. 3. Mild bilateral gluteal peritendinitis. 4. Mild left hamstring origin tendinosis.     Electronically Signed   By: Aleta Anda M.D.   On: 06/21/2023 14:54    Past Medical/Family/Surgical/Social History: Medications & Allergies reviewed per EMR, new medications updated. Patient Active Problem List   Diagnosis Date Noted   Pain in  right hip 05/05/2023   Port-A-Cath in place 09/16/2021   Genetic testing 08/31/2021   Malignant neoplasm of lower-inner quadrant of left breast in female, estrogen receptor positive (HCC) 08/17/2021   Anxiety 08/06/2019   Plantar fasciitis 08/06/2019   Fluid collection (edema) in the arms, legs, hands and feet 05/02/2018   Past Medical History:  Diagnosis Date   Anal fissure    Anxiety    Breast cancer (HCC) 08/10/21   Date diagnosis was given   Complication of anesthesia 03/16/2022   sore  on top of mouth   DVT (deep venous thrombosis) (HCC)    History of radiation therapy    Left breast- 05/05/22- 06/20/22- Dr. Retta Caster   Incomplete right bundle branch block (RBBB) determined by electrocardiography 09/01/2021   Personal history of radiation therapy    Family History  Problem Relation Age of Onset   Hypertension Mother    Edema Mother    Diabetes Mother    Obesity Mother    Diabetes Sister    Obesity Sister    Breast cancer Other 17   Breast cancer Other 29       dx. with breast cancer again at age 43 (unsure if recurrence or second primary)   Colon cancer Neg Hx    Esophageal cancer Neg Hx    Rectal cancer Neg Hx    Past Surgical History:  Procedure Laterality Date   BREAST LUMPECTOMY     BREAST LUMPECTOMY WITH RADIOACTIVE SEED AND SENTINEL LYMPH NODE BIOPSY Left 03/16/2022   Procedure: LEFT BREAST SEED LUMPECTOMY, LEFT SENTINEL LYMPH NODE MAPPING;  Surgeon: Sim Dryer, MD;  Location: [facility];  Service: General;  Laterality: Left;  GEN & PEC BLOCK   PORT-A-CATH REMOVAL N/A 07/20/2022   Procedure: REMOVAL PORT-A-CATH;  Surgeon: Sim Dryer, MD;  Location: [facility];  Service: General;  Laterality: N/A;   PORTACATH PLACEMENT N/A 09/08/2021   Procedure: INSERTION PORT-A-CATH;  Surgeon: Sim Dryer, MD;  Location: [facility];  Service: General;  Laterality: N/A;   Social History   Occupational History   Occupation: Claims Rep  Tobacco Use   Smoking status: Never   Smokeless tobacco: Never  Vaping Use   Vaping status: Never Used  Substance and Sexual Activity   Alcohol use: Yes    Comment: Occasionally   Drug use: No   Sexual activity: Yes    Partners: Female

## 2023-12-17 ENCOUNTER — Encounter: Payer: Self-pay | Admitting: Sports Medicine

## 2023-12-19 ENCOUNTER — Other Ambulatory Visit: Payer: Self-pay | Admitting: Sports Medicine

## 2023-12-19 MED ORDER — CYCLOBENZAPRINE HCL 10 MG PO TABS: 10.0000 mg | ORAL_TABLET | Freq: Every day | ORAL | 0 refills | Status: DC | PRN

## 2023-12-20 DIAGNOSIS — N94819 Vulvodynia, unspecified: Secondary | ICD-10-CM | POA: Diagnosis not present

## 2023-12-30 ENCOUNTER — Ambulatory Visit: Admitting: Sports Medicine

## 2024-01-06 ENCOUNTER — Encounter: Payer: Self-pay | Admitting: Rehabilitation

## 2024-01-06 ENCOUNTER — Ambulatory Visit: Attending: Hematology | Admitting: Rehabilitation

## 2024-01-06 DIAGNOSIS — Z483 Aftercare following surgery for neoplasm: Secondary | ICD-10-CM | POA: Insufficient documentation

## 2024-01-06 DIAGNOSIS — Z170 Estrogen receptor positive status [ER+]: Secondary | ICD-10-CM | POA: Insufficient documentation

## 2024-01-06 DIAGNOSIS — C50312 Malignant neoplasm of lower-inner quadrant of left female breast: Secondary | ICD-10-CM | POA: Insufficient documentation

## 2024-01-06 NOTE — Therapy (Signed)
  OUTPATIENT PHYSICAL THERAPY SOZO SCREENING NOTE   Patient Name: Cheyenne [name] MRN: [id number] DOB:Jul 03, 1981, 44 y.o., female Today's Date: 01/06/2024  PCP: Viola Greulich, MD REFERRING PROVIDER: Sonja Darn[place], MD   PT End of Session - 01/06/24 0804     Visit Number 4   screen only   PT Start Time 0800    PT Stop Time 0803    PT Time Calculation (min) 3 min             Past Medical History:  Diagnosis Date   Anal fissure    Anxiety    Breast cancer (HCC) 08/10/21   Date diagnosis was given   Complication of anesthesia 03/16/2022   sore on top of mouth   DVT (deep venous thrombosis) (HCC)    History of radiation therapy    Left breast- 05/05/22- 06/20/22- Dr. Retta Caster   Incomplete right bundle branch block (RBBB) determined by electrocardiography 09/01/2021   Personal history of radiation therapy    Past Surgical History:  Procedure Laterality Date   BREAST LUMPECTOMY     BREAST LUMPECTOMY WITH RADIOACTIVE SEED AND SENTINEL LYMPH NODE BIOPSY Left 03/16/2022   Procedure: LEFT BREAST SEED LUMPECTOMY, LEFT SENTINEL LYMPH NODE MAPPING;  Surgeon: Sim Dryer, MD;  Location: [facility];  Service: General;  Laterality: Left;  GEN & PEC BLOCK   PORT-A-CATH REMOVAL N/A 07/20/2022   Procedure: REMOVAL PORT-A-CATH;  Surgeon: Sim Dryer, MD;  Location: [facility];  Service: General;  Laterality: N/A;   PORTACATH PLACEMENT N/A 09/08/2021   Procedure: INSERTION PORT-A-CATH;  Surgeon: Sim Dryer, MD;  Location: [facility];  Service: General;  Laterality: N/A;   Patient Active Problem List   Diagnosis Date Noted   Pain in right hip 05/05/2023   Port-A-Cath in place 09/16/2021   Genetic testing 08/31/2021   Malignant neoplasm of lower-inner quadrant of left breast in female, estrogen receptor positive (HCC) 08/17/2021   Anxiety 08/06/2019   Plantar fasciitis 08/06/2019   Fluid collection (edema) in the arms, legs, hands and feet  05/02/2018    REFERRING DIAG: left breast cancer at risk for lymphedema  THERAPY DIAG:  Malignant neoplasm of lower-inner quadrant of left breast in female, estrogen receptor positive (HCC)  Aftercare following surgery for neoplasm  PERTINENT HISTORY:  Patient was diagnosed on 08/07/2021 with left grade III invasive ductal carcinoma breast cancer. It measures 2.8 cm and is located in the lower inner quadrant. It is weakly (2%) ER positive, PR and HER2 negative with a Ki67 of 40%. 03/16/22- L breast seed lumpectomy and SLNB 0/2. Had post op seroma with drainage from inframammary incision as well as an additional one inferior to axillary incision   PRECAUTIONS: left UE Lymphedema risk,   SUBJECTIVE:   PAIN:  Are you having pain? No  SOZO SCREENING: Patient was assessed today using the SOZO machine to determine the lymphedema index score. This was compared to her baseline score. It was determined that she is within the recommended range when compared to her baseline and no further action is needed at this time. She will continue SOZO screenings. These are done every 3 months for 2 years post operatively followed by every 6 months for 2 years, and then annually.    Encarnacion Harris, PT 01/06/2024, 8:05 AM

## 2024-01-08 ENCOUNTER — Other Ambulatory Visit: Payer: Self-pay

## 2024-01-12 DIAGNOSIS — N898 Other specified noninflammatory disorders of vagina: Secondary | ICD-10-CM | POA: Diagnosis not present

## 2024-01-17 ENCOUNTER — Other Ambulatory Visit: Payer: Self-pay | Admitting: Sports Medicine

## 2024-01-20 ENCOUNTER — Other Ambulatory Visit: Payer: Self-pay | Admitting: Family Medicine

## 2024-01-20 ENCOUNTER — Encounter: Payer: Self-pay | Admitting: Family Medicine

## 2024-01-20 ENCOUNTER — Ambulatory Visit (INDEPENDENT_AMBULATORY_CARE_PROVIDER_SITE_OTHER): Admitting: Family Medicine

## 2024-01-20 ENCOUNTER — Ambulatory Visit: Payer: Self-pay | Admitting: Family Medicine

## 2024-01-20 VITALS — BP 122/78 | HR 91 | Temp 97.7°F | Ht 61.0 in | Wt 224.0 lb

## 2024-01-20 DIAGNOSIS — F419 Anxiety disorder, unspecified: Secondary | ICD-10-CM

## 2024-01-20 DIAGNOSIS — Z170 Estrogen receptor positive status [ER+]: Secondary | ICD-10-CM

## 2024-01-20 DIAGNOSIS — Z1159 Encounter for screening for other viral diseases: Secondary | ICD-10-CM | POA: Diagnosis not present

## 2024-01-20 DIAGNOSIS — M1611 Unilateral primary osteoarthritis, right hip: Secondary | ICD-10-CM

## 2024-01-20 DIAGNOSIS — C50312 Malignant neoplasm of lower-inner quadrant of left female breast: Secondary | ICD-10-CM

## 2024-01-20 DIAGNOSIS — E876 Hypokalemia: Secondary | ICD-10-CM

## 2024-01-20 DIAGNOSIS — Z0000 Encounter for general adult medical examination without abnormal findings: Secondary | ICD-10-CM

## 2024-01-20 LAB — COMPREHENSIVE METABOLIC PANEL WITH GFR
ALT: 20 U/L (ref 0–35)
AST: 21 U/L (ref 0–37)
Albumin: 4.5 g/dL (ref 3.5–5.2)
Alkaline Phosphatase: 85 U/L (ref 39–117)
BUN: 12 mg/dL (ref 6–23)
CO2: 29 meq/L (ref 19–32)
Calcium: 9.7 mg/dL (ref 8.4–10.5)
Chloride: 99 meq/L (ref 96–112)
Creatinine, Ser: 0.74 mg/dL (ref 0.40–1.20)
GFR: 99.78 mL/min (ref >60.00)
Glucose, Bld: 90 mg/dL (ref 70–99)
Potassium: 3.3 meq/L — ABNORMAL LOW (ref 3.5–5.1)
Sodium: 138 meq/L (ref 135–145)
Total Bilirubin: 0.8 mg/dL (ref 0.2–1.2)
Total Protein: 7.9 g/dL (ref 6.0–8.3)

## 2024-01-20 LAB — CBC WITH DIFFERENTIAL/PLATELET
Basophils Absolute: 0 10*3/uL (ref 0.0–0.1)
Basophils Relative: 0.4 % (ref 0.0–3.0)
Eosinophils Absolute: 0.1 10*3/uL (ref 0.0–0.7)
Eosinophils Relative: 1.7 % (ref 0.0–5.0)
HCT: 37.2 % (ref 36.0–46.0)
Hemoglobin: 12.5 g/dL (ref 12.0–15.0)
Lymphocytes Relative: 32.8 % (ref 12.0–46.0)
Lymphs Abs: 2.8 10*3/uL (ref 0.7–4.0)
MCHC: 33.6 g/dL (ref 30.0–36.0)
MCV: 90.9 fl (ref 78.0–100.0)
Monocytes Absolute: 0.5 10*3/uL (ref 0.1–1.0)
Monocytes Relative: 6 % (ref 3.0–12.0)
Neutro Abs: 5 10*3/uL (ref 1.4–7.7)
Neutrophils Relative %: 59.1 % (ref 43.0–77.0)
Platelets: 312 10*3/uL (ref 150.0–400.0)
RBC: 4.1 Mil/uL (ref 3.87–5.11)
RDW: 13.5 % (ref 11.5–15.5)
WBC: 8.5 10*3/uL (ref 4.0–10.5)

## 2024-01-20 LAB — LIPID PANEL
Cholesterol: 240 mg/dL — ABNORMAL HIGH (ref 0–200)
HDL: 46.5 mg/dL (ref >39.00)
LDL Cholesterol: 165 mg/dL — ABNORMAL HIGH (ref 0–99)
NonHDL: 193.28
Total CHOL/HDL Ratio: 5
Triglycerides: 142 mg/dL (ref 0.0–149.0)
VLDL: 28.4 mg/dL (ref 0.0–40.0)

## 2024-01-20 LAB — T4, FREE: Free T4: 0.93 ng/dL (ref 0.60–1.60)

## 2024-01-20 LAB — TSH: TSH: 2.5 u[IU]/mL (ref 0.35–5.50)

## 2024-01-20 LAB — VITAMIN B12: Vitamin B-12: 302 pg/mL (ref 211–911)

## 2024-01-20 LAB — HEMOGLOBIN A1C: Hgb A1c MFr Bld: 6.2 % (ref 4.6–6.5)

## 2024-01-20 LAB — VITAMIN D 25 HYDROXY (VIT D DEFICIENCY, FRACTURES): VITD: 30 ng/mL (ref 30.00–100.00)

## 2024-01-20 MED ORDER — POTASSIUM CHLORIDE CRYS ER 20 MEQ PO TBCR: 20.0000 meq | EXTENDED_RELEASE_TABLET | Freq: Every day | ORAL | 0 refills | Status: AC

## 2024-01-20 MED ORDER — GABAPENTIN 100 MG PO CAPS: 100.0000 mg | ORAL_CAPSULE | Freq: Three times a day (TID) | ORAL | 3 refills | Status: AC

## 2024-01-20 NOTE — Progress Notes (Signed)
 Established Patient Office Visit   Subjective  Patient ID: Cheyenne [name], female    DOB: [birth year]  Age: 43 y.o. MRN: [id number]  Chief Complaint  Patient presents with   Annual Exam    CPE; no concerns    Patient is a 43 year old female seen for CP.  Patient recently seen by Ortho for right hip pain 2/2 OA.  Initial steroid injection worked for a while.  Recent injection cause increased pain immediately after and is not working like previous injection.  Patient would like to avoid surgery if possible.  Was on gabapentin for other conditions but found it helpful for hip pain.  Has noticed overall increase in joint pain with some edema in hands.  Unsure if related to effects of chemo.  Did not cause drowsiness.  Concerned about weight.  Typically likes being outdoors but finds it harder given hip pain.      Patient Active Problem List   Diagnosis Date Noted   Pain in right hip 05/05/2023   Port-A-Cath in place 09/16/2021   Genetic testing 08/31/2021   Malignant neoplasm of lower-inner quadrant of left breast in female, estrogen receptor positive (HCC) 08/17/2021   Anxiety 08/06/2019   Plantar fasciitis 08/06/2019   Fluid collection (edema) in the arms, legs, hands and feet 05/02/2018   Past Medical History:  Diagnosis Date   Anal fissure    Anxiety    Breast cancer (HCC) 08/10/21   Date diagnosis was given   Complication of anesthesia 03/16/2022   sore on top of mouth   DVT (deep venous thrombosis) (HCC)    History of radiation therapy    Left breast- 05/05/22- 06/20/22- Dr. Retta Caster   Incomplete right bundle branch block (RBBB) determined by electrocardiography 09/01/2021   Personal history of radiation therapy    Past Surgical History:  Procedure Laterality Date   BREAST LUMPECTOMY     BREAST LUMPECTOMY WITH RADIOACTIVE SEED AND SENTINEL LYMPH NODE BIOPSY Left 03/16/2022   Procedure: LEFT BREAST SEED LUMPECTOMY, LEFT SENTINEL LYMPH NODE MAPPING;  Surgeon:  Sim Dryer, MD;  Location: [facility];  Service: General;  Laterality: Left;  GEN & PEC BLOCK   PORT-A-CATH REMOVAL N/A 07/20/2022   Procedure: REMOVAL PORT-A-CATH;  Surgeon: Sim Dryer, MD;  Location: [facility];  Service: General;  Laterality: N/A;   PORTACATH PLACEMENT N/A 09/08/2021   Procedure: INSERTION PORT-A-CATH;  Surgeon: Sim Dryer, MD;  Location: [facility];  Service: General;  Laterality: N/A;   Social History   Tobacco Use   Smoking status: Never   Smokeless tobacco: Never  Vaping Use   Vaping status: Never Used  Substance Use Topics   Alcohol use: Yes    Comment: Occasionally   Drug use: No   Family History  Problem Relation Age of Onset   Hypertension Mother    Edema Mother    Diabetes Mother    Obesity Mother    Diabetes Sister    Obesity Sister    Breast cancer Other 52   Breast cancer Other 10       dx. with breast cancer again at age 31 (unsure if recurrence or second primary)   Colon cancer Neg Hx    Esophageal cancer Neg Hx    Rectal cancer Neg Hx    No Known Allergies  ROS Negative unless stated above    Objective:     BP 122/78   Pulse 91   Temp 97.7 F (36.5 C)  Ht 5' 1 (1.549 m)   Wt 224 lb (101.6 kg)   SpO2 99%   BMI 42.32 kg/m  BP Readings from Last 3 Encounters:  01/20/24 122/78  10/28/23 (!) 100/50  04/20/23 114/71   Wt Readings from Last 3 Encounters:  01/20/24 224 lb (101.6 kg)  10/28/23 217 lb 6 oz (98.6 kg)  04/20/23 209 lb 9.6 oz (95.1 kg)      Physical Exam Constitutional:      Appearance: Normal appearance.  HENT:     Head: Normocephalic and atraumatic.     Right Ear: Tympanic membrane, ear canal and external ear normal.     Left Ear: Tympanic membrane, ear canal and external ear normal.     Nose: Nose normal.     Mouth/Throat:     Mouth: Mucous membranes are moist.     Pharynx: No oropharyngeal exudate or posterior oropharyngeal erythema.   Eyes:     General: No  scleral icterus.    Extraocular Movements: Extraocular movements intact.     Conjunctiva/sclera: Conjunctivae normal.     Pupils: Pupils are equal, round, and reactive to light.   Neck:     Thyroid : No thyromegaly.   Cardiovascular:     Rate and Rhythm: Normal rate and regular rhythm.     Pulses: Normal pulses.     Heart sounds: Normal heart sounds. No murmur heard.    No friction rub.  Pulmonary:     Effort: Pulmonary effort is normal.     Breath sounds: Normal breath sounds. No wheezing, rhonchi or rales.  Abdominal:     General: Bowel sounds are normal.     Palpations: Abdomen is soft.     Tenderness: There is no abdominal tenderness.   Musculoskeletal:        General: No deformity.     Right hip: Decreased range of motion.     Left hip: Normal.  Lymphadenopathy:     Cervical: No cervical adenopathy.   Skin:    General: Skin is warm and dry.     Findings: No lesion.   Neurological:     General: No focal deficit present.     Mental Status: She is alert and oriented to person, place, and time.   Psychiatric:        Mood and Affect: Mood normal.        Thought Content: Thought content normal.        01/20/2024    1:48 PM 10/19/2018    3:06 PM 09/01/2015    8:52 AM  Depression screen PHQ 2/9  Decreased Interest 0 0 0  Down, Depressed, Hopeless 1 0 0  PHQ - 2 Score 1 0 0  Altered sleeping 0    Tired, decreased energy 0    Change in appetite 1    Feeling bad or failure about yourself  0    Trouble concentrating 1    Moving slowly or fidgety/restless 0    Suicidal thoughts 0    PHQ-9 Score 3    Difficult doing work/chores Not difficult at all        01/20/2024    1:48 PM  GAD 7 : Generalized Anxiety Score  Nervous, Anxious, on Edge 2  Worry too much - different things 1  Trouble relaxing 0  Restless 0  Easily annoyed or irritable 1  Afraid - awful might happen 0  Anxiety Difficulty Somewhat difficult     No results found for any visits on  01/20/24.    Assessment & Plan:   Well adult exam -     CBC with Differential/Platelet; Future -     Comprehensive metabolic panel with GFR; Future -     Hemoglobin A1c; Future -     Lipid panel; Future -     T4, free; Future -     TSH; Future  Need for hepatitis C screening test -     Hepatitis C antibody  Osteoarthritis of right hip, unspecified osteoarthritis type -     CBC with Differential/Platelet; Future  Malignant neoplasm of lower-inner quadrant of left breast in female, estrogen receptor positive (HCC) -     Vitamin B12; Future -     VITAMIN D  25 Hydroxy (Vit-D Deficiency, Fractures); Future  Other orders -     Gabapentin; Take 1 capsule (100 mg total) by mouth 3 (three) times daily.  Dispense: 90 capsule; Refill: 3   Age-appropriate health screenings discussed.  Obtain labs.  Continue follow-up with OB/GYN for Pap.  Continue follow-up with oncology and [facility] for history of breast cancer.  Continue follow-up with Ortho for right hip pain.  Discussed supportive care including stretching, topical analgesics such as Voltaren  gel, heat, water aerobics, chair exercises.  Return if symptoms worsen or fail to improve.   Viola Greulich, MD

## 2024-01-21 LAB — HEPATITIS C ANTIBODY: Hepatitis C Ab: NONREACTIVE

## 2024-01-30 NOTE — Telephone Encounter (Signed)
 It appears your potassium was stable in September 2024.  Not sure if there have been any changes in meds since then but certain medicines can cause a decrease in potassium.  Also if your magnesium has been low that would make it difficult to get the potassium back up.  Can recheck potassium level with magnesium in the next few weeks.

## 2024-02-17 ENCOUNTER — Other Ambulatory Visit: Payer: Self-pay | Admitting: Sports Medicine

## 2024-02-22 DIAGNOSIS — M9904 Segmental and somatic dysfunction of sacral region: Secondary | ICD-10-CM | POA: Diagnosis not present

## 2024-02-22 DIAGNOSIS — M25551 Pain in right hip: Secondary | ICD-10-CM | POA: Diagnosis not present

## 2024-02-22 DIAGNOSIS — M9906 Segmental and somatic dysfunction of lower extremity: Secondary | ICD-10-CM | POA: Diagnosis not present

## 2024-02-22 DIAGNOSIS — M25552 Pain in left hip: Secondary | ICD-10-CM | POA: Diagnosis not present

## 2024-02-22 DIAGNOSIS — M162 Bilateral osteoarthritis resulting from hip dysplasia: Secondary | ICD-10-CM | POA: Diagnosis not present

## 2024-02-22 DIAGNOSIS — M9905 Segmental and somatic dysfunction of pelvic region: Secondary | ICD-10-CM | POA: Diagnosis not present

## 2024-03-18 ENCOUNTER — Other Ambulatory Visit: Payer: Self-pay | Admitting: Sports Medicine

## 2024-04-12 ENCOUNTER — Telehealth: Payer: Self-pay | Admitting: Sports Medicine

## 2024-04-12 ENCOUNTER — Other Ambulatory Visit: Payer: Self-pay | Admitting: Sports Medicine

## 2024-04-12 MED ORDER — MELOXICAM 15 MG PO TABS: 15.0000 mg | ORAL_TABLET | Freq: Every day | ORAL | 0 refills | Status: DC

## 2024-04-12 NOTE — Telephone Encounter (Signed)
Prescription refill sent.

## 2024-04-12 NOTE — Telephone Encounter (Signed)
 Patient called. She would like a refill on meloxicam 

## 2024-04-16 DIAGNOSIS — N9089 Other specified noninflammatory disorders of vulva and perineum: Secondary | ICD-10-CM | POA: Diagnosis not present

## 2024-04-16 DIAGNOSIS — B3731 Acute candidiasis of vulva and vagina: Secondary | ICD-10-CM | POA: Diagnosis not present

## 2024-04-18 DIAGNOSIS — N9089 Other specified noninflammatory disorders of vulva and perineum: Secondary | ICD-10-CM | POA: Diagnosis not present

## 2024-04-27 ENCOUNTER — Telehealth: Payer: Self-pay | Admitting: Adult Health

## 2024-04-27 ENCOUNTER — Encounter: Payer: Self-pay | Admitting: Family Medicine

## 2024-04-27 NOTE — Telephone Encounter (Signed)
 Pt aware that no RF will be sent until her appt, has not been since 09/2021.

## 2024-04-27 NOTE — Telephone Encounter (Signed)
 Patient called in for refill on Xanax  0.5mg . Ph: [phone] Appt 10/16 [facility]acy [facility] [address] [place]

## 2024-05-17 ENCOUNTER — Encounter: Payer: Self-pay | Admitting: Adult Health

## 2024-05-17 ENCOUNTER — Ambulatory Visit (INDEPENDENT_AMBULATORY_CARE_PROVIDER_SITE_OTHER): Admitting: Adult Health

## 2024-05-17 DIAGNOSIS — F411 Generalized anxiety disorder: Secondary | ICD-10-CM

## 2024-05-17 NOTE — Progress Notes (Signed)
 Cheyenne [name] [id number] 1981-02-06 43 y.o.  Subjective:   Patient ID:  Cheyenne [name] is a 43 y.o. (DOB [birth year]) female.  Chief Complaint: No chief complaint on file.   HPI Cheyenne [name] presents to the office today for follow-up of GAD.  Describes mood today as ok. Pleasant. Mood symptoms - denies depression. Reports ok interest and motivation. Reports feeling anxious and irritable at times. Denies panic attacks. Denies rumination. Reports some worry and over thinking. Has been taking Xanax  0.5mg  daily while at work and feels it is helpful, but does not last all day - works from home. Taking medications as prescribed.  Energy levels lower. Active, does not have a regular exercise routine. Working in the yard. Enjoys some usual interests and activities. Married 10 years. No children. Has a dog - Lala. Spending time with family. Appetite adequate. Weight gain - 206 to 220 pounds.  Sleeps well most nights. Averages 7 hours - all together. Reports some late afternoon napping Reports focus and concentration difficulties - chemo. Completing tasks. Managing aspects of household. Works full time for [employer] - 15 years. Denies SI or HI.  Denies AH or VH. Denies self harm. Denies substance use.  Previous medication trials:  Xanax    PHQ2-9    Flowsheet Row Office Visit from 01/20/2024 in [facility] [place] Office Visit from 10/19/2018 in Primary Care at [facility] Visit from 09/01/2015 in Primary Care at [facility] Visit from 04/22/2015 in Primary Care at [facility] Visit from 12/24/2014 in Primary Care at [facility] Total Score 1 0 0 0 0  PHQ-9 Total Score 3 -- -- -- --   Flowsheet Row Admission (Discharged) from 07/20/2022 in [facility] Admission (Discharged) from 03/16/2022 in [facility] ED from 12/02/2021 in [facility] Emergency Department at [facility]  C-SSRS RISK CATEGORY No Risk No Risk No Risk     Review of  Systems:  Review of Systems  Musculoskeletal:  Negative for gait problem.  Neurological:  Negative for tremors.  Psychiatric/Behavioral:         Please refer to HPI    Medications: I have reviewed the patient's current medications.  Current Outpatient Medications  Medication Sig Dispense Refill   acetaminophen  (TYLENOL ) 500 MG tablet Take 500-1,000 mg by mouth every 6 (six) hours as needed (pain.).     albuterol  (VENTOLIN  HFA) 108 (90 Base) MCG/ACT inhaler Inhale 2 puffs into the lungs every 4 (four) hours as needed for wheezing. 1 each 5   ALPRAZolam  (XANAX ) 0.5 MG tablet Take one tablet twice daily as needed for anxiety. 45 tablet 2   cetirizine  (ZYRTEC ) 10 MG tablet Take 10 mg by mouth as needed.     cyclobenzaprine  (FLEXERIL ) 10 MG tablet TAKE 1 TABLET BY MOUTH DAILY AS NEEDED FOR MUSCLE SPASMS. 30 tablet 0   desonide  (DESOWEN ) 0.05 % cream Apply topically 2 (two) times daily. (Patient taking differently: Apply 1 application  topically 2 (two) times daily as needed (skin irritation.).) 30 g 1   desonide  (DESOWEN ) 0.05 % lotion Apply 1 application. topically daily as needed (acne).     diltiazem  2 % GEL Apply 1 Application topically 3 (three) times daily. Diltiazem  2% apply three times daily for 8 weeks as instructed 30 g 0   diphenhydrAMINE  (BENADRYL ) 25 mg capsule Take 25 mg by mouth as needed.     EPINEPHrine  0.3 mg/0.3 mL IJ SOAJ injection Inject 0.3 mg into the muscle as needed for anaphylaxis. (  Patient not taking: Reported on 01/20/2024) 1 each 0   gabapentin  (NEURONTIN ) 100 MG capsule Take 1 capsule (100 mg total) by mouth 3 (three) times daily. 90 capsule 3   hydroquinone  4 % cream APPLY TO DARK SPOTS AT BEDTIME 28.35 g 0   ibuprofen  (ADVIL ) 800 MG tablet Take 1 tablet (800 mg total) by mouth every 8 (eight) hours as needed. 30 tablet 0   loratadine (CLARITIN) 10 MG tablet Take 10 mg by mouth daily.     meloxicam  (MOBIC ) 15 MG tablet Take 1 tablet (15 mg total) by mouth daily. 30  tablet 0   Multiple Vitamins-Minerals (ONE A DAY IMMUNITY DEFENSE) CHEW Chew 1 tablet by mouth daily.     potassium chloride  SA (KLOR-CON  M) 20 MEQ tablet Take 1 tablet (20 mEq total) by mouth 2 (two) times daily.     potassium chloride  SA (KLOR-CON  M) 20 MEQ tablet Take 1 tablet (20 mEq total) by mouth daily for 3 days. 3 tablet 0   No current facility-administered medications for this visit.    Medication Side Effects: None  Allergies: No Known Allergies  Past Medical History:  Diagnosis Date   Anal fissure    Anxiety    Breast cancer (HCC) 08/10/21   Date diagnosis was given   Complication of anesthesia 03/16/2022   sore on top of mouth   DVT (deep venous thrombosis) (HCC)    History of radiation therapy    Left breast- 05/05/22- 06/20/22- Dr. Lynwood Nasuti   Incomplete right bundle branch block (RBBB) determined by electrocardiography 09/01/2021   Personal history of radiation therapy     Past Medical History, Surgical history, Social history, and Family history were reviewed and updated as appropriate.   Please see review of systems for further details on the patient's review from today.   Objective:   Physical Exam:  There were no vitals taken for this visit.  Physical Exam Constitutional:      General: She is not in acute distress. Musculoskeletal:        General: No deformity.  Neurological:     Mental Status: She is alert and oriented to person, place, and time.     Coordination: Coordination normal.  Psychiatric:        Attention and Perception: Attention and perception normal. She does not perceive auditory or visual hallucinations.        Mood and Affect: Mood normal. Mood is not anxious or depressed. Affect is not labile, blunt, angry or inappropriate.        Speech: Speech normal.        Behavior: Behavior normal.        Thought Content: Thought content normal. Thought content is not paranoid or delusional. Thought content does not include homicidal or  suicidal ideation. Thought content does not include homicidal or suicidal plan.        Cognition and Memory: Cognition and memory normal.        Judgment: Judgment normal.     Comments: Insight intact     Lab Review:     Component Value Date/Time   NA 138 01/20/2024 1347   K 3.3 (L) 01/20/2024 1347   CL 99 01/20/2024 1347   CO2 29 01/20/2024 1347   GLUCOSE 90 01/20/2024 1347   BUN 12 01/20/2024 1347   CREATININE 0.74 01/20/2024 1347   CREATININE 0.87 04/20/2023 1036   CREATININE 0.88 04/02/2020 0913   CALCIUM 9.7 01/20/2024 1347   PROT 7.9 01/20/2024 1347  ALBUMIN 4.5 01/20/2024 1347   AST 21 01/20/2024 1347   AST 17 04/20/2023 1036   ALT 20 01/20/2024 1347   ALT 15 04/20/2023 1036   ALKPHOS 85 01/20/2024 1347   BILITOT 0.8 01/20/2024 1347   BILITOT 0.4 04/20/2023 1036   GFRNONAA >60 04/20/2023 1036   GFRNONAA 83 04/02/2020 0913   GFRAA 97 04/02/2020 0913       Component Value Date/Time   WBC 8.5 01/20/2024 1347   RBC 4.10 01/20/2024 1347   HGB 12.5 01/20/2024 1347   HGB 11.9 (L) 04/20/2023 1036   HCT 37.2 01/20/2024 1347   PLT 312.0 01/20/2024 1347   PLT 294 04/20/2023 1036   MCV 90.9 01/20/2024 1347   MCV 89.6 04/10/2013 1337   MCH 31.1 04/20/2023 1036   MCHC 33.6 01/20/2024 1347   RDW 13.5 01/20/2024 1347   LYMPHSABS 2.8 01/20/2024 1347   MONOABS 0.5 01/20/2024 1347   EOSABS 0.1 01/20/2024 1347   BASOSABS 0.0 01/20/2024 1347    No results found for: POCLITH, LITHIUM   No results found for: PHENYTOIN, PHENOBARB, VALPROATE, CBMZ   .res Assessment: Plan:    Plan:  PDMP reviewed  Continue Xanax  0.5mg  twice daily for prn anxiety  RTC 1 year - will call in 3 months for a refill.  15 minutes spent dedicated to the care of this patient on the date of this encounter to include pre-visit review of records, ordering of medication, post visit documentation, and face-to-face time with the patient discussing GAD. Discussed continuing current  medication regimen.   Patient advised to contact office with any questions, adverse effects, or acute worsening in signs and symptoms.  Discussed potential benefits, risk, and side effects of benzodiazepines to include potential risk of tolerance and dependence, as well as possible drowsiness.  Advised patient not to drive if experiencing drowsiness and to take lowest possible effective dose to minimize risk of dependence and tolerance.   There are no diagnoses linked to this encounter.   Please see After Visit Summary for patient specific instructions.  Future Appointments  Date Time Provider Department Center  07/06/2024  8:00 AM Tevis, Saddie SAUNDERS, PT [facility] None    No orders of the defined types were placed in this encounter.   -------------------------------

## 2024-05-18 ENCOUNTER — Other Ambulatory Visit: Payer: Self-pay

## 2024-05-21 ENCOUNTER — Other Ambulatory Visit: Payer: Self-pay

## 2024-05-21 ENCOUNTER — Telehealth: Payer: Self-pay | Admitting: Adult Health

## 2024-05-21 DIAGNOSIS — F411 Generalized anxiety disorder: Secondary | ICD-10-CM

## 2024-05-21 MED ORDER — ALPRAZOLAM 0.5 MG PO TABS: ORAL_TABLET | ORAL | 1 refills | Status: AC

## 2024-05-21 NOTE — Telephone Encounter (Signed)
 Pended

## 2024-05-21 NOTE — Telephone Encounter (Signed)
 Pt had appt last week and Xanax  didnt get called in    [facility] [id number]

## 2024-06-04 ENCOUNTER — Encounter: Payer: Self-pay | Admitting: Radiology

## 2024-06-08 ENCOUNTER — Other Ambulatory Visit: Payer: Self-pay | Admitting: Sports Medicine

## 2024-06-08 ENCOUNTER — Telehealth: Payer: Self-pay | Admitting: Sports Medicine

## 2024-06-08 DIAGNOSIS — N9089 Other specified noninflammatory disorders of vulva and perineum: Secondary | ICD-10-CM | POA: Diagnosis not present

## 2024-06-08 MED ORDER — MELOXICAM 15 MG PO TABS: 15.0000 mg | ORAL_TABLET | Freq: Every day | ORAL | 0 refills | Status: DC

## 2024-06-08 NOTE — Telephone Encounter (Signed)
 Patient called and said that she needed a handicap form and a refill on Meloxicam . CB#[phone]

## 2024-07-06 ENCOUNTER — Encounter: Payer: Self-pay | Admitting: Rehabilitation

## 2024-07-06 ENCOUNTER — Ambulatory Visit: Attending: Hematology | Admitting: Rehabilitation

## 2024-07-06 DIAGNOSIS — C50312 Malignant neoplasm of lower-inner quadrant of left female breast: Secondary | ICD-10-CM | POA: Insufficient documentation

## 2024-07-06 DIAGNOSIS — Z483 Aftercare following surgery for neoplasm: Secondary | ICD-10-CM | POA: Insufficient documentation

## 2024-07-06 DIAGNOSIS — Z170 Estrogen receptor positive status [ER+]: Secondary | ICD-10-CM | POA: Insufficient documentation

## 2024-07-06 NOTE — Therapy (Signed)
 OUTPATIENT PHYSICAL THERAPY SOZO SCREENING NOTE   Patient Name: Cheyenne [name] MRN: [id number] DOB:1981-05-01, 43 y.o., female Today's Date: 07/06/2024  PCP: Mercer Clotilda SAUNDERS, MD REFERRING PROVIDER: Lanny Callander, MD   PT End of Session - 07/06/24 [phone]     Visit Number 4   screen only   PT Start Time 0802    PT Stop Time 0810    PT Time Calculation (min) 8 min    Activity Tolerance Patient tolerated treatment well    Behavior During Therapy [facility] for tasks assessed/performed          Past Medical History:  Diagnosis Date   Anal fissure    Anxiety    Breast cancer (HCC) 08/10/21   Date diagnosis was given   Complication of anesthesia 03/16/2022   sore on top of mouth   DVT (deep venous thrombosis) (HCC)    History of radiation therapy    Left breast- 05/05/22- 06/20/22- Dr. Lynwood Nasuti   Incomplete right bundle branch block (RBBB) determined by electrocardiography 09/01/2021   Personal history of radiation therapy    Past Surgical History:  Procedure Laterality Date   BREAST LUMPECTOMY     BREAST LUMPECTOMY WITH RADIOACTIVE SEED AND SENTINEL LYMPH NODE BIOPSY Left 03/16/2022   Procedure: LEFT BREAST SEED LUMPECTOMY, LEFT SENTINEL LYMPH NODE MAPPING;  Surgeon: Vanderbilt Ned, MD;  Location: [facility];  Service: General;  Laterality: Left;  GEN & PEC BLOCK   PORT-A-CATH REMOVAL N/A 07/20/2022   Procedure: REMOVAL PORT-A-CATH;  Surgeon: Vanderbilt Ned, MD;  Location: [facility];  Service: General;  Laterality: N/A;   PORTACATH PLACEMENT N/A 09/08/2021   Procedure: INSERTION PORT-A-CATH;  Surgeon: Vanderbilt Ned, MD;  Location: [facility];  Service: General;  Laterality: N/A;   Patient Active Problem List   Diagnosis Date Noted   Pain in right hip 05/05/2023   Port-A-Cath in place 09/16/2021   Genetic testing 08/31/2021   Malignant neoplasm of lower-inner quadrant of left breast in female, estrogen receptor positive (HCC) 08/17/2021   Anxiety  08/06/2019   Plantar fasciitis 08/06/2019   Fluid collection (edema) in the arms, legs, hands and feet 05/02/2018    REFERRING DIAG: left breast cancer at risk for lymphedema  THERAPY DIAG:  Malignant neoplasm of lower-inner quadrant of left breast in female, estrogen receptor positive (HCC)  Aftercare following surgery for neoplasm  PERTINENT HISTORY:  Patient was diagnosed on 08/07/2021 with left grade III invasive ductal carcinoma breast cancer. It measures 2.8 cm and is located in the lower inner quadrant. It is weakly (2%) ER positive, PR and HER2 negative with a Ki67 of 40%. 03/16/22- L breast seed lumpectomy and SLNB 0/2. Had post op seroma with drainage from inframammary incision as well as an additional one inferior to axillary incision   PRECAUTIONS: left UE Lymphedema risk,   SUBJECTIVE:   PAIN:  Are you having pain? No  SOZO SCREENING: Patient was assessed today using the SOZO machine to determine the lymphedema index score. This was compared to her baseline score. It was determined that she is within the recommended range when compared to her baseline and no further action is needed at this time. She will continue SOZO screenings. These are done every 3 months for 2 years post operatively followed by every 6 months for 2 years, and then annually.   Due to number going back up to almost yellow, pt will get a new sleeve.  She does not remember which ones she  has from before so she will let me know what it is to get a matching garment to the hand pieces C:  19 E: 33 G: 42  Addendum 07/10/24: Will order sleeve bella strong 7Reg through [facility] [employer].     Cheyenne [name], PT 07/06/2024, 8:47 AM

## 2024-07-12 DIAGNOSIS — N9089 Other specified noninflammatory disorders of vulva and perineum: Secondary | ICD-10-CM | POA: Diagnosis not present

## 2024-07-27 ENCOUNTER — Encounter: Payer: Self-pay | Admitting: Family Medicine

## 2024-07-27 DIAGNOSIS — E876 Hypokalemia: Secondary | ICD-10-CM

## 2024-08-02 ENCOUNTER — Other Ambulatory Visit: Payer: Self-pay | Admitting: Sports Medicine

## 2024-08-17 ENCOUNTER — Other Ambulatory Visit: Payer: Self-pay

## 2024-08-20 ENCOUNTER — Encounter: Payer: Self-pay | Admitting: Sports Medicine

## 2024-08-22 ENCOUNTER — Encounter: Payer: Self-pay | Admitting: Sports Medicine

## 2024-08-22 NOTE — Progress Notes (Signed)
" ° ° °  This letter is for the patient: Cheyenne [name], [birth year]  To whom it may concern:  I have seen and evaluated [name] for her bilateral hips. Given her chronic orthopedic condition, she would benefit from the use of assistive devices to ambulate, dress, and take on/off her shoes. Items such as Sock-aid device kit, leg lifter, and a cane or supportive walking device may be required to help with daily function and improve her pain with activities. Please consider these to help with her chronic hip condition.  If any questions, please contact the office. [phone].  Lonell Sprang, DO Primary Care Sports Medicine Physician  [facility] Orth[facility]  "

## 2024-08-31 ENCOUNTER — Encounter: Payer: Self-pay | Admitting: Family Medicine

## 2024-09-07 ENCOUNTER — Other Ambulatory Visit: Payer: Self-pay | Admitting: Sports Medicine

## 2024-10-26 ENCOUNTER — Ambulatory Visit: Attending: Hematology | Admitting: Rehabilitation

## 2025-05-17 ENCOUNTER — Ambulatory Visit: Admitting: Adult Health
